# Patient Record
Sex: Female | Born: 1940
Health system: Southern US, Community
[De-identification: ages and names within clinical notes are randomized; demographics above are authoritative.]

## PROBLEM LIST (undated history)

## (undated) DIAGNOSIS — K219 Gastro-esophageal reflux disease without esophagitis: Secondary | ICD-10-CM

## (undated) DIAGNOSIS — I499 Cardiac arrhythmia, unspecified: Secondary | ICD-10-CM

## (undated) DIAGNOSIS — I350 Nonrheumatic aortic (valve) stenosis: Secondary | ICD-10-CM

## (undated) DIAGNOSIS — R7303 Prediabetes: Secondary | ICD-10-CM

## (undated) DIAGNOSIS — R112 Nausea with vomiting, unspecified: Secondary | ICD-10-CM

## (undated) DIAGNOSIS — I4819 Other persistent atrial fibrillation: Secondary | ICD-10-CM

## (undated) DIAGNOSIS — R011 Cardiac murmur, unspecified: Secondary | ICD-10-CM

## (undated) DIAGNOSIS — E039 Hypothyroidism, unspecified: Secondary | ICD-10-CM

## (undated) DIAGNOSIS — I10 Essential (primary) hypertension: Secondary | ICD-10-CM

## (undated) DIAGNOSIS — Z9889 Other specified postprocedural states: Secondary | ICD-10-CM

## (undated) DIAGNOSIS — I4891 Unspecified atrial fibrillation: Secondary | ICD-10-CM

## (undated) DIAGNOSIS — E079 Disorder of thyroid, unspecified: Secondary | ICD-10-CM

## (undated) DIAGNOSIS — T8859XA Other complications of anesthesia, initial encounter: Secondary | ICD-10-CM

## (undated) DIAGNOSIS — K449 Diaphragmatic hernia without obstruction or gangrene: Secondary | ICD-10-CM

## (undated) DIAGNOSIS — R519 Headache, unspecified: Secondary | ICD-10-CM

## (undated) DIAGNOSIS — M1990 Unspecified osteoarthritis, unspecified site: Secondary | ICD-10-CM

## (undated) DIAGNOSIS — R0600 Dyspnea, unspecified: Secondary | ICD-10-CM

## (undated) HISTORY — DX: Essential (primary) hypertension: I10

## (undated) HISTORY — PX: CARDIAC CATHETERIZATION: SHX172

## (undated) HISTORY — DX: Disorder of thyroid, unspecified: E07.9

## (undated) HISTORY — PX: TOE SURGERY: SHX1073

## (undated) HISTORY — DX: Unspecified atrial fibrillation: I48.91

## (undated) HISTORY — PX: TUBAL LIGATION: SHX77

## (undated) HISTORY — PX: EYE SURGERY: SHX253

---

## 1984-03-21 HISTORY — PX: TUBAL LIGATION: SHX77

## 1998-07-14 ENCOUNTER — Other Ambulatory Visit: Admission: RE | Admit: 1998-07-14 | Discharge: 1998-07-14 | Payer: Self-pay | Admitting: Family Medicine

## 1999-01-14 ENCOUNTER — Encounter: Admission: RE | Admit: 1999-01-14 | Discharge: 1999-01-14 | Payer: Self-pay | Admitting: Family Medicine

## 1999-01-14 ENCOUNTER — Encounter: Payer: Self-pay | Admitting: Obstetrics and Gynecology

## 1999-07-21 ENCOUNTER — Other Ambulatory Visit: Admission: RE | Admit: 1999-07-21 | Discharge: 1999-07-21 | Payer: Self-pay | Admitting: Family Medicine

## 2000-01-18 ENCOUNTER — Encounter: Admission: RE | Admit: 2000-01-18 | Discharge: 2000-01-18 | Payer: Self-pay | Admitting: Family Medicine

## 2000-01-18 ENCOUNTER — Encounter: Payer: Self-pay | Admitting: Family Medicine

## 2001-01-18 ENCOUNTER — Encounter: Payer: Self-pay | Admitting: Family Medicine

## 2001-01-18 ENCOUNTER — Encounter: Admission: RE | Admit: 2001-01-18 | Discharge: 2001-01-18 | Payer: Self-pay | Admitting: Family Medicine

## 2001-11-28 ENCOUNTER — Other Ambulatory Visit: Admission: RE | Admit: 2001-11-28 | Discharge: 2001-11-28 | Payer: Self-pay | Admitting: Family Medicine

## 2002-05-20 ENCOUNTER — Encounter: Payer: Self-pay | Admitting: *Deleted

## 2002-05-20 ENCOUNTER — Encounter: Admission: RE | Admit: 2002-05-20 | Discharge: 2002-05-20 | Payer: Self-pay | Admitting: *Deleted

## 2003-01-09 ENCOUNTER — Other Ambulatory Visit: Admission: RE | Admit: 2003-01-09 | Discharge: 2003-01-09 | Payer: Self-pay | Admitting: Family Medicine

## 2003-05-28 ENCOUNTER — Encounter: Admission: RE | Admit: 2003-05-28 | Discharge: 2003-05-28 | Payer: Self-pay | Admitting: Family Medicine

## 2004-01-22 ENCOUNTER — Other Ambulatory Visit: Admission: RE | Admit: 2004-01-22 | Discharge: 2004-01-22 | Payer: Self-pay | Admitting: Family Medicine

## 2004-06-21 ENCOUNTER — Encounter: Admission: RE | Admit: 2004-06-21 | Discharge: 2004-06-21 | Payer: Self-pay | Admitting: Family Medicine

## 2005-06-23 ENCOUNTER — Encounter: Admission: RE | Admit: 2005-06-23 | Discharge: 2005-06-23 | Payer: Self-pay | Admitting: Family Medicine

## 2006-09-21 ENCOUNTER — Encounter: Admission: RE | Admit: 2006-09-21 | Discharge: 2006-09-21 | Payer: Self-pay | Admitting: Family Medicine

## 2006-09-29 ENCOUNTER — Encounter: Admission: RE | Admit: 2006-09-29 | Discharge: 2006-09-29 | Payer: Self-pay | Admitting: Family Medicine

## 2007-10-01 ENCOUNTER — Encounter: Admission: RE | Admit: 2007-10-01 | Discharge: 2007-10-01 | Payer: Self-pay

## 2008-06-05 LAB — HM COLONOSCOPY: HM Colonoscopy: NORMAL

## 2008-09-16 ENCOUNTER — Encounter: Admission: RE | Admit: 2008-09-16 | Discharge: 2008-09-16 | Payer: Self-pay | Admitting: Orthopedic Surgery

## 2008-09-29 ENCOUNTER — Encounter: Admission: RE | Admit: 2008-09-29 | Discharge: 2008-12-18 | Payer: Self-pay | Admitting: Orthopedic Surgery

## 2008-11-20 ENCOUNTER — Encounter: Admission: RE | Admit: 2008-11-20 | Discharge: 2008-11-20 | Payer: Self-pay | Admitting: Family Medicine

## 2009-12-28 ENCOUNTER — Encounter: Admission: RE | Admit: 2009-12-28 | Discharge: 2009-12-28 | Payer: Self-pay | Admitting: Family Medicine

## 2010-04-11 ENCOUNTER — Encounter: Payer: Self-pay | Admitting: Family Medicine

## 2010-06-11 DIAGNOSIS — E039 Hypothyroidism, unspecified: Secondary | ICD-10-CM | POA: Insufficient documentation

## 2010-06-11 DIAGNOSIS — Z780 Asymptomatic menopausal state: Secondary | ICD-10-CM | POA: Insufficient documentation

## 2010-06-11 DIAGNOSIS — K5790 Diverticulosis of intestine, part unspecified, without perforation or abscess without bleeding: Secondary | ICD-10-CM | POA: Insufficient documentation

## 2011-01-28 ENCOUNTER — Other Ambulatory Visit: Payer: Self-pay | Admitting: Family Medicine

## 2011-01-28 ENCOUNTER — Other Ambulatory Visit: Payer: Self-pay | Admitting: Nurse Practitioner

## 2011-01-28 DIAGNOSIS — Z1231 Encounter for screening mammogram for malignant neoplasm of breast: Secondary | ICD-10-CM

## 2011-02-11 ENCOUNTER — Ambulatory Visit
Admission: RE | Admit: 2011-02-11 | Discharge: 2011-02-11 | Disposition: A | Discharge status: Home / Self Care | Payer: Medicare Other | Source: Ambulatory Visit | Attending: Family Medicine | Admitting: Family Medicine

## 2011-02-11 DIAGNOSIS — Z1231 Encounter for screening mammogram for malignant neoplasm of breast: Secondary | ICD-10-CM

## 2011-04-07 DIAGNOSIS — L259 Unspecified contact dermatitis, unspecified cause: Secondary | ICD-10-CM | POA: Diagnosis not present

## 2011-11-17 DIAGNOSIS — Z961 Presence of intraocular lens: Secondary | ICD-10-CM | POA: Diagnosis not present

## 2011-11-17 DIAGNOSIS — H43819 Vitreous degeneration, unspecified eye: Secondary | ICD-10-CM | POA: Diagnosis not present

## 2011-11-17 DIAGNOSIS — D3130 Benign neoplasm of unspecified choroid: Secondary | ICD-10-CM | POA: Diagnosis not present

## 2011-11-17 DIAGNOSIS — H3530 Unspecified macular degeneration: Secondary | ICD-10-CM | POA: Diagnosis not present

## 2012-02-29 DIAGNOSIS — E039 Hypothyroidism, unspecified: Secondary | ICD-10-CM | POA: Diagnosis not present

## 2012-02-29 DIAGNOSIS — K219 Gastro-esophageal reflux disease without esophagitis: Secondary | ICD-10-CM | POA: Diagnosis not present

## 2012-02-29 DIAGNOSIS — E559 Vitamin D deficiency, unspecified: Secondary | ICD-10-CM | POA: Diagnosis not present

## 2012-02-29 DIAGNOSIS — I10 Essential (primary) hypertension: Secondary | ICD-10-CM | POA: Diagnosis not present

## 2012-05-07 DIAGNOSIS — M25569 Pain in unspecified knee: Secondary | ICD-10-CM | POA: Diagnosis not present

## 2012-05-07 DIAGNOSIS — IMO0002 Reserved for concepts with insufficient information to code with codable children: Secondary | ICD-10-CM | POA: Diagnosis not present

## 2012-06-13 ENCOUNTER — Other Ambulatory Visit: Payer: Self-pay

## 2012-06-13 DIAGNOSIS — Z1231 Encounter for screening mammogram for malignant neoplasm of breast: Secondary | ICD-10-CM

## 2012-07-06 ENCOUNTER — Ambulatory Visit
Admission: RE | Admit: 2012-07-06 | Discharge: 2012-07-06 | Disposition: A | Discharge status: Home / Self Care | Payer: Medicare Other | Source: Ambulatory Visit

## 2012-07-06 DIAGNOSIS — Z1231 Encounter for screening mammogram for malignant neoplasm of breast: Secondary | ICD-10-CM | POA: Diagnosis not present

## 2012-11-29 DIAGNOSIS — D3130 Benign neoplasm of unspecified choroid: Secondary | ICD-10-CM | POA: Diagnosis not present

## 2012-11-29 DIAGNOSIS — H02839 Dermatochalasis of unspecified eye, unspecified eyelid: Secondary | ICD-10-CM | POA: Diagnosis not present

## 2012-11-29 DIAGNOSIS — H3530 Unspecified macular degeneration: Secondary | ICD-10-CM | POA: Diagnosis not present

## 2012-11-29 DIAGNOSIS — Z961 Presence of intraocular lens: Secondary | ICD-10-CM | POA: Diagnosis not present

## 2013-02-24 ENCOUNTER — Other Ambulatory Visit: Payer: Self-pay | Admitting: Family Medicine

## 2013-02-26 ENCOUNTER — Telehealth: Payer: Self-pay | Admitting: General Practice

## 2013-03-05 ENCOUNTER — Telehealth: Payer: Self-pay | Admitting: General Practice

## 2013-03-05 ENCOUNTER — Encounter (INDEPENDENT_AMBULATORY_CARE_PROVIDER_SITE_OTHER): Payer: Self-pay

## 2013-03-05 ENCOUNTER — Encounter: Payer: Self-pay | Admitting: General Practice

## 2013-03-05 ENCOUNTER — Ambulatory Visit (INDEPENDENT_AMBULATORY_CARE_PROVIDER_SITE_OTHER): Payer: Medicare Other | Admitting: General Practice

## 2013-03-05 VITALS — BP 145/77 | HR 89 | Temp 97.2°F | Ht 64.0 in | Wt 172.0 lb

## 2013-03-05 DIAGNOSIS — E039 Hypothyroidism, unspecified: Secondary | ICD-10-CM | POA: Diagnosis not present

## 2013-03-05 DIAGNOSIS — I10 Essential (primary) hypertension: Secondary | ICD-10-CM

## 2013-03-05 DIAGNOSIS — H60543 Acute eczematoid otitis externa, bilateral: Secondary | ICD-10-CM

## 2013-03-05 DIAGNOSIS — H60509 Unspecified acute noninfective otitis externa, unspecified ear: Secondary | ICD-10-CM

## 2013-03-05 DIAGNOSIS — Z09 Encounter for follow-up examination after completed treatment for conditions other than malignant neoplasm: Secondary | ICD-10-CM

## 2013-03-05 DIAGNOSIS — K219 Gastro-esophageal reflux disease without esophagitis: Secondary | ICD-10-CM

## 2013-03-05 LAB — POCT CBC
HCT, POC: 43.7 % (ref 37.7–47.9)
Hemoglobin: 14.7 g/dL (ref 12.2–16.2)
Lymph, poc: 2.1 (ref 0.6–3.4)
MCV: 88.1 fL (ref 80–97)
POC Granulocyte: 5.7 (ref 2–6.9)
Platelet Count, POC: 350 10*3/uL (ref 142–424)
RDW, POC: 12.9 %

## 2013-03-05 MED ORDER — BENAZEPRIL-HYDROCHLOROTHIAZIDE 20-12.5 MG PO TABS: 1.0000 | ORAL_TABLET | Freq: Every day | ORAL | Status: DC

## 2013-03-05 MED ORDER — MOMETASONE FUROATE 0.1 % EX CREA: 1.0000 "application " | TOPICAL_CREAM | Freq: Every day | CUTANEOUS | Status: DC

## 2013-03-05 MED ORDER — RANITIDINE HCL 150 MG PO TABS: 150.0000 mg | ORAL_TABLET | Freq: Two times a day (BID) | ORAL | Status: DC

## 2013-03-05 NOTE — Telephone Encounter (Signed)
Please advise 

## 2013-03-05 NOTE — Patient Instructions (Signed)

## 2013-03-05 NOTE — Progress Notes (Signed)
   Subjective:    Patient ID: Autumn Moyer, female    DOB: 22-Jan-1941, 72 y.o.   MRN: [id number]  HPI Patient presents today for chronic health follow up. She has a history of hypothyroidism, hypertension, gerd, and eczema. She reports taking medications as prescribed. Reports eating a regular diet and denies exercise. Reports having a history of hyperlipidemia, but refuses to take any lipid lowering medications (due to side effects). Refuses to have lipid panel.     Review of Systems  Constitutional: Negative for fever and chills.  Respiratory: Negative for chest tightness and shortness of breath.   Cardiovascular: Negative for chest pain and palpitations.  Gastrointestinal: Negative for nausea, vomiting, abdominal pain, diarrhea, constipation and blood in stool.  Genitourinary: Negative for dysuria and difficulty urinating.  Musculoskeletal:       Arthritic pain  Neurological: Negative for dizziness, weakness and headaches.       Objective:   Physical Exam  Constitutional: She is oriented to person, place, and time. She appears well-developed and well-nourished.  HENT:  Head: Normocephalic and atraumatic.  Right Ear: External ear normal.  Left Ear: External ear normal.  Nose: Nose normal.  Mouth/Throat: Oropharynx is clear and moist.  Eyes: EOM are normal. Pupils are equal, round, and reactive to light.  Neck: Normal range of motion. Neck supple. No thyromegaly present.  Cardiovascular: Normal rate, regular rhythm and normal heart sounds.   Pulmonary/Chest: Effort normal and breath sounds normal. No respiratory distress. She exhibits no tenderness.  Abdominal: Soft. Bowel sounds are normal. She exhibits no distension. There is no tenderness.  Lymphadenopathy:    She has no cervical adenopathy.  Neurological: She is alert and oriented to person, place, and time.  Skin: Skin is warm and dry.  Psychiatric: She has a normal mood and affect.          Assessment & Plan:  1.  Eczema of external ear, bilateral  - mometasone (ELOCON) 0.1 % cream; Apply 1 application topically daily. Apply to affected area  Dispense: 45 g; Refill: 2  2. GERD (gastroesophageal reflux disease)  - ranitidine (ZANTAC) 150 MG tablet; Take 1 tablet (150 mg total) by mouth 2 (two) times daily.  Dispense: 60 tablet; Refill: 6  3. Hypertension  - benazepril-hydrochlorthiazide (LOTENSIN HCT) 20-12.5 MG per tablet; Take 1 tablet by mouth daily.  Dispense: 30 tablet; Refill: 3 - CMP14+EGFR  4. Hypothyroidism  - Thyroid Panel With TSH  5. Follow-up exam, 3-6 months since previous exam  - POCT CBC -Continue all current medications Labs pending F/u in 3 months Discussed benefits of regular exercise and healthy eating Patient verbalized understanding Coralie Keens, FNP-C

## 2013-03-06 LAB — CMP14+EGFR
Albumin/Globulin Ratio: 1.7 (ref 1.1–2.5)
Albumin: 4.7 g/dL (ref 3.5–4.8)
BUN/Creatinine Ratio: 19 (ref 11–26)
CO2: 24 mmol/L (ref 18–29)
Calcium: 9.9 mg/dL (ref 8.6–10.2)
Chloride: 99 mmol/L (ref 97–108)
Creatinine, Ser: 0.85 mg/dL (ref 0.57–1.00)
GFR calc Af Amer: 80 mL/min/{1.73_m2} (ref >59)
Potassium: 4.7 mmol/L (ref 3.5–5.2)
Total Bilirubin: 0.8 mg/dL (ref 0.0–1.2)

## 2013-03-06 LAB — THYROID PANEL WITH TSH: T4, Total: 7.3 ug/dL (ref 4.5–12.0)

## 2013-03-08 ENCOUNTER — Other Ambulatory Visit: Payer: Self-pay | Admitting: General Practice

## 2013-03-08 DIAGNOSIS — H60543 Acute eczematoid otitis externa, bilateral: Secondary | ICD-10-CM

## 2013-03-08 MED ORDER — MOMETASONE FUROATE 0.1 % EX CREA: 1.0000 "application " | TOPICAL_CREAM | Freq: Every day | CUTANEOUS | Status: DC

## 2013-03-08 NOTE — Telephone Encounter (Signed)
Script sent to pharmacy.

## 2013-03-11 ENCOUNTER — Telehealth: Payer: Self-pay | Admitting: General Practice

## 2013-03-12 NOTE — Telephone Encounter (Signed)
Labs resulted.

## 2013-03-13 NOTE — Telephone Encounter (Signed)
Called in thyroid meds to [facility],  Six month supply.  Pt wants elocon, non generic, but insurance will not pay.   Cost would be $112 for brand name. Patient aware.

## 2013-03-13 NOTE — Telephone Encounter (Signed)
Message copied by Almeta Monas on Wed Mar 13, 2013  9:10 AM ------      Message from: Coralie Keens      Created: Tue Mar 12, 2013  3:52 PM       Please inform all labs within normal limits. ------

## 2013-05-07 DIAGNOSIS — R209 Unspecified disturbances of skin sensation: Secondary | ICD-10-CM | POA: Diagnosis not present

## 2013-05-07 DIAGNOSIS — L570 Actinic keratosis: Secondary | ICD-10-CM | POA: Diagnosis not present

## 2013-05-07 DIAGNOSIS — D485 Neoplasm of uncertain behavior of skin: Secondary | ICD-10-CM | POA: Diagnosis not present

## 2013-05-07 DIAGNOSIS — D239 Other benign neoplasm of skin, unspecified: Secondary | ICD-10-CM | POA: Diagnosis not present

## 2013-06-16 DIAGNOSIS — H579 Unspecified disorder of eye and adnexa: Secondary | ICD-10-CM | POA: Diagnosis not present

## 2013-06-26 ENCOUNTER — Other Ambulatory Visit: Payer: Self-pay | Admitting: *Deleted

## 2013-06-26 DIAGNOSIS — I10 Essential (primary) hypertension: Secondary | ICD-10-CM

## 2013-06-26 MED ORDER — BENAZEPRIL-HYDROCHLOROTHIAZIDE 20-12.5 MG PO TABS: 1.0000 | ORAL_TABLET | Freq: Every day | ORAL | Status: DC

## 2013-06-26 NOTE — Telephone Encounter (Signed)
Last ov 12/14. 

## 2013-08-05 ENCOUNTER — Other Ambulatory Visit: Payer: Self-pay

## 2013-08-05 DIAGNOSIS — Z1231 Encounter for screening mammogram for malignant neoplasm of breast: Secondary | ICD-10-CM

## 2013-08-06 ENCOUNTER — Ambulatory Visit
Admission: RE | Admit: 2013-08-06 | Discharge: 2013-08-06 | Disposition: A | Discharge status: Home / Self Care | Payer: Medicare Other | Source: Ambulatory Visit

## 2013-08-06 DIAGNOSIS — Z1231 Encounter for screening mammogram for malignant neoplasm of breast: Secondary | ICD-10-CM

## 2013-09-02 ENCOUNTER — Other Ambulatory Visit: Payer: Self-pay | Admitting: General Practice

## 2013-10-21 ENCOUNTER — Other Ambulatory Visit: Payer: Self-pay | Admitting: General Practice

## 2013-10-23 DIAGNOSIS — C4492 Squamous cell carcinoma of skin, unspecified: Secondary | ICD-10-CM

## 2013-10-23 DIAGNOSIS — L821 Other seborrheic keratosis: Secondary | ICD-10-CM | POA: Diagnosis not present

## 2013-10-23 DIAGNOSIS — D485 Neoplasm of uncertain behavior of skin: Secondary | ICD-10-CM | POA: Diagnosis not present

## 2013-10-23 DIAGNOSIS — K140 Glossitis: Secondary | ICD-10-CM | POA: Diagnosis not present

## 2013-10-23 DIAGNOSIS — D0460 Carcinoma in situ of skin of unspecified upper limb, including shoulder: Secondary | ICD-10-CM | POA: Diagnosis not present

## 2013-10-23 HISTORY — DX: Squamous cell carcinoma of skin, unspecified: C44.92

## 2013-10-24 DIAGNOSIS — H1045 Other chronic allergic conjunctivitis: Secondary | ICD-10-CM | POA: Diagnosis not present

## 2013-10-28 DIAGNOSIS — J029 Acute pharyngitis, unspecified: Secondary | ICD-10-CM | POA: Diagnosis not present

## 2013-11-07 DIAGNOSIS — D0460 Carcinoma in situ of skin of unspecified upper limb, including shoulder: Secondary | ICD-10-CM | POA: Diagnosis not present

## 2013-11-13 DIAGNOSIS — T148XXA Other injury of unspecified body region, initial encounter: Secondary | ICD-10-CM | POA: Diagnosis not present

## 2013-11-13 DIAGNOSIS — L089 Local infection of the skin and subcutaneous tissue, unspecified: Secondary | ICD-10-CM | POA: Diagnosis not present

## 2013-11-15 ENCOUNTER — Ambulatory Visit (INDEPENDENT_AMBULATORY_CARE_PROVIDER_SITE_OTHER): Payer: Medicare Other | Admitting: Family Medicine

## 2013-11-15 VITALS — BP 128/74 | HR 86 | Temp 98.1°F | Ht 64.0 in | Wt 178.4 lb

## 2013-11-15 DIAGNOSIS — K2100 Gastro-esophageal reflux disease with esophagitis, without bleeding: Secondary | ICD-10-CM | POA: Diagnosis not present

## 2013-11-15 DIAGNOSIS — E039 Hypothyroidism, unspecified: Secondary | ICD-10-CM | POA: Diagnosis not present

## 2013-11-15 DIAGNOSIS — I10 Essential (primary) hypertension: Secondary | ICD-10-CM

## 2013-11-15 DIAGNOSIS — K219 Gastro-esophageal reflux disease without esophagitis: Secondary | ICD-10-CM | POA: Insufficient documentation

## 2013-11-15 MED ORDER — BENAZEPRIL-HYDROCHLOROTHIAZIDE 20-12.5 MG PO TABS: ORAL_TABLET | ORAL | Status: DC

## 2013-11-15 MED ORDER — RANITIDINE HCL 150 MG PO TABS: ORAL_TABLET | ORAL | Status: DC

## 2013-11-15 MED ORDER — LEVOTHYROXINE SODIUM 100 MCG PO TABS: ORAL_TABLET | ORAL | Status: DC

## 2013-11-15 NOTE — Patient Instructions (Signed)
You are doing great overall.  Please continue taking your medications as prescribed and try to find your trigger foods for your reflux. Have a blessed weekend

## 2013-11-15 NOTE — Assessment & Plan Note (Signed)
Minimal esophagitis and no signs of significant esophageal disease.  No need for scope Continue H2 blocker prn, pt to try to better identify triggers F/u prn

## 2013-11-15 NOTE — Assessment & Plan Note (Signed)
Well controlled.  No sign of hypotension BMET today Refill Lisinopril and HCTZ Consider decreasing in future if remains at current level - per PCP

## 2013-11-15 NOTE — Assessment & Plan Note (Signed)
TSH today Will refill thyroxine base on results FUll thyroid panel in 02/2013 nml

## 2013-11-15 NOTE — Progress Notes (Signed)
Autumn Moyer is a 73 y.o. female who presents to [facility] today for medication refills  Thyroid: Denies CP, palpitations, dry skin, cold or heat intolerance, neck swelling. Taking levothyroxine  Ranitidine: takes nightly at supper. Occasionally takes BID as prescribed. occasinoally will try not taking it at all. Symptoms come and go. occasinally at night when lying down and after some meals. Unsure of dietary triggers. Denies dysphagia, unintentional wt loss, fevers, hematemesis or hematochezia/melana  HTN: takes lotensinHCT daily. Denies CP, palpitations, SOB, syncope, lightheaded.    The following portions of the patient's history were reviewed and updated as appropriate: allergies, current medications, past medical history, family and social history, and problem list.  Patient is a nonsmoker.   Past Medical History  Diagnosis Date  . Hypertension   . Thyroid disease     ROS as above otherwise neg.    Medications reviewed. Current Outpatient Prescriptions  Medication Sig Dispense Refill  . benazepril-hydrochlorthiazide (LOTENSIN HCT) 20-12.5 MG per tablet TAKE ONE TABLET BY MOUTH ONCE DAILY  90 tablet  3  . Iron Combinations (IRON COMPLEX PO) Take by mouth.      . levothyroxine (SYNTHROID, LEVOTHROID) 100 MCG tablet TAKE ONE TABLET BY MOUTH ONCE DAILY IN THE MORNING ON  AN  EMPTY  STOMACH  90 tablet  1  . mometasone (ELOCON) 0.1 % cream Apply 1 application topically daily.  45 g  0  . Multiple Vitamins-Minerals (ICAPS MV PO) Take by mouth.      . pseudoephedrine (DECONGESTANT 12HOUR MAX ST) 120 MG 12 hr tablet Take 120 mg by mouth 2 (two) times daily.      . ranitidine (ZANTAC) 150 MG tablet TAKE ONE TABLET BY MOUTH TWICE DAILY AS NEEDED  60 tablet  6  . Triprolidine-Pseudoephedrine (ANTIHISTAMINE PO) Take 10 mg by mouth daily.      . mupirocin ointment (BACTROBAN) 2 %        No current facility-administered medications for this visit.    Exam: BP 128/74  Pulse 86  Temp(Src)  98.1 F (36.7 C) (Oral)  Ht 5\' 4"  (1.626 m)  Wt 178 lb 6.4 oz (80.922 kg)  BMI 30.61 kg/m2 Gen: Well NAD HEENT: EOMI,  MMM Lungs: CTABL Nl WOB Heart: RRR no MRG Abd: NABS, NT, ND Exts: Non edematous BL  LE, warm and well perfused.   No results found for this or any previous visit (from the past 72 hour(s)).  A/P (as seen in Problem list)  Essential hypertension, benign Well controlled.  No sign of hypotension BMET today Refill Lisinopril and HCTZ Consider decreasing in future if remains at current level - per PCP  Hypothyroid TSH today Will refill thyroxine base on results FUll thyroid panel in 02/2013 nml  Esophageal reflux Minimal esophagitis and no signs of significant esophageal disease.  No need for scope Continue H2 blocker prn, pt to try to better identify triggers F/u prn

## 2013-11-16 LAB — BASIC METABOLIC PANEL
BUN/Creatinine Ratio: 18 (ref 11–26)
BUN: 17 mg/dL (ref 8–27)
CO2: 24 mmol/L (ref 18–29)
Calcium: 9.8 mg/dL (ref 8.7–10.3)
Chloride: 101 mmol/L (ref 97–108)
Creatinine, Ser: 0.93 mg/dL (ref 0.57–1.00)
GFR calc Af Amer: 72 mL/min/{1.73_m2} (ref >59)
GFR, EST NON AFRICAN AMERICAN: 62 mL/min/{1.73_m2} (ref >59)
Glucose: 103 mg/dL — ABNORMAL HIGH (ref 65–99)
POTASSIUM: 4.6 mmol/L (ref 3.5–5.2)
SODIUM: 142 mmol/L (ref 134–144)

## 2013-11-16 LAB — TSH: TSH: 0.497 u[IU]/mL (ref 0.450–4.500)

## 2013-12-11 DIAGNOSIS — L570 Actinic keratosis: Secondary | ICD-10-CM | POA: Diagnosis not present

## 2013-12-11 DIAGNOSIS — L819 Disorder of pigmentation, unspecified: Secondary | ICD-10-CM | POA: Diagnosis not present

## 2013-12-11 DIAGNOSIS — D485 Neoplasm of uncertain behavior of skin: Secondary | ICD-10-CM | POA: Diagnosis not present

## 2014-03-26 DIAGNOSIS — L570 Actinic keratosis: Secondary | ICD-10-CM | POA: Diagnosis not present

## 2014-03-26 DIAGNOSIS — L910 Hypertrophic scar: Secondary | ICD-10-CM | POA: Diagnosis not present

## 2014-06-11 DIAGNOSIS — H3531 Nonexudative age-related macular degeneration: Secondary | ICD-10-CM | POA: Diagnosis not present

## 2014-06-11 DIAGNOSIS — D3131 Benign neoplasm of right choroid: Secondary | ICD-10-CM | POA: Diagnosis not present

## 2014-06-11 DIAGNOSIS — H52203 Unspecified astigmatism, bilateral: Secondary | ICD-10-CM | POA: Diagnosis not present

## 2014-06-11 DIAGNOSIS — H10413 Chronic giant papillary conjunctivitis, bilateral: Secondary | ICD-10-CM | POA: Diagnosis not present

## 2014-08-05 ENCOUNTER — Other Ambulatory Visit: Payer: Self-pay

## 2014-08-05 DIAGNOSIS — Z1231 Encounter for screening mammogram for malignant neoplasm of breast: Secondary | ICD-10-CM

## 2014-08-15 ENCOUNTER — Ambulatory Visit (INDEPENDENT_AMBULATORY_CARE_PROVIDER_SITE_OTHER): Payer: Medicare Other | Admitting: Physician Assistant

## 2014-08-15 ENCOUNTER — Encounter: Payer: Self-pay | Admitting: Physician Assistant

## 2014-08-15 VITALS — BP 156/81 | HR 92 | Temp 97.4°F | Ht 64.0 in | Wt 179.0 lb

## 2014-08-15 DIAGNOSIS — E039 Hypothyroidism, unspecified: Secondary | ICD-10-CM | POA: Diagnosis not present

## 2014-08-15 DIAGNOSIS — K2100 Gastro-esophageal reflux disease with esophagitis, without bleeding: Secondary | ICD-10-CM

## 2014-08-15 DIAGNOSIS — Z0000 Encounter for general adult medical examination without abnormal findings: Secondary | ICD-10-CM

## 2014-08-15 DIAGNOSIS — R010 Benign and innocent cardiac murmurs: Secondary | ICD-10-CM

## 2014-08-15 DIAGNOSIS — Z13220 Encounter for screening for lipoid disorders: Secondary | ICD-10-CM

## 2014-08-15 DIAGNOSIS — R011 Cardiac murmur, unspecified: Secondary | ICD-10-CM

## 2014-08-15 DIAGNOSIS — I10 Essential (primary) hypertension: Secondary | ICD-10-CM

## 2014-08-15 LAB — POCT CBC
Granulocyte percent: 57.8 %G (ref 37–80)
HCT, POC: 42.1 % (ref 37.7–47.9)
Hemoglobin: 13.9 g/dL (ref 12.2–16.2)
LYMPH, POC: 2.4 (ref 0.6–3.4)
MCH: 28.7 pg (ref 27–31.2)
MCHC: 33 g/dL (ref 31.8–35.4)
MCV: 87 fL (ref 80–97)
MPV: 7.4 fL (ref 0–99.8)
POC Granulocyte: 4.3 (ref 2–6.9)
POC LYMPH PERCENT: 32.2 %L (ref 10–50)
Platelet Count, POC: 337 10*3/uL (ref 142–424)
RBC: 4.84 M/uL (ref 4.04–5.48)
RDW, POC: 12.6 %
WBC: 7.4 10*3/uL (ref 4.6–10.2)

## 2014-08-15 MED ORDER — RANITIDINE HCL 150 MG PO TABS: ORAL_TABLET | ORAL | Status: DC

## 2014-08-15 MED ORDER — LEVOTHYROXINE SODIUM 100 MCG PO TABS: ORAL_TABLET | ORAL | Status: DC

## 2014-08-15 MED ORDER — BENAZEPRIL-HYDROCHLOROTHIAZIDE 20-12.5 MG PO TABS: ORAL_TABLET | ORAL | Status: DC

## 2014-08-15 NOTE — Progress Notes (Signed)
   Subjective:    Patient ID: Autumn Moyer, female    DOB: 1940-09-10, 74 y.o.   MRN: [id number]  HPI73 y/o female needs refills for medications.     Review of Systems  Constitutional: Negative.   Respiratory: Positive for shortness of breath (with activity ).   Cardiovascular: Negative.   Endocrine: Positive for heat intolerance. Negative for polydipsia, polyphagia and polyuria.  Genitourinary: Negative.   Musculoskeletal: Negative.   Neurological: Negative.   Hematological: Negative.   Psychiatric/Behavioral: Negative.        Objective:   Physical Exam  Constitutional: She appears well-developed and well-nourished.  HENT:  Head: Normocephalic.  Neck: No thyromegaly present.  Cardiovascular: Normal rate, regular rhythm and intact distal pulses.  Exam reveals no gallop and no friction rub.   Murmur (consistent , systolic murmur noted on exam. Patient has no history of this ) heard. Pulmonary/Chest: Effort normal and breath sounds normal. No respiratory distress. She has no wheezes.  Musculoskeletal: Normal range of motion. She exhibits no edema or tenderness.  Psychiatric: She has a normal mood and affect. Her behavior is normal. Judgment and thought content normal.  Nursing note and vitals reviewed.         Assessment & Plan:  1. Essential hypertension, benign  - POCT CBC - CMP14+EGFR - benazepril-hydrochlorthiazide (LOTENSIN HCT) 20-12.5 MG per tablet; TAKE ONE TABLET BY MOUTH ONCE DAILY  Dispense: 90 tablet; Refill: 5  2. Hypothyroidism, unspecified hypothyroidism type  - Thyroid Panel With TSH - levothyroxine (SYNTHROID, LEVOTHROID) 100 MCG tablet; TAKE ONE TABLET BY MOUTH ONCE DAILY IN THE MORNING ON  AN  EMPTY  STOMACH  Dispense: 90 tablet; Refill: 1  3. Gastroesophageal reflux disease with esophagitis  - ranitidine (ZANTAC) 150 MG tablet; TAKE ONE TABLET BY MOUTH TWICE DAILY AS NEEDED  Dispense: 60 tablet; Refill: 5  Patient does not want lipid screening  because she is not going to take medication regardless    6. Undiagnosed cardiac murmurs - Patient has no history of this dx. Very audible in all areas of auscultation on anterior chest and more prominent with leaning forward. Needs further eval  - Ambulatory referral to Cardiology   Continue all meds Labs pending Health Maintenance reviewed Diet and exercise encouraged RTO 6 months   Tammala Weider A. Benjamin Stain PA-C

## 2014-08-16 LAB — CMP14+EGFR
A/G RATIO: 1.6 (ref 1.1–2.5)
ALK PHOS: 61 IU/L (ref 39–117)
ALT: 15 IU/L (ref 0–32)
AST: 20 IU/L (ref 0–40)
Albumin: 4.4 g/dL (ref 3.5–4.8)
BILIRUBIN TOTAL: 0.7 mg/dL (ref 0.0–1.2)
BUN / CREAT RATIO: 19 (ref 11–26)
BUN: 17 mg/dL (ref 8–27)
CHLORIDE: 99 mmol/L (ref 97–108)
CO2: 24 mmol/L (ref 18–29)
CREATININE: 0.91 mg/dL (ref 0.57–1.00)
Calcium: 9.6 mg/dL (ref 8.7–10.3)
GFR calc Af Amer: 73 mL/min/{1.73_m2} (ref >59)
GFR calc non Af Amer: 64 mL/min/{1.73_m2} (ref >59)
GLUCOSE: 105 mg/dL — AB (ref 65–99)
Globulin, Total: 2.7 g/dL (ref 1.5–4.5)
Potassium: 4.5 mmol/L (ref 3.5–5.2)
SODIUM: 139 mmol/L (ref 134–144)
TOTAL PROTEIN: 7.1 g/dL (ref 6.0–8.5)

## 2014-08-16 LAB — THYROID PANEL WITH TSH
FREE THYROXINE INDEX: 3.1 (ref 1.2–4.9)
T3 UPTAKE RATIO: 31 % (ref 24–39)
T4, Total: 9.9 ug/dL (ref 4.5–12.0)
TSH: 0.544 u[IU]/mL (ref 0.450–4.500)

## 2014-08-26 ENCOUNTER — Telehealth: Payer: Self-pay | Admitting: Physician Assistant

## 2014-08-28 NOTE — Telephone Encounter (Signed)
Talked to patient. This was not meant for me. The confusion was about  Her appointment times at [facility], which has been resolved. Greyson Peavy A. Benjamin Stain PA-C

## 2014-09-05 ENCOUNTER — Ambulatory Visit (INDEPENDENT_AMBULATORY_CARE_PROVIDER_SITE_OTHER): Payer: Medicare Other | Admitting: Cardiology

## 2014-09-05 ENCOUNTER — Encounter: Payer: Self-pay | Admitting: Cardiology

## 2014-09-05 VITALS — BP 158/92 | HR 94 | Ht 64.0 in | Wt 180.0 lb

## 2014-09-05 DIAGNOSIS — R011 Cardiac murmur, unspecified: Secondary | ICD-10-CM | POA: Insufficient documentation

## 2014-09-05 DIAGNOSIS — I10 Essential (primary) hypertension: Secondary | ICD-10-CM | POA: Diagnosis not present

## 2014-09-05 NOTE — Progress Notes (Signed)
Cardiology Office Note   Date:  09/05/2014   ID:  VALETA PAZ, DOB 1940/09/02, MRN [id number]  PCP:  Redge Gainer, MD  Cardiologist:   Minus Breeding, MD   Chief Complaint  Patient presents with  . Heart Murmur      History of Present Illness: Autumn Moyer is a 74 y.o. female who presents for evaluation of a heart murmur. She has no prior cardiac history. She does report a stress test some years ago. She somewhat active climbing stairs frequently to her basement laundry. She might get a little winded with this but this is mild. She can recover quickly. She does not have any resting shortness of breath. She doesn't have any PND or orthopnea. She denies any palpitations, presyncope or syncope. She has no chest pressure, neck or arm discomfort. She was recently noted on routine exam to have a heart murmur.   Past Medical History  Diagnosis Date  . Hypertension   . Thyroid disease     Past Surgical History  Procedure Laterality Date  . Eye surgery    . Tubal ligation    . Toe surgery       Current Outpatient Prescriptions  Medication Sig Dispense Refill  . benazepril-hydrochlorthiazide (LOTENSIN HCT) 20-12.5 MG per tablet TAKE ONE TABLET BY MOUTH ONCE DAILY 90 tablet 5  . Iron Combinations (IRON COMPLEX PO) Take by mouth.    . levothyroxine (SYNTHROID, LEVOTHROID) 100 MCG tablet TAKE ONE TABLET BY MOUTH ONCE DAILY IN THE MORNING ON  AN  EMPTY  STOMACH 90 tablet 1  . mometasone (ELOCON) 0.1 % cream Apply 1 application topically daily. 45 g 0  . Multiple Vitamins-Minerals (ICAPS MV PO) Take by mouth.    . mupirocin ointment (BACTROBAN) 2 %     . pseudoephedrine (DECONGESTANT 12HOUR MAX ST) 120 MG 12 hr tablet Take 120 mg by mouth 2 (two) times daily.    . ranitidine (ZANTAC) 150 MG tablet TAKE ONE TABLET BY MOUTH TWICE DAILY AS NEEDED 60 tablet 5  . Triprolidine-Pseudoephedrine (ANTIHISTAMINE PO) Take 10 mg by mouth daily.     No current facility-administered  medications for this visit.    Allergies:   Noroxin    Social History:  The patient  reports that she has never smoked. She has never used smokeless tobacco. She reports that she does not drink alcohol or use illicit drugs.   Family History:  The patient's family history includes Heart disease in her father; Heart failure in her mother; Hyperlipidemia in her father and mother; Hypertension in her father, mother, and sister; Lung cancer in her father.    ROS:  Please see the history of present illness.   Otherwise, review of systems are positive for hot natured.   All other systems are reviewed and negative.    PHYSICAL EXAM: VS:  BP 158/92 mmHg  Pulse 94  Ht 5\' 4"  (1.626 m)  Wt 180 lb (81.647 kg)  BMI 30.88 kg/m2  SpO2 93% , BMI Body mass index is 30.88 kg/(m^2). GENERAL:  Well appearing HEENT:  Pupils equal round and reactive, fundi not visualized, oral mucosa unremarkable NECK:  No jugular venous distention, waveform within normal limits, carotid upstroke brisk and symmetric, no bruits, no thyromegaly LYMPHATICS:  No cervical, inguinal adenopathy LUNGS:  Clear to auscultation bilaterally BACK:  No CVA tenderness CHEST:  Unremarkable HEART:  PMI not displaced or sustained,S1 and S2 within normal limits, no S3, no S4, no clicks, no rubs,  2 out of 6 apical systolic murmur radiating out the aortic outflow tract and early to mid peaking, no diastolic murmurs ABD:  Flat, positive bowel sounds normal in frequency in pitch, no bruits, no rebound, no guarding, no midline pulsatile mass, no hepatomegaly, no splenomegaly EXT:  2 plus pulses throughout, no edema, no cyanosis no clubbing SKIN:  No rashes no nodules NEURO:  Cranial nerves II through XII grossly intact, motor grossly intact throughout PSYCH:  Cognitively intact, oriented to person place and time    EKG:  EKG is ordered today. The ekg ordered today demonstrates sinus rhythm, rate 89, axis within normal limits, intervals  within normal limits, nonspecific T-wave flattening.   Recent Labs: 08/15/2014: ALT 15; BUN 17; Creatinine, Ser 0.91; Hemoglobin 13.9; Potassium 4.5; Sodium 139; TSH 0.544    Lipid Panel No results found for: CHOL, TRIG, HDL, CHOLHDL, VLDL, LDLCALC, LDLDIRECT    Wt Readings from Last 3 Encounters:  09/05/14 180 lb (81.647 kg)  08/15/14 179 lb (81.194 kg)  11/15/13 178 lb 6.4 oz (80.922 kg)      Other studies Reviewed: Additional studies/ records that were reviewed today include: Labs. Review of the above records demonstrates:  Please see elsewhere in the note.     ASSESSMENT AND PLAN:  MURMUR:   I suspect some mild aortic stenosis. There is no dynamic change to this murmur so I don't think his hypertrophic cardiomyopathy. I will evaluate with an echocardiogram. I suspect this will be followed clinically.    HTN:  The blood pressure is at target. No change in medications is indicated. We will continue with therapeutic lifestyle changes (TLC).   Current medicines are reviewed at length with the patient today.  The patient does not have concerns regarding medicines.  The following changes have been made:  no change  Labs/ tests ordered today include:  Orders Placed This Encounter  Procedures  . EKG 12-Lead echo     Disposition:   FU with me in two years.     Signed, Minus Breeding, MD  09/05/2014 12:00 PM    [place]

## 2014-09-05 NOTE — Patient Instructions (Signed)
Your physician has requested that you have an echocardiogram. Echocardiography is a painless test that uses sound waves to create images of your heart. It provides your doctor with information about the size and shape of your heart and how well your heart's chambers and valves are working. This procedure takes approximately one hour. There are no restrictions for this procedure. Office will contact with results via phone or letter.   Continue all current medications. Your physician wants you to follow up in:  2 years.  You will receive a reminder letter in the mail one-two months in advance.  If you don't receive a letter, please call our office to schedule the follow up appointment

## 2014-09-16 ENCOUNTER — Ambulatory Visit
Admission: RE | Admit: 2014-09-16 | Discharge: 2014-09-16 | Disposition: A | Discharge status: Home / Self Care | Payer: Medicare Other | Source: Ambulatory Visit

## 2014-09-16 DIAGNOSIS — Z1231 Encounter for screening mammogram for malignant neoplasm of breast: Secondary | ICD-10-CM | POA: Diagnosis not present

## 2014-09-16 DIAGNOSIS — D239 Other benign neoplasm of skin, unspecified: Secondary | ICD-10-CM | POA: Diagnosis not present

## 2014-09-16 DIAGNOSIS — L570 Actinic keratosis: Secondary | ICD-10-CM | POA: Diagnosis not present

## 2014-09-16 DIAGNOSIS — S60450A Superficial foreign body of right index finger, initial encounter: Secondary | ICD-10-CM | POA: Diagnosis not present

## 2014-09-18 ENCOUNTER — Ambulatory Visit (INDEPENDENT_AMBULATORY_CARE_PROVIDER_SITE_OTHER): Payer: Medicare Other

## 2014-09-18 ENCOUNTER — Other Ambulatory Visit: Payer: Self-pay

## 2014-09-18 DIAGNOSIS — R011 Cardiac murmur, unspecified: Secondary | ICD-10-CM

## 2014-11-06 ENCOUNTER — Ambulatory Visit (INDEPENDENT_AMBULATORY_CARE_PROVIDER_SITE_OTHER): Payer: Medicare Other | Admitting: Family

## 2014-11-06 ENCOUNTER — Encounter: Payer: Self-pay | Admitting: Family

## 2014-11-06 VITALS — BP 142/73 | HR 85 | Temp 97.8°F | Ht 64.0 in | Wt 180.0 lb

## 2014-11-06 DIAGNOSIS — Z1839 Other retained organic fragments: Principal | ICD-10-CM

## 2014-11-06 DIAGNOSIS — S40852A Superficial foreign body of left upper arm, initial encounter: Secondary | ICD-10-CM | POA: Diagnosis not present

## 2014-11-06 DIAGNOSIS — S50852A Superficial foreign body of left forearm, initial encounter: Secondary | ICD-10-CM | POA: Diagnosis not present

## 2014-11-06 DIAGNOSIS — I10 Essential (primary) hypertension: Secondary | ICD-10-CM

## 2014-11-06 MED ORDER — BENAZEPRIL-HYDROCHLOROTHIAZIDE 20-25 MG PO TABS: 1.0000 | ORAL_TABLET | Freq: Every day | ORAL | Status: DC

## 2014-11-06 NOTE — Progress Notes (Addendum)
   Subjective:    Patient ID: Autumn Moyer, female    DOB: 04-22-1940, 74 y.o.   MRN: [id number]  Pt presents to the office today for "sore" on her left wrist. Pt states she noticed it yesterday. Pt states she saw it and noticed the black center. Pt denies any pain or drainage from area.  Pt's BP is also elevated today.  Hypertension This is a chronic problem. The current episode started more than 1 year ago. The problem is unchanged. The problem is uncontrolled. Associated symptoms include peripheral edema ("my feet at the end of the day"). Pertinent negatives include no anxiety, headaches, palpitations or shortness of breath. Risk factors for coronary artery disease include post-menopausal state, sedentary lifestyle, obesity and family history. Past treatments include ACE inhibitors and diuretics. There is no history of kidney disease, CAD/MI, CVA, heart failure or a thyroid problem.      Review of Systems  Constitutional: Negative.   HENT: Negative.   Eyes: Negative.   Respiratory: Negative.  Negative for shortness of breath.   Cardiovascular: Negative.  Negative for palpitations.  Gastrointestinal: Negative.   Endocrine: Negative.   Genitourinary: Negative.   Musculoskeletal: Negative.   Neurological: Negative.  Negative for headaches.  Hematological: Negative.   Psychiatric/Behavioral: Negative.   All other systems reviewed and are negative.      Objective:   Physical Exam  Constitutional: She is oriented to person, place, and time. She appears well-developed and well-nourished. No distress.  HENT:  Head: Normocephalic and atraumatic.  Right Ear: External ear normal.  Left Ear: External ear normal.  Nose: Nose normal.  Mouth/Throat: Oropharynx is clear and moist.  Eyes: Pupils are equal, round, and reactive to light.  Neck: Normal range of motion. Neck supple. No thyromegaly present.  Cardiovascular: Normal rate, regular rhythm, normal heart sounds and intact distal  pulses.   No murmur heard. Pulmonary/Chest: Effort normal and breath sounds normal. No respiratory distress. She has no wheezes.  Abdominal: Soft. Bowel sounds are normal. She exhibits no distension. There is no tenderness.  Musculoskeletal: Normal range of motion. She exhibits no edema or tenderness.  Neurological: She is alert and oriented to person, place, and time. She has normal reflexes. No cranial nerve deficit.  Skin: Skin is warm and dry.  Small head of tick  embedded in left forearm   Psychiatric: She has a normal mood and affect. Her behavior is normal. Judgment and thought content normal.  Vitals reviewed.  Embedded tick removed from left forearm    BP 142/73 mmHg  Pulse 85  Temp(Src) 97.8 F (36.6 C) (Oral)  Ht _0  (1.626 m)  Wt 180 lb (81.647 kg)  BMI 30.88 kg/m2     Assessment & Plan:  1. Embedded tick of left upper arm, initial encounter --Pt to report any new fever, joint pain, or rash -Apply antibiotic ointment to area and keep clean and dry -Wear protective clothing while outside- Long sleeves and long pants -Put insect repellent on all exposed skin and along clothing -Take a shower as soon as possible after being outside - BMP8+EGFR  2. Essential hypertension Pt's HCTZ increased to 25 mg from 12.5 mg -Dash diet information given -Exercise encouraged - Stress Management  -Continue current meds -RTO in 2 weeks  - benazepril-hydrochlorthiazide (LOTENSIN HCT) 20-25 MG per tablet; Take 1 tablet by mouth daily.  Dispense: 90 tablet; Refill: 3 - BMP8+EGFR  Evelina Dun, FNP

## 2014-11-06 NOTE — Patient Instructions (Signed)
DASH Eating Plan DASH stands for "Dietary Approaches to Stop Hypertension." The DASH eating plan is a healthy eating plan that has been shown to reduce high blood pressure (hypertension). Additional health benefits may include reducing the risk of type 2 diabetes mellitus, heart disease, and stroke. The DASH eating plan may also help with weight loss. WHAT DO I NEED TO KNOW ABOUT THE DASH EATING PLAN? For the DASH eating plan, you will follow these general guidelines:  Choose foods with a percent daily value for sodium of less than 5% (as listed on the food label).  Use salt-free seasonings or herbs instead of table salt or sea salt.  Check with your health care provider or pharmacist before using salt substitutes.  Eat lower-sodium products, often labeled as "lower sodium" or "no salt added."  Eat fresh foods.  Eat more vegetables, fruits, and low-fat dairy products.  Choose whole grains. Look for the word "whole" as the first word in the ingredient list.  Choose fish and skinless chicken or turkey more often than red meat. Limit fish, poultry, and meat to 6 oz (170 g) each day.  Limit sweets, desserts, sugars, and sugary drinks.  Choose heart-healthy fats.  Limit cheese to 1 oz (28 g) per day.  Eat more home-cooked food and less restaurant, buffet, and fast food.  Limit fried foods.  Cook foods using methods other than frying.  Limit canned vegetables. If you do use them, rinse them well to decrease the sodium.  When eating at a restaurant, ask that your food be prepared with less salt, or no salt if possible. WHAT FOODS CAN I EAT? Seek help from a dietitian for individual calorie needs. Grains Whole grain or whole wheat bread. Brown rice. Whole grain or whole wheat pasta. Quinoa, bulgur, and whole grain cereals. Low-sodium cereals. Corn or whole wheat flour tortillas. Whole grain cornbread. Whole grain crackers. Low-sodium crackers. Vegetables Fresh or frozen vegetables  (raw, steamed, roasted, or grilled). Low-sodium or reduced-sodium tomato and vegetable juices. Low-sodium or reduced-sodium tomato sauce and paste. Low-sodium or reduced-sodium canned vegetables.  Fruits All fresh, canned (in natural juice), or frozen fruits. Meat and Other Protein Products Ground beef (85% or leaner), grass-fed beef, or beef trimmed of fat. Skinless chicken or turkey. Ground chicken or turkey. Pork trimmed of fat. All fish and seafood. Eggs. Dried beans, peas, or lentils. Unsalted nuts and seeds. Unsalted canned beans. Dairy Low-fat dairy products, such as skim or 1% milk, 2% or reduced-fat cheeses, low-fat ricotta or cottage cheese, or plain low-fat yogurt. Low-sodium or reduced-sodium cheeses. Fats and Oils Tub margarines without trans fats. Light or reduced-fat mayonnaise and salad dressings (reduced sodium). Avocado. Safflower, olive, or canola oils. Natural peanut or almond butter. Other Unsalted popcorn and pretzels. The items listed above may not be a complete list of recommended foods or beverages. Contact your dietitian for more options. WHAT FOODS ARE NOT RECOMMENDED? Grains White bread. White pasta. White rice. Refined cornbread. Bagels and croissants. Crackers that contain trans fat. Vegetables Creamed or fried vegetables. Vegetables in a cheese sauce. Regular canned vegetables. Regular canned tomato sauce and paste. Regular tomato and vegetable juices. Fruits Dried fruits. Canned fruit in light or heavy syrup. Fruit juice. Meat and Other Protein Products Fatty cuts of meat. Ribs, chicken wings, bacon, sausage, bologna, salami, chitterlings, fatback, hot dogs, bratwurst, and packaged luncheon meats. Salted nuts and seeds. Canned beans with salt. Dairy Whole or 2% milk, cream, half-and-half, and cream cheese. Whole-fat or sweetened yogurt. Full-fat   cheeses or blue cheese. Nondairy creamers and whipped toppings. Processed cheese, cheese spreads, or cheese  curds. Condiments Onion and garlic salt, seasoned salt, table salt, and sea salt. Canned and packaged gravies. Worcestershire sauce. Tartar sauce. Barbecue sauce. Teriyaki sauce. Soy sauce, including reduced sodium. Steak sauce. Fish sauce. Oyster sauce. Cocktail sauce. Horseradish. Ketchup and mustard. Meat flavorings and tenderizers. Bouillon cubes. Hot sauce. Tabasco sauce. Marinades. Taco seasonings. Relishes. Fats and Oils Butter, stick margarine, lard, shortening, ghee, and bacon fat. Coconut, palm kernel, or palm oils. Regular salad dressings. Other Pickles and olives. Salted popcorn and pretzels. The items listed above may not be a complete list of foods and beverages to avoid. Contact your dietitian for more information. WHERE CAN I FIND MORE INFORMATION? National Heart, Lung, and [employer] Institute: www.nhlbi.nih.gov/health/health-topics/topics/dash/ Document Released: 02/24/2011 Document Revised: 07/22/2013 Document Reviewed: 01/09/2013 ExitCare Patient Information 2015 [employer]. This information is not intended to replace advice given to you by your health care provider. Make sure you discuss any questions you have with your health care provider. Hypertension Hypertension, commonly called high blood pressure, is when the force of blood pumping through your arteries is too strong. Your arteries are the blood vessels that carry blood from your heart throughout your body. A blood pressure reading consists of a higher number over a lower number, such as 110/72. The higher number (systolic) is the pressure inside your arteries when your heart pumps. The lower number (diastolic) is the pressure inside your arteries when your heart relaxes. Ideally you want your blood pressure below 120/80. Hypertension forces your heart to work harder to pump blood. Your arteries may become narrow or stiff. Having hypertension puts you at risk for heart disease, stroke, and other problems.  RISK  FACTORS Some risk factors for high blood pressure are controllable. Others are not.  Risk factors you cannot control include:   Race. You may be at higher risk if you are African American.  Age. Risk increases with age.  Gender. Men are at higher risk than women before age 45 years. After age 65, women are at higher risk than men. Risk factors you can control include:  Not getting enough exercise or physical activity.  Being overweight.  Getting too much fat, sugar, calories, or salt in your diet.  Drinking too much alcohol. SIGNS AND SYMPTOMS Hypertension does not usually cause signs or symptoms. Extremely high blood pressure (hypertensive crisis) may cause headache, anxiety, shortness of breath, and nosebleed. DIAGNOSIS  To check if you have hypertension, your health care provider will measure your blood pressure while you are seated, with your arm held at the level of your heart. It should be measured at least twice using the same arm. Certain conditions can cause a difference in blood pressure between your right and left arms. A blood pressure reading that is higher than normal on one occasion does not mean that you need treatment. If one blood pressure reading is high, ask your health care provider about having it checked again. TREATMENT  Treating high blood pressure includes making lifestyle changes and possibly taking medicine. Living a healthy lifestyle can help lower high blood pressure. You may need to change some of your habits. Lifestyle changes may include:  Following the DASH diet. This diet is high in fruits, vegetables, and whole grains. It is low in salt, red meat, and added sugars.  Getting at least 2 hours of brisk physical activity every week.  Losing weight if necessary.  Not smoking.  Limiting   alcoholic beverages.  Learning ways to reduce stress. If lifestyle changes are not enough to get your blood pressure under control, your health care provider may  prescribe medicine. You may need to take more than one. Work closely with your health care provider to understand the risks and benefits. HOME CARE INSTRUCTIONS  Have your blood pressure rechecked as directed by your health care provider.   Take medicines only as directed by your health care provider. Follow the directions carefully. Blood pressure medicines must be taken as prescribed. The medicine does not work as well when you skip doses. Skipping doses also puts you at risk for problems.   Do not smoke.   Monitor your blood pressure at home as directed by your health care provider. SEEK MEDICAL CARE IF:   You think you are having a reaction to medicines taken.  You have recurrent headaches or feel dizzy.  You have swelling in your ankles.  You have trouble with your vision. SEEK IMMEDIATE MEDICAL CARE IF:  You develop a severe headache or confusion.  You have unusual weakness, numbness, or feel faint.  You have severe chest or abdominal pain.  You vomit repeatedly.  You have trouble breathing. MAKE SURE YOU:   Understand these instructions.  Will watch your condition.  Will get help right away if you are not doing well or get worse. Document Released: 03/07/2005 Document Revised: 07/22/2013 Document Reviewed: 12/28/2012 ExitCare Patient Information 2015 [employer]. This information is not intended to replace advice given to you by your health care provider. Make sure you discuss any questions you have with your health care provider.  

## 2014-11-07 LAB — BMP8+EGFR
BUN/Creatinine Ratio: 16 (ref 11–26)
BUN: 13 mg/dL (ref 8–27)
CALCIUM: 9.4 mg/dL (ref 8.7–10.3)
CHLORIDE: 100 mmol/L (ref 97–108)
CO2: 21 mmol/L (ref 18–29)
Creatinine, Ser: 0.83 mg/dL (ref 0.57–1.00)
GFR calc Af Amer: 82 mL/min/{1.73_m2} (ref >59)
GFR calc non Af Amer: 71 mL/min/{1.73_m2} (ref >59)
GLUCOSE: 90 mg/dL (ref 65–99)
POTASSIUM: 4 mmol/L (ref 3.5–5.2)
Sodium: 141 mmol/L (ref 134–144)

## 2014-11-21 ENCOUNTER — Ambulatory Visit (INDEPENDENT_AMBULATORY_CARE_PROVIDER_SITE_OTHER): Payer: Medicare Other | Admitting: Family

## 2014-11-21 ENCOUNTER — Encounter: Payer: Self-pay | Admitting: Family

## 2014-11-21 ENCOUNTER — Ambulatory Visit: Payer: Medicare Other | Admitting: Physician Assistant

## 2014-11-21 VITALS — BP 120/69 | HR 99 | Temp 97.6°F | Ht 64.0 in | Wt 178.2 lb

## 2014-11-21 DIAGNOSIS — I10 Essential (primary) hypertension: Secondary | ICD-10-CM

## 2014-11-21 NOTE — Progress Notes (Signed)
   Subjective:    Patient ID: Autumn Moyer, female    DOB: February 05, 1941, 74 y.o.   MRN: [id number]  Pt presents to the office today to recheck HTN. Pt's BP is at goal today. PT reports feeling "more tired" since increasing the HCTZ. Pt told to take BP med at night to see if she can tell a difference in her fatigue.  Hypertension This is a chronic problem. The current episode started more than 1 year ago. The problem is unchanged. The problem is uncontrolled. Associated symptoms include peripheral edema ("my feet at the end of the day"). Pertinent negatives include no anxiety, headaches, palpitations or shortness of breath. Risk factors for coronary artery disease include post-menopausal state, sedentary lifestyle, obesity and family history. Past treatments include ACE inhibitors and diuretics. There is no history of kidney disease, CAD/MI, CVA, heart failure or a thyroid problem.      Review of Systems  Constitutional: Negative.   HENT: Negative.   Eyes: Negative.   Respiratory: Negative.  Negative for shortness of breath.   Cardiovascular: Negative.  Negative for palpitations.  Gastrointestinal: Negative.   Endocrine: Negative.   Genitourinary: Negative.   Musculoskeletal: Negative.   Neurological: Negative.  Negative for headaches.  Hematological: Negative.   Psychiatric/Behavioral: Negative.   All other systems reviewed and are negative.      Objective:   Physical Exam  Constitutional: She is oriented to person, place, and time. She appears well-developed and well-nourished. No distress.  HENT:  Head: Normocephalic and atraumatic.  Right Ear: External ear normal.  Left Ear: External ear normal.  Nose: Nose normal.  Mouth/Throat: Oropharynx is clear and moist.  Eyes: Pupils are equal, round, and reactive to light.  Neck: Normal range of motion. Neck supple. No thyromegaly present.  Cardiovascular: Normal rate, regular rhythm, normal heart sounds and intact distal pulses.   No  murmur heard. Pulmonary/Chest: Effort normal and breath sounds normal. No respiratory distress. She has no wheezes.  Abdominal: Soft. Bowel sounds are normal. She exhibits no distension. There is no tenderness.  Musculoskeletal: Normal range of motion. She exhibits no edema or tenderness.  Neurological: She is alert and oriented to person, place, and time. She has normal reflexes. No cranial nerve deficit.  Skin: Skin is warm and dry.  Psychiatric: She has a normal mood and affect. Her behavior is normal. Judgment and thought content normal.  Vitals reviewed.   BP 120/69 mmHg  Pulse 99  Temp(Src) 97.6 F (36.4 C) (Oral)  Ht _0  (1.626 m)  Wt 178 lb 3.2 oz (80.831 kg)  BMI 30.57 kg/m2       Assessment & Plan:  1. Essential hypertension, benign -Dash diet information given -Exercise encouraged - Stress Management  -Continue current meds -RTO in 6 months - BMP8+EGFR   Continue all meds Labs pending Health Maintenance reviewed Diet and exercise encouraged RTO 6 months  Evelina Dun, FNP

## 2014-11-21 NOTE — Patient Instructions (Signed)
DASH Eating Plan DASH stands for "Dietary Approaches to Stop Hypertension." The DASH eating plan is a healthy eating plan that has been shown to reduce high blood pressure (hypertension). Additional health benefits may include reducing the risk of type 2 diabetes mellitus, heart disease, and stroke. The DASH eating plan may also help with weight loss. WHAT DO I NEED TO KNOW ABOUT THE DASH EATING PLAN? For the DASH eating plan, you will follow these general guidelines:  Choose foods with a percent daily value for sodium of less than 5% (as listed on the food label).  Use salt-free seasonings or herbs instead of table salt or sea salt.  Check with your health care provider or pharmacist before using salt substitutes.  Eat lower-sodium products, often labeled as "lower sodium" or "no salt added."  Eat fresh foods.  Eat more vegetables, fruits, and low-fat dairy products.  Choose whole grains. Look for the word "whole" as the first word in the ingredient list.  Choose fish and skinless chicken or turkey more often than red meat. Limit fish, poultry, and meat to 6 oz (170 g) each day.  Limit sweets, desserts, sugars, and sugary drinks.  Choose heart-healthy fats.  Limit cheese to 1 oz (28 g) per day.  Eat more home-cooked food and less restaurant, buffet, and fast food.  Limit fried foods.  Cook foods using methods other than frying.  Limit canned vegetables. If you do use them, rinse them well to decrease the sodium.  When eating at a restaurant, ask that your food be prepared with less salt, or no salt if possible. WHAT FOODS CAN I EAT? Seek help from a dietitian for individual calorie needs. Grains Whole grain or whole wheat bread. Brown rice. Whole grain or whole wheat pasta. Quinoa, bulgur, and whole grain cereals. Low-sodium cereals. Corn or whole wheat flour tortillas. Whole grain cornbread. Whole grain crackers. Low-sodium crackers. Vegetables Fresh or frozen vegetables  (raw, steamed, roasted, or grilled). Low-sodium or reduced-sodium tomato and vegetable juices. Low-sodium or reduced-sodium tomato sauce and paste. Low-sodium or reduced-sodium canned vegetables.  Fruits All fresh, canned (in natural juice), or frozen fruits. Meat and Other Protein Products Ground beef (85% or leaner), grass-fed beef, or beef trimmed of fat. Skinless chicken or turkey. Ground chicken or turkey. Pork trimmed of fat. All fish and seafood. Eggs. Dried beans, peas, or lentils. Unsalted nuts and seeds. Unsalted canned beans. Dairy Low-fat dairy products, such as skim or 1% milk, 2% or reduced-fat cheeses, low-fat ricotta or cottage cheese, or plain low-fat yogurt. Low-sodium or reduced-sodium cheeses. Fats and Oils Tub margarines without trans fats. Light or reduced-fat mayonnaise and salad dressings (reduced sodium). Avocado. Safflower, olive, or canola oils. Natural peanut or almond butter. Other Unsalted popcorn and pretzels. The items listed above may not be a complete list of recommended foods or beverages. Contact your dietitian for more options. WHAT FOODS ARE NOT RECOMMENDED? Grains White bread. White pasta. White rice. Refined cornbread. Bagels and croissants. Crackers that contain trans fat. Vegetables Creamed or fried vegetables. Vegetables in a cheese sauce. Regular canned vegetables. Regular canned tomato sauce and paste. Regular tomato and vegetable juices. Fruits Dried fruits. Canned fruit in light or heavy syrup. Fruit juice. Meat and Other Protein Products Fatty cuts of meat. Ribs, chicken wings, bacon, sausage, bologna, salami, chitterlings, fatback, hot dogs, bratwurst, and packaged luncheon meats. Salted nuts and seeds. Canned beans with salt. Dairy Whole or 2% milk, cream, half-and-half, and cream cheese. Whole-fat or sweetened yogurt. Full-fat   cheeses or blue cheese. Nondairy creamers and whipped toppings. Processed cheese, cheese spreads, or cheese  curds. Condiments Onion and garlic salt, seasoned salt, table salt, and sea salt. Canned and packaged gravies. Worcestershire sauce. Tartar sauce. Barbecue sauce. Teriyaki sauce. Soy sauce, including reduced sodium. Steak sauce. Fish sauce. Oyster sauce. Cocktail sauce. Horseradish. Ketchup and mustard. Meat flavorings and tenderizers. Bouillon cubes. Hot sauce. Tabasco sauce. Marinades. Taco seasonings. Relishes. Fats and Oils Butter, stick margarine, lard, shortening, ghee, and bacon fat. Coconut, palm kernel, or palm oils. Regular salad dressings. Other Pickles and olives. Salted popcorn and pretzels. The items listed above may not be a complete list of foods and beverages to avoid. Contact your dietitian for more information. WHERE CAN I FIND MORE INFORMATION? National Heart, Lung, and [employer] Institute: www.nhlbi.nih.gov/health/health-topics/topics/dash/ Document Released: 02/24/2011 Document Revised: 07/22/2013 Document Reviewed: 01/09/2013 ExitCare Patient Information 2015 [employer]. This information is not intended to replace advice given to you by your health care provider. Make sure you discuss any questions you have with your health care provider. Hypertension Hypertension, commonly called high blood pressure, is when the force of blood pumping through your arteries is too strong. Your arteries are the blood vessels that carry blood from your heart throughout your body. A blood pressure reading consists of a higher number over a lower number, such as 110/72. The higher number (systolic) is the pressure inside your arteries when your heart pumps. The lower number (diastolic) is the pressure inside your arteries when your heart relaxes. Ideally you want your blood pressure below 120/80. Hypertension forces your heart to work harder to pump blood. Your arteries may become narrow or stiff. Having hypertension puts you at risk for heart disease, stroke, and other problems.  RISK  FACTORS Some risk factors for high blood pressure are controllable. Others are not.  Risk factors you cannot control include:   Race. You may be at higher risk if you are African American.  Age. Risk increases with age.  Gender. Men are at higher risk than women before age 45 years. After age 65, women are at higher risk than men. Risk factors you can control include:  Not getting enough exercise or physical activity.  Being overweight.  Getting too much fat, sugar, calories, or salt in your diet.  Drinking too much alcohol. SIGNS AND SYMPTOMS Hypertension does not usually cause signs or symptoms. Extremely high blood pressure (hypertensive crisis) may cause headache, anxiety, shortness of breath, and nosebleed. DIAGNOSIS  To check if you have hypertension, your health care provider will measure your blood pressure while you are seated, with your arm held at the level of your heart. It should be measured at least twice using the same arm. Certain conditions can cause a difference in blood pressure between your right and left arms. A blood pressure reading that is higher than normal on one occasion does not mean that you need treatment. If one blood pressure reading is high, ask your health care provider about having it checked again. TREATMENT  Treating high blood pressure includes making lifestyle changes and possibly taking medicine. Living a healthy lifestyle can help lower high blood pressure. You may need to change some of your habits. Lifestyle changes may include:  Following the DASH diet. This diet is high in fruits, vegetables, and whole grains. It is low in salt, red meat, and added sugars.  Getting at least 2 hours of brisk physical activity every week.  Losing weight if necessary.  Not smoking.  Limiting   alcoholic beverages.  Learning ways to reduce stress. If lifestyle changes are not enough to get your blood pressure under control, your health care provider may  prescribe medicine. You may need to take more than one. Work closely with your health care provider to understand the risks and benefits. HOME CARE INSTRUCTIONS  Have your blood pressure rechecked as directed by your health care provider.   Take medicines only as directed by your health care provider. Follow the directions carefully. Blood pressure medicines must be taken as prescribed. The medicine does not work as well when you skip doses. Skipping doses also puts you at risk for problems.   Do not smoke.   Monitor your blood pressure at home as directed by your health care provider. SEEK MEDICAL CARE IF:   You think you are having a reaction to medicines taken.  You have recurrent headaches or feel dizzy.  You have swelling in your ankles.  You have trouble with your vision. SEEK IMMEDIATE MEDICAL CARE IF:  You develop a severe headache or confusion.  You have unusual weakness, numbness, or feel faint.  You have severe chest or abdominal pain.  You vomit repeatedly.  You have trouble breathing. MAKE SURE YOU:   Understand these instructions.  Will watch your condition.  Will get help right away if you are not doing well or get worse. Document Released: 03/07/2005 Document Revised: 07/22/2013 Document Reviewed: 12/28/2012 ExitCare Patient Information 2015 [employer]. This information is not intended to replace advice given to you by your health care provider. Make sure you discuss any questions you have with your health care provider.  

## 2014-11-21 NOTE — Addendum Note (Signed)
Addended by: Earlene Plater on: 11/21/2014 11:19 AM   Modules accepted: Miquel Dunn

## 2014-11-22 LAB — BMP8+EGFR
BUN/Creatinine Ratio: 19 (ref 11–26)
BUN: 16 mg/dL (ref 8–27)
CALCIUM: 9.5 mg/dL (ref 8.7–10.3)
CHLORIDE: 99 mmol/L (ref 97–108)
CO2: 23 mmol/L (ref 18–29)
Creatinine, Ser: 0.84 mg/dL (ref 0.57–1.00)
GFR calc Af Amer: 81 mL/min/{1.73_m2} (ref >59)
GFR calc non Af Amer: 70 mL/min/{1.73_m2} (ref >59)
Glucose: 106 mg/dL — ABNORMAL HIGH (ref 65–99)
Potassium: 3.8 mmol/L (ref 3.5–5.2)
SODIUM: 140 mmol/L (ref 134–144)

## 2014-12-13 ENCOUNTER — Ambulatory Visit (INDEPENDENT_AMBULATORY_CARE_PROVIDER_SITE_OTHER): Payer: Medicare Other | Admitting: Family Medicine

## 2014-12-13 VITALS — BP 122/80 | HR 91 | Temp 97.1°F | Ht 64.0 in | Wt 176.0 lb

## 2014-12-13 DIAGNOSIS — H6122 Impacted cerumen, left ear: Secondary | ICD-10-CM

## 2014-12-13 MED ORDER — DOCUSATE SODIUM 50 MG/5ML PO LIQD: ORAL | Status: DC

## 2014-12-13 NOTE — Patient Instructions (Signed)
Cerumen Impaction °A cerumen impaction is when the wax in your ear forms a plug. This plug usually causes reduced hearing. Sometimes it also causes an earache or dizziness. Removing a cerumen impaction can be difficult and painful. The wax sticks to the ear canal. The canal is sensitive and bleeds easily. If you try to remove a heavy wax buildup with a cotton tipped swab, you may push it in further. °Irrigation with water, suction, and small ear curettes may be used to clear out the wax. If the impaction is fixed to the skin in the ear canal, ear drops may be needed for a few days to loosen the wax. People who build up a lot of wax frequently can use ear wax removal products available in your local drugstore. °SEEK MEDICAL CARE IF:  °You develop an earache, increased hearing loss, or marked dizziness. °Document Released: 04/14/2004 Document Revised: 05/30/2011 Document Reviewed: 06/04/2009 °ExitCare® Patient Information ©2015 [employer], [employer]. This information is not intended to replace advice given to you by your health care provider. Make sure you discuss any questions you have with your health care provider. ° °

## 2014-12-13 NOTE — Progress Notes (Signed)
BP 122/80 mmHg  Pulse 91  Temp(Src) 97.1 F (36.2 C) (Oral)  Ht 5' 4"  (1.626 m)  Wt 176 lb (79.833 kg)  BMI 30.20 kg/m2   Subjective:    Patient ID: Autumn Moyer, female    DOB: 1940-04-06, 74 y.o.   MRN: [id number]  HPI: Autumn Moyer is a 74 y.o. female presenting on 12/13/2014 for Hearing Loss   HPI Trouble hearing Patient is feeling of muffled left ear in trouble hearing out of that ear. She denies pain or fevers or discharge besides the wax. She denies any sinus, nasal congestion or drainage. Besides her ear that is really nothing else going on. This is been going on for 2-3 days. She has been using Q-tips to try and cleared out but that is not helping.  Relevant past medical, surgical, family and social history reviewed and updated as indicated. Interim medical history since our last visit reviewed. Allergies and medications reviewed and updated.  Review of Systems  Constitutional: Negative for fever and chills.  HENT: Positive for ear discharge (excess wax and muffled hearing). Negative for congestion, ear pain, postnasal drip, rhinorrhea, sinus pressure, sneezing and sore throat.   Eyes: Negative for pain, redness and visual disturbance.  Respiratory: Negative for chest tightness and shortness of breath.   Cardiovascular: Negative for chest pain and leg swelling.  Genitourinary: Negative for dysuria and difficulty urinating.  Musculoskeletal: Negative for back pain and gait problem.  Skin: Negative for rash.  Neurological: Negative for dizziness, light-headedness and headaches.  Psychiatric/Behavioral: Negative for behavioral problems and agitation.  All other systems reviewed and are negative.   Per HPI unless specifically indicated above     Medication List       This list is accurate as of: 12/13/14  9:07 AM.  Always use your most recent med list.               ANTIHISTAMINE PO  Take 10 mg by mouth daily.     benazepril-hydrochlorthiazide 20-25 MG per  tablet  Commonly known as:  LOTENSIN HCT  Take 1 tablet by mouth daily.     DECONGESTANT 12HOUR MAX ST 120 MG 12 hr tablet  Generic drug:  pseudoephedrine  Take 120 mg by mouth 2 (two) times daily.     docusate 50 MG/5ML liquid  Commonly known as:  COLACE  1 drop in each ear two times daily     GOODY HEADACHE PO  Take by mouth.     ICAPS MV PO  Take by mouth.     IRON COMPLEX PO  Take by mouth.     levothyroxine 100 MCG tablet  Commonly known as:  SYNTHROID, LEVOTHROID  TAKE ONE TABLET BY MOUTH ONCE DAILY IN THE MORNING ON  AN  EMPTY  STOMACH     mometasone 0.1 % cream  Commonly known as:  ELOCON  Apply 1 application topically daily.     mupirocin ointment 2 %  Commonly known as:  BACTROBAN     ranitidine 150 MG tablet  Commonly known as:  ZANTAC  TAKE ONE TABLET BY MOUTH TWICE DAILY AS NEEDED           Objective:    BP 122/80 mmHg  Pulse 91  Temp(Src) 97.1 F (36.2 C) (Oral)  Ht 5' 4"  (1.626 m)  Wt 176 lb (79.833 kg)  BMI 30.20 kg/m2  Wt Readings from Last 3 Encounters:  12/13/14 176 lb (79.833 kg)  11/21/14 178 lb 3.2  oz (80.831 kg)  11/06/14 180 lb (81.647 kg)    Physical Exam  Constitutional: She is oriented to person, place, and time. She appears well-developed and well-nourished. No distress.  Eyes: Conjunctivae and EOM are normal.  Cardiovascular: Normal rate and regular rhythm.   No murmur heard. Pulmonary/Chest: Effort normal and breath sounds normal. No respiratory distress. She has no wheezes.  Musculoskeletal: Normal range of motion. She exhibits no edema or tenderness.  Neurological: She is alert and oriented to person, place, and time. Coordination normal.  Skin: Skin is warm and dry. No rash noted. She is not diaphoretic.  Psychiatric: She has a normal mood and affect. Her behavior is normal.  Vitals reviewed.   Results for orders placed or performed in visit on 11/21/14  [facility]  Result Value Ref Range   Glucose 106 (H) 65 - 99  mg/dL   BUN 16 8 - 27 mg/dL   Creatinine, Ser 0.84 0.57 - 1.00 mg/dL   GFR calc non Af Amer 70 >59 mL/min/1.73   GFR calc Af Amer 81 >59 mL/min/1.73   BUN/Creatinine Ratio 19 11 - 26   Sodium 140 134 - 144 mmol/L   Potassium 3.8 3.5 - 5.2 mmol/L   Chloride 99 97 - 108 mmol/L   CO2 23 18 - 29 mmol/L   Calcium 9.5 8.7 - 10.3 mg/dL   Ear lavage: Wash of wax performed by nurse. Able to remove cerumen and have clear view of tympanic membranes afterwards. Patient felt like she could hear a lot better. No side effects noted.    Assessment & Plan:   Problem List Items Addressed This Visit    None    Visit Diagnoses    Cerumen impaction, left    -  Primary    Disimpacted cerumen using lavage.    Relevant Medications    docusate (COLACE) 50 MG/5ML liquid        Follow up plan: Return if symptoms worsen or fail to improve.  Caryl Pina, MD [facility] 12/13/2014, 9:07 AM

## 2014-12-20 ENCOUNTER — Ambulatory Visit (INDEPENDENT_AMBULATORY_CARE_PROVIDER_SITE_OTHER): Payer: Medicare Other | Admitting: Physician Assistant

## 2014-12-20 ENCOUNTER — Encounter: Payer: Self-pay | Admitting: Physician Assistant

## 2014-12-20 VITALS — BP 112/60 | HR 81 | Temp 99.5°F | Ht 64.0 in | Wt 175.4 lb

## 2014-12-20 DIAGNOSIS — H60392 Other infective otitis externa, left ear: Secondary | ICD-10-CM | POA: Diagnosis not present

## 2014-12-20 DIAGNOSIS — H6121 Impacted cerumen, right ear: Secondary | ICD-10-CM | POA: Diagnosis not present

## 2014-12-20 MED ORDER — CIPROFLOXACIN-DEXAMETHASONE 0.3-0.1 % OT SUSP: 4.0000 [drp] | Freq: Two times a day (BID) | OTIC | Status: DC

## 2014-12-20 NOTE — Patient Instructions (Signed)
Over the counter Debrox as directed

## 2014-12-20 NOTE — Progress Notes (Signed)
Patient ID: Autumn Moyer, female   DOB: 06-08-1940, 74 y.o.   MRN: [id number]  74 y/o female presents with c/o left ear pain and fullness. She was seen last week with cerumen impaction. Ear lavage was performed, which was successful, However, patient has began to feel ear pain and fullness again.   PE reveals erythematous and edematous ear canal. Small amount of cerumen visualized in bilateral ears. Attempts were made to remove cerumen out of right ear with currette which was mildly successful. I have advised patient to use otc Debrox as directed for resolution. I will treat otitis externa of left ear and advised patient to use debrox when pain resolves.   1. Otitis, externa, infective, left  - ciprofloxacin-dexamethasone (CIPRODEX) otic suspension; Place 4 drops into the left ear 2 (two) times daily.  Dispense: 7.5 mL; Refill: 0  2. Cerumen impaction, right  RTO prn   Raiden Haydu A. Benjamin Stain PA-C

## 2015-01-09 ENCOUNTER — Ambulatory Visit (INDEPENDENT_AMBULATORY_CARE_PROVIDER_SITE_OTHER): Payer: Medicare Other

## 2015-01-09 DIAGNOSIS — Z23 Encounter for immunization: Secondary | ICD-10-CM

## 2015-02-20 ENCOUNTER — Other Ambulatory Visit: Payer: Self-pay | Admitting: Physician Assistant

## 2015-03-18 DIAGNOSIS — L570 Actinic keratosis: Secondary | ICD-10-CM | POA: Diagnosis not present

## 2015-03-18 DIAGNOSIS — D485 Neoplasm of uncertain behavior of skin: Secondary | ICD-10-CM | POA: Diagnosis not present

## 2015-03-18 DIAGNOSIS — L814 Other melanin hyperpigmentation: Secondary | ICD-10-CM | POA: Diagnosis not present

## 2015-04-20 ENCOUNTER — Encounter: Payer: Self-pay | Admitting: Pediatrics

## 2015-04-20 ENCOUNTER — Ambulatory Visit (INDEPENDENT_AMBULATORY_CARE_PROVIDER_SITE_OTHER): Payer: PPO | Admitting: Pediatrics

## 2015-04-20 VITALS — BP 137/74 | HR 88 | Temp 97.1°F | Ht 64.0 in | Wt 178.8 lb

## 2015-04-20 DIAGNOSIS — H109 Unspecified conjunctivitis: Secondary | ICD-10-CM | POA: Diagnosis not present

## 2015-04-20 DIAGNOSIS — J309 Allergic rhinitis, unspecified: Secondary | ICD-10-CM

## 2015-04-20 MED ORDER — POLYMYXIN B-TRIMETHOPRIM 10000-0.1 UNIT/ML-% OP SOLN: 1.0000 [drp] | OPHTHALMIC | Status: DC

## 2015-04-20 MED ORDER — LEVOCETIRIZINE DIHYDROCHLORIDE 5 MG PO TABS: 5.0000 mg | ORAL_TABLET | Freq: Every evening | ORAL | Status: DC

## 2015-04-20 NOTE — Progress Notes (Signed)
Subjective:    Patient ID: Autumn Moyer, female    DOB: [birth year], 75 y.o.   MRN: [id number]  CC: Eye Drainage; right eye red; and teeth started hurting Friday   HPI: Autumn Moyer is a 75 y.o. female presenting for Eye Drainage; right eye red; and teeth started hurting Friday  Usually has to clear crusty matter out of eyes throughout the day Last three days even more green goop on R eye, continuing then started on L eye Burns when uses dry eye drops Recently seen by dentist   Depression screen [facility] 2/9 11/21/2014 11/06/2014 08/15/2014  Decreased Interest 0 0 0  Down, Depressed, Hopeless 0 0 0  PHQ - 2 Score 0 0 0     Relevant past medical, surgical, family and social history reviewed and updated as indicated. Interim medical history since our last visit reviewed. Allergies and medications reviewed and updated.    ROS: Per HPI unless specifically indicated above  History  Smoking status  . Never Smoker   Smokeless tobacco  . Never Used    Past Medical History Patient Active Problem List   Diagnosis Date Noted  . Murmur 09/05/2014  . Essential hypertension, benign 11/15/2013  . Esophageal reflux 11/15/2013  . Hypothyroid 06/11/2010  . Diverticular disease 06/11/2010  . Post-menopausal 06/11/2010    Current Outpatient Prescriptions  Medication Sig Dispense Refill  . Aspirin-Acetaminophen-Caffeine (GOODY HEADACHE PO) Take by mouth.    . benazepril-hydrochlorthiazide (LOTENSIN HCT) 20-25 MG per tablet Take 1 tablet by mouth daily. 90 tablet 3  . Iron Combinations (IRON COMPLEX PO) Take by mouth.    . levothyroxine (SYNTHROID, LEVOTHROID) 100 MCG tablet TAKE ONE TABLET BY MOUTH ONCE DAILY IN THE MORNING ON AN EMPTY SYOMACH 90 tablet 1  . mometasone (ELOCON) 0.1 % cream Apply 1 application topically daily. 45 g 0  . Multiple Vitamins-Minerals (ICAPS MV PO) Take by mouth.    . mupirocin ointment (BACTROBAN) 2 %     . pseudoephedrine (DECONGESTANT 12HOUR MAX ST) 120  MG 12 hr tablet Take 120 mg by mouth 2 (two) times daily.    . ranitidine (ZANTAC) 150 MG tablet TAKE ONE TABLET BY MOUTH TWICE DAILY AS NEEDED 60 tablet 5  . Triprolidine-Pseudoephedrine (ANTIHISTAMINE PO) Take 10 mg by mouth daily.    Marland Kitchen levocetirizine (XYZAL) 5 MG tablet Take 1 tablet (5 mg total) by mouth every evening. 30 tablet 2  . trimethoprim-polymyxin b (POLYTRIM) ophthalmic solution Place 1 drop into both eyes every 4 (four) hours. 10 mL 0   No current facility-administered medications for this visit.       Objective:    BP 137/74 mmHg  Pulse 88  Temp(Src) 97.1 F (36.2 C) (Oral)  Ht 5\' 4"  (1.626 m)  Wt 178 lb 12.8 oz (81.103 kg)  BMI 30.68 kg/m2  Wt Readings from Last 3 Encounters:  04/20/15 178 lb 12.8 oz (81.103 kg)  12/20/14 175 lb 6.4 oz (79.561 kg)  12/13/14 176 lb (79.833 kg)     Gen: NAD, alert, cooperative with exam, NCAT EYES: EOMI, R eye with injected conjunctivities, L eye slightly injected ENT:  R TM occluded by cerumen, L TM pearly gray portions able to see, OP without erythema LYMPH: no cervical LAD CV: NRRR, normal S1/S2, no murmur, distal pulses 2+ b/l Resp: CTABL, no wheezes, normal WOB Ext: No edema, warm Neuro: Alert and oriented, strength equal b/l UE and LE, coordination grossly normal MSK: normal muscle bulk  Assessment & Plan:    Eymi was seen today for eye drainage, right eye red and teeth hurt Friday, better today.  Diagnoses and all orders for this visit:  Allergic rhinitis, unspecified allergic rhinitis type -     levocetirizine (XYZAL) 5 MG tablet; Take 1 tablet (5 mg total) by mouth every evening.  Conjunctivitis of right eye -     trimethoprim-polymyxin b (POLYTRIM) ophthalmic solution; Place 1 drop into both eyes every 4 (four) hours.    Follow up plan: Return if symptoms worsen or fail to improve.  Assunta Found, MD [facility] 04/20/2015, 6:12 PM

## 2015-04-20 NOTE — Patient Instructions (Signed)
Take allergy medicine xyzal 5mg  daily  Use eye drops 3-4 times a day until symptoms improve

## 2015-05-22 ENCOUNTER — Encounter: Payer: Self-pay | Admitting: Family

## 2015-05-22 ENCOUNTER — Ambulatory Visit (INDEPENDENT_AMBULATORY_CARE_PROVIDER_SITE_OTHER): Payer: PPO | Admitting: Family

## 2015-05-22 VITALS — BP 135/81 | HR 98 | Temp 97.9°F | Ht 64.0 in | Wt 177.0 lb

## 2015-05-22 DIAGNOSIS — H1013 Acute atopic conjunctivitis, bilateral: Secondary | ICD-10-CM | POA: Diagnosis not present

## 2015-05-22 DIAGNOSIS — J309 Allergic rhinitis, unspecified: Secondary | ICD-10-CM | POA: Diagnosis not present

## 2015-05-22 DIAGNOSIS — E039 Hypothyroidism, unspecified: Secondary | ICD-10-CM

## 2015-05-22 DIAGNOSIS — K2100 Gastro-esophageal reflux disease with esophagitis, without bleeding: Secondary | ICD-10-CM

## 2015-05-22 DIAGNOSIS — I10 Essential (primary) hypertension: Secondary | ICD-10-CM

## 2015-05-22 DIAGNOSIS — D509 Iron deficiency anemia, unspecified: Secondary | ICD-10-CM

## 2015-05-22 MED ORDER — LEVOCETIRIZINE DIHYDROCHLORIDE 5 MG PO TABS: 5.0000 mg | ORAL_TABLET | Freq: Every evening | ORAL | Status: DC

## 2015-05-22 MED ORDER — OLOPATADINE HCL 0.2 % OP SOLN: 1.0000 [drp] | Freq: Every day | OPHTHALMIC | Status: DC

## 2015-05-22 MED ORDER — RANITIDINE HCL 150 MG PO TABS: ORAL_TABLET | ORAL | Status: DC

## 2015-05-22 NOTE — Patient Instructions (Signed)

## 2015-05-22 NOTE — Progress Notes (Signed)
Subjective:    Patient ID: Autumn Moyer, female    DOB: May 30, 1940, 75 y.o.   MRN: [id number]  Pt presents to the office today to recheck HTN. Pt's BP is at goal today. PT reports feeling "more tired" since increasing the HCTZ. Pt told to take BP med at night to see if she can tell a difference in her fatigue.  Hypertension This is a chronic problem. The current episode started more than 1 year ago. The problem has been resolved since onset. The problem is controlled. Associated symptoms include peripheral edema ("my feet at the end of the day"). Pertinent negatives include no anxiety, headaches, palpitations or shortness of breath. Risk factors for coronary artery disease include post-menopausal state, sedentary lifestyle, obesity and family history. Past treatments include ACE inhibitors and diuretics. There is no history of kidney disease, CAD/MI, CVA, heart failure or a thyroid problem.  Thyroid Problem Presents for follow-up visit. Symptoms include constipation and hoarse voice. Patient reports no depressed mood, dry skin, palpitations or visual change. The symptoms have been stable. Past treatments include levothyroxine. The treatment provided moderate relief. There is no history of heart failure.      Review of Systems  Constitutional: Negative.   HENT: Positive for hoarse voice.   Eyes: Negative.   Respiratory: Negative.  Negative for shortness of breath.   Cardiovascular: Negative.  Negative for palpitations.  Gastrointestinal: Positive for constipation.  Endocrine: Negative.   Genitourinary: Negative.   Musculoskeletal: Negative.   Neurological: Negative.  Negative for headaches.  Hematological: Negative.   Psychiatric/Behavioral: Negative.   All other systems reviewed and are negative.      Objective:   Physical Exam  Constitutional: She is oriented to person, place, and time. She appears well-developed and well-nourished. No distress.  HENT:  Head: Normocephalic and  atraumatic.  Right Ear: External ear normal.  Left Ear: External ear normal.  Nose: Nose normal.  Mouth/Throat: Oropharynx is clear and moist.  Eyes: Pupils are equal, round, and reactive to light.  Neck: Normal range of motion. Neck supple. No thyromegaly present.  Cardiovascular: Normal rate, regular rhythm, normal heart sounds and intact distal pulses.   No murmur heard. Pulmonary/Chest: Effort normal and breath sounds normal. No respiratory distress. She has no wheezes.  Abdominal: Soft. Bowel sounds are normal. She exhibits no distension. There is no tenderness.  Musculoskeletal: Normal range of motion. She exhibits no edema or tenderness.  Neurological: She is alert and oriented to person, place, and time. She has normal reflexes. No cranial nerve deficit.  Skin: Skin is warm and dry.  Psychiatric: She has a normal mood and affect. Her behavior is normal. Judgment and thought content normal.  Vitals reviewed.   BP 135/81 mmHg  Pulse 98  Temp(Src) 97.9 F (36.6 C) (Oral)  Ht 5' 4"  (1.626 m)  Wt 177 lb (80.287 kg)  BMI 30.37 kg/m2       Assessment & Plan:  1. Hypothyroidism, unspecified hypothyroidism type - CMP14+EGFR - Thyroid Panel With TSH  2. Essential hypertension, benign - CMP14+EGFR  3. Gastroesophageal reflux disease with esophagitis - CMP14+EGFR  4. Allergic rhinitis, unspecified allergic rhinitis type - CMP14+EGFR  5. Iron deficiency anemia - CMP14+EGFR - Anemia Profile B  6. Conjunctivitis, allergic, bilateral - Olopatadine HCl 0.2 % SOLN; Apply 1 drop to eye daily.  Dispense: 2.5 mL; Refill: 3   Continue all meds Labs pending Health Maintenance reviewed Diet and exercise encouraged RTO 6 months   Evelina Dun, FNP

## 2015-05-22 NOTE — Addendum Note (Signed)
Addended by: Evelina Dun A on: 05/22/2015 09:26 AM   Modules accepted: Orders, SmartSet

## 2015-05-23 LAB — CMP14+EGFR
A/G RATIO: 1.7 (ref 1.1–2.5)
ALT: 19 IU/L (ref 0–32)
AST: 16 IU/L (ref 0–40)
Albumin: 4.5 g/dL (ref 3.5–4.8)
Alkaline Phosphatase: 65 IU/L (ref 39–117)
BILIRUBIN TOTAL: 0.8 mg/dL (ref 0.0–1.2)
BUN/Creatinine Ratio: 15 (ref 11–26)
BUN: 13 mg/dL (ref 8–27)
CALCIUM: 9.6 mg/dL (ref 8.7–10.3)
CHLORIDE: 100 mmol/L (ref 96–106)
CO2: 22 mmol/L (ref 18–29)
Creatinine, Ser: 0.85 mg/dL (ref 0.57–1.00)
GFR calc Af Amer: 79 mL/min/{1.73_m2} (ref >59)
GFR, EST NON AFRICAN AMERICAN: 69 mL/min/{1.73_m2} (ref >59)
GLOBULIN, TOTAL: 2.7 g/dL (ref 1.5–4.5)
Glucose: 101 mg/dL — ABNORMAL HIGH (ref 65–99)
POTASSIUM: 4.2 mmol/L (ref 3.5–5.2)
SODIUM: 139 mmol/L (ref 134–144)
Total Protein: 7.2 g/dL (ref 6.0–8.5)

## 2015-05-23 LAB — ANEMIA PROFILE B
BASOS ABS: 0.1 10*3/uL (ref 0.0–0.2)
BASOS: 2 %
EOS (ABSOLUTE): 0.4 10*3/uL (ref 0.0–0.4)
Eos: 6 %
FERRITIN: 99 ng/mL (ref 15–150)
Folate: 15.9 ng/mL (ref >3.0)
HEMATOCRIT: 41.3 % (ref 34.0–46.6)
Hemoglobin: 14.1 g/dL (ref 11.1–15.9)
IMMATURE GRANS (ABS): 0 10*3/uL (ref 0.0–0.1)
IRON: 102 ug/dL (ref 27–139)
Immature Granulocytes: 1 %
Iron Saturation: 34 % (ref 15–55)
LYMPHS: 32 %
Lymphocytes Absolute: 2 10*3/uL (ref 0.7–3.1)
MCH: 29.9 pg (ref 26.6–33.0)
MCHC: 34.1 g/dL (ref 31.5–35.7)
MCV: 88 fL (ref 79–97)
MONOCYTES: 10 %
Monocytes Absolute: 0.6 10*3/uL (ref 0.1–0.9)
NEUTROS ABS: 3.2 10*3/uL (ref 1.4–7.0)
Neutrophils: 49 %
Platelets: 343 10*3/uL (ref 150–379)
RBC: 4.71 x10E6/uL (ref 3.77–5.28)
RDW: 13.6 % (ref 12.3–15.4)
RETIC CT PCT: 1.3 % (ref 0.6–2.6)
TIBC: 296 ug/dL (ref 250–450)
UIBC: 194 ug/dL (ref 118–369)
VITAMIN B 12: 339 pg/mL (ref 211–946)
WBC: 6.3 10*3/uL (ref 3.4–10.8)

## 2015-05-23 LAB — THYROID PANEL WITH TSH
Free Thyroxine Index: 2.2 (ref 1.2–4.9)
T3 Uptake Ratio: 26 % (ref 24–39)
T4 TOTAL: 8.3 ug/dL (ref 4.5–12.0)
TSH: 1.38 u[IU]/mL (ref 0.450–4.500)

## 2015-06-15 DIAGNOSIS — Z961 Presence of intraocular lens: Secondary | ICD-10-CM | POA: Diagnosis not present

## 2015-06-15 DIAGNOSIS — D3131 Benign neoplasm of right choroid: Secondary | ICD-10-CM | POA: Diagnosis not present

## 2015-08-25 ENCOUNTER — Other Ambulatory Visit: Payer: Self-pay | Admitting: Family

## 2015-09-29 ENCOUNTER — Other Ambulatory Visit: Payer: Self-pay | Admitting: Family Medicine

## 2015-09-29 DIAGNOSIS — Z1231 Encounter for screening mammogram for malignant neoplasm of breast: Secondary | ICD-10-CM

## 2015-10-01 ENCOUNTER — Telehealth: Payer: Self-pay | Admitting: Cardiology

## 2015-10-01 DIAGNOSIS — I350 Nonrheumatic aortic (valve) stenosis: Secondary | ICD-10-CM

## 2015-10-01 NOTE — Telephone Encounter (Signed)
Autumn Moyer is calling because she is supposed to have a echo repeated in one year , There are no orders for that can you please an order for the echo . Thanks

## 2015-10-01 NOTE — Telephone Encounter (Signed)
Advised patient if she did not hear from office to call back

## 2015-10-01 NOTE — Telephone Encounter (Signed)
Order placed and will send message to scheduling to get echo appointment scheduled  Patient aware

## 2015-10-05 ENCOUNTER — Other Ambulatory Visit (HOSPITAL_COMMUNITY): Payer: Medicare Other

## 2015-10-09 ENCOUNTER — Ambulatory Visit
Admission: RE | Admit: 2015-10-09 | Discharge: 2015-10-09 | Disposition: A | Discharge status: Home / Self Care | Payer: PPO | Source: Ambulatory Visit | Attending: Family Medicine | Admitting: Family Medicine

## 2015-10-09 DIAGNOSIS — Z1231 Encounter for screening mammogram for malignant neoplasm of breast: Secondary | ICD-10-CM

## 2015-10-14 ENCOUNTER — Ambulatory Visit (HOSPITAL_COMMUNITY)
Admission: RE | Admit: 2015-10-14 | Discharge: 2015-10-14 | Disposition: A | Discharge status: Home / Self Care | Payer: PPO | Source: Ambulatory Visit | Attending: Cardiology | Admitting: Cardiology

## 2015-10-14 DIAGNOSIS — I119 Hypertensive heart disease without heart failure: Secondary | ICD-10-CM | POA: Insufficient documentation

## 2015-10-14 DIAGNOSIS — I352 Nonrheumatic aortic (valve) stenosis with insufficiency: Secondary | ICD-10-CM | POA: Insufficient documentation

## 2015-10-14 DIAGNOSIS — I350 Nonrheumatic aortic (valve) stenosis: Secondary | ICD-10-CM

## 2015-10-14 LAB — ECHOCARDIOGRAM COMPLETE
AO mean calculated velocity dopler: 200 cm/s
AOPV: 0.48 m/s
AOVTI: 64.4 cm
AV Mean grad: 19 mmHg
AV Peak grad: 36 mmHg
AV VEL mean LVOT/AV: 0.43
AV peak Index: 0.78
AV vel: 1.52
AVA: 1.52 cm2
AVAREAMEANV: 1.34 cm2
AVAREAMEANVIN: 0.69 cm2/m2
AVAREAVTI: 1.51 cm2
AVAREAVTIIND: 0.79 cm2/m2
AVLVOTPG: 8 mmHg
AVPHT: 559 ms
AVPKVEL: 300 cm/s
CHL CUP MV DEC (S): 222
CHL CUP TV REG PEAK VELOCITY: 243 cm/s
E/e' ratio: 11.35
EWDT: 222 ms
FS: 33 % (ref 28–44)
IVS/LV PW RATIO, ED: 1.11
LA diam index: 1.19 cm/m2
LA vol A4C: 45.2 ml
LA vol index: 19 mL/m2
LA vol: 36.7 mL
LASIZE: 23 mm
LDCA: 3.14 cm2
LEFT ATRIUM END SYS DIAM: 23 mm
LV E/e' medial: 11.35
LV E/e'average: 11.35
LV PW d: 11.1 mm — AB (ref 0.6–1.1)
LV SIMPSON'S DISK: 56
LV TDI E'MEDIAL: 5.66
LV dias vol index: 25 mL/m2
LV dias vol: 48 mL (ref 46–106)
LV e' LATERAL: 6.96 cm/s
LVOT SV: 98 mL
LVOT VTI: 31.2 cm
LVOT diameter: 20 mm
LVOT peak VTI: 0.48 cm
LVOTPV: 144 cm/s
LVSYSVOL: 21 mL (ref 14–42)
LVSYSVOLIN: 11 mL/m2
Lateral S' vel: 12.6 cm/s
MV Peak grad: 2 mmHg
MVPKAVEL: 94.4 m/s
MVPKEVEL: 79 m/s
RV sys press: 27 mmHg
Stroke v: 27 ml
TAPSE: 24.8 mm
TDI e' lateral: 6.96
TRMAXVEL: 243 cm/s
Valve area index: 0.79

## 2015-10-14 NOTE — Progress Notes (Signed)
*  PRELIMINARY RESULTS* Echocardiogram 2D Echocardiogram has been performed.  Autumn Moyer 10/14/2015, 10:11 AM

## 2015-10-22 ENCOUNTER — Telehealth: Payer: Self-pay | Admitting: Cardiology

## 2015-10-22 NOTE — Telephone Encounter (Signed)
Returned call to patient, gave her results and change in follow up with Dr Percival Spanish. New recall entered into system. Echo forwarded to [facility] via EPIC.   Notes Recorded by Minus Breeding, MD on 10/18/2015 at 1:00 PM EDT She had moderate AS. I will follow this clinically and with follow up echo. Change follow up to six months. Call Autumn Moyer with the results and send results to Evelina Dun, FNP

## 2015-10-22 NOTE — Telephone Encounter (Signed)
Autumn Moyer is calling to get her Results from ultrasound .Marland Kitchen Please call   Thanks

## 2015-10-28 ENCOUNTER — Other Ambulatory Visit: Payer: Self-pay | Admitting: Family

## 2015-10-28 DIAGNOSIS — J309 Allergic rhinitis, unspecified: Secondary | ICD-10-CM

## 2015-10-28 DIAGNOSIS — I10 Essential (primary) hypertension: Secondary | ICD-10-CM

## 2015-11-27 ENCOUNTER — Ambulatory Visit (INDEPENDENT_AMBULATORY_CARE_PROVIDER_SITE_OTHER): Payer: PPO | Admitting: Family

## 2015-11-27 ENCOUNTER — Encounter: Payer: Self-pay | Admitting: Family

## 2015-11-27 VITALS — BP 135/77 | HR 82 | Temp 97.2°F | Ht 64.0 in | Wt 181.6 lb

## 2015-11-27 DIAGNOSIS — E039 Hypothyroidism, unspecified: Secondary | ICD-10-CM

## 2015-11-27 DIAGNOSIS — I10 Essential (primary) hypertension: Secondary | ICD-10-CM

## 2015-11-27 DIAGNOSIS — E669 Obesity, unspecified: Secondary | ICD-10-CM | POA: Insufficient documentation

## 2015-11-27 DIAGNOSIS — J309 Allergic rhinitis, unspecified: Secondary | ICD-10-CM | POA: Diagnosis not present

## 2015-11-27 DIAGNOSIS — E663 Overweight: Secondary | ICD-10-CM | POA: Insufficient documentation

## 2015-11-27 DIAGNOSIS — D509 Iron deficiency anemia, unspecified: Secondary | ICD-10-CM

## 2015-11-27 DIAGNOSIS — K2100 Gastro-esophageal reflux disease with esophagitis, without bleeding: Secondary | ICD-10-CM

## 2015-11-27 NOTE — Progress Notes (Signed)
Subjective:    Patient ID: Autumn Moyer, female    DOB: 06-26-1940, 75 y.o.   MRN: [id number]  Pt presents to the office today to recheck HTN. Pt's BP is at goal today. PT reports feeling "more tired" since increasing the HCTZ. Pt told to take BP med at night to see if she can tell a difference in her fatigue.  Hypertension  This is a chronic problem. The current episode started more than 1 year ago. The problem has been resolved since onset. The problem is controlled. Associated symptoms include malaise/fatigue and peripheral edema ("my feet at the end of the day"). Pertinent negatives include no anxiety, headaches, palpitations or shortness of breath. Risk factors for coronary artery disease include post-menopausal state, sedentary lifestyle, obesity and family history. Past treatments include ACE inhibitors and diuretics. There is no history of kidney disease, CAD/MI, CVA, heart failure or a thyroid problem.  Thyroid Problem  Presents for follow-up visit. Symptoms include constipation, heat intolerance and hoarse voice. Patient reports no depressed mood, dry skin, palpitations or visual change. The symptoms have been stable. Past treatments include levothyroxine. The treatment provided moderate relief. There is no history of heart failure.  Anemia  Presents for follow-up visit. Symptoms include malaise/fatigue. There has been no bruising/bleeding easily, confusion or palpitations. There is no history of heart failure.  Gastroesophageal Reflux  She complains of a hoarse voice. She reports no choking or no heartburn. This is a chronic problem. The current episode started more than 1 year ago. The problem occurs rarely. The problem has been waxing and waning. The symptoms are aggravated by lying down and certain foods. Risk factors include obesity. She has tried a PPI for the symptoms. The treatment provided significant relief.      Review of Systems  Constitutional: Positive for  malaise/fatigue.  HENT: Positive for hoarse voice.   Eyes: Negative.   Respiratory: Negative.  Negative for choking and shortness of breath.   Cardiovascular: Negative.  Negative for palpitations.  Gastrointestinal: Positive for constipation. Negative for heartburn.  Endocrine: Positive for heat intolerance.  Genitourinary: Negative.   Musculoskeletal: Negative.   Neurological: Negative.  Negative for headaches.  Hematological: Negative.  Does not bruise/bleed easily.  Psychiatric/Behavioral: Negative.  Negative for confusion.  All other systems reviewed and are negative.      Objective:   Physical Exam  Constitutional: She is oriented to person, place, and time. She appears well-developed and well-nourished. No distress.  HENT:  Head: Normocephalic and atraumatic.  Right Ear: External ear normal.  Left Ear: External ear normal.  Nose: Nose normal.  Mouth/Throat: Oropharynx is clear and moist.  Eyes: Pupils are equal, round, and reactive to light.  Neck: Normal range of motion. Neck supple. No thyromegaly present.  Cardiovascular: Normal rate, regular rhythm and intact distal pulses.   Murmur heard. Pulmonary/Chest: Effort normal and breath sounds normal. No respiratory distress. She has no wheezes.  Abdominal: Soft. Bowel sounds are normal. She exhibits no distension. There is no tenderness.  Musculoskeletal: Normal range of motion. She exhibits no edema or tenderness.  Neurological: She is alert and oriented to person, place, and time. She has normal reflexes. No cranial nerve deficit.  Skin: Skin is warm and dry.  Psychiatric: She has a normal mood and affect. Her behavior is normal. Judgment and thought content normal.  Vitals reviewed.   BP 135/77   Pulse 82   Temp 97.2 F (36.2 C) (Oral)   Ht _0  (1.626 m)  Wt 181 lb 9.6 oz (82.4 kg)   BMI 31.17 kg/m        Assessment & Plan:  1. Essential hypertension, benign - CMP14+EGFR  2. Allergic rhinitis,  unspecified allergic rhinitis type - CMP14+EGFR  3. Gastroesophageal reflux disease with esophagitis - CMP14+EGFR  4. Hypothyroidism, unspecified hypothyroidism type - CMP14+EGFR - Thyroid Panel With TSH  5. Iron deficiency anemia - CMP14+EGFR  6. Obesity (BMI 30.0-34.9) - CMP14+EGFR   Continue all meds Labs pending Health Maintenance reviewed Diet and exercise encouraged RTO 6 months  Evelina Dun, FNP

## 2015-11-27 NOTE — Patient Instructions (Signed)

## 2015-11-28 LAB — CMP14+EGFR
ALT: 16 IU/L (ref 0–32)
AST: 20 IU/L (ref 0–40)
Albumin/Globulin Ratio: 1.4 (ref 1.2–2.2)
Albumin: 4.2 g/dL (ref 3.5–4.8)
Alkaline Phosphatase: 63 IU/L (ref 39–117)
BUN/Creatinine Ratio: 15 (ref 12–28)
BUN: 13 mg/dL (ref 8–27)
Bilirubin Total: 0.7 mg/dL (ref 0.0–1.2)
CALCIUM: 9.5 mg/dL (ref 8.7–10.3)
CHLORIDE: 102 mmol/L (ref 96–106)
CO2: 25 mmol/L (ref 18–29)
CREATININE: 0.86 mg/dL (ref 0.57–1.00)
GFR, EST AFRICAN AMERICAN: 78 mL/min/{1.73_m2} (ref >59)
GFR, EST NON AFRICAN AMERICAN: 67 mL/min/{1.73_m2} (ref >59)
GLUCOSE: 102 mg/dL — AB (ref 65–99)
Globulin, Total: 2.9 g/dL (ref 1.5–4.5)
Potassium: 4.3 mmol/L (ref 3.5–5.2)
Sodium: 142 mmol/L (ref 134–144)
TOTAL PROTEIN: 7.1 g/dL (ref 6.0–8.5)

## 2015-11-28 LAB — THYROID PANEL WITH TSH
FREE THYROXINE INDEX: 2.1 (ref 1.2–4.9)
T3 UPTAKE RATIO: 26 % (ref 24–39)
T4, Total: 7.9 ug/dL (ref 4.5–12.0)
TSH: 1.4 u[IU]/mL (ref 0.450–4.500)

## 2016-01-05 ENCOUNTER — Ambulatory Visit (INDEPENDENT_AMBULATORY_CARE_PROVIDER_SITE_OTHER): Payer: PPO

## 2016-01-05 DIAGNOSIS — Z23 Encounter for immunization: Secondary | ICD-10-CM

## 2016-01-25 ENCOUNTER — Other Ambulatory Visit: Payer: Self-pay | Admitting: Family

## 2016-01-25 DIAGNOSIS — J309 Allergic rhinitis, unspecified: Secondary | ICD-10-CM

## 2016-01-26 ENCOUNTER — Other Ambulatory Visit: Payer: Self-pay | Admitting: Family

## 2016-01-26 DIAGNOSIS — I10 Essential (primary) hypertension: Secondary | ICD-10-CM

## 2016-04-18 ENCOUNTER — Encounter: Payer: Self-pay | Admitting: Family Medicine

## 2016-04-18 ENCOUNTER — Telehealth: Payer: Self-pay | Admitting: Family

## 2016-04-18 ENCOUNTER — Ambulatory Visit (INDEPENDENT_AMBULATORY_CARE_PROVIDER_SITE_OTHER): Payer: PPO | Admitting: Family Medicine

## 2016-04-18 ENCOUNTER — Ambulatory Visit (INDEPENDENT_AMBULATORY_CARE_PROVIDER_SITE_OTHER): Payer: PPO

## 2016-04-18 VITALS — BP 170/92 | HR 85 | Temp 98.8°F | Ht 64.0 in | Wt 183.0 lb

## 2016-04-18 DIAGNOSIS — M25561 Pain in right knee: Secondary | ICD-10-CM

## 2016-04-18 MED ORDER — METHYLPREDNISOLONE ACETATE 80 MG/ML IJ SUSP
80.0000 mg | Freq: Once | INTRAMUSCULAR | Status: AC
  Administered 2016-04-18: 80 mg via INTRAMUSCULAR

## 2016-04-18 NOTE — Progress Notes (Signed)
BP (!) 170/92   Pulse 85   Temp 98.8 F (37.1 C) (Oral)   Ht 5\' 4"  (1.626 m)   Wt 183 lb (83 kg)   BMI 31.41 kg/m    Subjective:    Patient ID: Autumn Moyer, female    DOB: 1940-12-01, 76 y.o.   MRN: [id number]  HPI: Autumn Moyer is a 76 y.o. female presenting on 04/18/2016 for Knee Pain (right. Patient states two weeks ago she heard it pop.)   HPI Right knee pain Patient has been having right knee pain for the past 2 weeks. She says she was in her house cleaning the curtains and felt a pop in her knee. She cannot recall any specific twisting movement or fall or any movement that was abnormal but specifically brought it on but she felt that pop in her right knee and ever since then she's been having the knee pain. She rates the knee pain as moderate and on the anterior medial side of her knee. She denies any fevers or chills. She denies any pain radiating anywhere else. She denies any numbness or weakness in that leg.  Relevant past medical, surgical, family and social history reviewed and updated as indicated. Interim medical history since our last visit reviewed. Allergies and medications reviewed and updated.  Review of Systems  Constitutional: Negative for chills and fever.  Respiratory: Negative for chest tightness and shortness of breath.   Cardiovascular: Negative for chest pain and leg swelling.  Musculoskeletal: Positive for arthralgias. Negative for back pain, gait problem and joint swelling.  Skin: Negative for color change and rash.  Neurological: Negative for light-headedness and headaches.  Psychiatric/Behavioral: Negative for agitation and behavioral problems.  All other systems reviewed and are negative.   Per HPI unless specifically indicated above  Knee injection: Risk factors of bleeding and infection discussed with patient and patient is agreeable towards injection. Patient prepped with Betadine. Lateral approach towards injection used. Injected 40 mg of  Depo-Medrol and 1 mL of 2% lidocaine. Patient tolerated procedure well and no side effects from noted. Minimal to no bleeding. Simple bandage applied after.     Objective:    BP (!) 170/92   Pulse 85   Temp 98.8 F (37.1 C) (Oral)   Ht 5\' 4"  (1.626 m)   Wt 183 lb (83 kg)   BMI 31.41 kg/m   Wt Readings from Last 3 Encounters:  04/18/16 183 lb (83 kg)  11/27/15 181 lb 9.6 oz (82.4 kg)  05/22/15 177 lb (80.3 kg)    Physical Exam  Constitutional: She is oriented to person, place, and time. She appears well-developed and well-nourished. No distress.  Eyes: Conjunctivae are normal.  Musculoskeletal: Normal range of motion. She exhibits no edema.       Right knee: She exhibits normal range of motion, no swelling, normal alignment, no LCL laxity, normal patellar mobility, normal meniscus and no MCL laxity. Tenderness found. Medial joint line tenderness noted.       Legs: Neurological: She is alert and oriented to person, place, and time. Coordination normal.  Skin: Skin is warm and dry. No rash noted. She is not diaphoretic.  Psychiatric: She has a normal mood and affect. Her behavior is normal.  Nursing note and vitals reviewed.  Knee injection: Risk factors of bleeding and infection discussed with patient and patient is agreeable towards injection. Patient prepped with Betadine. Lateral approach towards injection used. Injected 80 mg of Depo-Medrol and 1 mL of 2%  lidocaine. Patient tolerated procedure well and no side effects from noted. Minimal to no bleeding. Simple bandage applied after.     Assessment & Plan:   Problem List Items Addressed This Visit    None    Visit Diagnoses    Right knee pain, unspecified chronicity    -  Primary   Relevant Medications   methylPREDNISolone acetate (DEPO-MEDROL) injection 80 mg (Completed)   Other Relevant Orders   DG Knee 1-2 Views Right (Completed)       Follow up plan: Return if symptoms worsen or fail to improve.  Counseling  provided for all of the vaccine components Orders Placed This Encounter  Procedures  . DG Knee 1-2 Views Right    Caryl Pina, MD [facility] 04/18/2016, 6:41 PM

## 2016-04-18 NOTE — Telephone Encounter (Signed)
Appt made for this evening

## 2016-04-26 NOTE — Progress Notes (Signed)
Cardiology Office Note   Date:  04/28/2016   ID:  Gracin, Abramyan Sep 05, 1940, MRN [id number]  PCP:  Evelina Dun, FNP  Cardiologist:   Minus Breeding, MD   Chief Complaint  Patient presents with  . Aortic Stenosis      History of Present Illness: Autumn Moyer is a 76 y.o. female who presents for evaluation of aortic stenosis.  I saw her for the first time last year and she had an echo which demonstrated moderate AS.  She presents now with shortness of breath. She says this is happening when she climbs stairs or makes the bed. She'll get winded and will be able to keep going but it takes her several minutes to recover. She's not describing resting shortness of breath. She's not having any PND or orthopnea. She's not having any palpitations, presyncope or syncope.  Past Medical History:  Diagnosis Date  . Hypertension   . Thyroid disease     Past Surgical History:  Procedure Laterality Date  . EYE SURGERY    . TOE SURGERY    . TUBAL LIGATION       Current Outpatient Prescriptions  Medication Sig Dispense Refill  . Aspirin-Acetaminophen-Caffeine (GOODY HEADACHE PO) Take by mouth.    . benazepril-hydrochlorthiazide (LOTENSIN HCT) 20-25 MG tablet TAKE ONE TABLET BY MOUTH ONCE DAILY 90 tablet 1  . levocetirizine (XYZAL) 5 MG tablet TAKE ONE TABLET BY MOUTH ONCE DAILY IN THE EVENING 30 tablet 6  . levothyroxine (SYNTHROID, LEVOTHROID) 100 MCG tablet TAKE ONE TABLET BY MOUTH IN THE MORNING ON EMPTY STOMACH 90 tablet 2  . Lysine 500 MG TABS Take by mouth.    . mometasone (ELOCON) 0.1 % cream Apply 1 application topically daily. 45 g 0  . pseudoephedrine (DECONGESTANT 12HOUR MAX ST) 120 MG 12 hr tablet Take 120 mg by mouth 2 (two) times daily.    . ranitidine (ZANTAC) 150 MG tablet TAKE ONE TABLET BY MOUTH TWICE DAILY AS NEEDED 60 tablet 5   No current facility-administered medications for this visit.     Allergies:   Noroxin [norfloxacin]    ROS:  Please see the  history of present illness.   Otherwise, review of systems are positive for knee pain on the right. .   All other systems are reviewed and negative.    PHYSICAL EXAM: VS:  BP (!) 144/78   Pulse 96   Ht 5\' 4"  (1.626 m)   Wt 182 lb (82.6 kg)   BMI 31.24 kg/m  , BMI Body mass index is 31.24 kg/m. GENERAL:  Well appearing HEENT:  Pupils equal round and reactive, fundi not visualized, oral mucosa unremarkable LUNGS:  Clear to auscultation bilaterally BACK:  No CVA tenderness CHEST:  Unremarkable HEART:  PMI not displaced or sustained,S1 and S2 within normal limits, no S3, no S4, no clicks, no rubs, 2 out of 6 apical systolic murmur radiating out the aortic outflow tract and early to mid peaking, no diastolic murmurs ABD:  Flat, positive bowel sounds normal in frequency in pitch, no bruits, no rebound, no guarding, no midline pulsatile mass, no hepatomegaly, no splenomegaly EXT:  2 plus pulses throughout, no edema, no cyanosis no clubbing SKIN:  No rashes no nodules   EKG:  EKG is  ordered today. The ekg ordered today demonstrates sinus rhythm, rate 100, axis within normal limits, intervals within normal limits, nonspecific T-wave inversion in the inferior leads. This is slightly more pronounced than previous.   Recent  Labs: 05/22/2015: Platelets 343 11/27/2015: ALT 16; BUN 13; Creatinine, Ser 0.86; Potassium 4.3; Sodium 142; TSH 1.400    Lipid Panel No results found for: CHOL, TRIG, HDL, CHOLHDL, VLDL, LDLCALC, LDLDIRECT    Wt Readings from Last 3 Encounters:  04/27/16 182 lb (82.6 kg)  04/18/16 183 lb (83 kg)  11/27/15 181 lb 9.6 oz (82.4 kg)      Other studies Reviewed: Additional studies/ records that were reviewed today include: None Review of the above records demonstrates:      ASSESSMENT AND PLAN:  AS:   This was moderate on echo in July of last year.  I don't suspect this is causing her shortness of breath. Can check a BNP level.  She does have some mild EKG changes.  I'm going to do a perfusion study as she would be able to walk on a treadmill. She's been having some knee pain area  HTN:  The blood pressure is at target. No change in medications is indicated. We will continue with therapeutic lifestyle changes (TLC).  QT:   This is mildly prolonged but it hasn't been before.  Current medicines are reviewed at length with the patient today.  The patient does not have concerns regarding medicines.  The following changes have been made:  no change  Labs/ tests ordered today include:   I'll keep an eye on this. She's not having any symptoms. Orders Placed This Encounter  Procedures  . BNP Lexiscan Myoview.     Disposition:   FU with me in after the POET (Plain [employer] Exercise Treadmill)  Signed, Minus Breeding, MD  04/28/2016 8:12 AM    [place]

## 2016-04-27 ENCOUNTER — Other Ambulatory Visit: Payer: Self-pay | Admitting: Family

## 2016-04-27 ENCOUNTER — Other Ambulatory Visit: Payer: PPO

## 2016-04-27 ENCOUNTER — Encounter: Payer: Self-pay | Admitting: Cardiology

## 2016-04-27 ENCOUNTER — Ambulatory Visit (INDEPENDENT_AMBULATORY_CARE_PROVIDER_SITE_OTHER): Payer: PPO | Admitting: Cardiology

## 2016-04-27 VITALS — BP 144/78 | HR 96 | Ht 64.0 in | Wt 182.0 lb

## 2016-04-27 DIAGNOSIS — I10 Essential (primary) hypertension: Secondary | ICD-10-CM

## 2016-04-27 DIAGNOSIS — R0602 Shortness of breath: Secondary | ICD-10-CM | POA: Diagnosis not present

## 2016-04-27 DIAGNOSIS — J309 Allergic rhinitis, unspecified: Secondary | ICD-10-CM

## 2016-04-27 NOTE — Patient Instructions (Signed)
Medication Instructions:  The current medical regimen is effective;  continue present plan and medications.  Labwork: Please have blood work today at [facility]  (BNP)  Testing/Procedures: Your physician has requested that you have a lexiscan myoview. For further information please visit [email or web address]. Please follow instruction sheet, as given.  Follow-Up: Follow up after after your testing.  If you need a refill on your cardiac medications before your next appointment, please call your pharmacy.  Thank you for choosing [facility]!!

## 2016-04-28 LAB — BRAIN NATRIURETIC PEPTIDE: BNP: 18.5 pg/mL (ref 0.0–100.0)

## 2016-05-03 ENCOUNTER — Telehealth (HOSPITAL_COMMUNITY): Payer: Self-pay | Admitting: *Deleted

## 2016-05-03 NOTE — Telephone Encounter (Signed)
Patient given detailed instructions per Myocardial Perfusion Study Information Sheet for the test on 05/05/16 at 1000. Patient notified to arrive 15 minutes early and that it is imperative to arrive on time for appointment to keep from having the test rescheduled.  If you need to cancel or reschedule your appointment, please call the office within 24 hours of your appointment. Failure to do so may result in a cancellation of your appointment, and a $50 no show fee. Patient verbalized understanding.Benelli Winther, [place]anae [place]

## 2016-05-05 ENCOUNTER — Ambulatory Visit (HOSPITAL_COMMUNITY): Payer: PPO | Attending: Cardiovascular Disease

## 2016-05-05 DIAGNOSIS — I350 Nonrheumatic aortic (valve) stenosis: Secondary | ICD-10-CM | POA: Diagnosis not present

## 2016-05-05 DIAGNOSIS — R9431 Abnormal electrocardiogram [ECG] [EKG]: Secondary | ICD-10-CM | POA: Diagnosis not present

## 2016-05-05 DIAGNOSIS — R0609 Other forms of dyspnea: Secondary | ICD-10-CM | POA: Diagnosis not present

## 2016-05-05 DIAGNOSIS — I10 Essential (primary) hypertension: Secondary | ICD-10-CM

## 2016-05-05 DIAGNOSIS — R0602 Shortness of breath: Secondary | ICD-10-CM | POA: Diagnosis not present

## 2016-05-05 LAB — MYOCARDIAL PERFUSION IMAGING
CHL CUP NUCLEAR SDS: 1
CHL CUP RESTING HR STRESS: 88 {beats}/min
LHR: 0.3
LV dias vol: 72 mL (ref 46–106)
LV sys vol: 20 mL
Peak HR: 108 {beats}/min
SRS: 4
SSS: 5
TID: 0.91

## 2016-05-05 MED ORDER — REGADENOSON 0.4 MG/5ML IV SOLN
0.4000 mg | Freq: Once | INTRAVENOUS | Status: AC
  Administered 2016-05-05: 0.4 mg via INTRAVENOUS

## 2016-05-05 MED ORDER — TECHNETIUM TC 99M TETROFOSMIN IV KIT
10.2000 | PACK | Freq: Once | INTRAVENOUS | Status: AC | PRN
  Administered 2016-05-05: 10.2 via INTRAVENOUS
  Filled 2016-05-05: qty 11

## 2016-05-05 MED ORDER — TECHNETIUM TC 99M TETROFOSMIN IV KIT
32.1000 | PACK | Freq: Once | INTRAVENOUS | Status: AC | PRN
  Administered 2016-05-05: 32.1 via INTRAVENOUS
  Filled 2016-05-05: qty 33

## 2016-05-07 ENCOUNTER — Other Ambulatory Visit: Payer: Self-pay | Admitting: Family

## 2016-05-07 DIAGNOSIS — K2100 Gastro-esophageal reflux disease with esophagitis, without bleeding: Secondary | ICD-10-CM

## 2016-05-12 ENCOUNTER — Telehealth: Payer: Self-pay | Admitting: Cardiology

## 2016-05-12 NOTE — Telephone Encounter (Signed)
Returned call to patient + provided stress test results  Patient states she is upset that she has had to contact our office to inquire about her test results, recalls for testing & appointments. She states she was told she would have her stress test results in no more than 1 week. Resulted on 05/05/16 in [place], ordered when patient was seen in [place]. Per AVS, patient was to follow up with MD - scheduled to see MD first available 3/12 in [place] w/patient.  Apologized that patient has had to initiate outreach to our practice for these matters.   Routed to MD as Juluis Rainier

## 2016-05-12 NOTE — Telephone Encounter (Signed)
Patient is calling in regards to echo results. Please call to discuss, thanks.

## 2016-05-14 NOTE — Telephone Encounter (Signed)
Not sure why it wasn't resulted I reviewed it on the 16 th.

## 2016-05-20 ENCOUNTER — Other Ambulatory Visit: Payer: Self-pay | Admitting: Family

## 2016-05-26 ENCOUNTER — Ambulatory Visit (INDEPENDENT_AMBULATORY_CARE_PROVIDER_SITE_OTHER): Payer: PPO | Admitting: *Deleted

## 2016-05-26 DIAGNOSIS — Z23 Encounter for immunization: Secondary | ICD-10-CM

## 2016-05-26 NOTE — Progress Notes (Signed)
Pt has finger lac Needs Tdap Immunization given Pt tolerated well

## 2016-05-30 ENCOUNTER — Ambulatory Visit: Payer: PPO | Admitting: Cardiology

## 2016-06-13 ENCOUNTER — Ambulatory Visit (INDEPENDENT_AMBULATORY_CARE_PROVIDER_SITE_OTHER): Payer: PPO

## 2016-06-13 ENCOUNTER — Ambulatory Visit (INDEPENDENT_AMBULATORY_CARE_PROVIDER_SITE_OTHER): Payer: PPO | Admitting: Pediatrics

## 2016-06-13 ENCOUNTER — Encounter: Payer: Self-pay | Admitting: Pediatrics

## 2016-06-13 VITALS — BP 155/88 | HR 77 | Temp 96.9°F | Resp 22 | Ht 64.0 in | Wt 187.2 lb

## 2016-06-13 DIAGNOSIS — R739 Hyperglycemia, unspecified: Secondary | ICD-10-CM | POA: Diagnosis not present

## 2016-06-13 DIAGNOSIS — J181 Lobar pneumonia, unspecified organism: Secondary | ICD-10-CM | POA: Diagnosis not present

## 2016-06-13 DIAGNOSIS — I350 Nonrheumatic aortic (valve) stenosis: Secondary | ICD-10-CM | POA: Diagnosis not present

## 2016-06-13 DIAGNOSIS — I10 Essential (primary) hypertension: Secondary | ICD-10-CM

## 2016-06-13 DIAGNOSIS — J189 Pneumonia, unspecified organism: Secondary | ICD-10-CM

## 2016-06-13 DIAGNOSIS — R0600 Dyspnea, unspecified: Secondary | ICD-10-CM

## 2016-06-13 LAB — CBC WITH DIFFERENTIAL/PLATELET
BASOS ABS: 0.1 10*3/uL (ref 0.0–0.2)
Basos: 1 %
EOS (ABSOLUTE): 0.2 10*3/uL (ref 0.0–0.4)
EOS: 2 %
HEMATOCRIT: 41.7 % (ref 34.0–46.6)
HEMOGLOBIN: 13.6 g/dL (ref 11.1–15.9)
IMMATURE GRANS (ABS): 0.1 10*3/uL (ref 0.0–0.1)
Immature Granulocytes: 1 %
LYMPHS ABS: 2 10*3/uL (ref 0.7–3.1)
LYMPHS: 20 %
MCH: 29 pg (ref 26.6–33.0)
MCHC: 32.6 g/dL (ref 31.5–35.7)
MCV: 89 fL (ref 79–97)
MONOCYTES: 8 %
Monocytes Absolute: 0.8 10*3/uL (ref 0.1–0.9)
NEUTROS ABS: 6.8 10*3/uL (ref 1.4–7.0)
Neutrophils: 68 %
PLATELETS: 341 10*3/uL (ref 150–379)
RBC: 4.69 x10E6/uL (ref 3.77–5.28)
RDW: 14.5 % (ref 12.3–15.4)
WBC: 10 10*3/uL (ref 3.4–10.8)

## 2016-06-13 LAB — BMP8+EGFR
BUN / CREAT RATIO: 17 (ref 12–28)
BUN: 16 mg/dL (ref 8–27)
CALCIUM: 9.7 mg/dL (ref 8.7–10.3)
CHLORIDE: 100 mmol/L (ref 96–106)
CO2: 24 mmol/L (ref 18–29)
CREATININE: 0.94 mg/dL (ref 0.57–1.00)
GFR calc non Af Amer: 60 mL/min/{1.73_m2} (ref >59)
GFR, EST AFRICAN AMERICAN: 70 mL/min/{1.73_m2} (ref >59)
Glucose: 115 mg/dL — ABNORMAL HIGH (ref 65–99)
Potassium: 3.9 mmol/L (ref 3.5–5.2)
Sodium: 141 mmol/L (ref 134–144)

## 2016-06-13 LAB — BAYER DCA HB A1C WAIVED: HB A1C (BAYER DCA - WAIVED): 6 % (ref <7.0)

## 2016-06-13 MED ORDER — LEVOFLOXACIN 500 MG PO TABS: 500.0000 mg | ORAL_TABLET | Freq: Every day | ORAL | 0 refills | Status: DC

## 2016-06-13 NOTE — Addendum Note (Signed)
Addended by: Eustaquio Maize on: 06/13/2016 12:14 PM   Modules accepted: Orders

## 2016-06-13 NOTE — Progress Notes (Addendum)
Subjective:   Patient ID: Autumn Moyer, female    DOB: 09-05-1940, 76 y.o.   MRN: [id number] CC: Follow-up (6 month) and Shortness of Breath (On exertion)  HPI: Autumn Moyer is a 76 y.o. female presenting for Follow-up (6 month) and Shortness of Breath (On exertion)  She has had more SOB with exertion since last URI 2 months ago Had a stress test for these symptoms 1 mo ago, was negative Has intermittent aches and pains, b/l shoulder pain, pain under L breast only when she leans over, cramps in her legs that come and go Often will feel lightheaded when she stands up too soon She has symptoms going on since Thanksgiving, had episode where she had to sit on the bathroom floor due to dizziness she thought because she stood up too fast. Was back to her normal self after a few minutes Bending repetitively such as picking up sticks in front yard also causes some SOB  Normal stooling, no blood or dark colored stools Still taking iron at times  Relevant past medical, surgical, family and social history reviewed. Allergies and medications reviewed and updated. History  Smoking Status  . Never Smoker  Smokeless Tobacco  . Never Used   ROS: Per HPI   Objective:    BP (!) 148/81   Pulse 80   Temp (!) 96.9 F (36.1 C) (Oral)   Resp (!) 22   Ht 5' 4"  (1.626 m)   Wt 187 lb 3.2 oz (84.9 kg)   SpO2 98%   BMI 32.13 kg/m   Wt Readings from Last 3 Encounters:  06/13/16 187 lb 3.2 oz (84.9 kg)  05/05/16 182 lb (82.6 kg)  04/27/16 182 lb (82.6 kg)    O2 sats dropped to 91% after 1.5 min of walking, stayed there until she sat back down and rested  Gen: NAD, alert, cooperative with exam, NCAT EYES: EOMI, no conjunctival injection, or no icterus ENT:  TMs obscured with cerumen b/l, OP without erythema LYMPH: no cervical LAD CV: NRRR, normal S1/S2, no murmur, distal pulses 2+ b/l Resp: CTABL, no wheezes, normal WOB, moving air well Abd: +BS, soft, NTND. no guarding or organomegaly Ext:  No edema, warm Neuro: Alert and oriented MSK: mild anterior chest wall tenderness with palpation  Assessment & Plan:  Autumn Moyer was seen today for follow-up and shortness of breath.  Diagnoses and all orders for this visit:  Dyspnea, unspecified type Recent negative stress test in 04/2016 Normal lung exam today Did have O2 drop to low 90s with exertion Long history of passive smoke exposure Symptoms bothering her most days, minimally active Will get CXR, labs today. Refer to pulm if symptoms cont. -     DG Chest 2 View; Future -     BMP8+EGFR -     CBC with Differential/Platelet  Hyperglycemia Persistently slightly elevated glucose -     Bayer DCA Hb A1c Waived  Essential hypertension, benign BPs drop from 155/88 lying to 135/74 with standing Asymptomatic today Pt to continue medications, get cuff to check every morning and before dinner or when she feels lightheaded  Aortic valve stenosis, etiology of cardiac valve disease unspecified Moderate Following with cardiology  Follow up plan: Return in about 4 weeks (around 07/11/2016). Assunta Found, MD Tristan Schroeder [facility]  ADDENDUM: CXR with large hiatal hernia and L lower lobe opacity. Pain under L breast as above, will treat for CAP with levofloxacin. RTC 3-4 weeks for repeat CXR, sooner if symptoms worsen.

## 2016-06-13 NOTE — Patient Instructions (Signed)
Check blood pressures morning and night

## 2016-06-29 ENCOUNTER — Ambulatory Visit (INDEPENDENT_AMBULATORY_CARE_PROVIDER_SITE_OTHER): Payer: PPO | Admitting: Pediatrics

## 2016-06-29 ENCOUNTER — Encounter: Payer: Self-pay | Admitting: Pediatrics

## 2016-06-29 VITALS — BP 143/84 | HR 80 | Temp 97.7°F | Resp 16 | Ht 64.0 in | Wt 188.6 lb

## 2016-06-29 DIAGNOSIS — R0600 Dyspnea, unspecified: Secondary | ICD-10-CM

## 2016-06-29 DIAGNOSIS — J189 Pneumonia, unspecified organism: Secondary | ICD-10-CM

## 2016-06-29 DIAGNOSIS — K219 Gastro-esophageal reflux disease without esophagitis: Secondary | ICD-10-CM

## 2016-06-29 DIAGNOSIS — I10 Essential (primary) hypertension: Secondary | ICD-10-CM

## 2016-06-29 DIAGNOSIS — R7303 Prediabetes: Secondary | ICD-10-CM | POA: Insufficient documentation

## 2016-06-29 MED ORDER — PANTOPRAZOLE SODIUM 40 MG PO TBEC: 40.0000 mg | DELAYED_RELEASE_TABLET | Freq: Every day | ORAL | 3 refills | Status: DC

## 2016-06-29 NOTE — Progress Notes (Signed)
  Subjective:   Patient ID: [name], female    DOB: August 21, 1940, 76 y.o.   MRN: [id number] CC: Follow-up (2 week, pneumonia)  HPI: EULOGIA DISMORE is a 76 y.o. female presenting for Follow-up (2 week, pneumonia)  Seen two weeks ago for SOB, fatigue Found to have infiltrate on xray, treated with levofloxacin for pneumonia Felt bad for the next few days but has been feeling better since then Has been walking some, thinks she is feeling better Yesterday and today were fairly good days No coughing, no fevers No chest pain  Does have some slight swelling in b/l ankles that is new  GERD: still with symptoms on ranitidine several days Tried PPI at home, helped significantly with her symptoms  Stress test normal 04/2016  Relevant past medical, surgical, family and social history reviewed. Allergies and medications reviewed and updated. History  Smoking Status  . Never Smoker  Smokeless Tobacco  . Never Used   ROS: Per HPI   Objective:    BP (!) 143/84   Pulse 80   Temp 97.7 F (36.5 C) (Oral)   Resp 16   Ht 5\' 4"  (1.626 m)   Wt 188 lb 9.6 oz (85.5 kg)   SpO2 92%   BMI 32.37 kg/m   Wt Readings from Last 3 Encounters:  06/29/16 188 lb 9.6 oz (85.5 kg)  06/13/16 187 lb 3.2 oz (84.9 kg)  05/05/16 182 lb (82.6 kg)    Gen: NAD, alert, cooperative with exam, NCAT EYES: EOMI, no conjunctival injection, or no icterus ENT:  OP without erythema LYMPH: no cervical LAD CV: NRRR, normal S1/S2, USB II/VI systolic ejection murmur, distal pulses 2+ b/l Resp: CTABL, no wheezes, normal WOB, no crackles Abd: +BS, soft, NTND. no guarding or organomegaly Ext: trace pitting edema b/l ankles, warm Neuro: Alert and oriented, strength equal b/l UE and LE, coordination grossly normal MSK: normal muscle bulk  Assessment & Plan:  Mayla was seen today for follow-up CAP, breathing.  Diagnoses and all orders for this visit:  Gastroesophageal reflux disease, esophagitis presence not  specified Trial of below for 2 mo, then will try to go back to H2-blocker -     pantoprazole (PROTONIX) 40 MG tablet; Take 1 tablet (40 mg total) by mouth daily.  Dyspnea, unspecified type Symptoms improved with treatment of CAP O2 level low 90s Normal stress test 2 months ago Reassess symptoms next visit  Community acquired pneumonia of left lung, unspecified part of lung Improved  Repeat CXR in 3 weeks  Essential hypertension, benign Bps range from 104-160s at home, DBPs 60s-80s at home Pt's personal machine 165/92 to our machine 145/87 this morning Cont current medications Labs from last visit normal  Prediabetes A1c 6.0 last visit Decrease sugar intake, goal 5 lbs weight loss over next few months  Follow up plan: As scheduled 3 weeks Assunta Found, MD [place]

## 2016-07-18 ENCOUNTER — Ambulatory Visit (INDEPENDENT_AMBULATORY_CARE_PROVIDER_SITE_OTHER): Payer: PPO | Admitting: Pediatrics

## 2016-07-18 ENCOUNTER — Encounter: Payer: Self-pay | Admitting: Pediatrics

## 2016-07-18 ENCOUNTER — Ambulatory Visit (INDEPENDENT_AMBULATORY_CARE_PROVIDER_SITE_OTHER): Payer: PPO

## 2016-07-18 VITALS — BP 127/67 | HR 84 | Temp 97.0°F | Resp 18 | Ht 64.0 in | Wt 184.4 lb

## 2016-07-18 DIAGNOSIS — J189 Pneumonia, unspecified organism: Secondary | ICD-10-CM

## 2016-07-18 DIAGNOSIS — Z23 Encounter for immunization: Secondary | ICD-10-CM

## 2016-07-18 DIAGNOSIS — L309 Dermatitis, unspecified: Secondary | ICD-10-CM | POA: Diagnosis not present

## 2016-07-18 DIAGNOSIS — H60543 Acute eczematoid otitis externa, bilateral: Secondary | ICD-10-CM

## 2016-07-18 DIAGNOSIS — I10 Essential (primary) hypertension: Secondary | ICD-10-CM | POA: Diagnosis not present

## 2016-07-18 MED ORDER — TRIAMCINOLONE ACETONIDE 0.1 % EX OINT: 1.0000 "application " | TOPICAL_OINTMENT | Freq: Two times a day (BID) | CUTANEOUS | 0 refills | Status: DC

## 2016-07-18 NOTE — Progress Notes (Signed)
  Subjective:   Patient ID: Autumn Moyer, female    DOB: [birth year], 76 y.o.   MRN: [id number] CC: Follow-up (4 week, pneumonia)  HPI: Autumn Moyer is a 76 y.o. female presenting for Follow-up (4 week, pneumonia)  Feeling better over all Appetite has been ok Walking regularly In the morning goes to [employer] for 20 min In the afternoon walking around the yard outside  No SOB, no chest pressure or pain Normal stress test 04/2016  No coughing, no fevers Has noticed more red "eczema" spots on her lower legs Has had eczema in ear canals that come and go, none there now elocon takes care of it in ear canals within a few days Using lotion alone on lower legs now Elocon from 2014 didn't help when she tried it once weeks ago Gets similar spots this time of year in the past Some itching  Relevant past medical, surgical, family and social history reviewed. Allergies and medications reviewed and updated. History  Smoking Status  . Never Smoker  Smokeless Tobacco  . Never Used   ROS: Per HPI   Objective:    BP 127/67   Pulse 84   Temp 97 F (36.1 C) (Oral)   Resp 18   Ht 5\' 4"  (1.626 m)   Wt 184 lb 6.4 oz (83.6 kg)   SpO2 94%   BMI 31.65 kg/m   Wt Readings from Last 3 Encounters:  07/18/16 184 lb 6.4 oz (83.6 kg)  06/29/16 188 lb 9.6 oz (85.5 kg)  06/13/16 187 lb 3.2 oz (84.9 kg)    Gen: NAD, alert, cooperative with exam, NCAT EYES: EOMI, no conjunctival injection, or no icterus ENT:  TMs obscured by cerumen b/l, OP without erythema, normal ext ears and ear canals LYMPH: no cervical LAD CV: NRRR, normal S1/S2, III/VI RUSB systolic ejection murmur, distal pulses 2+ b/l Resp: CTABL, no wheezes, normal WOB Abd: +BS, soft, NTND. no guarding or organomegaly Ext: No edema, warm Neuro: Alert and oriented Skin: lower legs with several scattered slightly erythematous patches apprx 1 cm, some excoriations present, minimal lichenification, no flaking/scale  Assessment & Plan:    Autumn Moyer was seen today for follow-up multiple med problems  Diagnoses and all orders for this visit:  Pneumonia due to infectious organism, unspecified laterality, unspecified part of lung Symptoms resolved CXR with no infiltrate, continues to have hiatal hernia Feeling back to normal self I personally have reviewed the images. -     DG Chest 2 View; Future  Eczema of external ear, bilateral Eczema, unspecified type Trial of below on legs b/l, cont thick moisturizing If not improving let me know -     triamcinolone ointment (KENALOG) 0.1 %; Apply 1 application topically 2 (two) times daily.  Essential hypertension, benign Well controlled, cont current meds  Need for vaccination against Streptococcus pneumoniae using pneumococcal conjugate vaccine 13 -     Pneumococcal conjugate vaccine 13-valent   Follow up plan: Return in about 5 months (around 12/18/2016). Assunta Found, MD [place]

## 2016-07-26 ENCOUNTER — Other Ambulatory Visit: Payer: Self-pay | Admitting: Family

## 2016-07-26 DIAGNOSIS — I10 Essential (primary) hypertension: Secondary | ICD-10-CM

## 2016-08-12 DIAGNOSIS — L821 Other seborrheic keratosis: Secondary | ICD-10-CM | POA: Diagnosis not present

## 2016-08-12 DIAGNOSIS — L570 Actinic keratosis: Secondary | ICD-10-CM | POA: Diagnosis not present

## 2016-08-12 DIAGNOSIS — D229 Melanocytic nevi, unspecified: Secondary | ICD-10-CM | POA: Diagnosis not present

## 2016-08-29 DIAGNOSIS — Z961 Presence of intraocular lens: Secondary | ICD-10-CM | POA: Diagnosis not present

## 2016-08-29 DIAGNOSIS — D3131 Benign neoplasm of right choroid: Secondary | ICD-10-CM | POA: Diagnosis not present

## 2016-09-05 ENCOUNTER — Other Ambulatory Visit: Payer: Self-pay | Admitting: Family Medicine

## 2016-09-05 ENCOUNTER — Other Ambulatory Visit: Payer: Self-pay | Admitting: Pediatrics

## 2016-09-05 DIAGNOSIS — Z1231 Encounter for screening mammogram for malignant neoplasm of breast: Secondary | ICD-10-CM

## 2016-10-04 ENCOUNTER — Telehealth: Payer: Self-pay | Admitting: Cardiology

## 2016-10-04 DIAGNOSIS — I350 Nonrheumatic aortic (valve) stenosis: Secondary | ICD-10-CM

## 2016-10-04 NOTE — Telephone Encounter (Signed)
New message    Pt is calling about scheduling an [place] of her aorta. She said she should have one yearly, there is no order.

## 2016-10-04 NOTE — Telephone Encounter (Signed)
Echo ordered and send to scheduler to be schedule 

## 2016-10-10 ENCOUNTER — Ambulatory Visit
Admission: RE | Admit: 2016-10-10 | Discharge: 2016-10-10 | Disposition: A | Discharge status: Home / Self Care | Payer: PPO | Source: Ambulatory Visit | Attending: Pediatrics | Admitting: Pediatrics

## 2016-10-10 DIAGNOSIS — Z1231 Encounter for screening mammogram for malignant neoplasm of breast: Secondary | ICD-10-CM

## 2016-10-19 ENCOUNTER — Other Ambulatory Visit: Payer: Self-pay

## 2016-10-19 ENCOUNTER — Ambulatory Visit (HOSPITAL_COMMUNITY): Payer: PPO | Attending: Cardiology

## 2016-10-19 DIAGNOSIS — I352 Nonrheumatic aortic (valve) stenosis with insufficiency: Secondary | ICD-10-CM | POA: Insufficient documentation

## 2016-10-19 DIAGNOSIS — I361 Nonrheumatic tricuspid (valve) insufficiency: Secondary | ICD-10-CM | POA: Insufficient documentation

## 2016-10-19 DIAGNOSIS — I119 Hypertensive heart disease without heart failure: Secondary | ICD-10-CM | POA: Diagnosis not present

## 2016-10-19 DIAGNOSIS — I350 Nonrheumatic aortic (valve) stenosis: Secondary | ICD-10-CM

## 2016-10-24 ENCOUNTER — Other Ambulatory Visit: Payer: Self-pay | Admitting: Pediatrics

## 2016-10-24 DIAGNOSIS — K219 Gastro-esophageal reflux disease without esophagitis: Secondary | ICD-10-CM

## 2016-11-15 ENCOUNTER — Other Ambulatory Visit: Payer: Self-pay | Admitting: Family

## 2016-11-23 ENCOUNTER — Other Ambulatory Visit: Payer: Self-pay | Admitting: Pediatrics

## 2016-11-23 DIAGNOSIS — K219 Gastro-esophageal reflux disease without esophagitis: Secondary | ICD-10-CM

## 2016-12-07 ENCOUNTER — Other Ambulatory Visit: Payer: Self-pay | Admitting: Family

## 2016-12-07 DIAGNOSIS — J309 Allergic rhinitis, unspecified: Secondary | ICD-10-CM

## 2016-12-26 ENCOUNTER — Encounter: Payer: Self-pay | Admitting: Pediatrics

## 2016-12-26 ENCOUNTER — Telehealth: Payer: Self-pay | Admitting: Pediatrics

## 2016-12-26 ENCOUNTER — Ambulatory Visit (INDEPENDENT_AMBULATORY_CARE_PROVIDER_SITE_OTHER): Payer: PPO | Admitting: Pediatrics

## 2016-12-26 VITALS — BP 129/71 | HR 77 | Temp 97.5°F | Ht 64.0 in | Wt 177.8 lb

## 2016-12-26 DIAGNOSIS — J309 Allergic rhinitis, unspecified: Secondary | ICD-10-CM | POA: Diagnosis not present

## 2016-12-26 DIAGNOSIS — N3000 Acute cystitis without hematuria: Secondary | ICD-10-CM

## 2016-12-26 DIAGNOSIS — E039 Hypothyroidism, unspecified: Secondary | ICD-10-CM

## 2016-12-26 DIAGNOSIS — Z23 Encounter for immunization: Secondary | ICD-10-CM

## 2016-12-26 DIAGNOSIS — I10 Essential (primary) hypertension: Secondary | ICD-10-CM | POA: Diagnosis not present

## 2016-12-26 DIAGNOSIS — K219 Gastro-esophageal reflux disease without esophagitis: Secondary | ICD-10-CM

## 2016-12-26 DIAGNOSIS — R350 Frequency of micturition: Secondary | ICD-10-CM | POA: Diagnosis not present

## 2016-12-26 LAB — URINALYSIS, COMPLETE
BILIRUBIN UA: NEGATIVE
GLUCOSE, UA: NEGATIVE
Ketones, UA: NEGATIVE
LEUKOCYTES UA: NEGATIVE
Nitrite, UA: NEGATIVE
PROTEIN UA: NEGATIVE
SPEC GRAV UA: 1.02 (ref 1.005–1.030)
Urobilinogen, Ur: 0.2 mg/dL (ref 0.2–1.0)
pH, UA: 6 (ref 5.0–7.5)

## 2016-12-26 LAB — MICROSCOPIC EXAMINATION: RENAL EPITHEL UA: NONE SEEN /HPF

## 2016-12-26 MED ORDER — PANTOPRAZOLE SODIUM 40 MG PO TBEC: 40.0000 mg | DELAYED_RELEASE_TABLET | Freq: Every day | ORAL | 1 refills | Status: DC

## 2016-12-26 MED ORDER — LEVOCETIRIZINE DIHYDROCHLORIDE 5 MG PO TABS: ORAL_TABLET | ORAL | 1 refills | Status: DC

## 2016-12-26 MED ORDER — LEVOTHYROXINE SODIUM 100 MCG PO TABS: ORAL_TABLET | ORAL | 1 refills | Status: DC

## 2016-12-26 MED ORDER — BENAZEPRIL-HYDROCHLOROTHIAZIDE 20-25 MG PO TABS: 1.0000 | ORAL_TABLET | Freq: Every day | ORAL | 1 refills | Status: DC

## 2016-12-26 NOTE — Progress Notes (Signed)
  Subjective:   Patient ID: Autumn Moyer, female    DOB: Jun 02, 1940, 76 y.o.   MRN: [id number] CC: Follow-up (5 month) multiple med problems HPI: Autumn Moyer is a 76 y.o. female presenting for Follow-up (5 month)  Walks 30 min on level ground about 5 days a week No SOB, doing well  Sometimes when bent over such as tying her shoes or mopping she will feel slightly SOB, improves once she is upright again  No HA, no CP Recent ECHO with mod AS, following with cardiology Taking meds regularly  Allergy symptoms controlled with xyzal, taking regularly  Hypothyroidism: taking med regularly  GERD: takes PPI daily, hasnt tried decreasing/weaning recently  Having more frequent urination, no fever,s normal appetite, feeling well otherwise Ongoing for a couple of weeks No dysuria  Relevant past medical, surgical, family and social history reviewed. Allergies and medications reviewed and updated. History  Smoking Status  . Never Smoker  Smokeless Tobacco  . Never Used   ROS: Per HPI   Objective:    BP 129/71   Pulse 77   Temp (!) 97.5 F (36.4 C) (Oral)   Ht 5\' 4"  (1.626 m)   Wt 177 lb 12.8 oz (80.6 kg)   BMI 30.52 kg/m   Wt Readings from Last 3 Encounters:  12/26/16 177 lb 12.8 oz (80.6 kg)  07/18/16 184 lb 6.4 oz (83.6 kg)  06/29/16 188 lb 9.6 oz (85.5 kg)    Gen: NAD, alert, cooperative with exam, NCAT EYES: EOMI, no conjunctival injection, or no icterus ENT:  OP without erythema LYMPH: no cervical LAD CV: NRRR, III/VI systolic ejection murmur at upper sternal borders, normal S1/S2, distal pulses 2+ b/l Resp: CTABL, no wheezes, normal WOB Abd: +BS, soft, NTND. no guarding or organomegaly Ext: No edema, warm Neuro: Alert and oriented, strength equal b/l UE and LE, coordination grossly normal  Assessment & Plan:  Autumn Moyer was seen today for follow-up med problems.  Diagnoses and all orders for this visit:  Hypothyroidism, unspecified type Stable, cont below -      levothyroxine (SYNTHROID, LEVOTHROID) 100 MCG tablet; TAKE 1 TABLET BY MOUTH ONCE DAILY IN THE MORNING ON AN EMPTY STOMACH -     TSH  Essential hypertension Adequate control, cont below -     benazepril-hydrochlorthiazide (LOTENSIN HCT) 20-25 MG tablet; Take 1 tablet by mouth daily. -     Basic Metabolic Panel  Allergic rhinitis, unspecified seasonality, unspecified trigger Stable, takes below regularly -     levocetirizine (XYZAL) 5 MG tablet; TAKE 1 TABELT IN THE EVENING  Gastroesophageal reflux disease, esophagitis presence not specified Discussed weaning, symptom control -     pantoprazole (PROTONIX) 40 MG tablet; Take 1 tablet (40 mg total) by mouth daily.  Urinary frequency -     Urinalysis, Complete -     Urine Culture  Need for immunization against influenza -     Flu Vaccine QUAD 36+ mos IM  Other orders -     Cancel: Lysine 500 MG TABS; Take by mouth. -     Microscopic Examination   Follow up plan: No Follow-up on file. Assunta Found, MD [place]

## 2016-12-27 LAB — BASIC METABOLIC PANEL
BUN / CREAT RATIO: 15 (ref 12–28)
BUN: 15 mg/dL (ref 8–27)
CO2: 23 mmol/L (ref 20–29)
Calcium: 9.8 mg/dL (ref 8.7–10.3)
Chloride: 103 mmol/L (ref 96–106)
Creatinine, Ser: 0.99 mg/dL (ref 0.57–1.00)
GFR calc non Af Amer: 56 mL/min/{1.73_m2} — ABNORMAL LOW (ref >59)
GFR, EST AFRICAN AMERICAN: 65 mL/min/{1.73_m2} (ref >59)
GLUCOSE: 105 mg/dL — AB (ref 65–99)
Potassium: 4 mmol/L (ref 3.5–5.2)
SODIUM: 143 mmol/L (ref 134–144)

## 2016-12-27 LAB — TSH: TSH: 0.22 u[IU]/mL — AB (ref 0.450–4.500)

## 2016-12-29 LAB — URINE CULTURE

## 2016-12-29 MED ORDER — NITROFURANTOIN MONOHYD MACRO 100 MG PO CAPS: 100.0000 mg | ORAL_CAPSULE | Freq: Two times a day (BID) | ORAL | 0 refills | Status: AC

## 2016-12-29 NOTE — Addendum Note (Signed)
Addended by: Eustaquio Maize on: 12/29/2016 04:06 PM   Modules accepted: Orders

## 2017-06-23 ENCOUNTER — Other Ambulatory Visit: Payer: Self-pay | Admitting: Pediatrics

## 2017-06-23 DIAGNOSIS — K219 Gastro-esophageal reflux disease without esophagitis: Secondary | ICD-10-CM

## 2017-07-04 ENCOUNTER — Other Ambulatory Visit: Payer: Self-pay | Admitting: Pediatrics

## 2017-07-04 DIAGNOSIS — J309 Allergic rhinitis, unspecified: Secondary | ICD-10-CM

## 2017-07-14 ENCOUNTER — Telehealth: Payer: Self-pay | Admitting: Pediatrics

## 2017-07-18 ENCOUNTER — Other Ambulatory Visit: Payer: Self-pay | Admitting: Pediatrics

## 2017-07-18 DIAGNOSIS — I10 Essential (primary) hypertension: Secondary | ICD-10-CM

## 2017-07-20 ENCOUNTER — Other Ambulatory Visit: Payer: Self-pay | Admitting: Pediatrics

## 2017-07-20 DIAGNOSIS — I10 Essential (primary) hypertension: Secondary | ICD-10-CM

## 2017-08-10 ENCOUNTER — Ambulatory Visit (INDEPENDENT_AMBULATORY_CARE_PROVIDER_SITE_OTHER): Payer: PPO | Admitting: Pediatrics

## 2017-08-10 ENCOUNTER — Encounter: Payer: Self-pay | Admitting: Pediatrics

## 2017-08-10 DIAGNOSIS — E039 Hypothyroidism, unspecified: Secondary | ICD-10-CM | POA: Diagnosis not present

## 2017-08-10 DIAGNOSIS — I10 Essential (primary) hypertension: Secondary | ICD-10-CM

## 2017-08-10 DIAGNOSIS — H60543 Acute eczematoid otitis externa, bilateral: Secondary | ICD-10-CM | POA: Diagnosis not present

## 2017-08-10 DIAGNOSIS — J309 Allergic rhinitis, unspecified: Secondary | ICD-10-CM

## 2017-08-10 MED ORDER — LEVOTHYROXINE SODIUM 100 MCG PO TABS: ORAL_TABLET | ORAL | 1 refills | Status: DC

## 2017-08-10 MED ORDER — TRIAMCINOLONE ACETONIDE 0.1 % EX OINT: 1.0000 "application " | TOPICAL_OINTMENT | Freq: Two times a day (BID) | CUTANEOUS | 0 refills | Status: DC

## 2017-08-10 MED ORDER — LEVOCETIRIZINE DIHYDROCHLORIDE 5 MG PO TABS: ORAL_TABLET | ORAL | 1 refills | Status: DC

## 2017-08-10 MED ORDER — BENAZEPRIL-HYDROCHLOROTHIAZIDE 20-25 MG PO TABS: 1.0000 | ORAL_TABLET | Freq: Every day | ORAL | 1 refills | Status: DC

## 2017-08-10 NOTE — Progress Notes (Signed)
  Subjective:   Patient ID: [name], female    DOB: 10/15/1940, 77 y.o.   MRN: [id number] CC: Medical Management of Chronic Issues (6 month)  HPI: KELSE PLOCH is a 77 y.o. female   Hypertension: Taking meds regularly.  No chest pain, headaches, shortness of breath.  Has been checking at home with systolics in 174B.  Hypothyroidism: Taking medicine regularly.  Allergies: Tend to get worse this time a year, has some symptoms are year-round.  Taking allergy medicine regularly.  Eczema: Has new slightly thickened place on the bottom of her right foot.  Sometimes itches, sometimes does not notice it.  Looks like what other eczema places have in the past.  Relevant past medical, surgical, family and social history reviewed. Allergies and medications reviewed and updated. Social History   Tobacco Use  Smoking Status Never Smoker  Smokeless Tobacco Never Used   ROS: Per HPI   Objective:    BP (!) 150/85   Pulse 71   Temp (!) 96.6 F (35.9 C) (Oral)   Ht 5\' 4"  (1.626 m)   Wt 177 lb 12.8 oz (80.6 kg)   BMI 30.52 kg/m   Wt Readings from Last 3 Encounters:  08/10/17 177 lb 12.8 oz (80.6 kg)  12/26/16 177 lb 12.8 oz (80.6 kg)  07/18/16 184 lb 6.4 oz (83.6 kg)    Gen: NAD, alert, cooperative with exam, NCAT EYES: EOMI, no conjunctival injection, or no icterus ENT:  TMs pearly gray b/l, OP without erythema LYMPH: no cervical LAD CV: NRRR, normal S1/S2, no murmur, distal pulses 2+ b/l Resp: CTABL, no wheezes, normal WOB Abd: +BS, soft, NTND. no guarding or organomegaly Ext: No edema, warm Neuro: Alert and oriented, strength equal b/l UE and LE, coordination grossly normal MSK: normal muscle bulk Skin: Approximately 1 cm patch slightly thickened skin just proximal to the ball of her right foot on the sole.  Assessment & Plan:  Honestee was seen today for medical management of chronic issues.  Diagnoses and all orders for this visit:  Essential hypertension Continue  current medicines.  Check blood pressures at home.  Follow-up in 3 weeks for nurse visit, bring blood pressure sheet. -     benazepril-hydrochlorthiazide (LOTENSIN HCT) 20-25 MG tablet; Take 1 tablet by mouth daily. -     Basic Metabolic Panel  Allergic rhinitis, unspecified seasonality, unspecified trigger Continue below, stable -     levocetirizine (XYZAL) 5 MG tablet; TAKE 1 TABLET BY MOUTH IN THE EVENING  Hypothyroidism, unspecified type Due for repeat TSH, continue levothyroxine -     levothyroxine (SYNTHROID, LEVOTHROID) 100 MCG tablet; TAKE 1 TABLET BY MOUTH ONCE DAILY IN THE MORNING ON AN EMPTY STOMACH -     TSH  Eczema of external ear, bilateral Continue below as needed on here.  Okay to use on place on foot.  If not improving over the next couple weeks, for any worsening return to clinic versus see dermatology. -     triamcinolone ointment (KENALOG) 0.1 %; Apply 1 application topically 2 (two) times daily.   Follow up plan: 3 week nurse visit for blood pressure follow-up.  6 months to see me. Assunta Found, MD [place]

## 2017-08-11 LAB — BASIC METABOLIC PANEL
BUN / CREAT RATIO: 16 (ref 12–28)
BUN: 14 mg/dL (ref 8–27)
CO2: 24 mmol/L (ref 20–29)
CREATININE: 0.85 mg/dL (ref 0.57–1.00)
Calcium: 10.1 mg/dL (ref 8.7–10.3)
Chloride: 103 mmol/L (ref 96–106)
GFR, EST AFRICAN AMERICAN: 78 mL/min/{1.73_m2} (ref >59)
GFR, EST NON AFRICAN AMERICAN: 68 mL/min/{1.73_m2} (ref >59)
Glucose: 105 mg/dL — ABNORMAL HIGH (ref 65–99)
POTASSIUM: 4.1 mmol/L (ref 3.5–5.2)
SODIUM: 140 mmol/L (ref 134–144)

## 2017-08-11 LAB — TSH: TSH: 0.819 u[IU]/mL (ref 0.450–4.500)

## 2017-09-01 ENCOUNTER — Ambulatory Visit: Payer: PPO | Admitting: *Deleted

## 2017-09-01 ENCOUNTER — Ambulatory Visit: Payer: PPO

## 2017-09-01 VITALS — BP 124/70 | HR 75

## 2017-09-01 DIAGNOSIS — Z0130 Encounter for examination of blood pressure without abnormal findings: Secondary | ICD-10-CM

## 2017-09-01 NOTE — Progress Notes (Signed)
Pt here for BP ck Pt's machine BP  158 85 P 78   Our machine BP  142 75 P 78    Rck  Pt's machine BP  129 81 P 73  Our machine BP 124 70 P 75

## 2017-10-05 ENCOUNTER — Other Ambulatory Visit: Payer: Self-pay | Admitting: Pediatrics

## 2017-10-05 DIAGNOSIS — Z1231 Encounter for screening mammogram for malignant neoplasm of breast: Secondary | ICD-10-CM

## 2017-10-23 ENCOUNTER — Other Ambulatory Visit: Payer: Self-pay | Admitting: Cardiology

## 2017-10-23 ENCOUNTER — Ambulatory Visit (HOSPITAL_COMMUNITY): Payer: PPO | Attending: Cardiology

## 2017-10-23 ENCOUNTER — Other Ambulatory Visit: Payer: Self-pay

## 2017-10-23 DIAGNOSIS — I10 Essential (primary) hypertension: Secondary | ICD-10-CM | POA: Insufficient documentation

## 2017-10-23 DIAGNOSIS — I350 Nonrheumatic aortic (valve) stenosis: Secondary | ICD-10-CM | POA: Diagnosis not present

## 2017-10-23 DIAGNOSIS — I352 Nonrheumatic aortic (valve) stenosis with insufficiency: Secondary | ICD-10-CM | POA: Diagnosis not present

## 2017-10-26 ENCOUNTER — Ambulatory Visit
Admission: RE | Admit: 2017-10-26 | Discharge: 2017-10-26 | Disposition: A | Discharge status: Home / Self Care | Payer: PPO | Source: Ambulatory Visit | Attending: Pediatrics | Admitting: Pediatrics

## 2017-10-26 DIAGNOSIS — Z1231 Encounter for screening mammogram for malignant neoplasm of breast: Secondary | ICD-10-CM

## 2017-11-21 DIAGNOSIS — J0100 Acute maxillary sinusitis, unspecified: Secondary | ICD-10-CM | POA: Diagnosis not present

## 2017-12-28 ENCOUNTER — Ambulatory Visit (INDEPENDENT_AMBULATORY_CARE_PROVIDER_SITE_OTHER): Payer: PPO | Admitting: Family

## 2017-12-28 ENCOUNTER — Ambulatory Visit: Payer: PPO

## 2017-12-28 ENCOUNTER — Encounter: Payer: Self-pay | Admitting: Family

## 2017-12-28 VITALS — BP 132/98 | HR 89 | Temp 101.1°F | Ht 64.0 in | Wt 179.0 lb

## 2017-12-28 DIAGNOSIS — R350 Frequency of micturition: Secondary | ICD-10-CM | POA: Diagnosis not present

## 2017-12-28 DIAGNOSIS — R509 Fever, unspecified: Secondary | ICD-10-CM | POA: Diagnosis not present

## 2017-12-28 DIAGNOSIS — N3001 Acute cystitis with hematuria: Secondary | ICD-10-CM | POA: Diagnosis not present

## 2017-12-28 DIAGNOSIS — R6889 Other general symptoms and signs: Secondary | ICD-10-CM

## 2017-12-28 LAB — URINALYSIS
BILIRUBIN UA: NEGATIVE
GLUCOSE, UA: NEGATIVE
Nitrite, UA: NEGATIVE
PH UA: 5.5 (ref 5.0–7.5)
SPEC GRAV UA: 1.02 (ref 1.005–1.030)
Urobilinogen, Ur: 0.2 mg/dL (ref 0.2–1.0)

## 2017-12-28 LAB — VERITOR FLU A/B WAIVED
INFLUENZA A: NEGATIVE
INFLUENZA B: NEGATIVE

## 2017-12-28 MED ORDER — CEFTRIAXONE SODIUM 1 G IJ SOLR
1.0000 g | Freq: Once | INTRAMUSCULAR | Status: AC
  Administered 2017-12-28: 1 g via INTRAMUSCULAR

## 2017-12-28 MED ORDER — CEPHALEXIN 500 MG PO CAPS: 500.0000 mg | ORAL_CAPSULE | Freq: Two times a day (BID) | ORAL | 0 refills | Status: DC

## 2017-12-28 NOTE — Patient Instructions (Signed)

## 2017-12-28 NOTE — Progress Notes (Signed)
   Subjective:    Patient ID: Autumn Moyer, female    DOB: 1940/08/24, 77 y.o.   MRN: [id number]  Chief Complaint  Patient presents with  . Generalized Body Aches  . Chills  . small amount of sore throat  . stomach pain with gas    Urinary Frequency   This is a new problem. The current episode started yesterday. The problem occurs every urination. The problem has been rapidly worsening. The quality of the pain is described as burning. The pain is at a severity of 2/10. The pain is mild. The maximum temperature recorded prior to her arrival was 101 - 101.9 F. Associated symptoms include frequency, hesitancy and urgency. Pertinent negatives include no flank pain, hematuria, nausea or vomiting. She has tried increased fluids for the symptoms. The treatment provided no relief.      Review of Systems  Gastrointestinal: Negative for nausea and vomiting.  Genitourinary: Positive for frequency, hesitancy and urgency. Negative for flank pain and hematuria.  All other systems reviewed and are negative.      Objective:   Physical Exam  Constitutional: She is oriented to person, place, and time. She appears well-developed and well-nourished. She has a sickly appearance. No distress.  HENT:  Head: Normocephalic and atraumatic.  Right Ear: External ear normal.  Left Ear: External ear normal.  Nose: Mucosal edema and rhinorrhea present.  Mouth/Throat: Oropharynx is clear and moist.  Eyes: Pupils are equal, round, and reactive to light.  Neck: Normal range of motion. Neck supple. No thyromegaly present.  Cardiovascular: Normal rate, regular rhythm, normal heart sounds and intact distal pulses.  No murmur heard. Pulmonary/Chest: Effort normal and breath sounds normal. No respiratory distress. She has no wheezes.  Abdominal: Soft. Bowel sounds are normal. She exhibits no distension. There is tenderness (mild lower).  Musculoskeletal: Normal range of motion. She exhibits no edema or tenderness.    Neurological: She is alert and oriented to person, place, and time. She has normal reflexes. No cranial nerve deficit.  Skin: Skin is warm and dry.  Psychiatric: She has a normal mood and affect. Her behavior is normal. Judgment and thought content normal.  Vitals reviewed.     BP (!) 132/98   Pulse 89   Temp (!) 101.1 F (38.4 C) (Oral)   Ht 5\' 4"  (1.626 m)   Wt 179 lb (81.2 kg)   BMI 30.73 kg/m      Assessment & Plan:  Autumn Moyer comes in today with chief complaint of Generalized Body Aches; Chills; small amount of sore throat; and stomach pain with gas   Diagnosis and orders addressed:  1. Flu-like symptoms - Veritor Flu A/B Waived - Urinalysis  2. Urinary frequency - Urine Culture - Urinalysis  3. Fever, unspecified fever cause - Urine Culture  4. Acute cystitis with hematuria Force fluids AZO over the counter X2 days RTO 1 week Culture pending - cephALEXin (KEFLEX) 500 MG capsule; Take 1 capsule (500 mg total) by mouth 2 (two) times daily.  Dispense: 14 capsule; Refill: 0 - cefTRIAXone (ROCEPHIN) injection 1 g   Autumn Dun, FNP

## 2017-12-29 ENCOUNTER — Other Ambulatory Visit: Payer: Self-pay | Admitting: Pediatrics

## 2017-12-29 DIAGNOSIS — K219 Gastro-esophageal reflux disease without esophagitis: Secondary | ICD-10-CM

## 2017-12-29 NOTE — Telephone Encounter (Signed)
OV 01/08/18 

## 2017-12-31 LAB — URINE CULTURE

## 2018-01-08 ENCOUNTER — Ambulatory Visit: Payer: PPO

## 2018-01-08 ENCOUNTER — Ambulatory Visit: Payer: PPO | Admitting: Family

## 2018-01-08 NOTE — Progress Notes (Signed)
Erroneous encounter Pt on antibiotics Will return for flu vaccine

## 2018-01-10 DIAGNOSIS — Z79890 Hormone replacement therapy (postmenopausal): Secondary | ICD-10-CM | POA: Diagnosis not present

## 2018-01-10 DIAGNOSIS — X501XXA Overexertion from prolonged static or awkward postures, initial encounter: Secondary | ICD-10-CM | POA: Diagnosis not present

## 2018-01-10 DIAGNOSIS — K219 Gastro-esophageal reflux disease without esophagitis: Secondary | ICD-10-CM | POA: Diagnosis not present

## 2018-01-10 DIAGNOSIS — I10 Essential (primary) hypertension: Secondary | ICD-10-CM | POA: Diagnosis not present

## 2018-01-10 DIAGNOSIS — E079 Disorder of thyroid, unspecified: Secondary | ICD-10-CM | POA: Diagnosis not present

## 2018-01-10 DIAGNOSIS — S8392XA Sprain of unspecified site of left knee, initial encounter: Secondary | ICD-10-CM | POA: Diagnosis not present

## 2018-01-10 DIAGNOSIS — M25562 Pain in left knee: Secondary | ICD-10-CM | POA: Diagnosis not present

## 2018-01-15 ENCOUNTER — Ambulatory Visit (INDEPENDENT_AMBULATORY_CARE_PROVIDER_SITE_OTHER): Payer: PPO

## 2018-01-15 DIAGNOSIS — Z23 Encounter for immunization: Secondary | ICD-10-CM

## 2018-01-16 DIAGNOSIS — L821 Other seborrheic keratosis: Secondary | ICD-10-CM | POA: Diagnosis not present

## 2018-01-16 DIAGNOSIS — D485 Neoplasm of uncertain behavior of skin: Secondary | ICD-10-CM | POA: Diagnosis not present

## 2018-01-16 DIAGNOSIS — D0471 Carcinoma in situ of skin of right lower limb, including hip: Secondary | ICD-10-CM | POA: Diagnosis not present

## 2018-01-16 DIAGNOSIS — L570 Actinic keratosis: Secondary | ICD-10-CM | POA: Diagnosis not present

## 2018-02-07 ENCOUNTER — Encounter: Payer: Self-pay | Admitting: Pediatrics

## 2018-02-07 ENCOUNTER — Ambulatory Visit (INDEPENDENT_AMBULATORY_CARE_PROVIDER_SITE_OTHER): Payer: PPO | Admitting: Pediatrics

## 2018-02-07 ENCOUNTER — Ambulatory Visit (INDEPENDENT_AMBULATORY_CARE_PROVIDER_SITE_OTHER): Payer: PPO

## 2018-02-07 VITALS — BP 131/83 | HR 78 | Temp 97.3°F | Resp 20 | Ht 64.0 in | Wt 179.2 lb

## 2018-02-07 DIAGNOSIS — M79605 Pain in left leg: Secondary | ICD-10-CM

## 2018-02-07 DIAGNOSIS — K219 Gastro-esophageal reflux disease without esophagitis: Secondary | ICD-10-CM | POA: Diagnosis not present

## 2018-02-07 DIAGNOSIS — R7303 Prediabetes: Secondary | ICD-10-CM

## 2018-02-07 DIAGNOSIS — M1712 Unilateral primary osteoarthritis, left knee: Secondary | ICD-10-CM | POA: Diagnosis not present

## 2018-02-07 DIAGNOSIS — E039 Hypothyroidism, unspecified: Secondary | ICD-10-CM | POA: Diagnosis not present

## 2018-02-07 DIAGNOSIS — I10 Essential (primary) hypertension: Secondary | ICD-10-CM

## 2018-02-07 DIAGNOSIS — J309 Allergic rhinitis, unspecified: Secondary | ICD-10-CM | POA: Diagnosis not present

## 2018-02-07 LAB — BAYER DCA HB A1C WAIVED: HB A1C: 6 % (ref <7.0)

## 2018-02-07 MED ORDER — BENAZEPRIL-HYDROCHLOROTHIAZIDE 20-25 MG PO TABS: 1.0000 | ORAL_TABLET | Freq: Every day | ORAL | 1 refills | Status: DC

## 2018-02-07 MED ORDER — PANTOPRAZOLE SODIUM 40 MG PO TBEC: 40.0000 mg | DELAYED_RELEASE_TABLET | Freq: Every day | ORAL | 1 refills | Status: DC

## 2018-02-07 MED ORDER — LEVOCETIRIZINE DIHYDROCHLORIDE 5 MG PO TABS: ORAL_TABLET | ORAL | 1 refills | Status: DC

## 2018-02-07 MED ORDER — LEVOTHYROXINE SODIUM 100 MCG PO TABS: ORAL_TABLET | ORAL | 1 refills | Status: DC

## 2018-02-07 NOTE — Progress Notes (Signed)
  Subjective:   Patient ID: Autumn Moyer, female    DOB: November 19, 1940, 77 y.o.   MRN: [id number] CC: Medical Management of Chronic Issues  HPI: Autumn Moyer is a 77 y.o. female   Left lower leg pain: Feels like an aching throbbing.  Bothers her at night.  She has arthritis in her left knee.  This pain seems to be lower than that.  No injury that she knows of.  Hypothyroidism: Taking medicine regularly  GERD: Taking PPI  Hypertension: Taking medicine daily.  No lightheadedness, chest pain with exertion.  Sometimes feels slightly short of breath.  Relevant past medical, surgical, family and social history reviewed. Allergies and medications reviewed and updated. Social History   Tobacco Use  Smoking Status Never Smoker  Smokeless Tobacco Never Used   ROS: Per HPI   Objective:    BP 131/83   Pulse 78   Temp (!) 97.3 F (36.3 C) (Oral)   Resp 20   Ht '5\' 4"'$  (1.626 m)   Wt 179 lb 3.2 oz (81.3 kg)   SpO2 94%   BMI 30.76 kg/m   Wt Readings from Last 3 Encounters:  02/07/18 179 lb 3.2 oz (81.3 kg)  12/28/17 179 lb (81.2 kg)  08/10/17 177 lb 12.8 oz (80.6 kg)   Oxygen level stayed 94 to 95% after 1+ minute of brisk walking  Gen: NAD, alert, cooperative with exam, NCAT EYES: EOMI, no conjunctival injection, or no icterus ENT:  TMs pearly gray b/l, OP without erythema LYMPH: no cervical LAD CV: NRRR, normal S1/S2, no murmur, distal pulses 2+ b/l Resp: CTABL, no wheezes, normal WOB Abd: +BS, soft, NTND.  Ext: No edema, warm Neuro: Alert and oriented MSK: No point tenderness over left lower leg. + Crepitus present left knee Skin: No skin changes  Assessment & Plan:  Autumn Moyer was seen today for medical management of chronic issues.  Diagnoses and all orders for this visit:  Essential hypertension Adequate control, continue below -     benazepril-hydrochlorthiazide (LOTENSIN HCT) 20-25 MG tablet; Take 1 tablet by mouth daily. -     BMP8+EGFR  Gastroesophageal reflux  disease, esophagitis presence not specified Stable, continue below -     pantoprazole (PROTONIX) 40 MG tablet; Take 1 tablet (40 mg total) by mouth daily.  Hypothyroidism, unspecified type Stable, continue below -     levothyroxine (SYNTHROID, LEVOTHROID) 100 MCG tablet; TAKE 1 TABLET BY MOUTH ONCE DAILY IN THE MORNING ON AN EMPTY STOMACH -     TSH  Allergic rhinitis, unspecified seasonality, unspecified trigger Stable, continue below -     levocetirizine (XYZAL) 5 MG tablet; TAKE 1 TABLET BY MOUTH IN THE EVENING  Pain of left lower extremity Will get x-ray to confirm no bony changes.  If worsening, not improving, let me know. -     DG Tibia/Fibula Left; Future  Pre-diabetes -     Bayer DCA Hb A1c Waived   Follow up plan: Return in about 6 months (around 08/08/2018). Assunta Found, MD [place]

## 2018-02-08 LAB — BMP8+EGFR
BUN/Creatinine Ratio: 15 (ref 12–28)
BUN: 14 mg/dL (ref 8–27)
CALCIUM: 10.1 mg/dL (ref 8.7–10.3)
CHLORIDE: 100 mmol/L (ref 96–106)
CO2: 26 mmol/L (ref 20–29)
Creatinine, Ser: 0.94 mg/dL (ref 0.57–1.00)
GFR calc Af Amer: 69 mL/min/{1.73_m2} (ref >59)
GFR calc non Af Amer: 60 mL/min/{1.73_m2} (ref >59)
GLUCOSE: 95 mg/dL (ref 65–99)
Potassium: 4 mmol/L (ref 3.5–5.2)
Sodium: 141 mmol/L (ref 134–144)

## 2018-02-08 LAB — TSH: TSH: 1.05 u[IU]/mL (ref 0.450–4.500)

## 2018-03-06 NOTE — Progress Notes (Signed)
Cardiology Office Note   Date:  03/07/2018   ID:  Autumn Moyer, DOB 04/26/1940, MRN [id number]  PCP:  Eustaquio Maize, MD  Cardiologist:   Minus Breeding, MD   No chief complaint on file.     History of Present Illness: Autumn Moyer is a 77 y.o. female who presents for evaluation of aortic stenosis. Her AS was moderate in August.  No change in therapy.  Last year she had SOB but there was no clear etiology.  She had a negative perfusion study.    Since I last saw her she continues to have some dyspnea with exertion but this is unchanged.  It is intermittent.  She might be able to climb stairs without being short of breath for conversely might get dyspneic.  Is not reproducible.  She is not having any resting shortness of breath, PND or orthopnea.  Is not having any chest pressure, neck or arm discomfort.  She said no palpitations, presyncope or syncope.  Past Medical History:  Diagnosis Date  . Hypertension   . Thyroid disease     Past Surgical History:  Procedure Laterality Date  . EYE SURGERY    . TOE SURGERY    . TUBAL LIGATION       Current Outpatient Medications  Medication Sig Dispense Refill  . benazepril-hydrochlorthiazide (LOTENSIN HCT) 20-25 MG tablet Take 1 tablet by mouth daily. 90 tablet 1  . levocetirizine (XYZAL) 5 MG tablet TAKE 1 TABLET BY MOUTH IN THE EVENING 90 tablet 1  . levothyroxine (SYNTHROID, LEVOTHROID) 100 MCG tablet TAKE 1 TABLET BY MOUTH ONCE DAILY IN THE MORNING ON AN EMPTY STOMACH 90 tablet 1  . Lysine 500 MG TABS Take by mouth.    . mometasone (ELOCON) 0.1 % cream Apply 1.02 application topically daily as needed.    . Multiple Vitamin (MULTIVITAMIN) capsule Take 1 capsule by mouth daily.    . pantoprazole (PROTONIX) 40 MG tablet Take 1 tablet (40 mg total) by mouth daily. 90 tablet 1  . triamcinolone ointment (KENALOG) 0.1 % Apply 1 application topically 2 (two) times daily. 80 g 0   No current facility-administered medications for  this visit.     Allergies:   Noroxin [norfloxacin]    ROS:  Please see the history of present illness.   Otherwise, review of systems are positive for none .   All other systems are reviewed and negative.    PHYSICAL EXAM: VS:  BP (!) 178/76   Pulse 80   Ht 5\' 4"  (1.626 m)   Wt 179 lb (81.2 kg)   BMI 30.73 kg/m  , BMI Body mass index is 30.73 kg/m. GENERAL:  Well appearing NECK:  No jugular venous distention, waveform within normal limits, carotid upstroke brisk and symmetric, no bruits, no thyromegaly LUNGS:  Clear to auscultation bilaterally CHEST:  Unremarkable HEART:  PMI not displaced or sustained,S1 and S2 within normal limits, no S3, no S4, no clicks, no rubs, 3 of 6 apical systolic murmur radiating slightly at the aortic outflow tract and mid peaking, no diastolic murmurs ABD:  Flat, positive bowel sounds normal in frequency in pitch, no bruits, no rebound, no guarding, no midline pulsatile mass, no hepatomegaly, no splenomegaly EXT:  2 plus pulses throughout, no edema, no cyanosis no clubbing   EKG:  EKG is not ordered today.   Recent Labs: 02/07/2018: BUN 14; Creatinine, Ser 0.94; Potassium 4.0; Sodium 141; TSH 1.050    Lipid Panel No results  found for: CHOL, TRIG, HDL, CHOLHDL, VLDL, LDLCALC, LDLDIRECT    Wt Readings from Last 3 Encounters:  03/07/18 179 lb (81.2 kg)  02/07/18 179 lb 3.2 oz (81.3 kg)  12/28/17 179 lb (81.2 kg)      Other studies Reviewed: Additional studies/ records that were reviewed today include: Echo Review of the above records demonstrates:   See elsewhere   ASSESSMENT AND PLAN:  AS:    This is moderate.  I do not think any symptoms are related to this.  BNP was normal.  She had a negative ischemia work-up.  At this point I do not see a cardiac etiology for shortness of breath I think we can follow the left ear clinically and she and I had this discussion.  I will repeat an echo next year.  HTN:   The blood pressure is at target.  No change in medications is indicated. We will continue with therapeutic lifestyle changes (TLC).  SOB: As above.  I do not think there is a cardiac etiology to this.  I did suggest follow-up with her primary provider but she will let me know if this worsens between now and her next appointment.   Current medicines are reviewed at length with the patient today.  The patient does not have concerns regarding medicines.  The following changes have been made:    Orders Placed This Encounter  Procedures  . BNP Lexiscan Myoview.     Disposition:   See me in one year.   Signed, Minus Breeding, MD  03/07/2018 1:29 PM    [facility]

## 2018-03-07 ENCOUNTER — Ambulatory Visit (INDEPENDENT_AMBULATORY_CARE_PROVIDER_SITE_OTHER): Payer: PPO | Admitting: Cardiology

## 2018-03-07 ENCOUNTER — Encounter: Payer: Self-pay | Admitting: Cardiology

## 2018-03-07 VITALS — BP 178/76 | HR 80 | Ht 64.0 in | Wt 179.0 lb

## 2018-03-07 DIAGNOSIS — I10 Essential (primary) hypertension: Secondary | ICD-10-CM | POA: Diagnosis not present

## 2018-03-07 DIAGNOSIS — I350 Nonrheumatic aortic (valve) stenosis: Secondary | ICD-10-CM | POA: Diagnosis not present

## 2018-03-07 NOTE — Patient Instructions (Signed)
Medication Instructions:  The current medical regimen is effective;  continue present plan and medications.  If you need a refill on your cardiac medications before your next appointment, please call your pharmacy.   Testing/Procedures: Your physician has requested that you have an echocardiogram in August of 2020. Echocardiography is a painless test that uses sound waves to create images of your heart. It provides your doctor with information about the size and shape of your heart and how well your heart's chambers and valves are working. This procedure takes approximately one hour. There are no restrictions for this procedure.  Follow-Up: Follow up in 1 year with Dr. Percival Spanish in [place].  You will receive a letter in the mail 2 months before you are due.  Please call us when you receive this letter to schedule your follow up appointment.  Thank you for choosing [facility]!!

## 2018-07-25 ENCOUNTER — Encounter: Payer: Self-pay | Admitting: Family

## 2018-07-25 ENCOUNTER — Ambulatory Visit (INDEPENDENT_AMBULATORY_CARE_PROVIDER_SITE_OTHER): Payer: PPO | Admitting: Family

## 2018-07-25 ENCOUNTER — Other Ambulatory Visit: Payer: Self-pay

## 2018-07-25 VITALS — BP 152/87 | HR 92 | Temp 97.0°F | Ht 64.0 in | Wt 179.0 lb

## 2018-07-25 DIAGNOSIS — K2100 Gastro-esophageal reflux disease with esophagitis, without bleeding: Secondary | ICD-10-CM

## 2018-07-25 DIAGNOSIS — D509 Iron deficiency anemia, unspecified: Secondary | ICD-10-CM

## 2018-07-25 DIAGNOSIS — I10 Essential (primary) hypertension: Secondary | ICD-10-CM | POA: Diagnosis not present

## 2018-07-25 DIAGNOSIS — M5442 Lumbago with sciatica, left side: Secondary | ICD-10-CM

## 2018-07-25 DIAGNOSIS — E669 Obesity, unspecified: Secondary | ICD-10-CM

## 2018-07-25 DIAGNOSIS — E039 Hypothyroidism, unspecified: Secondary | ICD-10-CM

## 2018-07-25 MED ORDER — KETOROLAC TROMETHAMINE 60 MG/2ML IM SOLN
60.0000 mg | Freq: Once | INTRAMUSCULAR | Status: AC
  Administered 2018-07-25: 15:00:00 60 mg via INTRAMUSCULAR

## 2018-07-25 MED ORDER — DICLOFENAC SODIUM 75 MG PO TBEC: 75.0000 mg | DELAYED_RELEASE_TABLET | Freq: Two times a day (BID) | ORAL | 0 refills | Status: DC

## 2018-07-25 MED ORDER — METHYLPREDNISOLONE ACETATE 80 MG/ML IJ SUSP
80.0000 mg | Freq: Once | INTRAMUSCULAR | Status: AC
  Administered 2018-07-25: 80 mg via INTRAMUSCULAR

## 2018-07-25 NOTE — Progress Notes (Signed)
Subjective:    Patient ID: Autumn Moyer, female    DOB: Mar 12, 1941, 78 y.o.   MRN: [id number]  Chief Complaint  Patient presents with  . left hip and leg pain    had for five weeks    Back Pain  This is a recurrent problem. The current episode started more than 1 month ago. The problem occurs intermittently. The pain is present in the gluteal. The quality of the pain is described as aching. The pain radiates to the left thigh and left knee. The pain is at a severity of 6/10. The pain is moderate. The symptoms are aggravated by bending, standing and twisting. Associated symptoms include leg pain, numbness and tingling. Pertinent negatives include no bladder incontinence or bowel incontinence. Risk factors include obesity and menopause. She has tried walking, NSAIDs and bed rest for the symptoms. The treatment provided mild relief.  Gastroesophageal Reflux  She complains of belching and heartburn. She reports no coughing. This is a chronic problem. The current episode started more than 1 year ago. The problem occurs occasionally. Associated symptoms include fatigue. Risk factors include obesity. She has tried a PPI for the symptoms. The treatment provided moderate relief.  Thyroid Problem  Presents for follow-up visit. Symptoms include fatigue. Patient reports no constipation or diarrhea. The symptoms have been stable.  Anemia  Presents for follow-up visit. Symptoms include malaise/fatigue. There has been no bruising/bleeding easily or light-headedness.  Hypertension  This is a chronic problem. The current episode started more than 1 year ago. The problem has been waxing and waning since onset. The problem is uncontrolled. Associated symptoms include malaise/fatigue. Pertinent negatives include no peripheral edema or shortness of breath. Risk factors for coronary artery disease include dyslipidemia, obesity and sedentary lifestyle. Identifiable causes of hypertension include a thyroid problem.      Review of Systems  Constitutional: Positive for fatigue and malaise/fatigue.  Respiratory: Negative for cough and shortness of breath.   Gastrointestinal: Positive for heartburn. Negative for bowel incontinence, constipation and diarrhea.  Genitourinary: Negative for bladder incontinence.  Musculoskeletal: Positive for back pain.  Neurological: Positive for tingling and numbness. Negative for light-headedness.  Hematological: Does not bruise/bleed easily.  All other systems reviewed and are negative.      Objective:   Physical Exam Vitals signs reviewed.  Constitutional:      General: She is not in acute distress.    Appearance: She is well-developed.  Eyes:     Pupils: Pupils are equal, round, and reactive to light.  Neck:     Musculoskeletal: Normal range of motion and neck supple.     Thyroid: No thyromegaly.  Cardiovascular:     Rate and Rhythm: Normal rate and regular rhythm.     Heart sounds: Murmur present.  Pulmonary:     Effort: Pulmonary effort is normal. No respiratory distress.     Breath sounds: Normal breath sounds. No wheezing.  Abdominal:     General: Bowel sounds are normal. There is no distension.     Palpations: Abdomen is soft.     Tenderness: There is no abdominal tenderness.  Musculoskeletal:        General: No tenderness.     Comments: Lower back pain with flexion or extension  Skin:    General: Skin is warm and dry.  Neurological:     Mental Status: She is alert and oriented to person, place, and time.     Cranial Nerves: No cranial nerve deficit.  Deep Tendon Reflexes: Reflexes are normal and symmetric.  Psychiatric:        Behavior: Behavior normal.        Thought Content: Thought content normal.        Judgment: Judgment normal.      BP (!) 152/87   Pulse 92   Temp (!) 97 F (36.1 C) (Oral)   Ht _0  (1.626 m)   Wt 179 lb (81.2 kg)   BMI 30.73 kg/m       Assessment & Plan:  TERRILEE DUDZIK comes in today with chief  complaint of left hip and leg pain (had for five weeks)   Diagnosis and orders addressed:  1. Essential hypertension, benign - CMP14+EGFR - CBC with Differential/Platelet  2. Obesity (BMI 30.0-34.9) - CMP14+EGFR - CBC with Differential/Platelet  3. Hypothyroidism, unspecified type - CMP14+EGFR - CBC with Differential/Platelet - TSH  4. Iron deficiency anemia, unspecified iron deficiency anemia type - CMP14+EGFR - CBC with Differential/Platelet  5. Acute bilateral low back pain with left-sided sciatica Rest Ice ROM exercises encouarged - CMP14+EGFR - CBC with Differential/Platelet - methylPREDNISolone acetate (DEPO-MEDROL) injection 80 mg - ketorolac (TORADOL) injection 60 mg - diclofenac (VOLTAREN) 75 MG EC tablet; Take 1 tablet (75 mg total) by mouth 2 (two) times daily.  Dispense: 30 tablet; Refill: 0  6. Gastroesophageal reflux disease with esophagitis - CMP14+EGFR - CBC with Differential/Platelet   Labs pending Health Maintenance reviewed Diet and exercise encouraged  Follow up plan: 6 months    Evelina Dun, FNP

## 2018-07-25 NOTE — Patient Instructions (Signed)
Sciatica Rehab  Ask your health care provider which exercises are safe for you. Do exercises exactly as told by your health care provider and adjust them as directed. It is normal to feel mild stretching, pulling, tightness, or discomfort as you do these exercises, but you should stop right away if you feel sudden pain or your pain gets worse.Do not begin these exercises until told by your health care provider.  Stretching and range of motion exercises  These exercises warm up your muscles and joints and improve the movement and flexibility of your hips and your back. These exercises also help to relieve pain, numbness, and tingling.  Exercise A: Sciatic nerve glide  1. Sit in a chair with your head facing down toward your chest. Place your hands behind your back. Let your shoulders slump forward.  2. Slowly straighten one of your knees while you tilt your head back as if you are looking toward the ceiling. Only straighten your leg as far as you can without making your symptoms worse.  3. Hold for __________ seconds.  4. Slowly return to the starting position.  5. Repeat with your other leg.  Repeat __________ times. Complete this exercise __________ times a day.  Exercise B: Knee to chest with hip adduction and internal rotation    1. Lie on your back on a firm surface with both legs straight.  2. Bend one of your knees and move it up toward your chest until you feel a gentle stretch in your lower back and buttock. Then, move your knee toward the shoulder that is on the opposite side from your leg.  ? Hold your leg in this position by holding onto the front of your knee.  3. Hold for __________ seconds.  4. Slowly return to the starting position.  5. Repeat with your other leg.  Repeat __________ times. Complete this exercise __________ times a day.  Exercise C: Prone extension on elbows    1. Lie on your abdomen on a firm surface. A bed may be too soft for this exercise.  2. Prop yourself up on your  elbows.  3. Use your arms to help lift your chest up until you feel a gentle stretch in your abdomen and your lower back.  ? This will place some of your body weight on your elbows. If this is uncomfortable, try stacking pillows under your chest.  ? Your hips should stay down, against the surface that you are lying on. Keep your hip and back muscles relaxed.  4. Hold for __________ seconds.  5. Slowly relax your upper body and return to the starting position.  Repeat __________ times. Complete this exercise __________ times a day.  Strengthening exercises  These exercises build strength and endurance in your back. Endurance is the ability to use your muscles for a long time, even after they get tired.  Exercise D: Pelvic tilt  1. Lie on your back on a firm surface. Bend your knees and keep your feet flat.  2. Tense your abdominal muscles. Tip your pelvis up toward the ceiling and flatten your lower back into the floor.  ? To help with this exercise, you may place a small towel under your lower back and try to push your back into the towel.  3. Hold for __________ seconds.  4. Let your muscles relax completely before you repeat this exercise.  Repeat __________ times. Complete this exercise __________ times a day.  Exercise E: Alternating arm and leg raises      1. Get on your hands and knees on a firm surface. If you are on a hard floor, you may want to use padding to cushion your knees, such as an exercise mat.  2. Line up your arms and legs. Your hands should be below your shoulders, and your knees should be below your hips.  3. Lift your left leg behind you. At the same time, raise your right arm and straighten it in front of you.  ? Do not lift your leg higher than your hip.  ? Do not lift your arm higher than your shoulder.  ? Keep your abdominal and back muscles tight.  ? Keep your hips facing the ground.  ? Do not arch your back.  ? Keep your balance carefully, and do not hold your breath.  4. Hold for  __________ seconds.  5. Slowly return to the starting position and repeat with your right leg and your left arm.  Repeat __________ times. Complete this exercise __________ times a day.  Posture and body mechanics    Body mechanics refers to the movements and positions of your body while you do your daily activities. Posture is part of body mechanics. Good posture and healthy body mechanics can help to relieve stress in your body's tissues and joints. Good posture means that your spine is in its natural S-curve position (your spine is neutral), your shoulders are pulled back slightly, and your head is not tipped forward. The following are general guidelines for applying improved posture and body mechanics to your everyday activities.  Standing     When standing, keep your spine neutral and your feet about hip-width apart. Keep a slight bend in your knees. Your ears, shoulders, and hips should line up.   When you do a task in which you stand in one place for a long time, place one foot up on a stable object that is 2-4 inches (5-10 cm) high, such as a footstool. This helps keep your spine neutral.  Sitting     When sitting, keep your spine neutral and keep your feet flat on the floor. Use a footrest, if necessary, and keep your thighs parallel to the floor. Avoid rounding your shoulders, and avoid tilting your head forward.   When working at a desk or a computer, keep your desk at a height where your hands are slightly lower than your elbows. Slide your chair under your desk so you are close enough to maintain good posture.   When working at a computer, place your monitor at a height where you are looking straight ahead and you do not have to tilt your head forward or downward to look at the screen.  Resting     When lying down and resting, avoid positions that are most painful for you.   If you have pain with activities such as sitting, bending, stooping, or squatting (flexion-based activities), lie in a  position in which your body does not bend very much. For example, avoid curling up on your side with your arms and knees near your chest (fetal position).   If you have pain with activities such as standing for a long time or reaching with your arms (extension-based activities), lie with your spine in a neutral position and bend your knees slightly. Try the following positions:  ? Lying on your side with a pillow between your knees.  ? Lying on your back with a pillow under your knees.  Lifting     When lifting   objects, keep your feet at least shoulder-width apart and tighten your abdominal muscles.   Bend your knees and hips and keep your spine neutral. It is important to lift using the strength of your legs, not your back. Do not lock your knees straight out.   Always ask for help to lift heavy or awkward objects.  This information is not intended to replace advice given to you by your health care provider. Make sure you discuss any questions you have with your health care provider.  Document Released: 03/07/2005 Document Revised: 11/12/2015 Document Reviewed: 11/21/2014  Elsevier Interactive Patient Education  2019 [employer]

## 2018-07-26 ENCOUNTER — Other Ambulatory Visit: Payer: Self-pay | Admitting: Family

## 2018-07-26 LAB — CBC WITH DIFFERENTIAL/PLATELET
Basophils Absolute: 0.1 10*3/uL (ref 0.0–0.2)
Basos: 1 %
EOS (ABSOLUTE): 0.3 10*3/uL (ref 0.0–0.4)
Eos: 4 %
Hematocrit: 40.6 % (ref 34.0–46.6)
Hemoglobin: 14 g/dL (ref 11.1–15.9)
Immature Grans (Abs): 0.1 10*3/uL (ref 0.0–0.1)
Immature Granulocytes: 1 %
Lymphocytes Absolute: 3.2 10*3/uL — ABNORMAL HIGH (ref 0.7–3.1)
Lymphs: 37 %
MCH: 30.8 pg (ref 26.6–33.0)
MCHC: 34.5 g/dL (ref 31.5–35.7)
MCV: 89 fL (ref 79–97)
Monocytes Absolute: 0.6 10*3/uL (ref 0.1–0.9)
Monocytes: 7 %
Neutrophils Absolute: 4.3 10*3/uL (ref 1.4–7.0)
Neutrophils: 50 %
Platelets: 343 10*3/uL (ref 150–450)
RBC: 4.55 x10E6/uL (ref 3.77–5.28)
RDW: 12.9 % (ref 11.7–15.4)
WBC: 8.5 10*3/uL (ref 3.4–10.8)

## 2018-07-26 LAB — CMP14+EGFR
ALT: 21 IU/L (ref 0–32)
AST: 20 IU/L (ref 0–40)
Albumin/Globulin Ratio: 2 (ref 1.2–2.2)
Albumin: 4.8 g/dL — ABNORMAL HIGH (ref 3.7–4.7)
Alkaline Phosphatase: 53 IU/L (ref 39–117)
BUN/Creatinine Ratio: 23 (ref 12–28)
BUN: 22 mg/dL (ref 8–27)
Bilirubin Total: 0.7 mg/dL (ref 0.0–1.2)
CO2: 23 mmol/L (ref 20–29)
Calcium: 10 mg/dL (ref 8.7–10.3)
Chloride: 102 mmol/L (ref 96–106)
Creatinine, Ser: 0.95 mg/dL (ref 0.57–1.00)
GFR calc Af Amer: 68 mL/min/{1.73_m2} (ref >59)
GFR calc non Af Amer: 59 mL/min/{1.73_m2} — ABNORMAL LOW (ref >59)
Globulin, Total: 2.4 g/dL (ref 1.5–4.5)
Glucose: 105 mg/dL — ABNORMAL HIGH (ref 65–99)
Potassium: 3.8 mmol/L (ref 3.5–5.2)
Sodium: 142 mmol/L (ref 134–144)
Total Protein: 7.2 g/dL (ref 6.0–8.5)

## 2018-07-26 LAB — TSH: TSH: 0.447 u[IU]/mL — ABNORMAL LOW (ref 0.450–4.500)

## 2018-07-26 MED ORDER — LEVOTHYROXINE SODIUM 88 MCG PO TABS: 88.0000 ug | ORAL_TABLET | Freq: Every day | ORAL | 1 refills | Status: DC

## 2018-09-20 ENCOUNTER — Other Ambulatory Visit: Payer: Self-pay | Admitting: *Deleted

## 2018-09-20 DIAGNOSIS — K219 Gastro-esophageal reflux disease without esophagitis: Secondary | ICD-10-CM

## 2018-09-20 MED ORDER — PANTOPRAZOLE SODIUM 40 MG PO TBEC: 40.0000 mg | DELAYED_RELEASE_TABLET | Freq: Every day | ORAL | 1 refills | Status: DC

## 2018-10-08 ENCOUNTER — Other Ambulatory Visit: Payer: Self-pay

## 2018-10-09 ENCOUNTER — Encounter: Payer: Self-pay | Admitting: Family

## 2018-10-09 ENCOUNTER — Other Ambulatory Visit: Payer: Self-pay | Admitting: Family

## 2018-10-09 ENCOUNTER — Ambulatory Visit (INDEPENDENT_AMBULATORY_CARE_PROVIDER_SITE_OTHER): Payer: PPO | Admitting: Family

## 2018-10-09 VITALS — BP 149/91 | HR 83 | Ht 64.0 in | Wt 175.6 lb

## 2018-10-09 DIAGNOSIS — E039 Hypothyroidism, unspecified: Secondary | ICD-10-CM

## 2018-10-09 DIAGNOSIS — D509 Iron deficiency anemia, unspecified: Secondary | ICD-10-CM | POA: Diagnosis not present

## 2018-10-09 DIAGNOSIS — J309 Allergic rhinitis, unspecified: Secondary | ICD-10-CM | POA: Diagnosis not present

## 2018-10-09 DIAGNOSIS — I350 Nonrheumatic aortic (valve) stenosis: Secondary | ICD-10-CM | POA: Insufficient documentation

## 2018-10-09 DIAGNOSIS — E669 Obesity, unspecified: Secondary | ICD-10-CM

## 2018-10-09 DIAGNOSIS — R011 Cardiac murmur, unspecified: Secondary | ICD-10-CM | POA: Diagnosis not present

## 2018-10-09 DIAGNOSIS — K219 Gastro-esophageal reflux disease without esophagitis: Secondary | ICD-10-CM | POA: Diagnosis not present

## 2018-10-09 DIAGNOSIS — I499 Cardiac arrhythmia, unspecified: Secondary | ICD-10-CM

## 2018-10-09 DIAGNOSIS — Z1231 Encounter for screening mammogram for malignant neoplasm of breast: Secondary | ICD-10-CM

## 2018-10-09 DIAGNOSIS — I10 Essential (primary) hypertension: Secondary | ICD-10-CM | POA: Diagnosis not present

## 2018-10-09 MED ORDER — BENAZEPRIL-HYDROCHLOROTHIAZIDE 20-25 MG PO TABS: 1.0000 | ORAL_TABLET | Freq: Every day | ORAL | 1 refills | Status: DC

## 2018-10-09 MED ORDER — PANTOPRAZOLE SODIUM 40 MG PO TBEC: 40.0000 mg | DELAYED_RELEASE_TABLET | Freq: Every day | ORAL | 1 refills | Status: DC

## 2018-10-09 MED ORDER — LEVOCETIRIZINE DIHYDROCHLORIDE 5 MG PO TABS: ORAL_TABLET | ORAL | 1 refills | Status: DC

## 2018-10-09 NOTE — Progress Notes (Signed)
Subjective:    Patient ID: Autumn Moyer, female    DOB: 12/22/1940, 78 y.o.   MRN: [id number]  Chief Complaint  Patient presents with  . Hypertension  . Hypothyroidism   Pt presents to the office today for chronic follow up. She is followed by Cardiologists annually for Aortic stenosis and gets an Echo each year.   Hypertension This is a chronic problem. The current episode started more than 1 year ago. The problem has been waxing and waning since onset. The problem is controlled. Associated symptoms include malaise/fatigue. Pertinent negatives include no headaches, peripheral edema or shortness of breath. Risk factors for coronary artery disease include dyslipidemia, obesity and sedentary lifestyle. The current treatment provides moderate improvement. There is no history of kidney disease, CAD/MI or heart failure. Identifiable causes of hypertension include a thyroid problem.  Thyroid Problem Presents for follow-up visit. Symptoms include heat intolerance. Patient reports no constipation, depressed mood, fatigue, visual change or weight gain. The symptoms have been stable. There is no history of heart failure.  Anemia Presents for follow-up visit. Symptoms include malaise/fatigue. There has been no bruising/bleeding easily or leg swelling. There is no history of heart failure.  Gastroesophageal Reflux She complains of belching and heartburn. She reports no coughing. This is a chronic problem. The current episode started more than 1 year ago. The problem occurs occasionally. The problem has been waxing and waning. The symptoms are aggravated by certain foods. Pertinent negatives include no fatigue. She has tried a PPI for the symptoms. The treatment provided moderate relief.      Review of Systems  Constitutional: Positive for malaise/fatigue. Negative for fatigue and weight gain.  Respiratory: Negative for cough and shortness of breath.   Gastrointestinal: Positive for heartburn.  Negative for constipation.  Endocrine: Positive for heat intolerance.  Neurological: Negative for headaches.  Hematological: Does not bruise/bleed easily.  All other systems reviewed and are negative.      Objective:   Physical Exam Vitals signs reviewed.  Constitutional:      General: She is not in acute distress.    Appearance: She is well-developed.  HENT:     Head: Normocephalic and atraumatic.  Eyes:     Pupils: Pupils are equal, round, and reactive to light.  Neck:     Musculoskeletal: Normal range of motion and neck supple.     Thyroid: No thyromegaly.  Cardiovascular:     Rate and Rhythm: Normal rate. Rhythm irregular.     Heart sounds: Murmur present.  Pulmonary:     Effort: Pulmonary effort is normal. No respiratory distress.     Breath sounds: Normal breath sounds. No wheezing.  Abdominal:     General: Bowel sounds are normal. There is no distension.     Palpations: Abdomen is soft.     Tenderness: There is no abdominal tenderness.  Musculoskeletal: Normal range of motion.        General: No tenderness.  Skin:    General: Skin is warm and dry.  Neurological:     Mental Status: She is alert and oriented to person, place, and time.     Cranial Nerves: No cranial nerve deficit.     Deep Tendon Reflexes: Reflexes are normal and symmetric.  Psychiatric:        Behavior: Behavior normal.        Thought Content: Thought content normal.        Judgment: Judgment normal.       BP (!) 149/91  Pulse 83   Ht _0  (1.626 m)   Wt 175 lb 9.6 oz (79.7 kg)   BMI 30.14 kg/m      Assessment & Plan:  Autumn Moyer comes in today with chief complaint of Hypertension and Hypothyroidism   Diagnosis and orders addressed:  1. Essential hypertension - benazepril-hydrochlorthiazide (LOTENSIN HCT) 20-25 MG tablet; Take 1 tablet by mouth daily.  Dispense: 90 tablet; Refill: 1 - CMP14+EGFR - CBC with Differential/Platelet  2. Allergic rhinitis, unspecified  seasonality, unspecified trigger - levocetirizine (XYZAL) 5 MG tablet; TAKE 1 TABLET BY MOUTH IN THE EVENING  Dispense: 90 tablet; Refill: 1 - CMP14+EGFR - CBC with Differential/Platelet  3. Gastroesophageal reflux disease, esophagitis presence not specified - pantoprazole (PROTONIX) 40 MG tablet; Take 1 tablet (40 mg total) by mouth daily.  Dispense: 90 tablet; Refill: 1 - CMP14+EGFR - CBC with Differential/Platelet  4. Iron deficiency anemia, unspecified iron deficiency anemia type - CMP14+EGFR - CBC with Differential/Platelet  5. Murmur - CMP14+EGFR - CBC with Differential/Platelet  6. Obesity (BMI 30.0-34.9) - CMP14+EGFR - CBC with Differential/Platelet  7. Hypothyroidism, unspecified type - CMP14+EGFR - CBC with Differential/Platelet - Thyroid Panel With TSH  8. Essential hypertension, benign - CMP14+EGFR - CBC with Differential/Platelet  9. Aortic valve stenosis, etiology of cardiac valve disease unspecified - CMP14+EGFR - CBC with Differential/Platelet - EKG 12-Lead  10. Irregular heart rate Pt will contact her Cardiologists and get a Holter monitor placed, as I believe she is having episodes  of A Fib - CMP14+EGFR - CBC with Differential/Platelet - EKG 12-Lead   Labs pending Health Maintenance reviewed Diet and exercise encouraged  Follow up plan: 6 months    Evelina Dun, FNP

## 2018-10-09 NOTE — Patient Instructions (Signed)
Hypothyroidism  Hypothyroidism is when the thyroid gland does not make enough of certain hormones (it is underactive). The thyroid gland is a small gland located in the lower front part of the neck, just in front of the windpipe (trachea). This gland makes hormones that help control how the body uses food for energy (metabolism) as well as how the heart and brain function. These hormones also play a role in keeping your bones strong. When the thyroid is underactive, it produces too little of the hormones thyroxine (T4) and triiodothyronine (T3). What are the causes? This condition may be caused by:  Hashimoto's disease. This is a disease in which the body's disease-fighting system (immune system) attacks the thyroid gland. This is the most common cause.  Viral infections.  Pregnancy.  Certain medicines.  Birth defects.  Past radiation treatments to the head or neck for cancer.  Past treatment with radioactive iodine.  Past exposure to radiation in the environment.  Past surgical removal of part or all of the thyroid.  Problems with a gland in the center of the brain (pituitary gland).  Lack of enough iodine in the diet. What increases the risk? You are more likely to develop this condition if:  You are female.  You have a family history of thyroid conditions.  You use a medicine called lithium.  You take medicines that affect the immune system (immunosuppressants). What are the signs or symptoms? Symptoms of this condition include:  Feeling as though you have no energy (lethargy).  Not being able to tolerate cold.  Weight gain that is not explained by a change in diet or exercise habits.  Lack of appetite.  Dry skin.  Coarse hair.  Menstrual irregularity.  Slowing of thought processes.  Constipation.  Sadness or depression. How is this diagnosed? This condition may be diagnosed based on:  Your symptoms, your medical history, and a physical exam.  Blood  tests. You may also have imaging tests, such as an ultrasound or MRI. How is this treated? This condition is treated with medicine that replaces the thyroid hormones that your body does not make. After you begin treatment, it may take several weeks for symptoms to go away. Follow these instructions at home:  Take over-the-counter and prescription medicines only as told by your health care provider.  If you start taking any new medicines, tell your health care provider.  Keep all follow-up visits as told by your health care provider. This is important. ? As your condition improves, your dosage of thyroid hormone medicine may change. ? You will need to have blood tests regularly so that your health care provider can monitor your condition. Contact a health care provider if:  Your symptoms do not get better with treatment.  You are taking thyroid replacement medicine and you: ? Sweat a lot. ? Have tremors. ? Feel anxious. ? Lose weight rapidly. ? Cannot tolerate heat. ? Have emotional swings. ? Have diarrhea. ? Feel weak. Get help right away if you have:  Chest pain.  An irregular heartbeat.  A rapid heartbeat.  Difficulty breathing. Summary  Hypothyroidism is when the thyroid gland does not make enough of certain hormones (it is underactive).  When the thyroid is underactive, it produces too little of the hormones thyroxine (T4) and triiodothyronine (T3).  The most common cause is Hashimoto's disease, a disease in which the body's disease-fighting system (immune system) attacks the thyroid gland. The condition can also be caused by viral infections, medicine, pregnancy, or past   radiation treatment to the head or neck.  Symptoms may include weight gain, dry skin, constipation, feeling as though you do not have energy, and not being able to tolerate cold.  This condition is treated with medicine to replace the thyroid hormones that your body does not make. This information  is not intended to replace advice given to you by your health care provider. Make sure you discuss any questions you have with your health care provider. Document Released: 03/07/2005 Document Revised: 02/17/2017 Document Reviewed: 02/15/2017 Elsevier Patient Education  2020 [employer]  

## 2018-10-10 LAB — CBC WITH DIFFERENTIAL/PLATELET
Basophils Absolute: 0.1 10*3/uL (ref 0.0–0.2)
Basos: 1 %
EOS (ABSOLUTE): 0.2 10*3/uL (ref 0.0–0.4)
Eos: 3 %
Hematocrit: 41 % (ref 34.0–46.6)
Hemoglobin: 13.7 g/dL (ref 11.1–15.9)
Immature Grans (Abs): 0.1 10*3/uL (ref 0.0–0.1)
Immature Granulocytes: 1 %
Lymphocytes Absolute: 2.3 10*3/uL (ref 0.7–3.1)
Lymphs: 31 %
MCH: 31.5 pg (ref 26.6–33.0)
MCHC: 33.4 g/dL (ref 31.5–35.7)
MCV: 94 fL (ref 79–97)
Monocytes Absolute: 0.7 10*3/uL (ref 0.1–0.9)
Monocytes: 9 %
Neutrophils Absolute: 4 10*3/uL (ref 1.4–7.0)
Neutrophils: 55 %
Platelets: 317 10*3/uL (ref 150–450)
RBC: 4.35 x10E6/uL (ref 3.77–5.28)
RDW: 13.2 % (ref 11.7–15.4)
WBC: 7.3 10*3/uL (ref 3.4–10.8)

## 2018-10-10 LAB — CMP14+EGFR
ALT: 18 IU/L (ref 0–32)
AST: 19 IU/L (ref 0–40)
Albumin/Globulin Ratio: 1.7 (ref 1.2–2.2)
Albumin: 4.5 g/dL (ref 3.7–4.7)
Alkaline Phosphatase: 50 IU/L (ref 39–117)
BUN/Creatinine Ratio: 18 (ref 12–28)
BUN: 20 mg/dL (ref 8–27)
Bilirubin Total: 1 mg/dL (ref 0.0–1.2)
CO2: 22 mmol/L (ref 20–29)
Calcium: 9.9 mg/dL (ref 8.7–10.3)
Chloride: 105 mmol/L (ref 96–106)
Creatinine, Ser: 1.11 mg/dL — ABNORMAL HIGH (ref 0.57–1.00)
GFR calc Af Amer: 56 mL/min/{1.73_m2} — ABNORMAL LOW (ref >59)
GFR calc non Af Amer: 49 mL/min/{1.73_m2} — ABNORMAL LOW (ref >59)
Globulin, Total: 2.7 g/dL (ref 1.5–4.5)
Glucose: 106 mg/dL — ABNORMAL HIGH (ref 65–99)
Potassium: 4.3 mmol/L (ref 3.5–5.2)
Sodium: 144 mmol/L (ref 134–144)
Total Protein: 7.2 g/dL (ref 6.0–8.5)

## 2018-10-10 LAB — THYROID PANEL WITH TSH
Free Thyroxine Index: 2.7 (ref 1.2–4.9)
T3 Uptake Ratio: 31 % (ref 24–39)
T4, Total: 8.7 ug/dL (ref 4.5–12.0)
TSH: 0.97 u[IU]/mL (ref 0.450–4.500)

## 2018-10-25 ENCOUNTER — Other Ambulatory Visit: Payer: Self-pay

## 2018-10-25 ENCOUNTER — Ambulatory Visit (HOSPITAL_COMMUNITY): Payer: PPO | Attending: Cardiology

## 2018-10-25 DIAGNOSIS — I10 Essential (primary) hypertension: Secondary | ICD-10-CM | POA: Diagnosis not present

## 2018-10-25 DIAGNOSIS — I350 Nonrheumatic aortic (valve) stenosis: Secondary | ICD-10-CM

## 2018-11-22 ENCOUNTER — Other Ambulatory Visit: Payer: Self-pay

## 2018-11-22 ENCOUNTER — Ambulatory Visit
Admission: RE | Admit: 2018-11-22 | Discharge: 2018-11-22 | Disposition: A | Discharge status: Home / Self Care | Payer: PPO | Source: Ambulatory Visit | Attending: Family | Admitting: Family

## 2018-11-22 DIAGNOSIS — Z1231 Encounter for screening mammogram for malignant neoplasm of breast: Secondary | ICD-10-CM | POA: Diagnosis not present

## 2018-12-14 ENCOUNTER — Other Ambulatory Visit: Payer: Self-pay

## 2018-12-17 ENCOUNTER — Other Ambulatory Visit: Payer: Self-pay

## 2018-12-17 ENCOUNTER — Ambulatory Visit (INDEPENDENT_AMBULATORY_CARE_PROVIDER_SITE_OTHER): Payer: PPO

## 2018-12-17 DIAGNOSIS — Z23 Encounter for immunization: Secondary | ICD-10-CM | POA: Diagnosis not present

## 2019-01-15 ENCOUNTER — Other Ambulatory Visit: Payer: Self-pay | Admitting: Family

## 2019-02-26 DIAGNOSIS — Z7189 Other specified counseling: Secondary | ICD-10-CM | POA: Insufficient documentation

## 2019-02-26 NOTE — Progress Notes (Signed)
Cardiology Office Note   Date:  02/27/2019   ID:  Penina, Soder 1941-03-03, MRN [id number]  PCP:  Sharion Balloon, FNP  Cardiologist:   Minus Breeding, MD   Chief Complaint  Patient presents with  . Aortic Stenosis      History of Present Illness: Autumn Moyer is a 78 y.o. female who presents for evaluation of aortic stenosis. Her AS was moderate in August in 2020.  Since I last saw her she has done well.  She get a little dyspneic walking up the stairs with the laundry but she otherwise feels well.  The patient denies any new symptoms such as chest discomfort, neck or arm discomfort. There has been no new shortness of breath, PND or orthopnea. There have been no reported palpitations, presyncope or syncope.    Past Medical History:  Diagnosis Date  . Hypertension   . Thyroid disease     Past Surgical History:  Procedure Laterality Date  . EYE SURGERY    . TOE SURGERY    . TUBAL LIGATION       Current Outpatient Medications  Medication Sig Dispense Refill  . benazepril-hydrochlorthiazide (LOTENSIN HCT) 20-25 MG tablet Take 1 tablet by mouth daily. 90 tablet 1  . EUTHYROX 88 MCG tablet Take 1 tablet by mouth once daily 90 tablet 2  . levocetirizine (XYZAL) 5 MG tablet TAKE 1 TABLET BY MOUTH IN THE EVENING 90 tablet 1  . Lysine 500 MG TABS Take by mouth 2 (two) times daily.     . Omega-3 Fatty Acids (FISH OIL) 500 MG CAPS Take by mouth daily.    . pantoprazole (PROTONIX) 40 MG tablet Take 1 tablet (40 mg total) by mouth daily. 90 tablet 1  . triamcinolone ointment (KENALOG) 0.1 % Apply 1 application topically 2 (two) times daily. 80 g 0  . Multiple Vitamin (MULTIVITAMIN) capsule Take 1 capsule by mouth daily.     No current facility-administered medications for this visit.     Allergies:   Noroxin [norfloxacin]    ROS:  Please see the history of present illness.   Otherwise, review of systems are positive for none.   All other systems are reviewed and  negative.    PHYSICAL EXAM: VS:  BP (!) 150/84   Pulse 72   Ht 5\' 4"  (1.626 m)   Wt 172 lb (78 kg)   BMI 29.52 kg/m  , BMI Body mass index is 29.52 kg/m. GENERAL:  Well appearing NECK:  No jugular venous distention, waveform within normal limits, carotid upstroke brisk and symmetric, no bruits, no thyromegaly LUNGS:  Clear to auscultation bilaterally CHEST:  Unremarkable HEART:  PMI not displaced or sustained,S1 and S2 within normal limits, no S3, no S4, no clicks, no rubs, no murmurs ABD:  Flat, positive bowel sounds normal in frequency in pitch, no bruits, no rebound, no guarding, no midline pulsatile mass, no hepatomegaly, no splenomegaly EXT:  2 plus pulses throughout, no edema, no cyanosis no clubbing   EKG:  EKG is notordered today.   Recent Labs: 10/09/2018: ALT 18; BUN 20; Creatinine, Ser 1.11; Hemoglobin 13.7; Platelets 317; Potassium 4.3; Sodium 144; TSH 0.970    Lipid Panel No results found for: CHOL, TRIG, HDL, CHOLHDL, VLDL, LDLCALC, LDLDIRECT    Wt Readings from Last 3 Encounters:  02/27/19 172 lb (78 kg)  10/09/18 175 lb 9.6 oz (79.7 kg)  07/25/18 179 lb (81.2 kg)      Other studies Reviewed:  Additional studies/ records that were reviewed today include: Echo Review of the above records demonstrates:       ASSESSMENT AND PLAN:  AS:    This is moderate no change in therapy is indicated.  I will look at this again in a couple of years.  HTN:   The blood pressure is elevated but she says it is not at home.  She is not checking it frequently and has a wrist cuff.  She is getting get an upper arm cuff and keep a blood pressure diary and will let me know and I will modify her medicines as needed.   COVID EDUCATION:   She would want to get the vaccine when available.   Current medicines are reviewed at length with the patient today.  The patient does not have concerns regarding medicines.  The following changes have been made:   None  Disposition:    Follow up with me in two years.   Signed, Minus Breeding, MD  02/27/2019 12:51 PM    [facility]

## 2019-02-27 ENCOUNTER — Other Ambulatory Visit: Payer: Self-pay

## 2019-02-27 ENCOUNTER — Ambulatory Visit: Payer: PPO | Admitting: Cardiology

## 2019-02-27 ENCOUNTER — Encounter: Payer: Self-pay | Admitting: Cardiology

## 2019-02-27 VITALS — BP 150/84 | HR 72 | Ht 64.0 in | Wt 172.0 lb

## 2019-02-27 DIAGNOSIS — I10 Essential (primary) hypertension: Secondary | ICD-10-CM | POA: Diagnosis not present

## 2019-02-27 DIAGNOSIS — Z7189 Other specified counseling: Secondary | ICD-10-CM | POA: Diagnosis not present

## 2019-02-27 DIAGNOSIS — I350 Nonrheumatic aortic (valve) stenosis: Secondary | ICD-10-CM

## 2019-02-27 NOTE — Patient Instructions (Addendum)
Medication Instructions:  The current medical regimen is effective;  continue present plan and medications.  *If you need a refill on your cardiac medications before your next appointment, please call your pharmacy*  Follow-Up: At [facility], you and your health needs are our priority.  As part of our continuing mission to provide you with exceptional heart care, we have created designated Provider Care Teams.  These Care Teams include your primary Cardiologist (physician) and Advanced Practice Providers (APPs -  Physician Assistants and Nurse Practitioners) who all work together to provide you with the care you need, when you need it.  Your next appointment:   2 year(s)  The format for your next appointment:   In Person  Provider:   Minus Breeding, MD  Thank you for choosing [facility]!!    [facility]

## 2019-03-08 ENCOUNTER — Other Ambulatory Visit: Payer: PPO

## 2019-04-11 ENCOUNTER — Other Ambulatory Visit: Payer: Self-pay | Admitting: Family

## 2019-04-11 DIAGNOSIS — I10 Essential (primary) hypertension: Secondary | ICD-10-CM

## 2019-04-11 DIAGNOSIS — J309 Allergic rhinitis, unspecified: Secondary | ICD-10-CM

## 2019-04-11 NOTE — Telephone Encounter (Signed)
Needs appt for further refills 30 day supply sent Last OV 10/09/18

## 2019-05-13 ENCOUNTER — Other Ambulatory Visit: Payer: Self-pay

## 2019-05-14 ENCOUNTER — Encounter: Payer: Self-pay | Admitting: Family

## 2019-05-14 ENCOUNTER — Ambulatory Visit (INDEPENDENT_AMBULATORY_CARE_PROVIDER_SITE_OTHER): Payer: PPO

## 2019-05-14 ENCOUNTER — Ambulatory Visit (INDEPENDENT_AMBULATORY_CARE_PROVIDER_SITE_OTHER): Payer: PPO | Admitting: Family

## 2019-05-14 VITALS — BP 136/80 | HR 91 | Temp 98.7°F | Ht 64.0 in | Wt 173.0 lb

## 2019-05-14 DIAGNOSIS — I350 Nonrheumatic aortic (valve) stenosis: Secondary | ICD-10-CM

## 2019-05-14 DIAGNOSIS — E039 Hypothyroidism, unspecified: Secondary | ICD-10-CM | POA: Diagnosis not present

## 2019-05-14 DIAGNOSIS — Z780 Asymptomatic menopausal state: Secondary | ICD-10-CM | POA: Diagnosis not present

## 2019-05-14 DIAGNOSIS — D509 Iron deficiency anemia, unspecified: Secondary | ICD-10-CM

## 2019-05-14 DIAGNOSIS — E669 Obesity, unspecified: Secondary | ICD-10-CM

## 2019-05-14 DIAGNOSIS — R0602 Shortness of breath: Secondary | ICD-10-CM

## 2019-05-14 DIAGNOSIS — J309 Allergic rhinitis, unspecified: Secondary | ICD-10-CM

## 2019-05-14 DIAGNOSIS — I10 Essential (primary) hypertension: Secondary | ICD-10-CM

## 2019-05-14 MED ORDER — LEVOCETIRIZINE DIHYDROCHLORIDE 5 MG PO TABS: 5.0000 mg | ORAL_TABLET | Freq: Every evening | ORAL | 0 refills | Status: DC

## 2019-05-14 MED ORDER — BENAZEPRIL-HYDROCHLOROTHIAZIDE 20-25 MG PO TABS: 1.0000 | ORAL_TABLET | Freq: Every day | ORAL | 0 refills | Status: DC

## 2019-05-14 NOTE — Patient Instructions (Signed)
Shortness of Breath, Adult Shortness of breath is when a person has trouble breathing enough air or when a person feels like she or he is having trouble breathing in enough air. Shortness of breath could be a sign of a medical problem. Follow these instructions at home:   Pay attention to any changes in your symptoms.  Do not use any products that contain nicotine or tobacco, such as cigarettes, e-cigarettes, and chewing tobacco.  Do not smoke. Smoking is a common cause of shortness of breath. If you need help quitting, ask your health care provider.  Avoid things that can irritate your airways, such as: ? Mold. ? Dust. ? Air pollution. ? Chemical fumes. ? Things that can cause allergy symptoms (allergens), if you have allergies.  Keep your living space clean and free of mold and dust.  Rest as needed. Slowly return to your usual activities.  Take over-the-counter and prescription medicines only as told by your health care provider. This includes oxygen therapy and inhaled medicines.  Keep all follow-up visits as told by your health care provider. This is important. Contact a health care provider if:  Your condition does not improve as soon as expected.  You have a hard time doing your normal activities, even after you rest.  You have new symptoms. Get help right away if:  Your shortness of breath gets worse.  You have shortness of breath when you are resting.  You feel light-headed or you faint.  You have a cough that is not controlled with medicines.  You cough up blood.  You have pain with breathing.  You have pain in your chest, arms, shoulders, or abdomen.  You have a fever.  You cannot walk up stairs or exercise the way that you normally do. These symptoms may represent a serious problem that is an emergency. Do not wait to see if the symptoms will go away. Get medical help right away. Call your local emergency services (911 in the [place] Do not drive yourself  to the hospital. Summary  Shortness of breath is when a person has trouble breathing enough air. It can be a sign of a medical problem.  Avoid things that irritate your lungs, such as smoking, pollution, mold, and dust.  Pay attention to changes in your symptoms and contact your health care provider if you have a hard time completing daily activities because of shortness of breath. This information is not intended to replace advice given to you by your health care provider. Make sure you discuss any questions you have with your health care provider. Document Revised: 08/07/2017 Document Reviewed: 08/07/2017 Elsevier Patient Education  [place]. Hypothyroidism  Hypothyroidism is when the thyroid gland does not make enough of certain hormones (it is underactive). The thyroid gland is a small gland located in the lower front part of the neck, just in front of the windpipe (trachea). This gland makes hormones that help control how the body uses food for energy (metabolism) as well as how the heart and brain function. These hormones also play a role in keeping your bones strong. When the thyroid is underactive, it produces too little of the hormones thyroxine (T4) and triiodothyronine (T3). What are the causes? This condition may be caused by:  Hashimoto's disease. This is a disease in which the body's disease-fighting system (immune system) attacks the thyroid gland. This is the most common cause.  Viral infections.  Pregnancy.  Certain medicines.  Birth defects.  Past radiation treatments to  the head or neck for cancer.  Past treatment with radioactive iodine.  Past exposure to radiation in the environment.  Past surgical removal of part or all of the thyroid.  Problems with a gland in the center of the brain (pituitary gland).  Lack of enough iodine in the diet. What increases the risk? You are more likely to develop this condition if:  You are female.  You have a  family history of thyroid conditions.  You use a medicine called lithium.  You take medicines that affect the immune system (immunosuppressants). What are the signs or symptoms? Symptoms of this condition include:  Feeling as though you have no energy (lethargy).  Not being able to tolerate cold.  Weight gain that is not explained by a change in diet or exercise habits.  Lack of appetite.  Dry skin.  Coarse hair.  Menstrual irregularity.  Slowing of thought processes.  Constipation.  Sadness or depression. How is this diagnosed? This condition may be diagnosed based on:  Your symptoms, your medical history, and a physical exam.  Blood tests. You may also have imaging tests, such as an ultrasound or MRI. How is this treated? This condition is treated with medicine that replaces the thyroid hormones that your body does not make. After you begin treatment, it may take several weeks for symptoms to go away. Follow these instructions at home:  Take over-the-counter and prescription medicines only as told by your health care provider.  If you start taking any new medicines, tell your health care provider.  Keep all follow-up visits as told by your health care provider. This is important. ? As your condition improves, your dosage of thyroid hormone medicine may change. ? You will need to have blood tests regularly so that your health care provider can monitor your condition. Contact a health care provider if:  Your symptoms do not get better with treatment.  You are taking thyroid replacement medicine and you: ? Sweat a lot. ? Have tremors. ? Feel anxious. ? Lose weight rapidly. ? Cannot tolerate heat. ? Have emotional swings. ? Have diarrhea. ? Feel weak. Get help right away if you have:  Chest pain.  An irregular heartbeat.  A rapid heartbeat.  Difficulty breathing. Summary  Hypothyroidism is when the thyroid gland does not make enough of certain  hormones (it is underactive).  When the thyroid is underactive, it produces too little of the hormones thyroxine (T4) and triiodothyronine (T3).  The most common cause is Hashimoto's disease, a disease in which the body's disease-fighting system (immune system) attacks the thyroid gland. The condition can also be caused by viral infections, medicine, pregnancy, or past radiation treatment to the head or neck.  Symptoms may include weight gain, dry skin, constipation, feeling as though you do not have energy, and not being able to tolerate cold.  This condition is treated with medicine to replace the thyroid hormones that your body does not make. This information is not intended to replace advice given to you by your health care provider. Make sure you discuss any questions you have with your health care provider. Document Revised: 02/17/2017 Document Reviewed: 02/15/2017 Elsevier Patient Education  2020 [employer].

## 2019-05-14 NOTE — Progress Notes (Signed)
Subjective:    Patient ID: Autumn Moyer, female    DOB: 1940-11-10, 79 y.o.   MRN: [id number]  Chief Complaint  Patient presents with  . Hypertension    3 MTH    Pt presents to the office today for chronic follow up. She is followed by Cardiologists annually for Aortic stenosis and gets an Echo each year.   Hypertension This is a chronic problem. The current episode started more than 1 year ago. The problem has been resolved since onset. The problem is controlled. Associated symptoms include shortness of breath ("some times, but not regularly" ). Pertinent negatives include no malaise/fatigue or peripheral edema. Risk factors for coronary artery disease include sedentary lifestyle. The current treatment provides moderate improvement. There is no history of kidney disease, CAD/MI, CVA or heart failure. Identifiable causes of hypertension include a thyroid problem.  Thyroid Problem Presents for follow-up visit. Symptoms include hair loss. Patient reports no cold intolerance, depressed mood, diarrhea, fatigue or leg swelling. The symptoms have been stable. There is no history of heart failure.  Anemia Presents for follow-up visit. There has been no bruising/bleeding easily or malaise/fatigue. There is no history of heart failure.      Review of Systems  Constitutional: Negative for fatigue and malaise/fatigue.  Respiratory: Positive for shortness of breath ("some times, but not regularly" ).   Gastrointestinal: Negative for diarrhea.  Endocrine: Negative for cold intolerance.  Hematological: Does not bruise/bleed easily.  All other systems reviewed and are negative.      Objective:   Physical Exam Vitals reviewed.  Constitutional:      General: She is not in acute distress.    Appearance: She is well-developed.  HENT:     Head: Normocephalic and atraumatic.     Right Ear: Tympanic membrane normal.     Left Ear: Tympanic membrane normal.  Eyes:     Pupils: Pupils are equal,  round, and reactive to light.  Neck:     Thyroid: No thyromegaly.  Cardiovascular:     Rate and Rhythm: Normal rate and regular rhythm.     Heart sounds: Murmur present.  Pulmonary:     Effort: Pulmonary effort is normal. No respiratory distress.     Breath sounds: Normal breath sounds. No wheezing.  Abdominal:     General: Bowel sounds are normal. There is no distension.     Palpations: Abdomen is soft.     Tenderness: There is no abdominal tenderness.  Musculoskeletal:        General: No tenderness. Normal range of motion.     Cervical back: Normal range of motion and neck supple.  Skin:    General: Skin is warm and dry.  Neurological:     Mental Status: She is alert and oriented to person, place, and time.     Cranial Nerves: No cranial nerve deficit.     Deep Tendon Reflexes: Reflexes are normal and symmetric.  Psychiatric:        Behavior: Behavior normal.        Thought Content: Thought content normal.        Judgment: Judgment normal.       BP 136/80   Pulse 91   Temp 98.7 F (37.1 C) (Oral)   Ht 5' 4"  (1.626 m)   Wt 173 lb (78.5 kg)   SpO2 96%   BMI 29.70 kg/m      Assessment & Plan:  Autumn Moyer comes in today with chief complaint of  Hypertension (3 MTH )   Diagnosis and orders addressed:  1. Allergic rhinitis, unspecified seasonality, unspecified trigger - levocetirizine (XYZAL) 5 MG tablet; Take 1 tablet (5 mg total) by mouth every evening.  Dispense: 90 tablet; Refill: 0 - CMP14+EGFR - CBC with Differential/Platelet  2. Essential hypertension - benazepril-hydrochlorthiazide (LOTENSIN HCT) 20-25 MG tablet; Take 1 tablet by mouth daily.  Dispense: 30 tablet; Refill: 0 - CMP14+EGFR - CBC with Differential/Platelet  3. Essential hypertension, benign - CMP14+EGFR - CBC with Differential/Platelet  4. Hypothyroidism, unspecified type - CMP14+EGFR - CBC with Differential/Platelet - TSH  5. Iron deficiency anemia, unspecified iron deficiency  anemia type - CMP14+EGFR - CBC with Differential/Platelet  6. Obesity (BMI 30.0-34.9) - CMP14+EGFR - CBC with Differential/Platelet  7. Post-menopausal - CMP14+EGFR - CBC with Differential/Platelet  8. Aortic valve stenosis, etiology of cardiac valve disease unspecified - CMP14+EGFR - CBC with Differential/Platelet  9. SOB (shortness of breath) on exertion More than likely related to deconditioning  - CMP14+EGFR - CBC with Differential/Platelet - DG Chest 2 View; Future   Labs pending Health Maintenance reviewed Diet and exercise encouraged  Follow up plan: 6 months    Evelina Dun, FNP

## 2019-05-15 ENCOUNTER — Telehealth: Payer: Self-pay | Admitting: Family

## 2019-05-15 DIAGNOSIS — I10 Essential (primary) hypertension: Secondary | ICD-10-CM

## 2019-05-15 LAB — CBC WITH DIFFERENTIAL/PLATELET
Basophils Absolute: 0.1 10*3/uL (ref 0.0–0.2)
Basos: 1 %
EOS (ABSOLUTE): 0.3 10*3/uL (ref 0.0–0.4)
Eos: 3 %
Hematocrit: 41.2 % (ref 34.0–46.6)
Hemoglobin: 14.7 g/dL (ref 11.1–15.9)
Immature Grans (Abs): 0.1 10*3/uL (ref 0.0–0.1)
Immature Granulocytes: 1 %
Lymphocytes Absolute: 3 10*3/uL (ref 0.7–3.1)
Lymphs: 33 %
MCH: 31.7 pg (ref 26.6–33.0)
MCHC: 35.7 g/dL (ref 31.5–35.7)
MCV: 89 fL (ref 79–97)
Monocytes Absolute: 0.7 10*3/uL (ref 0.1–0.9)
Monocytes: 8 %
Neutrophils Absolute: 4.9 10*3/uL (ref 1.4–7.0)
Neutrophils: 54 %
Platelets: 323 10*3/uL (ref 150–450)
RBC: 4.64 x10E6/uL (ref 3.77–5.28)
RDW: 12.3 % (ref 11.7–15.4)
WBC: 9 10*3/uL (ref 3.4–10.8)

## 2019-05-15 LAB — CMP14+EGFR
ALT: 20 IU/L (ref 0–32)
AST: 25 IU/L (ref 0–40)
Albumin/Globulin Ratio: 1.6 (ref 1.2–2.2)
Albumin: 4.5 g/dL (ref 3.7–4.7)
Alkaline Phosphatase: 62 IU/L (ref 39–117)
BUN/Creatinine Ratio: 22 (ref 12–28)
BUN: 20 mg/dL (ref 8–27)
Bilirubin Total: 0.9 mg/dL (ref 0.0–1.2)
CO2: 24 mmol/L (ref 20–29)
Calcium: 9.8 mg/dL (ref 8.7–10.3)
Chloride: 101 mmol/L (ref 96–106)
Creatinine, Ser: 0.9 mg/dL (ref 0.57–1.00)
GFR calc Af Amer: 72 mL/min/{1.73_m2} (ref >59)
GFR calc non Af Amer: 62 mL/min/{1.73_m2} (ref >59)
Globulin, Total: 2.8 g/dL (ref 1.5–4.5)
Glucose: 104 mg/dL — ABNORMAL HIGH (ref 65–99)
Potassium: 3.8 mmol/L (ref 3.5–5.2)
Sodium: 144 mmol/L (ref 134–144)
Total Protein: 7.3 g/dL (ref 6.0–8.5)

## 2019-05-15 LAB — TSH: TSH: 0.36 u[IU]/mL — ABNORMAL LOW (ref 0.450–4.500)

## 2019-05-15 MED ORDER — BENAZEPRIL-HYDROCHLOROTHIAZIDE 20-25 MG PO TABS: 1.0000 | ORAL_TABLET | Freq: Every day | ORAL | 1 refills | Status: DC

## 2019-05-15 NOTE — Telephone Encounter (Signed)
Pt aware refill sent to pharmacy 

## 2019-05-16 ENCOUNTER — Other Ambulatory Visit: Payer: Self-pay | Admitting: Family

## 2019-05-16 MED ORDER — LEVOTHYROXINE SODIUM 75 MCG PO TABS: 75.0000 ug | ORAL_TABLET | Freq: Every day | ORAL | 11 refills | Status: DC

## 2019-05-30 ENCOUNTER — Other Ambulatory Visit: Payer: Self-pay

## 2019-05-31 ENCOUNTER — Ambulatory Visit (INDEPENDENT_AMBULATORY_CARE_PROVIDER_SITE_OTHER): Payer: PPO

## 2019-05-31 ENCOUNTER — Other Ambulatory Visit: Payer: Self-pay | Admitting: Family

## 2019-05-31 DIAGNOSIS — R0602 Shortness of breath: Secondary | ICD-10-CM | POA: Diagnosis not present

## 2019-06-04 ENCOUNTER — Telehealth: Payer: Self-pay | Admitting: Family

## 2019-06-04 NOTE — Chronic Care Management (AMB) (Signed)
  Chronic Care Management   Outreach Note  06/04/2019 Name: Autumn Moyer MRN: [id number] DOB: [birth year]  Autumn Moyer is a 79 y.o. year old female who is a primary care patient of Sharion Balloon, FNP. I reached out to [name] by phone today in response to a referral sent by Ms. Christean Grief Venturino's health plan.     An unsuccessful telephone outreach was attempted today. The patient was referred to the case management team for assistance with care management and care coordination.   Follow Up Plan: A HIPPA compliant phone message was left for the patient providing contact information and requesting a return call. The care management team will reach out to the patient again over the next 7 days. If patient returns call to provider office, please advise to call [facility] at [phone].  [place], [place] [address] Direct Dial: [phone] [email or web address] Website: [email or web address]

## 2019-06-05 NOTE — Chronic Care Management (AMB) (Signed)
  Chronic Care Management   Note  06/05/2019 Name: Autumn Moyer MRN: [id number] DOB: 02-19-1941  Autumn Moyer is a 79 y.o. year old female who is a primary care patient of Sharion Balloon, FNP. I reached out to Autumn Moyer by phone today in response to a referral sent by Autumn Moyer's health plan.     Autumn Moyer was given information about Chronic Care Management services today including:  1. CCM service includes personalized support from designated clinical staff supervised by her physician, including individualized plan of care and coordination with other care providers 2. 24/7 contact phone numbers for assistance for urgent and routine care needs. 3. Service will only be billed when office clinical staff spend 20 minutes or more in a month to coordinate care. 4. Only one practitioner may furnish and bill the service in a calendar month. 5. The patient may stop CCM services at any time (effective at the end of the month) by phone call to the office staff. 6. The patient will be responsible for cost sharing (co-pay) of up to 20% of the service fee (after annual deductible is met).  Patient did not agree to enrollment in care management services and does not wish to consider at this time.  Follow up plan: The patient has been provided with contact information for the care management team and has been advised to call with any health related questions or concerns.   [place], [place] [address] Direct Dial: [phone] [email or web address]: [email or web address]

## 2019-08-14 ENCOUNTER — Other Ambulatory Visit: Payer: Self-pay | Admitting: Family

## 2019-08-14 DIAGNOSIS — Z1231 Encounter for screening mammogram for malignant neoplasm of breast: Secondary | ICD-10-CM

## 2019-09-18 ENCOUNTER — Other Ambulatory Visit: Payer: Self-pay

## 2019-09-18 ENCOUNTER — Ambulatory Visit (INDEPENDENT_AMBULATORY_CARE_PROVIDER_SITE_OTHER): Payer: PPO | Admitting: Family

## 2019-09-18 ENCOUNTER — Encounter: Payer: Self-pay | Admitting: Family

## 2019-09-18 VITALS — BP 111/61 | HR 84 | Temp 96.8°F | Ht 64.0 in | Wt 174.6 lb

## 2019-09-18 DIAGNOSIS — E669 Obesity, unspecified: Secondary | ICD-10-CM

## 2019-09-18 DIAGNOSIS — Z1159 Encounter for screening for other viral diseases: Secondary | ICD-10-CM | POA: Diagnosis not present

## 2019-09-18 DIAGNOSIS — D509 Iron deficiency anemia, unspecified: Secondary | ICD-10-CM | POA: Diagnosis not present

## 2019-09-18 DIAGNOSIS — E66811 Obesity, class 1: Secondary | ICD-10-CM

## 2019-09-18 DIAGNOSIS — E039 Hypothyroidism, unspecified: Secondary | ICD-10-CM | POA: Diagnosis not present

## 2019-09-18 DIAGNOSIS — K2100 Gastro-esophageal reflux disease with esophagitis, without bleeding: Secondary | ICD-10-CM

## 2019-09-18 DIAGNOSIS — I350 Nonrheumatic aortic (valve) stenosis: Secondary | ICD-10-CM

## 2019-09-18 DIAGNOSIS — K219 Gastro-esophageal reflux disease without esophagitis: Secondary | ICD-10-CM | POA: Diagnosis not present

## 2019-09-18 DIAGNOSIS — I10 Essential (primary) hypertension: Secondary | ICD-10-CM

## 2019-09-18 MED ORDER — BENAZEPRIL-HYDROCHLOROTHIAZIDE 20-25 MG PO TABS: 1.0000 | ORAL_TABLET | Freq: Every day | ORAL | 1 refills | Status: DC

## 2019-09-18 MED ORDER — PANTOPRAZOLE SODIUM 40 MG PO TBEC: 40.0000 mg | DELAYED_RELEASE_TABLET | Freq: Every day | ORAL | 1 refills | Status: DC

## 2019-09-18 MED ORDER — LEVOTHYROXINE SODIUM 75 MCG PO TABS: 75.0000 ug | ORAL_TABLET | Freq: Every day | ORAL | 11 refills | Status: DC

## 2019-09-18 NOTE — Patient Instructions (Signed)
Hypothyroidism  Hypothyroidism is when the thyroid gland does not make enough of certain hormones (it is underactive). The thyroid gland is a small gland located in the lower front part of the neck, just in front of the windpipe (trachea). This gland makes hormones that help control how the body uses food for energy (metabolism) as well as how the heart and brain function. These hormones also play a role in keeping your bones strong. When the thyroid is underactive, it produces too little of the hormones thyroxine (T4) and triiodothyronine (T3). What are the causes? This condition may be caused by:  Hashimoto's disease. This is a disease in which the body's disease-fighting system (immune system) attacks the thyroid gland. This is the most common cause.  Viral infections.  Pregnancy.  Certain medicines.  Birth defects.  Past radiation treatments to the head or neck for cancer.  Past treatment with radioactive iodine.  Past exposure to radiation in the environment.  Past surgical removal of part or all of the thyroid.  Problems with a gland in the center of the brain (pituitary gland).  Lack of enough iodine in the diet. What increases the risk? You are more likely to develop this condition if:  You are female.  You have a family history of thyroid conditions.  You use a medicine called lithium.  You take medicines that affect the immune system (immunosuppressants). What are the signs or symptoms? Symptoms of this condition include:  Feeling as though you have no energy (lethargy).  Not being able to tolerate cold.  Weight gain that is not explained by a change in diet or exercise habits.  Lack of appetite.  Dry skin.  Coarse hair.  Menstrual irregularity.  Slowing of thought processes.  Constipation.  Sadness or depression. How is this diagnosed? This condition may be diagnosed based on:  Your symptoms, your medical history, and a physical exam.  Blood  tests. You may also have imaging tests, such as an ultrasound or MRI. How is this treated? This condition is treated with medicine that replaces the thyroid hormones that your body does not make. After you begin treatment, it may take several weeks for symptoms to go away. Follow these instructions at home:  Take over-the-counter and prescription medicines only as told by your health care provider.  If you start taking any new medicines, tell your health care provider.  Keep all follow-up visits as told by your health care provider. This is important. ? As your condition improves, your dosage of thyroid hormone medicine may change. ? You will need to have blood tests regularly so that your health care provider can monitor your condition. Contact a health care provider if:  Your symptoms do not get better with treatment.  You are taking thyroid replacement medicine and you: ? Sweat a lot. ? Have tremors. ? Feel anxious. ? Lose weight rapidly. ? Cannot tolerate heat. ? Have emotional swings. ? Have diarrhea. ? Feel weak. Get help right away if you have:  Chest pain.  An irregular heartbeat.  A rapid heartbeat.  Difficulty breathing. Summary  Hypothyroidism is when the thyroid gland does not make enough of certain hormones (it is underactive).  When the thyroid is underactive, it produces too little of the hormones thyroxine (T4) and triiodothyronine (T3).  The most common cause is Hashimoto's disease, a disease in which the body's disease-fighting system (immune system) attacks the thyroid gland. The condition can also be caused by viral infections, medicine, pregnancy, or past   radiation treatment to the head or neck.  Symptoms may include weight gain, dry skin, constipation, feeling as though you do not have energy, and not being able to tolerate cold.  This condition is treated with medicine to replace the thyroid hormones that your body does not make. This information  is not intended to replace advice given to you by your health care provider. Make sure you discuss any questions you have with your health care provider. Document Revised: 02/17/2017 Document Reviewed: 02/15/2017 Elsevier Patient Education  2020 [employer]  

## 2019-09-18 NOTE — Progress Notes (Signed)
Subjective:    Patient ID: Autumn Moyer, female    DOB: Aug 05, 1940, 79 y.o.   MRN: [id number]   Chief Complaint  Patient presents with  . Medical Management of Chronic Issues    started new thyroid meds   . Elbow Pain    right   Pt presents to the office today for chronic follow up. She is followed by Cardiologists annually for Aortic stenosis and gets an Echo each year. Hypertension This is a chronic problem. The current episode started more than 1 year ago. The problem has been resolved since onset. The problem is controlled. Associated symptoms include malaise/fatigue and shortness of breath (with climbing steps). Pertinent negatives include no peripheral edema. Risk factors for coronary artery disease include obesity, sedentary lifestyle and dyslipidemia. The current treatment provides moderate improvement. Hypertensive end-organ damage includes CAD/MI. Identifiable causes of hypertension include a thyroid problem.  Gastroesophageal Reflux She complains of belching and heartburn. This is a chronic problem. The current episode started more than 1 year ago. The problem occurs occasionally. The problem has been waxing and waning. The heartburn is of moderate intensity. The symptoms are aggravated by certain foods. Associated symptoms include fatigue. Risk factors include obesity. The treatment provided moderate relief.  Thyroid Problem Presents for follow-up visit. Symptoms include anxiety, fatigue and hair loss. The symptoms have been stable.  Anemia Presents for follow-up visit. Symptoms include malaise/fatigue.      Review of Systems  Constitutional: Positive for fatigue and malaise/fatigue.  Respiratory: Positive for shortness of breath (with climbing steps).   Gastrointestinal: Positive for heartburn.  Psychiatric/Behavioral: The patient is nervous/anxious.   All other systems reviewed and are negative.      Objective:   Physical Exam Vitals reviewed.  Constitutional:       General: She is not in acute distress.    Appearance: She is well-developed.  HENT:     Head: Normocephalic and atraumatic.     Right Ear: Tympanic membrane normal.     Left Ear: Tympanic membrane normal.  Eyes:     Pupils: Pupils are equal, round, and reactive to light.  Neck:     Thyroid: No thyromegaly.  Cardiovascular:     Rate and Rhythm: Normal rate and regular rhythm.     Heart sounds: Normal heart sounds. No murmur heard.   Pulmonary:     Effort: Pulmonary effort is normal. No respiratory distress.     Breath sounds: Normal breath sounds. No wheezing.  Abdominal:     General: Bowel sounds are normal. There is no distension.     Palpations: Abdomen is soft.     Tenderness: There is no abdominal tenderness.  Musculoskeletal:        General: No tenderness. Normal range of motion.     Cervical back: Normal range of motion and neck supple.  Skin:    General: Skin is warm and dry.  Neurological:     Mental Status: She is alert and oriented to person, place, and time.     Cranial Nerves: No cranial nerve deficit.     Deep Tendon Reflexes: Reflexes are normal and symmetric.  Psychiatric:        Behavior: Behavior normal.        Thought Content: Thought content normal.        Judgment: Judgment normal.       BP 111/61   Pulse 84   Temp (!) 96.8 F (36 C) (Temporal)   Ht _0  (1.626  m)   Wt 174 lb 9.6 oz (79.2 kg)   SpO2 94%   BMI 29.97 kg/m      Assessment & Plan:  CHARLITA BRIAN comes in today with chief complaint of Medical Management of Chronic Issues (started new thyroid meds ) and Elbow Pain (right)   Diagnosis and orders addressed:  1. Gastroesophageal reflux disease - pantoprazole (PROTONIX) 40 MG tablet; Take 1 tablet (40 mg total) by mouth daily.  Dispense: 90 tablet; Refill: 1 - CMP14+EGFR - CBC with Differential/Platelet  2. Essential hypertension - benazepril-hydrochlorthiazide (LOTENSIN HCT) 20-25 MG tablet; Take 1 tablet by mouth daily.   Dispense: 90 tablet; Refill: 1 - CMP14+EGFR - CBC with Differential/Platelet  3. Aortic valve stenosis, etiology of cardiac valve disease unspecified - CMP14+EGFR - CBC with Differential/Platelet  4. Essential hypertension, benign - CMP14+EGFR - CBC with Differential/Platelet  5. Need for hepatitis C screening test - CMP14+EGFR - CBC with Differential/Platelet - Hepatitis C antibody  6. Gastroesophageal reflux disease with esophagitis, unspecified whether hemorrhage - CMP14+EGFR - CBC with Differential/Platelet  7. Hypothyroidism, unspecified type - CMP14+EGFR - CBC with Differential/Platelet - TSH  8. Iron deficiency anemia, unspecified iron deficiency anemia type - CMP14+EGFR - CBC with Differential/Platelet  9. Obesity (BMI 30.0-34.9) - CMP14+EGFR - CBC with Differential/Platelet   Labs pending Health Maintenance reviewed Diet and exercise encouraged  Follow up plan: 4 month    Evelina Dun, FNP

## 2019-09-19 LAB — CMP14+EGFR
ALT: 17 IU/L (ref 0–32)
AST: 20 IU/L (ref 0–40)
Albumin/Globulin Ratio: 1.7 (ref 1.2–2.2)
Albumin: 4.5 g/dL (ref 3.7–4.7)
Alkaline Phosphatase: 59 IU/L (ref 48–121)
BUN/Creatinine Ratio: 16 (ref 12–28)
BUN: 17 mg/dL (ref 8–27)
Bilirubin Total: 1.2 mg/dL (ref 0.0–1.2)
CO2: 23 mmol/L (ref 20–29)
Calcium: 9.9 mg/dL (ref 8.7–10.3)
Chloride: 103 mmol/L (ref 96–106)
Creatinine, Ser: 1.08 mg/dL — ABNORMAL HIGH (ref 0.57–1.00)
GFR calc Af Amer: 58 mL/min/{1.73_m2} — ABNORMAL LOW (ref >59)
GFR calc non Af Amer: 50 mL/min/{1.73_m2} — ABNORMAL LOW (ref >59)
Globulin, Total: 2.7 g/dL (ref 1.5–4.5)
Glucose: 118 mg/dL — ABNORMAL HIGH (ref 65–99)
Potassium: 4.2 mmol/L (ref 3.5–5.2)
Sodium: 141 mmol/L (ref 134–144)
Total Protein: 7.2 g/dL (ref 6.0–8.5)

## 2019-09-19 LAB — CBC WITH DIFFERENTIAL/PLATELET
Basophils Absolute: 0.1 10*3/uL (ref 0.0–0.2)
Basos: 1 %
EOS (ABSOLUTE): 0.2 10*3/uL (ref 0.0–0.4)
Eos: 2 %
Hematocrit: 45.5 % (ref 34.0–46.6)
Hemoglobin: 15.2 g/dL (ref 11.1–15.9)
Immature Grans (Abs): 0 10*3/uL (ref 0.0–0.1)
Immature Granulocytes: 1 %
Lymphocytes Absolute: 2.1 10*3/uL (ref 0.7–3.1)
Lymphs: 27 %
MCH: 29.7 pg (ref 26.6–33.0)
MCHC: 33.4 g/dL (ref 31.5–35.7)
MCV: 89 fL (ref 79–97)
Monocytes Absolute: 0.8 10*3/uL (ref 0.1–0.9)
Monocytes: 10 %
Neutrophils Absolute: 4.6 10*3/uL (ref 1.4–7.0)
Neutrophils: 59 %
Platelets: 314 10*3/uL (ref 150–450)
RBC: 5.11 x10E6/uL (ref 3.77–5.28)
RDW: 12.8 % (ref 11.7–15.4)
WBC: 7.8 10*3/uL (ref 3.4–10.8)

## 2019-09-19 LAB — TSH: TSH: 4.3 u[IU]/mL (ref 0.450–4.500)

## 2019-09-19 LAB — HEPATITIS C ANTIBODY: Hep C Virus Ab: <0.1 s/co ratio (ref 0.0–0.9)

## 2019-09-24 ENCOUNTER — Encounter: Payer: Self-pay | Admitting: *Deleted

## 2019-09-25 ENCOUNTER — Ambulatory Visit: Payer: PPO | Admitting: Physician Assistant

## 2019-09-25 ENCOUNTER — Other Ambulatory Visit: Payer: Self-pay

## 2019-09-25 ENCOUNTER — Encounter: Payer: Self-pay | Admitting: Physician Assistant

## 2019-09-25 DIAGNOSIS — L570 Actinic keratosis: Secondary | ICD-10-CM | POA: Diagnosis not present

## 2019-09-25 DIAGNOSIS — L820 Inflamed seborrheic keratosis: Secondary | ICD-10-CM | POA: Diagnosis not present

## 2019-09-25 DIAGNOSIS — Z1283 Encounter for screening for malignant neoplasm of skin: Secondary | ICD-10-CM

## 2019-09-25 DIAGNOSIS — L821 Other seborrheic keratosis: Secondary | ICD-10-CM | POA: Diagnosis not present

## 2019-09-25 DIAGNOSIS — Z86007 Personal history of in-situ neoplasm of skin: Secondary | ICD-10-CM

## 2019-09-25 DIAGNOSIS — D485 Neoplasm of uncertain behavior of skin: Secondary | ICD-10-CM | POA: Diagnosis not present

## 2019-09-25 NOTE — Patient Instructions (Signed)

## 2019-09-25 NOTE — Progress Notes (Addendum)
Follow up Visit  Subjective  Autumn Moyer is a 79 y.o. female who presents for the following: Annual Exam (Patient here today for skin check.  Per patient she has a few places she would like looked at. Place on left knee, right leg, head, back, behind left ear, and upper lip left corner.  Per patient none of the spots are bleeing and she's unsure of how long the spots have been there.). Spot on left knee she is unsure length of time. She feels like it has gotten thicker and darker. Right lower leg crusty lesion. Vida Roller has talked about PDT for face.   Objective  Well appearing patient in no apparent distress; mood and affect are within normal limits.  A full examination was performed including scalp, head, eyes, ears, nose, lips, neck, chest, axillae, abdomen, back, buttocks, bilateral upper extremities, bilateral lower extremities, hands, feet, fingers, toes, fingernails, and toenails. All findings within normal limits unless otherwise noted below. No suspicious moles noted on back.   Objective  Left Forearm - Posterior, Left Malar Cheek, Left Upper Arm - Posterior (2), Mid Parietal Scalp (2), Mid Supratip of Nose (3), Right Breast (4), Right Dorsum of Foot, Right Forearm - Posterior, Right Lower Leg - Anterior, Right Malar Cheek, Right Upper Arm - Posterior (2): Erythematous patches with gritty scale.  Objective  Left Lower Leg - Anterior: Crusty lesion with irregular color     Objective  Right Forearm - Posterior: Dark brown macule with irregular shape and color.      Objective  Left Forearm - Posterior, Right Popliteal Fossa: Left forearm 2015 R posterior knee 2019  Objective  Right Suprapubic Area: Total body skin exam  Assessment & Plan  AK (actinic keratosis) (19) Left Upper Arm - Posterior (2); Right Upper Arm - Posterior (2); Left Forearm - Posterior; Right Forearm - Posterior; Right Lower Leg - Anterior; Right Breast (4); Right Dorsum of Foot; Mid Parietal  Scalp (2); Mid Supratip of Nose (3); Left Malar Cheek; Right Malar Cheek  Destruction of lesion - Left Forearm - Posterior, Left Malar Cheek, Left Upper Arm - Posterior, Mid Parietal Scalp, Mid Supratip of Nose, Right Breast, Right Dorsum of Foot, Right Forearm - Posterior, Right Lower Leg - Anterior, Right Malar Cheek, Right Upper Arm - Posterior Complexity: simple   Destruction method: cryotherapy   Informed consent: discussed and consent obtained   Timeout:  patient name, date of birth, surgical site, and procedure verified Lesion destroyed using liquid nitrogen: Yes   Outcome: patient tolerated procedure well with no complications    Neoplasm of uncertain behavior of skin (2) Left Lower Leg - Anterior  Skin / nail biopsy Type of biopsy: tangential   Informed consent: discussed and consent obtained   Timeout: patient name, date of birth, surgical site, and procedure verified   Procedure prep:  Patient was prepped and draped in usual sterile fashion (Non sterile) Prep type:  Chlorhexidine Anesthesia: the lesion was anesthetized in a standard fashion   Anesthetic:  1% lidocaine w/ epinephrine 1-100,000 local infiltration Instrument used: flexible razor blade   Outcome: patient tolerated procedure well   Post-procedure details: wound care instructions given    Specimen 1 - Surgical pathology Differential Diagnosis: BCC SCC  Check Margins: No  Right Forearm - Posterior  Skin / nail biopsy Type of biopsy: tangential   Informed consent: discussed and consent obtained   Timeout: patient name, date of birth, surgical site, and procedure verified   Procedure prep:  Patient was prepped and draped in usual sterile fashion (Non sterile) Prep type:  Chlorhexidine Anesthesia: the lesion was anesthetized in a standard fashion   Anesthetic:  1% lidocaine w/ epinephrine 1-100,000 local infiltration Instrument used: flexible razor blade   Outcome: patient tolerated procedure well     Post-procedure details: wound care instructions given    Specimen 2 - Surgical pathology Differential Diagnosis: BCC SCC Check Margins: No  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

## 2019-10-03 ENCOUNTER — Telehealth: Payer: Self-pay | Admitting: Family

## 2019-10-03 NOTE — Telephone Encounter (Signed)
Pt aware of all results and voiced understanding.

## 2019-11-04 ENCOUNTER — Other Ambulatory Visit: Payer: Self-pay | Admitting: Family

## 2019-11-04 DIAGNOSIS — J309 Allergic rhinitis, unspecified: Secondary | ICD-10-CM

## 2019-11-13 ENCOUNTER — Ambulatory Visit: Payer: PPO | Admitting: Family

## 2019-11-26 ENCOUNTER — Other Ambulatory Visit: Payer: Self-pay

## 2019-11-26 ENCOUNTER — Ambulatory Visit
Admission: RE | Admit: 2019-11-26 | Discharge: 2019-11-26 | Disposition: A | Discharge status: Home / Self Care | Payer: PPO | Source: Ambulatory Visit | Attending: Family | Admitting: Family

## 2019-11-26 DIAGNOSIS — Z1231 Encounter for screening mammogram for malignant neoplasm of breast: Secondary | ICD-10-CM | POA: Diagnosis not present

## 2019-12-23 ENCOUNTER — Other Ambulatory Visit: Payer: Self-pay

## 2019-12-23 ENCOUNTER — Ambulatory Visit (INDEPENDENT_AMBULATORY_CARE_PROVIDER_SITE_OTHER): Payer: PPO

## 2019-12-23 DIAGNOSIS — D3131 Benign neoplasm of right choroid: Secondary | ICD-10-CM | POA: Diagnosis not present

## 2019-12-23 DIAGNOSIS — Z23 Encounter for immunization: Secondary | ICD-10-CM | POA: Diagnosis not present

## 2019-12-23 DIAGNOSIS — H43813 Vitreous degeneration, bilateral: Secondary | ICD-10-CM | POA: Diagnosis not present

## 2019-12-23 DIAGNOSIS — Z961 Presence of intraocular lens: Secondary | ICD-10-CM | POA: Diagnosis not present

## 2019-12-23 DIAGNOSIS — H52201 Unspecified astigmatism, right eye: Secondary | ICD-10-CM | POA: Diagnosis not present

## 2020-01-17 ENCOUNTER — Ambulatory Visit (INDEPENDENT_AMBULATORY_CARE_PROVIDER_SITE_OTHER): Payer: PPO | Admitting: Family

## 2020-01-17 ENCOUNTER — Encounter: Payer: Self-pay | Admitting: Family

## 2020-01-17 ENCOUNTER — Other Ambulatory Visit: Payer: Self-pay

## 2020-01-17 VITALS — BP 135/75 | HR 85 | Temp 96.0°F | Ht 64.0 in | Wt 175.6 lb

## 2020-01-17 DIAGNOSIS — I10 Essential (primary) hypertension: Secondary | ICD-10-CM | POA: Diagnosis not present

## 2020-01-17 DIAGNOSIS — E039 Hypothyroidism, unspecified: Secondary | ICD-10-CM | POA: Diagnosis not present

## 2020-01-17 DIAGNOSIS — I4891 Unspecified atrial fibrillation: Secondary | ICD-10-CM | POA: Diagnosis not present

## 2020-01-17 DIAGNOSIS — E669 Obesity, unspecified: Secondary | ICD-10-CM | POA: Diagnosis not present

## 2020-01-17 DIAGNOSIS — I350 Nonrheumatic aortic (valve) stenosis: Secondary | ICD-10-CM | POA: Diagnosis not present

## 2020-01-17 DIAGNOSIS — K219 Gastro-esophageal reflux disease without esophagitis: Secondary | ICD-10-CM | POA: Diagnosis not present

## 2020-01-17 DIAGNOSIS — K2100 Gastro-esophageal reflux disease with esophagitis, without bleeding: Secondary | ICD-10-CM

## 2020-01-17 DIAGNOSIS — I499 Cardiac arrhythmia, unspecified: Secondary | ICD-10-CM

## 2020-01-17 DIAGNOSIS — R0602 Shortness of breath: Secondary | ICD-10-CM | POA: Diagnosis not present

## 2020-01-17 DIAGNOSIS — D509 Iron deficiency anemia, unspecified: Secondary | ICD-10-CM

## 2020-01-17 DIAGNOSIS — E66811 Obesity, class 1: Secondary | ICD-10-CM

## 2020-01-17 MED ORDER — BENAZEPRIL-HYDROCHLOROTHIAZIDE 20-25 MG PO TABS: 1.0000 | ORAL_TABLET | Freq: Every day | ORAL | 1 refills | Status: DC

## 2020-01-17 MED ORDER — LEVOTHYROXINE SODIUM 75 MCG PO TABS: 75.0000 ug | ORAL_TABLET | Freq: Every day | ORAL | 11 refills | Status: DC

## 2020-01-17 MED ORDER — PANTOPRAZOLE SODIUM 40 MG PO TBEC: 40.0000 mg | DELAYED_RELEASE_TABLET | Freq: Every day | ORAL | 1 refills | Status: DC

## 2020-01-17 MED ORDER — METOPROLOL TARTRATE 25 MG PO TABS: 25.0000 mg | ORAL_TABLET | Freq: Two times a day (BID) | ORAL | 3 refills | Status: DC

## 2020-01-17 MED ORDER — APIXABAN 5 MG PO TABS: 5.0000 mg | ORAL_TABLET | Freq: Two times a day (BID) | ORAL | 3 refills | Status: DC

## 2020-01-17 NOTE — Progress Notes (Signed)
Subjective:    Patient ID: Autumn Moyer, female    DOB: [birth year], 79 y.o.   MRN: [id number]  Chief Complaint  Patient presents with  . Hypothyroidism    frozen spots in scalp    Pt presents to the office today for chronic follow up. She is followed by Cardiologists annually for Aortic stenosis and gets an Echo each year.  Pt states she continues to have SOB on exertion. She states this has been on going for years, but seems to be worse. She is requesting a referral to Pulmonologist.  Hypertension This is a chronic problem. The current episode started more than 1 year ago. The problem has been waxing and waning since onset. The problem is uncontrolled. Associated symptoms include shortness of breath. Pertinent negatives include no malaise/fatigue or peripheral edema. Risk factors for coronary artery disease include dyslipidemia, obesity and sedentary lifestyle. The current treatment provides moderate improvement. Identifiable causes of hypertension include a thyroid problem.  Thyroid Problem Presents for follow-up visit. Symptoms include heat intolerance. Patient reports no anxiety, cold intolerance, depressed mood or dry skin. The symptoms have been stable.  Gastroesophageal Reflux She complains of belching and heartburn. This is a chronic problem. The current episode started more than 1 year ago. The problem occurs occasionally. The problem has been waxing and waning. She has tried a PPI for the symptoms. The treatment provided moderate relief.      Review of Systems  Constitutional: Negative for malaise/fatigue.  Respiratory: Positive for shortness of breath.   Gastrointestinal: Positive for heartburn.  Endocrine: Positive for heat intolerance. Negative for cold intolerance.  Psychiatric/Behavioral: The patient is not nervous/anxious.   All other systems reviewed and are negative.  Family History  Problem Relation Age of Onset  . Hypertension Mother   . Hyperlipidemia Mother    . Heart failure Mother        Age 39  . Hyperlipidemia Father   . Hypertension Father   . Heart disease Father        died of MI after developoing cancer.  . Lung cancer Father   . Hypertension Sister   . Breast cancer Daughter   . Breast cancer Paternal Aunt    Social History   Socioeconomic History  . Marital status: Married    Spouse name: Not on file  . Number of children: 2  . Years of education: Not on file  . Highest education level: Not on file  Occupational History  . Not on file  Tobacco Use  . Smoking status: Never Smoker  . Smokeless tobacco: Never Used  Vaping Use  . Vaping Use: Never used  Substance and Sexual Activity  . Alcohol use: No    Alcohol/week: 0.0 standard drinks  . Drug use: No  . Sexual activity: Yes    Partners: Female  Other Topics Concern  . Not on file  Social History Narrative   Lives with husband.     Social Determinants of Health   Financial Resource Strain:   . Difficulty of Paying Living Expenses: Not on file  Food Insecurity:   . Worried About Charity fundraiser in the Last Year: Not on file  . Ran Out of Food in the Last Year: Not on file  Transportation Needs:   . Lack of Transportation (Medical): Not on file  . Lack of Transportation (Non-Medical): Not on file  Physical Activity:   . Days of Exercise per Week: Not on file  . Minutes of  Exercise per Session: Not on file  Stress:   . Feeling of Stress : Not on file  Social Connections:   . Frequency of Communication with Friends and Family: Not on file  . Frequency of Social Gatherings with Friends and Family: Not on file  . Attends Religious Services: Not on file  . Active Member of Clubs or Organizations: Not on file  . Attends Archivist Meetings: Not on file  . Marital Status: Not on file       Objective:   Physical Exam Vitals reviewed.  Constitutional:      General: She is not in acute distress.    Appearance: She is well-developed.  HENT:      Head: Normocephalic and atraumatic.     Right Ear: Tympanic membrane normal.     Left Ear: Tympanic membrane normal.  Eyes:     Pupils: Pupils are equal, round, and reactive to light.  Neck:     Thyroid: No thyromegaly.  Cardiovascular:     Rate and Rhythm: Normal rate. Rhythm irregular.     Heart sounds: Normal heart sounds. No murmur heard.   Pulmonary:     Effort: Pulmonary effort is normal. No respiratory distress.     Breath sounds: Normal breath sounds. No wheezing.  Abdominal:     General: Bowel sounds are normal. There is no distension.     Palpations: Abdomen is soft.     Tenderness: There is no abdominal tenderness.  Musculoskeletal:        General: No tenderness. Normal range of motion.     Cervical back: Normal range of motion and neck supple.  Skin:    General: Skin is warm and dry.  Neurological:     Mental Status: She is alert and oriented to person, place, and time.     Cranial Nerves: No cranial nerve deficit.     Deep Tendon Reflexes: Reflexes are normal and symmetric.  Psychiatric:        Behavior: Behavior normal.        Thought Content: Thought content normal.        Judgment: Judgment normal.    CHA2DS2-VASc Score = 6  The patient's score is based upon: CHF History: 0 HTN History: 1 Diabetes History: 1 Stroke History: 0 Vascular Disease History: 1 Age Score: 2 Gender Score: 1   This patients CHA2DS2-VASc Score and unadjusted Ischemic Stroke Rate (% per year) is equal to 9.7 % stroke rate/year from a score of 6  Above score calculated as 1 point each if present [CHF, HTN, DM, Vascular=MI/PAD/Aortic Plaque, Age if 65-74, or Female] Above score calculated as 2 points each if present [Age > 75, or Stroke/TIA/TE]     BP 135/75   Pulse 85   Temp (!) 96 F (35.6 C) (Temporal)   Ht 5' 4"  (1.626 m)   Wt 175 lb 9.6 oz (79.7 kg)   SpO2 96%   BMI 30.14 kg/m      Assessment & Plan:  Autumn Moyer comes in today with chief complaint of  Hypothyroidism (frozen spots in scalp )   Diagnosis and orders addressed:  1. Essential hypertension - benazepril-hydrochlorthiazide (LOTENSIN HCT) 20-25 MG tablet; Take 1 tablet by mouth daily.  Dispense: 90 tablet; Refill: 1 - CMP14+EGFR - CBC with Differential/Platelet  2. Gastroesophageal reflux disease - pantoprazole (PROTONIX) 40 MG tablet; Take 1 tablet (40 mg total) by mouth daily.  Dispense: 90 tablet; Refill: 1 - CMP14+EGFR - CBC with  Differential/Platelet  3. Essential hypertension, benign - CMP14+EGFR - CBC with Differential/Platelet  4. Aortic valve stenosis, etiology of cardiac valve disease unspecified - CMP14+EGFR - CBC with Differential/Platelet  5. Gastroesophageal reflux disease with esophagitis, unspecified whether hemorrhage - CMP14+EGFR - CBC with Differential/Platelet  6. Hypothyroidism, unspecified type - levothyroxine (SYNTHROID) 75 MCG tablet; Take 1 tablet (75 mcg total) by mouth daily.  Dispense: 30 tablet; Refill: 11 - CMP14+EGFR - CBC with Differential/Platelet - TSH  7. Iron deficiency anemia, unspecified iron deficiency anemia type - CMP14+EGFR - CBC with Differential/Platelet  8. Obesity (BMI 30.0-34.9) - CMP14+EGFR - CBC with Differential/Platelet  9. SOB (shortness of breath) on exertion - Ambulatory referral to Pulmonology - CMP14+EGFR - CBC with Differential/Platelet - EKG 12-Lead  10. Irregular heart rate - EKG 12-Lead  11. New onset atrial fibrillation (HCC) Stop aspirin and start Eliquis Start Metoprolol today Stat referral to see Cardiologists, pt is followed by Cardiologist s - apixaban (ELIQUIS) 5 MG TABS tablet; Take 1 tablet (5 mg total) by mouth 2 (two) times daily.  Dispense: 60 tablet; Refill: 3 - Ambulatory referral to Cardiology - metoprolol tartrate (LOPRESSOR) 25 MG tablet; Take 1 tablet (25 mg total) by mouth 2 (two) times daily.  Dispense: 180 tablet; Refill: 3   Labs pending Health Maintenance  reviewed Diet and exercise encouraged  Follow up plan: 2 weeks   40 mins spent with patient, approx 20 mins spent with patient counseling and diagnosing about new A fib.   Evelina Dun, FNP

## 2020-01-17 NOTE — Patient Instructions (Addendum)

## 2020-01-18 LAB — CBC WITH DIFFERENTIAL/PLATELET
Basophils Absolute: 0.1 10*3/uL (ref 0.0–0.2)
Basos: 2 %
EOS (ABSOLUTE): 0.3 10*3/uL (ref 0.0–0.4)
Eos: 4 %
Hematocrit: 45.3 % (ref 34.0–46.6)
Hemoglobin: 15.4 g/dL (ref 11.1–15.9)
Immature Grans (Abs): 0 10*3/uL (ref 0.0–0.1)
Immature Granulocytes: 0 %
Lymphocytes Absolute: 2.9 10*3/uL (ref 0.7–3.1)
Lymphs: 36 %
MCH: 30.5 pg (ref 26.6–33.0)
MCHC: 34 g/dL (ref 31.5–35.7)
MCV: 90 fL (ref 79–97)
Monocytes Absolute: 0.7 10*3/uL (ref 0.1–0.9)
Monocytes: 9 %
Neutrophils Absolute: 4 10*3/uL (ref 1.4–7.0)
Neutrophils: 49 %
Platelets: 358 10*3/uL (ref 150–450)
RBC: 5.05 x10E6/uL (ref 3.77–5.28)
RDW: 12.7 % (ref 11.7–15.4)
WBC: 8.1 10*3/uL (ref 3.4–10.8)

## 2020-01-18 LAB — CMP14+EGFR
ALT: 18 IU/L (ref 0–32)
AST: 21 IU/L (ref 0–40)
Albumin/Globulin Ratio: 1.7 (ref 1.2–2.2)
Albumin: 4.5 g/dL (ref 3.7–4.7)
Alkaline Phosphatase: 60 IU/L (ref 44–121)
BUN/Creatinine Ratio: 12 (ref 12–28)
BUN: 13 mg/dL (ref 8–27)
Bilirubin Total: 1.1 mg/dL (ref 0.0–1.2)
CO2: 23 mmol/L (ref 20–29)
Calcium: 10 mg/dL (ref 8.7–10.3)
Chloride: 102 mmol/L (ref 96–106)
Creatinine, Ser: 1.05 mg/dL — ABNORMAL HIGH (ref 0.57–1.00)
GFR calc Af Amer: 59 mL/min/{1.73_m2} — ABNORMAL LOW (ref >59)
GFR calc non Af Amer: 51 mL/min/{1.73_m2} — ABNORMAL LOW (ref >59)
Globulin, Total: 2.6 g/dL (ref 1.5–4.5)
Glucose: 116 mg/dL — ABNORMAL HIGH (ref 65–99)
Potassium: 4 mmol/L (ref 3.5–5.2)
Sodium: 141 mmol/L (ref 134–144)
Total Protein: 7.1 g/dL (ref 6.0–8.5)

## 2020-01-18 LAB — TSH: TSH: 10 u[IU]/mL — ABNORMAL HIGH (ref 0.450–4.500)

## 2020-01-20 ENCOUNTER — Other Ambulatory Visit: Payer: Self-pay | Admitting: Family

## 2020-01-20 MED ORDER — LEVOTHYROXINE SODIUM 88 MCG PO TABS: 88.0000 ug | ORAL_TABLET | Freq: Every day | ORAL | 11 refills | Status: DC

## 2020-02-10 ENCOUNTER — Ambulatory Visit (INDEPENDENT_AMBULATORY_CARE_PROVIDER_SITE_OTHER): Payer: PPO | Admitting: Family

## 2020-02-10 ENCOUNTER — Encounter: Payer: Self-pay | Admitting: Family

## 2020-02-10 ENCOUNTER — Other Ambulatory Visit: Payer: Self-pay

## 2020-02-10 VITALS — BP 117/71 | HR 98 | Temp 95.0°F | Ht 64.0 in | Wt 175.4 lb

## 2020-02-10 DIAGNOSIS — I4891 Unspecified atrial fibrillation: Secondary | ICD-10-CM

## 2020-02-10 DIAGNOSIS — I10 Essential (primary) hypertension: Secondary | ICD-10-CM

## 2020-02-10 DIAGNOSIS — R0602 Shortness of breath: Secondary | ICD-10-CM

## 2020-02-10 DIAGNOSIS — E039 Hypothyroidism, unspecified: Secondary | ICD-10-CM

## 2020-02-10 LAB — BMP8+EGFR
BUN/Creatinine Ratio: 15 (ref 12–28)
BUN: 16 mg/dL (ref 8–27)
CO2: 25 mmol/L (ref 20–29)
Calcium: 9.9 mg/dL (ref 8.7–10.3)
Chloride: 101 mmol/L (ref 96–106)
Creatinine, Ser: 1.1 mg/dL — ABNORMAL HIGH (ref 0.57–1.00)
GFR calc Af Amer: 56 mL/min/{1.73_m2} — ABNORMAL LOW (ref >59)
GFR calc non Af Amer: 49 mL/min/{1.73_m2} — ABNORMAL LOW (ref >59)
Glucose: 126 mg/dL — ABNORMAL HIGH (ref 65–99)
Potassium: 4.1 mmol/L (ref 3.5–5.2)
Sodium: 141 mmol/L (ref 134–144)

## 2020-02-10 MED ORDER — MOMETASONE FUROATE 0.1 % EX CREA: 1.0000 "application " | TOPICAL_CREAM | Freq: Every day | CUTANEOUS | 2 refills | Status: DC

## 2020-02-10 NOTE — Progress Notes (Signed)
   Subjective:    Patient ID: Autumn Moyer, female    DOB: May 31, 1940, 79 y.o.   MRN: [id number]  Chief Complaint  Patient presents with  . Atrial Moyer    HPI Pt presents to the office today to recheck A Fib. She was seen two weeks ago and diagnosed with A Fib. She was started on Eliqus 5 mg BID  metoprolol 25 mg BID. She reports she continues to feel SOB. She has her Cardiologists appt 03/02/20. Her thyroid was abnormal and we increased her levothyroxine to 88 mcg from 75 mcg.    Review of Systems  Respiratory: Positive for shortness of breath.   All other systems reviewed and are negative.      Objective:   Physical Exam Vitals reviewed.  Constitutional:      General: She is not in acute distress.    Appearance: She is well-developed.  HENT:     Head: Normocephalic and atraumatic.     Right Ear: Tympanic membrane normal.     Left Ear: Tympanic membrane normal.  Eyes:     Pupils: Pupils are equal, round, and reactive to light.  Neck:     Thyroid: No thyromegaly.  Cardiovascular:     Rate and Rhythm: Normal rate and regular rhythm.     Heart sounds: Normal heart sounds. No murmur heard.   Pulmonary:     Effort: Pulmonary effort is normal. No respiratory distress.     Breath sounds: Normal breath sounds. No wheezing.  Abdominal:     General: Bowel sounds are normal. There is no distension.     Palpations: Abdomen is soft.     Tenderness: There is no abdominal tenderness.  Musculoskeletal:        General: No tenderness. Normal range of motion.     Cervical back: Normal range of motion and neck supple.  Skin:    General: Skin is warm and dry.  Neurological:     Mental Status: She is alert and oriented to person, place, and time.     Cranial Nerves: No cranial nerve deficit.     Deep Tendon Reflexes: Reflexes are normal and symmetric.  Psychiatric:        Behavior: Behavior normal.        Thought Content: Thought content normal.        Judgment: Judgment  normal.       BP 117/71   Pulse 98   Temp (!) 95 F (35 C) (Temporal)   Ht _0  (1.626 m)   Wt 175 lb 6.4 oz (79.6 kg)   SpO2 97%   BMI 30.11 kg/m      Assessment & Plan:  Autumn Moyer   Diagnosis and orders addressed:  1. Essential hypertension, benign - BMP8+EGFR  2. Hypothyroidism, unspecified type - BMP8+EGFR  3. Atrial Moyer, unspecified type ([place]) Continue Eliquis and metoprolol Echo ordered to hopefully be completed before her follow up with Cardiologists  - BMP8+EGFR - ECHO COMPLETE (BACK OFFICE); Future  4. SOB (shortness of breath) on exertion - BMP8+EGFR - ECHO COMPLETE (BACK OFFICE); Future - Ambulatory referral to Pulmonology   Labs pending Health Maintenance reviewed Diet and exercise encouraged  Follow up plan: 2 months to recheck thyroid    Evelina Dun, FNP

## 2020-02-10 NOTE — Patient Instructions (Signed)

## 2020-02-17 ENCOUNTER — Telehealth: Payer: Self-pay | Admitting: Family

## 2020-02-29 NOTE — Progress Notes (Signed)
Cardiology Office Note   Date:  03/02/2020   ID:  Autumn Moyer, Autumn Moyer 16, 1942, MRN [id number]  PCP:  Sharion Balloon, FNP  Cardiologist:   Minus Breeding, MD   Chief Complaint  Patient presents with  . Atrial Fibrillation      History of Present Illness: Autumn Moyer is a 79 y.o. female who presents for evaluation of aortic stenosis and new onset atrial fibrillation.. Her AS was moderate in August in 2020.  Since I last saw her she presented to her primary provider in October 29.  She was not feeling particularly well but really could not quantify or qualify this.  She did not feel her heart skipping but she did feel somewhat clammy.  She was noted to be in atrial fibrillation.  There was somewhat rapid ventricular response.  She was started on Eliquis and taken off of aspirin.  She was started on a low-dose of beta-blocker.  Since that time she has had similar sensations but she would not know that she was in fibrillation.  She has not had any presyncope or syncope.  She does get chest heaviness when she is carrying something or doing any activity.  She might have some increased shortness of breath.  She denied having any resting shortness of breath, PND or orthopnea.  She is not having any palpitations, presyncope or syncope.  She had no weight gain or edema.   Past Medical History:  Diagnosis Date  . Atrial fibrillation ([place])   . Hypertension   . Squamous cell carcinoma of skin 10/23/2013   in situ- left forearm (CX35FU)  . Squamous cell carcinoma of skin 01/16/2018   in situ-right knee post (txpbx)  . Thyroid disease     Past Surgical History:  Procedure Laterality Date  . EYE SURGERY    . TOE SURGERY    . TUBAL LIGATION       Current Outpatient Medications  Medication Sig Dispense Refill  . acetaminophen (TYLENOL) 325 MG tablet Take 650 mg by mouth every 6 (six) hours as needed.    Marland Kitchen apixaban (ELIQUIS) 5 MG TABS tablet Take 1 tablet (5 mg total) by mouth 2  (two) times daily. 60 tablet 3  . benazepril-hydrochlorthiazide (LOTENSIN HCT) 20-25 MG tablet Take 1 tablet by mouth daily. 90 tablet 1  . levocetirizine (XYZAL) 5 MG tablet TAKE 1 TABLET BY MOUTH ONCE DAILY IN THE EVENING 90 tablet 1  . levothyroxine (SYNTHROID) 88 MCG tablet Take 1 tablet (88 mcg total) by mouth daily. 30 tablet 11  . Lysine 500 MG TABS Take by mouth 2 (two) times daily.     . metoprolol tartrate (LOPRESSOR) 25 MG tablet Take 1 tablet (25 mg total) by mouth 2 (two) times daily. 180 tablet 3  . mometasone (ELOCON) 0.1 % cream Apply 1 application topically daily. 45 g 2  . Omega-3 Fatty Acids (FISH OIL) 500 MG CAPS Take 1,000 mg by mouth daily.    . pantoprazole (PROTONIX) 40 MG tablet Take 1 tablet (40 mg total) by mouth daily. 90 tablet 1  . Multiple Vitamin (MULTIVITAMIN) capsule Take 1 capsule by mouth daily.     No current facility-administered medications for this visit.    Allergies:   Noroxin [norfloxacin]    ROS:  Please see the history of present illness.   Otherwise, review of systems are positive for none.   All other systems are reviewed and negative.    PHYSICAL EXAM: VS:  BP  128/80 (BP Location: Left Arm, Patient Position: Sitting)   Pulse (!) 58   Resp 20   Ht 5\' 4"  (1.626 m)   Wt 176 lb 9.6 oz (80.1 kg)   SpO2 96%   BMI 30.31 kg/m  , BMI Body mass index is 30.31 kg/m. GENERAL:  Well appearing NECK:  No jugular venous distention, waveform within normal limits, carotid upstroke brisk and symmetric, no bruits, no thyromegaly LUNGS:  Clear to auscultation bilaterally CHEST:  Unremarkable HEART:  PMI not displaced or sustained,S1 and S2 within normal limits, no S3, no S4, no clicks, no rubs, 3 out of 6 apical systolic murmur radiating out aortic outflow tract and mid peaking, no diastolic murmurs ABD:  Flat, positive bowel sounds normal in frequency in pitch, no bruits, no rebound, no guarding, no midline pulsatile mass, no hepatomegaly, no  splenomegaly EXT:  2 plus pulses throughout, no edema, no cyanosis no clubbing    EKG:  EKG is  ordered today. Normal sinus rhythm, rate 59, axis within normal limits, intervals within normal limits, no acute ST-T wave changes.  Recent Labs: 01/17/2020: ALT 18; Hemoglobin 15.4; Platelets 358; TSH 10.000 02/10/2020: BUN 16; Creatinine, Ser 1.10; Potassium 4.1; Sodium 141    Lipid Panel No results found for: CHOL, TRIG, HDL, CHOLHDL, VLDL, LDLCALC, LDLDIRECT    Wt Readings from Last 3 Encounters:  03/02/20 176 lb 9.6 oz (80.1 kg)  02/10/20 175 lb 6.4 oz (79.6 kg)  01/17/20 175 lb 9.6 oz (79.7 kg)      Other studies Reviewed: Additional studies/ records that were reviewed today include: Primary care office records and EKG Review of the above records demonstrates:    See elsewhere   ASSESSMENT AND PLAN:  AS: She is having some chest discomfort and shortness of breath.  I am going to check an echocardiogram to see if there is been progression.  If this looks to be progressed she will need a right and left heart cath.  Otherwise I will probably suggest noninvasive study to evaluate that shortness of breath and pain.   HTN: Her blood pressure is controlled.  She will continue the meds as listed.   CHEST PAIN: This will be evaluated as above  ATRIAL FIB: She has new onset paroxysmal atrial fibrillation.  I am going to apply a 2-week monitor to see if this is more frequent and we can tell.  If we can associated with symptoms she might need antiarrhythmic therapy.  She tolerates anticoagulation.  [name] has a CHA2DS2 - VASc score of 4.    COVID EDUCATION:   She would want to get the vaccine when available.   Current medicines are reviewed at length with the patient today.  The patient does not have concerns regarding medicines.  The following changes have been made:   None  Disposition:   Follow up with me in two years.   Signed, Minus Breeding, MD  03/02/2020  4:14 PM    [facility]

## 2020-03-02 ENCOUNTER — Encounter: Payer: Self-pay | Admitting: Cardiology

## 2020-03-02 ENCOUNTER — Ambulatory Visit (INDEPENDENT_AMBULATORY_CARE_PROVIDER_SITE_OTHER): Payer: PPO | Admitting: Cardiology

## 2020-03-02 ENCOUNTER — Encounter: Payer: Self-pay | Admitting: *Deleted

## 2020-03-02 ENCOUNTER — Other Ambulatory Visit: Payer: Self-pay

## 2020-03-02 VITALS — BP 128/80 | HR 58 | Resp 20 | Ht 64.0 in | Wt 176.6 lb

## 2020-03-02 DIAGNOSIS — I10 Essential (primary) hypertension: Secondary | ICD-10-CM | POA: Diagnosis not present

## 2020-03-02 DIAGNOSIS — I4891 Unspecified atrial fibrillation: Secondary | ICD-10-CM | POA: Diagnosis not present

## 2020-03-02 DIAGNOSIS — I350 Nonrheumatic aortic (valve) stenosis: Secondary | ICD-10-CM

## 2020-03-02 DIAGNOSIS — R002 Palpitations: Secondary | ICD-10-CM | POA: Diagnosis not present

## 2020-03-02 NOTE — Patient Instructions (Signed)
Medication Instructions:  No changes *If you need a refill on your cardiac medications before your next appointment, please call your pharmacy*  Lab Work: None ordered this visit  Testing/Procedures: 14 day heart monitor - call [phone] if needing help putting monitor on.  Your physician has requested that you have an echocardiogram. Echocardiography is a painless test that uses sound waves to create images of your heart. It provides your doctor with information about the size and shape of your heart and how well your heart's chambers and valves are working. This procedure takes approximately one hour. There are no restrictions for this procedure. [address]  Follow-Up: At [facility], you and your health needs are our priority.  As part of our continuing mission to provide you with exceptional heart care, we have created designated Provider Care Teams.  These Care Teams include your primary Cardiologist (physician) and Advanced Practice Providers (APPs -  Physician Assistants and Nurse Practitioners) who all work together to provide you with the care you need, when you need it.   Your next appointment:   In [place] after Echo and heart monitor  The format for your next appointment:   In Person  Provider:   Dr. Minus Breeding  Other Instructions ZIO XT- Long Term Monitor Instructions   Your physician has requested you wear your ZIO patch monitor_14_days.   This is a single patch monitor.  [employer] supplies one patch monitor per enrollment.  Additional stickers are not available.   Please do not apply patch if you will be having a Nuclear Stress Test, Echocardiogram, Cardiac CT, MRI, or Chest Xray during the time frame you would be wearing the monitor. The patch cannot be worn during these tests.  You cannot remove and re-apply the ZIO XT patch monitor.   Your ZIO patch monitor will be sent USPS Priority mail from [facility] directly to your  home address. The monitor may also be mailed to a PO BOX if home delivery is not available.   It may take 3-5 days to receive your monitor after you have been enrolled.   Once you have received you monitor, please review enclosed instructions.  Your monitor has already been registered assigning a specific monitor serial # to you.   Applying the monitor   Shave hair from upper left chest.   Hold abrader disc by orange tab.  Rub abrader in 40 strokes over left upper chest as indicated in your monitor instructions.   Clean area with 4 enclosed alcohol pads .  Use all pads to assure are is cleaned thoroughly.  Let dry.   Apply patch as indicated in monitor instructions.  Patch will be place under collarbone on left side of chest with arrow pointing upward.   Rub patch adhesive wings for 2 minutes.Remove white label marked "1".  Remove white label marked "2".  Rub patch adhesive wings for 2 additional minutes.   While looking in a mirror, press and release button in center of patch.  A small green light will flash 3-4 times .  This will be your only indicator the monitor has been turned on.     Do not shower for the first 24 hours.  You may shower after the first 24 hours.   Press button if you feel a symptom. You will hear a small click.  Record Date, Time and Symptom in the Patient Log Book.   When you are ready to remove patch, follow instructions on  last 2 pages of Patient Log Book.  Stick patch monitor onto last page of Patient Log Book.   Place Patient Log Book in [place] box.  Use locking tab on box and tape box closed securely.  The [employer] has [employer] on it.  Please place in mailbox as soon as possible.  Your physician should have your test results approximately 7 days after the monitor has been mailed back to [facility].   Call [facility] at [phone] if you have questions regarding your ZIO XT patch monitor.  Call them immediately if you see  an orange light blinking on your monitor.   If your monitor falls off in less than 4 days contact our Monitor department at [phone].  If your monitor becomes loose or falls off after 4 days call [employer] at [phone] for suggestions on securing your monitor.

## 2020-03-02 NOTE — Progress Notes (Signed)
Patient ID: Autumn Moyer, female   DOB: 1940-12-02, 79 y.o.   MRN: [id number] Patient enrolled for [employer] to ship a 14 day ZIO XT long term holter monitor to her home.

## 2020-03-04 ENCOUNTER — Ambulatory Visit (INDEPENDENT_AMBULATORY_CARE_PROVIDER_SITE_OTHER): Payer: PPO

## 2020-03-04 DIAGNOSIS — R002 Palpitations: Secondary | ICD-10-CM | POA: Diagnosis not present

## 2020-03-04 DIAGNOSIS — I4891 Unspecified atrial fibrillation: Secondary | ICD-10-CM | POA: Diagnosis not present

## 2020-03-04 NOTE — Addendum Note (Signed)
Addended by: Jacqulynn Cadet on: 03/04/2020 02:19 PM   Modules accepted: Orders

## 2020-03-09 ENCOUNTER — Encounter: Payer: Self-pay | Admitting: Dermatology

## 2020-03-09 ENCOUNTER — Ambulatory Visit: Payer: PPO | Admitting: Dermatology

## 2020-03-09 ENCOUNTER — Other Ambulatory Visit: Payer: Self-pay

## 2020-03-09 DIAGNOSIS — L309 Dermatitis, unspecified: Secondary | ICD-10-CM | POA: Diagnosis not present

## 2020-03-09 MED ORDER — TACROLIMUS 0.1 % EX OINT: TOPICAL_OINTMENT | Freq: Two times a day (BID) | CUTANEOUS | 4 refills | Status: DC

## 2020-03-11 ENCOUNTER — Encounter: Payer: Self-pay | Admitting: Dermatology

## 2020-03-11 ENCOUNTER — Telehealth: Payer: Self-pay | Admitting: Dermatology

## 2020-03-11 DIAGNOSIS — L309 Dermatitis, unspecified: Secondary | ICD-10-CM

## 2020-03-11 NOTE — Progress Notes (Signed)
   Follow-Up Visit   Subjective  Autumn Moyer is a 79 y.o. female who presents for the following: Skin Problem (Eczema-  red & irritated- itches & burns tx- visine & vasoline).  Rash Location: Eyelids Duration: Few months Quality:  Associated Signs/Symptoms: Itch sting Modifying Factors: Vaseline and Visine Severity:  Timing: Context:   Objective  Well appearing patient in no apparent distress; mood and affect are within normal limits. Objective  Left Upper Eyelid: Subtle erythema mild edema upper eyelids, left greater than right with similar changes distal ear canal right greater than left.  This is more likely a variant of seborrheic dermatitis contact allergy but will consider patch testing if unresponsive to topical therapy.  Review of systems stable.   A focused examination was performed including Head and neck.. Relevant physical exam findings are noted in the Assessment and Plan.   Assessment & Plan    Eczema, unspecified type Left Upper Eyelid  Use protopic 0.1% ointment qd for 3 weeks-follow-up by phone or MyChart at that time.  Ordered Medications: tacrolimus (PROTOPIC) 0.1 % ointment     I, Lavonna Monarch, MD, have reviewed all documentation for this visit.  The documentation on 03/11/20 for the exam, diagnosis, procedures, and orders are all accurate and complete.

## 2020-03-11 NOTE — Telephone Encounter (Signed)
Patient went to pick up Tacrolimus 0.1% and for 3 tubes it was $80.00.  Patient would like prescription changed to 1 tube (cost will be $40.00).  Patient says she can't afford the $80, but is okay with $40.

## 2020-03-12 MED ORDER — TACROLIMUS 0.1 % EX OINT: TOPICAL_OINTMENT | Freq: Two times a day (BID) | CUTANEOUS | 4 refills | Status: DC

## 2020-03-12 NOTE — Telephone Encounter (Signed)
Sent in just 1 tube of the tacrolimus ointment for patient. Only 40$.

## 2020-03-12 NOTE — Addendum Note (Signed)
Addended by: Ailene Rud on: 03/12/2020 09:40 AM   Modules accepted: Orders

## 2020-03-16 ENCOUNTER — Ambulatory Visit: Payer: PPO | Admitting: Family

## 2020-03-27 ENCOUNTER — Telehealth: Payer: Self-pay | Admitting: Cardiology

## 2020-03-27 DIAGNOSIS — R002 Palpitations: Secondary | ICD-10-CM | POA: Diagnosis not present

## 2020-03-27 DIAGNOSIS — I4891 Unspecified atrial fibrillation: Secondary | ICD-10-CM | POA: Diagnosis not present

## 2020-03-27 NOTE — Telephone Encounter (Signed)
Christina with [employer] is calling to report abnormal Zio monitor results.   Phone#: [phone]

## 2020-03-27 NOTE — Telephone Encounter (Signed)
Called to follow up with pt after receiving alert from [employer] regarding 60-second episode of afib with peak HR of 185 on 03/14/20. Pt states she has not been feeling great, but was otherwise unaware of the episode as it occurred. She states she is taking her eliquis and metoprolol as prescribed, and plans to be at her upcoming echo and follow up visit with Dr. Percival Spanish as scheduled. Strip handed off to Dr. Percival Spanish for review. No new orders obtained.

## 2020-04-02 ENCOUNTER — Ambulatory Visit (HOSPITAL_COMMUNITY): Payer: PPO | Attending: Cardiology

## 2020-04-02 ENCOUNTER — Other Ambulatory Visit: Payer: Self-pay

## 2020-04-02 DIAGNOSIS — I350 Nonrheumatic aortic (valve) stenosis: Secondary | ICD-10-CM | POA: Insufficient documentation

## 2020-04-02 LAB — ECHOCARDIOGRAM COMPLETE
AR max vel: 1.19 cm2
AV Area VTI: 1.24 cm2
AV Area mean vel: 1.2 cm2
AV Mean grad: 26.8 mmHg
AV Peak grad: 47.8 mmHg
Ao pk vel: 3.46 m/s
Area-P 1/2: 3.91 cm2
P 1/2 time: 290 msec

## 2020-04-13 ENCOUNTER — Other Ambulatory Visit: Payer: Self-pay

## 2020-04-13 ENCOUNTER — Ambulatory Visit (INDEPENDENT_AMBULATORY_CARE_PROVIDER_SITE_OTHER): Payer: PPO | Admitting: Family

## 2020-04-13 ENCOUNTER — Encounter: Payer: Self-pay | Admitting: Family

## 2020-04-13 VITALS — BP 121/61 | HR 71 | Temp 97.6°F | Ht 64.0 in | Wt 176.6 lb

## 2020-04-13 DIAGNOSIS — I350 Nonrheumatic aortic (valve) stenosis: Secondary | ICD-10-CM | POA: Diagnosis not present

## 2020-04-13 DIAGNOSIS — I10 Essential (primary) hypertension: Secondary | ICD-10-CM

## 2020-04-13 DIAGNOSIS — D509 Iron deficiency anemia, unspecified: Secondary | ICD-10-CM

## 2020-04-13 DIAGNOSIS — E669 Obesity, unspecified: Secondary | ICD-10-CM | POA: Diagnosis not present

## 2020-04-13 DIAGNOSIS — E039 Hypothyroidism, unspecified: Secondary | ICD-10-CM | POA: Diagnosis not present

## 2020-04-13 MED ORDER — BENAZEPRIL-HYDROCHLOROTHIAZIDE 20-25 MG PO TABS: 1.0000 | ORAL_TABLET | Freq: Every day | ORAL | 1 refills | Status: DC

## 2020-04-13 MED ORDER — BENAZEPRIL-HYDROCHLOROTHIAZIDE 20-25 MG PO TABS
1.0000 | ORAL_TABLET | Freq: Every day | ORAL | 1 refills | Status: DC
Start: 2020-04-13 — End: 2020-07-17

## 2020-04-13 NOTE — Progress Notes (Signed)
Refill failed. resent °

## 2020-04-13 NOTE — Addendum Note (Signed)
Addended by: Antonietta Barcelona D on: 04/13/2020 10:33 AM   Modules accepted: Orders

## 2020-04-13 NOTE — Progress Notes (Signed)
Subjective:    Patient ID: Autumn Moyer, female    DOB: 01-07-1941, 80 y.o.   MRN: [id number]  Chief Complaint  Patient presents with  . Hypertension    Pt presents to the offic today for chronic follow up. She is followed by Cardiologists  for A Fib, Aortic stenosis and gets an Echo each year.She is currently on Eliquis 5 mg BID.   Hypertension This is a chronic problem. The current episode started more than 1 year ago. The problem has been resolved since onset. The problem is controlled. Associated symptoms include malaise/fatigue, peripheral edema ("a little") and shortness of breath. Risk factors for coronary artery disease include dyslipidemia, obesity and sedentary lifestyle. The current treatment provides moderate improvement. Hypertensive end-organ damage includes CAD/MI. Identifiable causes of hypertension include a thyroid problem.  Gastroesophageal Reflux She complains of belching, heartburn and a hoarse voice. This is a chronic problem. The current episode started in the past 7 days. The problem has been waxing and waning. Associated symptoms include fatigue. She has tried a PPI for the symptoms. The treatment provided moderate relief.  Thyroid Problem Presents for follow-up visit. Symptoms include fatigue, hair loss and hoarse voice. Patient reports no constipation, depressed mood or dry skin. The symptoms have been stable.  Anemia Presents for follow-up visit. Symptoms include malaise/fatigue. There has been no bruising/bleeding easily or fever.      Review of Systems  Constitutional: Positive for fatigue and malaise/fatigue. Negative for fever.  HENT: Positive for hoarse voice.   Respiratory: Positive for shortness of breath.   Gastrointestinal: Positive for heartburn. Negative for constipation.  Hematological: Does not bruise/bleed easily.  All other systems reviewed and are negative.      Objective:   Physical Exam Vitals reviewed.  Constitutional:       General: She is not in acute distress.    Appearance: She is well-developed and well-nourished.  HENT:     Head: Normocephalic and atraumatic.     Right Ear: Tympanic membrane normal.     Left Ear: Tympanic membrane normal.     Mouth/Throat:     Mouth: Oropharynx is clear and moist.  Eyes:     Pupils: Pupils are equal, round, and reactive to light.  Neck:     Thyroid: No thyromegaly.  Cardiovascular:     Rate and Rhythm: Normal rate and regular rhythm.     Pulses: Intact distal pulses.     Heart sounds: Normal heart sounds. No murmur heard.   Pulmonary:     Effort: Pulmonary effort is normal. No respiratory distress.     Breath sounds: Normal breath sounds. No wheezing.  Abdominal:     General: Bowel sounds are normal. There is no distension.     Palpations: Abdomen is soft.     Tenderness: There is no abdominal tenderness.  Musculoskeletal:        General: No tenderness or edema. Normal range of motion.     Cervical back: Normal range of motion and neck supple.  Skin:    General: Skin is warm and dry.  Neurological:     Mental Status: She is alert and oriented to person, place, and time.     Cranial Nerves: No cranial nerve deficit.     Deep Tendon Reflexes: Reflexes are normal and symmetric.  Psychiatric:        Mood and Affect: Mood and affect normal.        Behavior: Behavior normal.  Thought Content: Thought content normal.        Judgment: Judgment normal.       BP 121/61   Pulse 71   Temp 97.6 F (36.4 C) (Temporal)   Ht 5' 4"  (1.626 m)   Wt 176 lb 9.6 oz (80.1 kg)   SpO2 93%   BMI 30.31 kg/m      Assessment & Plan:  KADYNCE BONDS comes in today with chief complaint of Hypertension   Diagnosis and orders addressed:  1. Essential hypertension - benazepril-hydrochlorthiazide (LOTENSIN HCT) 20-25 MG tablet; Take 1 tablet by mouth daily.  Dispense: 90 tablet; Refill: 1 - CMP14+EGFR - CBC with Differential/Platelet  2. Essential hypertension,  benign - CMP14+EGFR - CBC with Differential/Platelet  3. Aortic valve stenosis, etiology of cardiac valve disease unspecified - CMP14+EGFR - CBC with Differential/Platelet  4. Hypothyroidism, unspecified type - CMP14+EGFR - CBC with Differential/Platelet - TSH  5. Obesity (BMI 30.0-34.9) - CMP14+EGFR - CBC with Differential/Platelet  6. Iron deficiency anemia, unspecified iron deficiency anemia type - CMP14+EGFR - CBC with Differential/Platelet   Labs pending Health Maintenance reviewed Diet and exercise encouraged  Follow up plan: 3 months    Evelina Dun, FNP

## 2020-04-13 NOTE — Addendum Note (Signed)
Addended by: Antonietta Barcelona D on: 04/13/2020 09:44 AM   Modules accepted: Orders

## 2020-04-13 NOTE — Patient Instructions (Addendum)
Hypothyroidism  Hypothyroidism is when the thyroid gland does not make enough of certain hormones (it is underactive). The thyroid gland is a small gland located in the lower front part of the neck, just in front of the windpipe (trachea). This gland makes hormones that help control how the body uses food for energy (metabolism) as well as how the heart and brain function. These hormones also play a role in keeping your bones strong. When the thyroid is underactive, it produces too little of the hormones thyroxine (T4) and triiodothyronine (T3). What are the causes? This condition may be caused by:  Hashimoto's disease. This is a disease in which the body's disease-fighting system (immune system) attacks the thyroid gland. This is the most common cause.  Viral infections.  Pregnancy.  Certain medicines.  Birth defects.  Past radiation treatments to the head or neck for cancer.  Past treatment with radioactive iodine.  Past exposure to radiation in the environment.  Past surgical removal of part or all of the thyroid.  Problems with a gland in the center of the brain (pituitary gland).  Lack of enough iodine in the diet. What increases the risk? You are more likely to develop this condition if:  You are female.  You have a family history of thyroid conditions.  You use a medicine called lithium.  You take medicines that affect the immune system (immunosuppressants). What are the signs or symptoms? Symptoms of this condition include:  Feeling as though you have no energy (lethargy).  Not being able to tolerate cold.  Weight gain that is not explained by a change in diet or exercise habits.  Lack of appetite.  Dry skin.  Coarse hair.  Menstrual irregularity.  Slowing of thought processes.  Constipation.  Sadness or depression. How is this diagnosed? This condition may be diagnosed based on:  Your symptoms, your medical history, and a physical exam.  Blood  tests. You may also have imaging tests, such as an ultrasound or MRI. How is this treated? This condition is treated with medicine that replaces the thyroid hormones that your body does not make. After you begin treatment, it may take several weeks for symptoms to go away. Follow these instructions at home:  Take over-the-counter and prescription medicines only as told by your health care provider.  If you start taking any new medicines, tell your health care provider.  Keep all follow-up visits as told by your health care provider. This is important. ? As your condition improves, your dosage of thyroid hormone medicine may change. ? You will need to have blood tests regularly so that your health care provider can monitor your condition. Contact a health care provider if:  Your symptoms do not get better with treatment.  You are taking thyroid hormone replacement medicine and you: ? Sweat a lot. ? Have tremors. ? Feel anxious. ? Lose weight rapidly. ? Cannot tolerate heat. ? Have emotional swings. ? Have diarrhea. ? Feel weak. Get help right away if you have:  Chest pain.  An irregular heartbeat.  A rapid heartbeat.  Difficulty breathing. Summary  Hypothyroidism is when the thyroid gland does not make enough of certain hormones (it is underactive).  When the thyroid is underactive, it produces too little of the hormones thyroxine (T4) and triiodothyronine (T3).  The most common cause is Hashimoto's disease, a disease in which the body's disease-fighting system (immune system) attacks the thyroid gland. The condition can also be caused by viral infections, medicine, pregnancy, or   past radiation treatment to the head or neck.  Symptoms may include weight gain, dry skin, constipation, feeling as though you do not have energy, and not being able to tolerate cold.  This condition is treated with medicine to replace the thyroid hormones that your body does not make. This  information is not intended to replace advice given to you by your health care provider. Make sure you discuss any questions you have with your health care provider. Document Revised: 12/06/2019 Document Reviewed: 11/21/2019 Elsevier Patient Education  2021 [employer]  

## 2020-04-14 DIAGNOSIS — R072 Precordial pain: Secondary | ICD-10-CM | POA: Insufficient documentation

## 2020-04-14 LAB — CMP14+EGFR
ALT: 18 IU/L (ref 0–32)
AST: 19 IU/L (ref 0–40)
Albumin/Globulin Ratio: 1.9 (ref 1.2–2.2)
Albumin: 4.5 g/dL (ref 3.7–4.7)
Alkaline Phosphatase: 54 IU/L (ref 44–121)
BUN/Creatinine Ratio: 18 (ref 12–28)
BUN: 17 mg/dL (ref 8–27)
Bilirubin Total: 1.4 mg/dL — ABNORMAL HIGH (ref 0.0–1.2)
CO2: 23 mmol/L (ref 20–29)
Calcium: 9.5 mg/dL (ref 8.7–10.3)
Chloride: 104 mmol/L (ref 96–106)
Creatinine, Ser: 0.92 mg/dL (ref 0.57–1.00)
GFR calc Af Amer: 69 mL/min/{1.73_m2} (ref >59)
GFR calc non Af Amer: 60 mL/min/{1.73_m2} (ref >59)
Globulin, Total: 2.4 g/dL (ref 1.5–4.5)
Glucose: 106 mg/dL — ABNORMAL HIGH (ref 65–99)
Potassium: 4.3 mmol/L (ref 3.5–5.2)
Sodium: 141 mmol/L (ref 134–144)
Total Protein: 6.9 g/dL (ref 6.0–8.5)

## 2020-04-14 LAB — CBC WITH DIFFERENTIAL/PLATELET
Basophils Absolute: 0.1 10*3/uL (ref 0.0–0.2)
Basos: 2 %
EOS (ABSOLUTE): 0.3 10*3/uL (ref 0.0–0.4)
Eos: 4 %
Hematocrit: 39.7 % (ref 34.0–46.6)
Hemoglobin: 13.9 g/dL (ref 11.1–15.9)
Immature Grans (Abs): 0 10*3/uL (ref 0.0–0.1)
Immature Granulocytes: 1 %
Lymphocytes Absolute: 2.7 10*3/uL (ref 0.7–3.1)
Lymphs: 40 %
MCH: 30.3 pg (ref 26.6–33.0)
MCHC: 35 g/dL (ref 31.5–35.7)
MCV: 87 fL (ref 79–97)
Monocytes Absolute: 0.6 10*3/uL (ref 0.1–0.9)
Monocytes: 8 %
Neutrophils Absolute: 3.1 10*3/uL (ref 1.4–7.0)
Neutrophils: 45 %
Platelets: 279 10*3/uL (ref 150–450)
RBC: 4.58 x10E6/uL (ref 3.77–5.28)
RDW: 12.3 % (ref 11.7–15.4)
WBC: 6.8 10*3/uL (ref 3.4–10.8)

## 2020-04-14 LAB — TSH: TSH: 1.99 u[IU]/mL (ref 0.450–4.500)

## 2020-04-14 NOTE — Progress Notes (Unsigned)
Cardiology Office Note   Date:  04/15/2020   ID:  Autumn Moyer, Autumn Moyer 08/24/1940, MRN [id number]  PCP:  Sharion Balloon, FNP  Cardiologist:   Minus Breeding, MD   Chief Complaint  Patient presents with  . Shortness of Breath      History of Present Illness: Autumn Moyer is a 80 y.o. female who presents for evaluation of aortic stenosis and new onset atrial fibrillation. Her AS was moderate in August in 2020.  Atrial fib was new prior to the last visit and confirmed on a follow up monitor.  Her AS was found to be moderate on echo.  She presents for follow up.    I did review with her the monitor.  Says she spends about 40% of her time in fibrillation with the longest episode lasting about 19 hours.  However, the rate did not seem to be particularly high.  I cannot correlate her fibrillation with any symptoms.  She says when she checks her heart rate on her oximeter it is in the 60s or 70s.  She does not find it to be elevated.  She does get dyspneic walking a slight distance on level ground.  She is not describing resting shortness of breath, PND or orthopnea.  She is not having any presyncope or syncope.  She has never correlated her shortness of breath with her oxygen saturation for her heart rate.    Past Medical History:  Diagnosis Date  . Atrial fibrillation ([place])   . Hypertension   . Squamous cell carcinoma of skin 10/23/2013   in situ- left forearm (CX35FU)  . Squamous cell carcinoma of skin 01/16/2018   in situ-right knee post (txpbx)  . Thyroid disease     Past Surgical History:  Procedure Laterality Date  . EYE SURGERY    . TOE SURGERY    . TUBAL LIGATION       Current Outpatient Medications  Medication Sig Dispense Refill  . acetaminophen (TYLENOL) 325 MG tablet Take 650 mg by mouth every 6 (six) hours as needed.    Marland Kitchen apixaban (ELIQUIS) 5 MG TABS tablet Take 1 tablet (5 mg total) by mouth 2 (two) times daily. 60 tablet 3  . benazepril-hydrochlorthiazide  (LOTENSIN HCT) 20-25 MG tablet Take 1 tablet by mouth daily. 90 tablet 1  . levocetirizine (XYZAL) 5 MG tablet TAKE 1 TABLET BY MOUTH ONCE DAILY IN THE EVENING 90 tablet 1  . levothyroxine (SYNTHROID) 88 MCG tablet Take 1 tablet (88 mcg total) by mouth daily. 30 tablet 11  . Lysine 500 MG TABS Take by mouth 2 (two) times daily.     . metoprolol tartrate (LOPRESSOR) 25 MG tablet Take 1 tablet (25 mg total) by mouth 2 (two) times daily. 180 tablet 3  . mometasone (ELOCON) 0.1 % cream Apply 1 application topically daily. 45 g 2  . Multiple Vitamin (MULTIVITAMIN) capsule Take 1 capsule by mouth daily.    . Omega-3 Fatty Acids (FISH OIL) 500 MG CAPS Take 1,000 mg by mouth daily.    . pantoprazole (PROTONIX) 40 MG tablet Take 1 tablet (40 mg total) by mouth daily. 90 tablet 1  . tacrolimus (PROTOPIC) 0.1 % ointment Apply topically in the morning and at bedtime. 30 g 4   No current facility-administered medications for this visit.    Allergies:   Noroxin [norfloxacin]    ROS:  Please see the history of present illness.   Otherwise, review of systems are positive for  numbness on her fingertips.   All other systems are reviewed and negative.    PHYSICAL EXAM: VS:  BP 120/82   Pulse 72   Ht 5\' 4"  (1.626 m)   Wt 176 lb (79.8 kg)   BMI 30.21 kg/m  , BMI Body mass index is 30.21 kg/m. GENERAL:  Well appearing NECK:  No jugular venous distention, waveform within normal limits, carotid upstroke brisk and symmetric, no bruits, no thyromegaly LUNGS:  Clear to auscultation bilaterally CHEST:  Unremarkable HEART:  PMI not displaced or sustained,S1 and S2 within normal limits, no S3, no S4, no clicks, no rubs, slight apical systolic murmur radiating out the aortic outflow tract murmurs ABD:  Flat, positive bowel sounds normal in frequency in pitch, no bruits, no rebound, no guarding, no midline pulsatile mass, no hepatomegaly, no splenomegaly EXT:  2 plus pulses throughout, no edema, no cyanosis no  clubbing   EKG:  EKG is not ordered today.   Recent Labs: 04/13/2020: ALT 18; BUN 17; Creatinine, Ser 0.92; Hemoglobin 13.9; Platelets 279; Potassium 4.3; Sodium 141; TSH 1.990    Lipid Panel No results found for: CHOL, TRIG, HDL, CHOLHDL, VLDL, LDLCALC, LDLDIRECT    Wt Readings from Last 3 Encounters:  04/15/20 176 lb (79.8 kg)  04/13/20 176 lb 9.6 oz (80.1 kg)  03/02/20 176 lb 9.6 oz (80.1 kg)      Other studies Reviewed: Additional studies/ records that were reviewed today include: Event monitor Review of the above records demonstrates:    See elsewhere   ASSESSMENT AND PLAN:  AS:   This was moderate.   I do not think this is contributing to her symptoms I will follow this clinically.   HTN: Her blood pressure is at target.  No change in therapy.   SOB:     I do not see a clear cardiac etiology but I am going to exclude obstructive coronary disease with a Lexiscan Myoview and also check a BNP level.   ATRIAL FIB: She has new onset paroxysmal atrial fibrillation.   Autumn Moyer has a CHA2DS2 - VASc score of 4.  She tolerates anticoagulation and seems to have good rate control.  I am going to try to have her correlate any shortness of breath with her heart rate.  At present it does not seem that there is a correlation with the rhythm.    Current medicines are reviewed at length with the patient today.  The patient does not have concerns regarding medicines.  The following changes have been made:  None  Disposition:   Follow up with after the [place].  Signed, Minus Breeding, MD  04/15/2020 11:27 AM    [facility]

## 2020-04-15 ENCOUNTER — Encounter: Payer: Self-pay | Admitting: Cardiology

## 2020-04-15 ENCOUNTER — Ambulatory Visit: Payer: PPO | Admitting: Cardiology

## 2020-04-15 ENCOUNTER — Encounter: Payer: Self-pay | Admitting: *Deleted

## 2020-04-15 ENCOUNTER — Other Ambulatory Visit: Payer: Self-pay

## 2020-04-15 VITALS — BP 120/82 | HR 72 | Ht 64.0 in | Wt 176.0 lb

## 2020-04-15 DIAGNOSIS — I480 Paroxysmal atrial fibrillation: Secondary | ICD-10-CM | POA: Diagnosis not present

## 2020-04-15 DIAGNOSIS — I10 Essential (primary) hypertension: Secondary | ICD-10-CM | POA: Diagnosis not present

## 2020-04-15 DIAGNOSIS — I350 Nonrheumatic aortic (valve) stenosis: Secondary | ICD-10-CM

## 2020-04-15 DIAGNOSIS — R072 Precordial pain: Secondary | ICD-10-CM | POA: Diagnosis not present

## 2020-04-15 DIAGNOSIS — R0602 Shortness of breath: Secondary | ICD-10-CM | POA: Diagnosis not present

## 2020-04-15 NOTE — Patient Instructions (Signed)
Medication Instructions:  The current medical regimen is effective;  continue present plan and medications.  *If you need a refill on your cardiac medications before your next appointment, please call your pharmacy*  Lab Work: Please have blood work when you have your stress test. (pro-BNP) If you have labs (blood work) drawn today and your tests are completely normal, you will receive your results only by: Marland Kitchen MyChart Message (if you have MyChart) OR . A paper copy in the mail If you have any lab test that is abnormal or we need to change your treatment, we will call you to review the results.   Testing/Procedures: Your physician has requested that you have a lexiscan myoview. For further information please visit [email or web address]. Please follow instruction sheet, as given.  Follow-Up: At [facility], you and your health needs are our priority.  As part of our continuing mission to provide you with exceptional heart care, we have created designated Provider Care Teams.  These Care Teams include your primary Cardiologist (physician) and Advanced Practice Providers (APPs -  Physician Assistants and Nurse Practitioners) who all work together to provide you with the care you need, when you need it.  We recommend signing up for the patient portal called "MyChart".  Sign up information is provided on this After Visit Summary.  MyChart is used to connect with patients for Virtual Visits (Telemedicine).  Patients are able to view lab/test results, encounter notes, upcoming appointments, etc.  Non-urgent messages can be sent to your provider as well.   To learn more about what you can do with MyChart, go to [email or web address].    Your next appointment:   Follow up with Dr Percival Spanish based on the results of the above testing.   Thank you for choosing [facility]!!

## 2020-04-15 NOTE — Addendum Note (Signed)
Addended by: Shellia Cleverly on: 04/15/2020 03:55 PM   Modules accepted: Orders

## 2020-04-22 NOTE — Addendum Note (Signed)
Addended by: Minus Breeding on: 04/22/2020 11:34 AM   Modules accepted: Orders

## 2020-04-23 ENCOUNTER — Telehealth (HOSPITAL_COMMUNITY): Payer: Self-pay | Admitting: *Deleted

## 2020-04-23 NOTE — Telephone Encounter (Signed)
Close encounter 

## 2020-04-24 ENCOUNTER — Other Ambulatory Visit: Payer: Self-pay

## 2020-04-24 ENCOUNTER — Ambulatory Visit (HOSPITAL_COMMUNITY)
Admission: RE | Admit: 2020-04-24 | Discharge: 2020-04-24 | Disposition: A | Discharge status: Home / Self Care | Payer: PPO | Source: Ambulatory Visit | Attending: Internal Medicine | Admitting: Internal Medicine

## 2020-04-24 DIAGNOSIS — R072 Precordial pain: Secondary | ICD-10-CM | POA: Insufficient documentation

## 2020-04-24 DIAGNOSIS — I480 Paroxysmal atrial fibrillation: Secondary | ICD-10-CM | POA: Diagnosis not present

## 2020-04-24 DIAGNOSIS — R0602 Shortness of breath: Secondary | ICD-10-CM

## 2020-04-24 LAB — MYOCARDIAL PERFUSION IMAGING
LV dias vol: 86 mL (ref 46–106)
LV sys vol: 35 mL
Peak HR: 75 {beats}/min
Rest HR: 55 {beats}/min
SDS: 5
SRS: 8
SSS: 13
TID: 1.02

## 2020-04-24 MED ORDER — REGADENOSON 0.4 MG/5ML IV SOLN
0.4000 mg | Freq: Once | INTRAVENOUS | Status: AC
  Administered 2020-04-24: 0.4 mg via INTRAVENOUS

## 2020-04-24 MED ORDER — TECHNETIUM TC 99M TETROFOSMIN IV KIT
26.5000 | PACK | Freq: Once | INTRAVENOUS | Status: AC | PRN
  Administered 2020-04-24: 26.5 via INTRAVENOUS
  Filled 2020-04-24: qty 27

## 2020-04-24 MED ORDER — TECHNETIUM TC 99M TETROFOSMIN IV KIT
8.7000 | PACK | Freq: Once | INTRAVENOUS | Status: AC | PRN
  Administered 2020-04-24: 8.7 via INTRAVENOUS
  Filled 2020-04-24: qty 9

## 2020-04-25 LAB — PRO B NATRIURETIC PEPTIDE: NT-Pro BNP: 479 pg/mL (ref 0–738)

## 2020-04-28 ENCOUNTER — Other Ambulatory Visit: Payer: Self-pay

## 2020-04-28 MED ORDER — FUROSEMIDE 20 MG PO TABS: 20.0000 mg | ORAL_TABLET | Freq: Every day | ORAL | 3 refills | Status: DC

## 2020-04-28 NOTE — Progress Notes (Signed)
20mg  Lasix daily sent to pharmacy per result note.

## 2020-05-11 ENCOUNTER — Other Ambulatory Visit: Payer: Self-pay | Admitting: Family

## 2020-05-11 DIAGNOSIS — J309 Allergic rhinitis, unspecified: Secondary | ICD-10-CM

## 2020-05-15 ENCOUNTER — Other Ambulatory Visit: Payer: Self-pay | Admitting: Family

## 2020-05-15 DIAGNOSIS — I4891 Unspecified atrial fibrillation: Secondary | ICD-10-CM

## 2020-05-18 ENCOUNTER — Telehealth: Payer: Self-pay

## 2020-05-18 DIAGNOSIS — I4891 Unspecified atrial fibrillation: Secondary | ICD-10-CM

## 2020-05-18 NOTE — Telephone Encounter (Signed)
LMOVM refill was done via in basket this morning

## 2020-05-18 NOTE — Telephone Encounter (Signed)
  Prescription Request  05/18/2020  What is the name of the medication or equipment? Eloquise  Have you contacted your pharmacy to request a refill? (if applicable) yes  Which pharmacy would you like this sent to? [facility]    Patient notified that their request is being sent to the clinical staff for review and that they should receive a response within 2 business days.   Lenna Gilford' pt.  Please call pt.  She needs today. She sent it in last week.

## 2020-07-01 ENCOUNTER — Encounter: Payer: Self-pay | Admitting: Nurse Practitioner

## 2020-07-01 ENCOUNTER — Telehealth: Payer: Self-pay

## 2020-07-01 ENCOUNTER — Ambulatory Visit (INDEPENDENT_AMBULATORY_CARE_PROVIDER_SITE_OTHER): Payer: PPO | Admitting: Nurse Practitioner

## 2020-07-01 DIAGNOSIS — J0110 Acute frontal sinusitis, unspecified: Secondary | ICD-10-CM | POA: Diagnosis not present

## 2020-07-01 MED ORDER — AMOXICILLIN-POT CLAVULANATE 875-125 MG PO TABS: 1.0000 | ORAL_TABLET | Freq: Two times a day (BID) | ORAL | 0 refills | Status: DC

## 2020-07-01 MED ORDER — SALINE SPRAY 0.65 % NA SOLN: 1.0000 | NASAL | 0 refills | Status: DC | PRN

## 2020-07-01 MED ORDER — BENZONATATE 100 MG PO CAPS: 100.0000 mg | ORAL_CAPSULE | Freq: Three times a day (TID) | ORAL | 0 refills | Status: DC | PRN

## 2020-07-01 MED ORDER — PREDNISONE 10 MG (21) PO TBPK: ORAL_TABLET | ORAL | 0 refills | Status: DC

## 2020-07-01 NOTE — Assessment & Plan Note (Signed)
Symptoms not well controlled in the last 7 days.  Patient is reporting worsening cough with phlegm production, sinus pressure mild itchy sore throat and head pressure.  Patient is not reporting fever, chills nausea or vomiting.   benzonatate for cough, Augmentin 875-125 mg tablet twice daily for 7 days, prednisone pack, COVID-19 swab completed results pending.  Follow-up with worsening unresolved symptoms.

## 2020-07-01 NOTE — Progress Notes (Signed)
   Virtual Visit  Note Due to COVID-19 pandemic this visit was conducted virtually. This visit type was conducted due to national recommendations for restrictions regarding the COVID-19 Pandemic (e.g. social distancing, sheltering in place) in an effort to limit this patient's exposure and mitigate transmission in our community. All issues noted in this document were discussed and addressed.  A physical exam was not performed with this format.  I connected with Autumn Moyer on 07/01/20 at 1:30 pm  by telephone and verified that I am speaking with the correct person using two identifiers. Autumn Moyer is currently located at home during visit. The provider, Ivy Lynn, NP is located in their office at time of visit.  I discussed the limitations, risks, security and privacy concerns of performing an evaluation and management service by telephone and the availability of in person appointments. I also discussed with the patient that there may be a patient responsible charge related to this service. The patient expressed understanding and agreed to proceed.   History and Present Illness:  Sinusitis This is a recurrent problem. The current episode started in the past 7 days. The problem has been gradually worsening since onset. There has been no fever. The pain is moderate. Associated symptoms include congestion, coughing, sinus pressure and a sore throat. Pertinent negatives include no chills. Treatments tried: Antihistamine. The treatment provided mild relief.      Review of Systems  Constitutional: Negative for chills.  HENT: Positive for congestion, sinus pressure and sore throat.   Respiratory: Positive for cough.      Observations/Objective: Televisit-patient did not sound to be in distress.  Assessment and Plan: Subacute frontal sinusitis Symptoms not well controlled in the last 7 days.  Patient is reporting worsening cough with phlegm production, sinus pressure mild itchy sore  throat and head pressure.  Patient is not reporting fever, chills nausea or vomiting.   benzonatate for cough, Augmentin 875-125 mg tablet twice daily for 7 days, prednisone pack, COVID-19 swab completed results pending.  Follow-up with worsening unresolved symptoms.   Follow Up Instructions: Follow-up with worsening or unresolved symptoms.    I discussed the assessment and treatment plan with the patient. The patient was provided an opportunity to ask questions and all were answered. The patient agreed with the plan and demonstrated an understanding of the instructions.   The patient was advised to call back or seek an in-person evaluation if the symptoms worsen or if the condition fails to improve as anticipated.  The above assessment and management plan was discussed with the patient. The patient verbalized understanding of and has agreed to the management plan. Patient is aware to call the clinic if symptoms persist or worsen. Patient is aware when to return to the clinic for a follow-up visit. Patient educated on when it is appropriate to go to the emergency department.   Time call ended:  1:42 pm   I provided 12 minutes of  non face-to-face time during this encounter.    Ivy Lynn, NP

## 2020-07-01 NOTE — Patient Instructions (Signed)

## 2020-07-01 NOTE — Telephone Encounter (Signed)
Called [facility] _ they did get all 4 meds - did not fill the nasal spray b/c it was available OTC. They did not tell her that - I called and made pt aware.

## 2020-07-02 LAB — SARS-COV-2, NAA 2 DAY TAT

## 2020-07-02 LAB — NOVEL CORONAVIRUS, NAA: SARS-CoV-2, NAA: NOT DETECTED

## 2020-07-10 ENCOUNTER — Ambulatory Visit (INDEPENDENT_AMBULATORY_CARE_PROVIDER_SITE_OTHER): Payer: PPO | Admitting: Nurse Practitioner

## 2020-07-10 ENCOUNTER — Other Ambulatory Visit: Payer: Self-pay

## 2020-07-10 DIAGNOSIS — R300 Dysuria: Secondary | ICD-10-CM | POA: Diagnosis not present

## 2020-07-10 DIAGNOSIS — M545 Low back pain, unspecified: Secondary | ICD-10-CM | POA: Diagnosis not present

## 2020-07-10 LAB — MICROSCOPIC EXAMINATION
Epithelial Cells (non renal): NONE SEEN /hpf (ref 0–10)
WBC, UA: NONE SEEN /hpf (ref 0–5)

## 2020-07-10 LAB — URINALYSIS, COMPLETE
Bilirubin, UA: NEGATIVE
Glucose, UA: NEGATIVE
Ketones, UA: NEGATIVE
Leukocytes,UA: NEGATIVE
Nitrite, UA: NEGATIVE
Protein,UA: NEGATIVE
Specific Gravity, UA: 1.02 (ref 1.005–1.030)
Urobilinogen, Ur: 0.2 mg/dL (ref 0.2–1.0)
pH, UA: 5.5 (ref 5.0–7.5)

## 2020-07-10 NOTE — Progress Notes (Signed)
   Virtual Visit  Note Due to COVID-19 pandemic this visit was conducted virtually. This visit type was conducted due to national recommendations for restrictions regarding the COVID-19 Pandemic (e.g. social distancing, sheltering in place) in an effort to limit this patient's exposure and mitigate transmission in our community. All issues noted in this document were discussed and addressed.  A physical exam was not performed with this format.  I connected with Autumn Moyer on 07/10/20 at 12:42 by telephone and verified that I am speaking with the correct person using two identifiers. Autumn Moyer is currently located at home and no one is currently with her during visit. The provider, Mary-Margaret Hassell Done, FNP is located in their office at time of visit.  I discussed the limitations, risks, security and privacy concerns of performing an evaluation and management service by telephone and the availability of in person appointments. I also discussed with the patient that there may be a patient responsible charge related to this service. The patient expressed understanding and agreed to proceed.   History and Present Illness:   Chief Complaint: Urinary Tract Infection   HPI Patient calls in today c/o dysuria and pubic pain and back pain. Has some urinary frequency. Started last week.    Review of Systems  Constitutional: Negative.   Genitourinary: Positive for dysuria (intermittent) and frequency.  Musculoskeletal: Positive for back pain.  Neurological: Negative.   Psychiatric/Behavioral: Negative.   All other systems reviewed and are negative.    Observations/Objective: Alert and oriented- answers all questions appropriately No distress    Assessment and Plan: Alert and oriented- answers all questions appropriately No distress  * patient brought urine in which was clear today  Follow Up Instructions: Autumn Moyer in today with chief complaint of Urinary Tract  Infection   1. Dysuria Cranberry juice - Urinalysis, Complete  2. Acute midline low back pain, unspecified whether sciatica present Tylenol OTC Moist heat If  Not improving follow up with your PCP       I discussed the assessment and treatment plan with the patient. The patient was provided an opportunity to ask questions and all were answered. The patient agreed with the plan and demonstrated an understanding of the instructions.   The patient was advised to call back or seek an in-person evaluation if the symptoms worsen or if the condition fails to improve as anticipated.  The above assessment and management plan was discussed with the patient. The patient verbalized understanding of and has agreed to the management plan. Patient is aware to call the clinic if symptoms persist or worsen. Patient is aware when to return to the clinic for a follow-up visit. Patient educated on when it is appropriate to go to the emergency department.   Time call ended:  12:54  I provided 12 minutes of  non face-to-face time during this encounter.    Mary-Margaret Hassell Done, FNP

## 2020-07-17 ENCOUNTER — Other Ambulatory Visit: Payer: Self-pay

## 2020-07-17 ENCOUNTER — Encounter: Payer: Self-pay | Admitting: Family

## 2020-07-17 ENCOUNTER — Ambulatory Visit (INDEPENDENT_AMBULATORY_CARE_PROVIDER_SITE_OTHER): Payer: PPO | Admitting: Family

## 2020-07-17 VITALS — BP 139/82 | HR 94 | Temp 97.4°F | Ht 64.0 in | Wt 175.2 lb

## 2020-07-17 DIAGNOSIS — R7303 Prediabetes: Secondary | ICD-10-CM | POA: Diagnosis not present

## 2020-07-17 DIAGNOSIS — E039 Hypothyroidism, unspecified: Secondary | ICD-10-CM | POA: Diagnosis not present

## 2020-07-17 DIAGNOSIS — Z23 Encounter for immunization: Secondary | ICD-10-CM

## 2020-07-17 DIAGNOSIS — D509 Iron deficiency anemia, unspecified: Secondary | ICD-10-CM | POA: Diagnosis not present

## 2020-07-17 DIAGNOSIS — K219 Gastro-esophageal reflux disease without esophagitis: Secondary | ICD-10-CM

## 2020-07-17 DIAGNOSIS — I4891 Unspecified atrial fibrillation: Secondary | ICD-10-CM

## 2020-07-17 DIAGNOSIS — I480 Paroxysmal atrial fibrillation: Secondary | ICD-10-CM | POA: Diagnosis not present

## 2020-07-17 DIAGNOSIS — K2100 Gastro-esophageal reflux disease with esophagitis, without bleeding: Secondary | ICD-10-CM

## 2020-07-17 DIAGNOSIS — I10 Essential (primary) hypertension: Secondary | ICD-10-CM | POA: Diagnosis not present

## 2020-07-17 DIAGNOSIS — E66811 Obesity, class 1: Secondary | ICD-10-CM

## 2020-07-17 DIAGNOSIS — E669 Obesity, unspecified: Secondary | ICD-10-CM | POA: Diagnosis not present

## 2020-07-17 DIAGNOSIS — K449 Diaphragmatic hernia without obstruction or gangrene: Secondary | ICD-10-CM | POA: Diagnosis not present

## 2020-07-17 MED ORDER — BENAZEPRIL-HYDROCHLOROTHIAZIDE 20-25 MG PO TABS: 1.0000 | ORAL_TABLET | Freq: Every day | ORAL | 1 refills | Status: DC

## 2020-07-17 MED ORDER — PANTOPRAZOLE SODIUM 40 MG PO TBEC: 40.0000 mg | DELAYED_RELEASE_TABLET | Freq: Every day | ORAL | 1 refills | Status: DC

## 2020-07-17 MED ORDER — ELIQUIS 5 MG PO TABS: 1.0000 | ORAL_TABLET | Freq: Two times a day (BID) | ORAL | 1 refills | Status: DC

## 2020-07-17 NOTE — Progress Notes (Signed)
Subjective:    Patient ID: Autumn Moyer, female    DOB: [birth year], 80 y.o.   MRN: [id number]  Chief Complaint  Patient presents with  . Hypertension    Declines dexa today    Pt presents to the offic today for chronic follow up. She is followed by Cardiologists  for A Fib, Aortic stenosis and gets an Echo each year.She is currently on Eliquis 5 mg BID.  Hypertension This is a chronic problem. The current episode started more than 1 year ago. The problem has been resolved since onset. The problem is controlled. Pertinent negatives include no blurred vision, malaise/fatigue, peripheral edema or shortness of breath. Risk factors for coronary artery disease include dyslipidemia and sedentary lifestyle. The current treatment provides moderate improvement.  Gastroesophageal Reflux She complains of belching and heartburn. This is a chronic problem. The current episode started more than 1 year ago. The problem occurs occasionally. The symptoms are aggravated by certain foods. She has tried a PPI for the symptoms. The treatment provided moderate relief.  Anemia Presents for follow-up visit. There has been no confusion or malaise/fatigue.  Diabetes She presents for her follow-up diabetic visit. She has type 2 diabetes mellitus. Pertinent negatives for hypoglycemia include no confusion. Pertinent negatives for diabetes include no blurred vision and no foot paresthesias. Symptoms are stable. Diabetic complications include heart disease. Risk factors for coronary artery disease include dyslipidemia, diabetes mellitus and sedentary lifestyle. (Does not check BS at home)      Review of Systems  Constitutional: Negative for malaise/fatigue.  Eyes: Negative for blurred vision.  Respiratory: Negative for shortness of breath.   Gastrointestinal: Positive for heartburn.  Psychiatric/Behavioral: Negative for confusion.       Objective:   Physical Exam Vitals reviewed.  Constitutional:       General: She is not in acute distress.    Appearance: She is well-developed.  HENT:     Head: Normocephalic and atraumatic.     Right Ear: Tympanic membrane normal.     Left Ear: Tympanic membrane normal.  Eyes:     Pupils: Pupils are equal, round, and reactive to light.  Neck:     Thyroid: No thyromegaly.  Cardiovascular:     Rate and Rhythm: Normal rate and regular rhythm.     Heart sounds: Murmur heard.    Pulmonary:     Effort: Pulmonary effort is normal. No respiratory distress.     Breath sounds: Normal breath sounds. No wheezing.  Abdominal:     General: Bowel sounds are normal. There is no distension.     Palpations: Abdomen is soft.     Tenderness: There is no abdominal tenderness.  Musculoskeletal:        General: No tenderness. Normal range of motion.     Cervical back: Normal range of motion and neck supple.  Skin:    General: Skin is warm and dry.  Neurological:     Mental Status: She is alert and oriented to person, place, and time.     Cranial Nerves: No cranial nerve deficit.     Deep Tendon Reflexes: Reflexes are normal and symmetric.  Psychiatric:        Behavior: Behavior normal.        Thought Content: Thought content normal.        Judgment: Judgment normal.       BP 139/82   Pulse 94   Temp (!) 97.4 F (36.3 C) (Temporal)   Ht _0  (1.626  m)   Wt 175 lb 3.2 oz (79.5 kg)   SpO2 95%   BMI 30.07 kg/m      Assessment & Plan:  EARLIE ARCIGA comes in today with chief complaint of Hypertension (Declines dexa today )   Diagnosis and orders addressed:  1. New onset atrial fibrillation (HCC)  - apixaban (ELIQUIS) 5 MG TABS tablet; Take 1 tablet (5 mg total) by mouth 2 (two) times daily.  Dispense: 60 tablet; Refill: 1 - CMP14+EGFR - CBC with Differential/Platelet  2. Gastroesophageal reflux disease - pantoprazole (PROTONIX) 40 MG tablet; Take 1 tablet (40 mg total) by mouth daily.  Dispense: 90 tablet; Refill: 1 - CMP14+EGFR - CBC with  Differential/Platelet  3. Essential hypertension - benazepril-hydrochlorthiazide (LOTENSIN HCT) 20-25 MG tablet; Take 1 tablet by mouth daily.  Dispense: 90 tablet; Refill: 1 - CMP14+EGFR - CBC with Differential/Platelet  4. Large hiatal hernia - CMP14+EGFR - CBC with Differential/Platelet  5. Essential hypertension, benign - CMP14+EGFR - CBC with Differential/Platelet  6. Paroxysmal atrial fibrillation (HCC) - CMP14+EGFR - CBC with Differential/Platelet  7. Hypothyroidism, unspecified type  - CMP14+EGFR - CBC with Differential/Platelet - TSH  8. Gastroesophageal reflux disease with esophagitis, unspecified whether hemorrhage - CMP14+EGFR - CBC with Differential/Platelet  9. Iron deficiency anemia, unspecified iron deficiency anemia type  - CMP14+EGFR - CBC with Differential/Platelet  10. Obesity (BMI 30.0-34.9) - CMP14+EGFR - CBC with Differential/Platelet  11. Prediabetes - CMP14+EGFR - CBC with Differential/Platelet  12. Need for shingles vaccine - Varicella-zoster vaccine IM (Shingrix) - CMP14+EGFR - CBC with Differential/Platelet   Labs pending Health Maintenance reviewed Diet and exercise encouraged  Follow up plan: 4 months    Evelina Dun, FNP

## 2020-07-17 NOTE — Patient Instructions (Signed)
Diabetes Care, 44(Suppl 1), D40-C14. https://doi.org/https://doi.org/10.2337/dc21-S003">  Prediabetes Prediabetes is when your blood sugar (blood glucose) level is higher than normal but not high enough for you to be diagnosed with type 2 diabetes. Having prediabetes puts you at risk for developing type 2 diabetes (type 2 diabetes mellitus). With certain lifestyle changes, you may be able to prevent or delay the onset of type 2 diabetes. This is important because type 2 diabetes can lead to serious complications, such as:  Heart disease.  Stroke.  Blindness.  Kidney disease.  Depression.  Poor circulation in the feet and legs. In severe cases, this could lead to surgical removal of a leg (amputation). What are the causes? The exact cause of prediabetes is not known. It may result from insulin resistance. Insulin resistance develops when cells in the body do not respond properly to insulin that the body makes. This can cause excess glucose to build up in the blood. High blood glucose (hyperglycemia) can develop. What increases the risk? The following factors may make you more likely to develop this condition:  You have a family member with type 2 diabetes.  You are older than 45 years.  You had a temporary form of diabetes during a pregnancy (gestational diabetes).  You had polycystic ovary syndrome (PCOS).  You are overweight or obese.  You are inactive (sedentary).  You have a history of heart disease, including problems with cholesterol levels, high levels of blood fats, or high blood pressure. What are the signs or symptoms? You may have no symptoms. If you do have symptoms, they may include:  Increased hunger.  Increased thirst.  Increased urination.  Vision changes, such as blurry vision.  Tiredness (fatigue). How is this diagnosed? This condition can be diagnosed with blood tests. Your blood glucose may be checked with one or more of the following tests:  A  fasting blood glucose (FBG) test. You will not be allowed to eat (you will fast) for at least 8 hours before a blood sample is taken.  An A1C blood test (hemoglobin A1C). This test provides information about blood glucose levels over the previous 2?3 months.  An oral glucose tolerance test (OGTT). This test measures your blood glucose at two points in time: ? After fasting. This is your baseline level. ? Two hours after you drink a beverage that contains glucose. You may be diagnosed with prediabetes if:  Your FBG is 100?125 mg/dL (5.6-6.9 mmol/L).  Your A1C level is 5.7?6.4% (39-46 mmol/mol).  Your OGTT result is 140?199 mg/dL (7.8-11 mmol/L). These blood tests may be repeated to confirm your diagnosis.   How is this treated? Treatment may include dietary and lifestyle changes to help lower your blood glucose and prevent type 2 diabetes from developing. In some cases, medicine may be prescribed to help lower the risk of type 2 diabetes. Follow these instructions at home: Nutrition  Follow a healthy meal plan. This includes eating lean proteins, whole grains, legumes, fresh fruits and vegetables, low-fat dairy products, and healthy fats.  Follow instructions from your health care provider about eating or drinking restrictions.  Meet with a dietitian to create a healthy eating plan that is right for you.   Lifestyle  Do moderate-intensity exercise for at least 30 minutes a day on 5 or more days each week, or as told by your health care provider. A mix of activities may be best, such as: ? Brisk walking, swimming, biking, and weight lifting.  Lose weight as told by your health  care provider. Losing 5-7% of your body weight can reverse insulin resistance.  Do not drink alcohol if: ? Your health care provider tells you not to drink. ? You are pregnant, may be pregnant, or are planning to become pregnant.  If you drink alcohol: ? Limit how much you use to:  0-1 drink a day for  women.  0-2 drinks a day for men. ? Be aware of how much alcohol is in your drink. In the [place] one drink equals one 12 oz bottle of beer (355 mL), one 5 oz glass of wine (148 mL), or one 1 oz glass of hard liquor (44 mL). General instructions  Take over-the-counter and prescription medicines only as told by your health care provider. You may be prescribed medicines that help lower the risk of type 2 diabetes.  Do not use any products that contain nicotine or tobacco, such as cigarettes, e-cigarettes, and chewing tobacco. If you need help quitting, ask your health care provider.  Keep all follow-up visits. This is important. Where to find more information  American Diabetes Association: www.diabetes.org  Academy of Nutrition and Dietetics: www.eatright.org  American Heart Association: www.heart.org Contact a health care provider if:  You have any of these symptoms: ? Increased hunger. ? Increased urination. ? Increased thirst. ? Fatigue. ? Vision changes, such as blurry vision. Get help right away if you:  Have shortness of breath.  Feel confused.  Vomit or feel like you may vomit. Summary  Prediabetes is when your blood sugar (blood glucose)level is higher than normal but not high enough for you to be diagnosed with type 2 diabetes.  Having prediabetes puts you at risk for developing type 2 diabetes (type 2 diabetes mellitus).  Make lifestyle changes such as eating a healthy diet and exercising regularly to help prevent diabetes. Lose weight as told by your health care provider. This information is not intended to replace advice given to you by your health care provider. Make sure you discuss any questions you have with your health care provider. Document Revised: 06/06/2019 Document Reviewed: 06/06/2019 Elsevier Patient Education  2021 Elsevier Inc.  

## 2020-07-18 LAB — CMP14+EGFR
ALT: 21 IU/L (ref 0–32)
AST: 18 IU/L (ref 0–40)
Albumin/Globulin Ratio: 1.9 (ref 1.2–2.2)
Albumin: 4.6 g/dL (ref 3.7–4.7)
Alkaline Phosphatase: 60 IU/L (ref 44–121)
BUN/Creatinine Ratio: 11 — ABNORMAL LOW (ref 12–28)
BUN: 12 mg/dL (ref 8–27)
Bilirubin Total: 1.3 mg/dL — ABNORMAL HIGH (ref 0.0–1.2)
CO2: 22 mmol/L (ref 20–29)
Calcium: 10.2 mg/dL (ref 8.7–10.3)
Chloride: 99 mmol/L (ref 96–106)
Creatinine, Ser: 1.07 mg/dL — ABNORMAL HIGH (ref 0.57–1.00)
Globulin, Total: 2.4 g/dL (ref 1.5–4.5)
Glucose: 112 mg/dL — ABNORMAL HIGH (ref 65–99)
Potassium: 4.6 mmol/L (ref 3.5–5.2)
Sodium: 139 mmol/L (ref 134–144)
Total Protein: 7 g/dL (ref 6.0–8.5)
eGFR: 53 mL/min/{1.73_m2} — ABNORMAL LOW (ref >59)

## 2020-07-18 LAB — CBC WITH DIFFERENTIAL/PLATELET
Basophils Absolute: 0.1 10*3/uL (ref 0.0–0.2)
Basos: 1 %
EOS (ABSOLUTE): 0.3 10*3/uL (ref 0.0–0.4)
Eos: 3 %
Hematocrit: 42.6 % (ref 34.0–46.6)
Hemoglobin: 14.4 g/dL (ref 11.1–15.9)
Immature Grans (Abs): 0.1 10*3/uL (ref 0.0–0.1)
Immature Granulocytes: 1 %
Lymphocytes Absolute: 2.6 10*3/uL (ref 0.7–3.1)
Lymphs: 28 %
MCH: 30.4 pg (ref 26.6–33.0)
MCHC: 33.8 g/dL (ref 31.5–35.7)
MCV: 90 fL (ref 79–97)
Monocytes Absolute: 1 10*3/uL — ABNORMAL HIGH (ref 0.1–0.9)
Monocytes: 10 %
Neutrophils Absolute: 5.2 10*3/uL (ref 1.4–7.0)
Neutrophils: 57 %
Platelets: 333 10*3/uL (ref 150–450)
RBC: 4.73 x10E6/uL (ref 3.77–5.28)
RDW: 12.7 % (ref 11.7–15.4)
WBC: 9.2 10*3/uL (ref 3.4–10.8)

## 2020-07-18 LAB — TSH: TSH: 7.69 u[IU]/mL — ABNORMAL HIGH (ref 0.450–4.500)

## 2020-07-21 ENCOUNTER — Other Ambulatory Visit: Payer: Self-pay | Admitting: Family

## 2020-07-21 MED ORDER — LEVOTHYROXINE SODIUM 100 MCG PO TABS: 100.0000 ug | ORAL_TABLET | Freq: Every day | ORAL | 1 refills | Status: DC

## 2020-07-29 ENCOUNTER — Ambulatory Visit (INDEPENDENT_AMBULATORY_CARE_PROVIDER_SITE_OTHER): Payer: PPO | Admitting: Family Medicine

## 2020-07-29 ENCOUNTER — Encounter: Payer: Self-pay | Admitting: Family Medicine

## 2020-07-29 DIAGNOSIS — J069 Acute upper respiratory infection, unspecified: Secondary | ICD-10-CM

## 2020-07-29 DIAGNOSIS — R059 Cough, unspecified: Secondary | ICD-10-CM

## 2020-07-29 NOTE — Progress Notes (Signed)
Virtual Visit via Telephone Note  I connected with Autumn Moyer on 07/29/20 at 4:40 PM by telephone and verified that I am speaking with the correct person using two identifiers. Autumn Moyer is currently located at home and her husband is currently with her during this visit. The provider, Loman Brooklyn, FNP is located in their office at time of visit.  I discussed the limitations, risks, security and privacy concerns of performing an evaluation and management service by telephone and the availability of in person appointments. I also discussed with the patient that there may be a patient responsible charge related to this service. The patient expressed understanding and agreed to proceed.  Subjective: PCP: Sharion Balloon, FNP  Chief Complaint  Patient presents with  . URI   Patient complains of a dry cough, headache and sinus pressure. Onset of symptoms was last night. She is drinking plenty of fluids. Evaluation to date: none. Treatment to date: antihistamines. She has a history of allergies. She does not smoke.    ROS: Per HPI  Current Outpatient Medications:  .  acetaminophen (TYLENOL) 325 MG tablet, Take 650 mg by mouth every 6 (six) hours as needed., Disp: , Rfl:  .  apixaban (ELIQUIS) 5 MG TABS tablet, Take 1 tablet (5 mg total) by mouth 2 (two) times daily., Disp: 60 tablet, Rfl: 1 .  benazepril-hydrochlorthiazide (LOTENSIN HCT) 20-25 MG tablet, Take 1 tablet by mouth daily., Disp: 90 tablet, Rfl: 1 .  furosemide (LASIX) 20 MG tablet, Take 1 tablet (20 mg total) by mouth daily. (Patient not taking: Reported on 07/17/2020), Disp: 90 tablet, Rfl: 3 .  levocetirizine (XYZAL) 5 MG tablet, TAKE 1 TABLET BY MOUTH ONCE DAILY IN THE EVENING, Disp: 90 tablet, Rfl: 1 .  levothyroxine (SYNTHROID) 100 MCG tablet, Take 1 tablet (100 mcg total) by mouth daily., Disp: 90 tablet, Rfl: 1 .  Lysine 500 MG TABS, Take by mouth 2 (two) times daily. , Disp: , Rfl:  .  metoprolol tartrate  (LOPRESSOR) 25 MG tablet, Take 1 tablet (25 mg total) by mouth 2 (two) times daily., Disp: 180 tablet, Rfl: 3 .  mometasone (ELOCON) 0.1 % cream, Apply 1 application topically daily., Disp: 45 g, Rfl: 2 .  Multiple Vitamin (MULTIVITAMIN) capsule, Take 1 capsule by mouth daily., Disp: , Rfl:  .  Omega-3 Fatty Acids (FISH OIL) 500 MG CAPS, Take 1,000 mg by mouth daily., Disp: , Rfl:  .  pantoprazole (PROTONIX) 40 MG tablet, Take 1 tablet (40 mg total) by mouth daily., Disp: 90 tablet, Rfl: 1 .  sodium chloride (OCEAN) 0.65 % SOLN nasal spray, Place 1 spray into both nostrils as needed for congestion., Disp: 60 mL, Rfl: 0 .  tacrolimus (PROTOPIC) 0.1 % ointment, Apply topically in the morning and at bedtime., Disp: 30 g, Rfl: 4  Allergies  Allergen Reactions  . Noroxin [Norfloxacin] Nausea And Vomiting   Past Medical History:  Diagnosis Date  . Atrial fibrillation ([place])   . Hypertension   . Squamous cell carcinoma of skin 10/23/2013   in situ- left forearm (CX35FU)  . Squamous cell carcinoma of skin 01/16/2018   in situ-right knee post (txpbx)  . Thyroid disease     Observations/Objective: A&O  No respiratory distress or wheezing audible over the phone Mood, judgement, and thought processes all WNL  Assessment and Plan: 1. Viral URI Discussed symptom management. Encouraged use of Flonase with her Xyzal.   2. Cough - Novel Coronavirus, NAA ([facility]);  Future - Veritor Flu A/B Waived; Future   Follow Up Instructions:  I discussed the assessment and treatment plan with the patient. The patient was provided an opportunity to ask questions and all were answered. The patient agreed with the plan and demonstrated an understanding of the instructions.   The patient was advised to call back or seek an in-person evaluation if the symptoms worsen or if the condition fails to improve as anticipated.  The above assessment and management plan was discussed with the patient. The patient  verbalized understanding of and has agreed to the management plan. Patient is aware to call the clinic if symptoms persist or worsen. Patient is aware when to return to the clinic for a follow-up visit. Patient educated on when it is appropriate to go to the emergency department.   Time call ended: 4:51 PM  I provided 11 minutes of non-face-to-face time during this encounter.  Hendricks Limes, MSN, APRN, FNP-C [facility] 07/29/20

## 2020-07-30 DIAGNOSIS — R059 Cough, unspecified: Secondary | ICD-10-CM | POA: Diagnosis not present

## 2020-07-30 LAB — VERITOR FLU A/B WAIVED
Influenza A: NEGATIVE
Influenza B: NEGATIVE

## 2020-07-30 NOTE — Addendum Note (Signed)
Addended by: Liliane Bade on: 07/30/2020 09:12 AM   Modules accepted: Orders

## 2020-07-31 ENCOUNTER — Ambulatory Visit (INDEPENDENT_AMBULATORY_CARE_PROVIDER_SITE_OTHER): Payer: PPO | Admitting: Family Medicine

## 2020-07-31 DIAGNOSIS — U071 COVID-19: Secondary | ICD-10-CM

## 2020-07-31 LAB — SARS-COV-2, NAA 2 DAY TAT

## 2020-07-31 LAB — NOVEL CORONAVIRUS, NAA: SARS-CoV-2, NAA: DETECTED — AB

## 2020-07-31 MED ORDER — MOLNUPIRAVIR EUA 200MG CAPSULE: 4.0000 | ORAL_CAPSULE | Freq: Two times a day (BID) | ORAL | 0 refills | Status: DC

## 2020-07-31 MED ORDER — BENZONATATE 100 MG PO CAPS: 100.0000 mg | ORAL_CAPSULE | Freq: Three times a day (TID) | ORAL | 0 refills | Status: DC | PRN

## 2020-07-31 MED ORDER — MOLNUPIRAVIR EUA 200MG CAPSULE: 4.0000 | ORAL_CAPSULE | Freq: Two times a day (BID) | ORAL | 0 refills | Status: AC

## 2020-07-31 NOTE — Progress Notes (Signed)
Telephone visit  Subjective: CC: COVID infection PCP: Sharion Balloon, FNP ATF:TDDUK KINDELL STRADA is a 80 y.o. female calls for telephone consult today. Patient provides verbal consent for consult held via phone.  Due to COVID-19 pandemic this visit was conducted virtually. This visit type was conducted due to national recommendations for restrictions regarding the COVID-19 Pandemic (e.g. social distancing, sheltering in place) in an effort to limit this patient's exposure and mitigate transmission in our community. All issues noted in this document were discussed and addressed.  A physical exam was not performed with this format.   Location of patient: home Location of provider: [facility] Others present for call: none  1. COVID infection Patient reports nasal congestion, dry cough.  She is using Flonase, isolating.  No diarrhea, nausea or vomiting.  She is able to hydrate without difficulty.  Diagnosed with COVID-19 yesterday.  Onset of symptoms Tuesday.  Medical history is significant for atrial fibrillation, aortic stenosis, hypertension and hypothyroidism.   ROS: Per HPI  Allergies  Allergen Reactions  . Noroxin [Norfloxacin] Nausea And Vomiting   Past Medical History:  Diagnosis Date  . Atrial fibrillation ([place])   . Hypertension   . Squamous cell carcinoma of skin 10/23/2013   in situ- left forearm (CX35FU)  . Squamous cell carcinoma of skin 01/16/2018   in situ-right knee post (txpbx)  . Thyroid disease     Current Outpatient Medications:  .  acetaminophen (TYLENOL) 325 MG tablet, Take 650 mg by mouth every 6 (six) hours as needed., Disp: , Rfl:  .  apixaban (ELIQUIS) 5 MG TABS tablet, Take 1 tablet (5 mg total) by mouth 2 (two) times daily., Disp: 60 tablet, Rfl: 1 .  benazepril-hydrochlorthiazide (LOTENSIN HCT) 20-25 MG tablet, Take 1 tablet by mouth daily., Disp: 90 tablet, Rfl: 1 .  furosemide (LASIX) 20 MG tablet, Take 1 tablet (20 mg total) by mouth daily. (Patient not  taking: Reported on 07/17/2020), Disp: 90 tablet, Rfl: 3 .  levocetirizine (XYZAL) 5 MG tablet, TAKE 1 TABLET BY MOUTH ONCE DAILY IN THE EVENING, Disp: 90 tablet, Rfl: 1 .  levothyroxine (SYNTHROID) 100 MCG tablet, Take 1 tablet (100 mcg total) by mouth daily., Disp: 90 tablet, Rfl: 1 .  Lysine 500 MG TABS, Take by mouth 2 (two) times daily. , Disp: , Rfl:  .  metoprolol tartrate (LOPRESSOR) 25 MG tablet, Take 1 tablet (25 mg total) by mouth 2 (two) times daily., Disp: 180 tablet, Rfl: 3 .  mometasone (ELOCON) 0.1 % cream, Apply 1 application topically daily., Disp: 45 g, Rfl: 2 .  Multiple Vitamin (MULTIVITAMIN) capsule, Take 1 capsule by mouth daily., Disp: , Rfl:  .  Omega-3 Fatty Acids (FISH OIL) 500 MG CAPS, Take 1,000 mg by mouth daily., Disp: , Rfl:  .  pantoprazole (PROTONIX) 40 MG tablet, Take 1 tablet (40 mg total) by mouth daily., Disp: 90 tablet, Rfl: 1 .  sodium chloride (OCEAN) 0.65 % SOLN nasal spray, Place 1 spray into both nostrils as needed for congestion., Disp: 60 mL, Rfl: 0 .  tacrolimus (PROTOPIC) 0.1 % ointment, Apply topically in the morning and at bedtime., Disp: 30 g, Rfl: 4  Assessment/ Plan: 80 y.o. female   COVID-19 - Plan: benzonatate (TESSALON PERLES) 100 MG capsule, molnupiravir EUA 200 mg CAPS  Patient with positive COVID-19 infection detected yesterday.  Onset of symptoms were Tuesday.  She is well within the period of initiation of antiviral and meets high risk criteria.  I have ordered medications  and given her home care instructions and reasons for emergent evaluation.  She voiced good understanding will follow up as needed  Start time: 4:51pm End time: 4:57pm  Total time spent on patient care (including telephone call/ virtual visit): 6 minutes  [place], [place] [phone]

## 2020-08-01 ENCOUNTER — Telehealth: Payer: Self-pay | Admitting: Physician Assistant

## 2020-08-01 NOTE — Telephone Encounter (Signed)
Called to discuss with patient about Covid symptoms and the use of a monoclonal antibody infusion for those with mild to moderate Covid symptoms and at a high risk of hospitalization.   Patient reports she is already treated by local doctor and not interested in any treatment. Did not provider any history and hung up.    Autumn Moyer, [place]  08/01/2020 10:36 AM

## 2020-09-07 ENCOUNTER — Other Ambulatory Visit: Payer: Self-pay | Admitting: Family

## 2020-09-07 DIAGNOSIS — I4891 Unspecified atrial fibrillation: Secondary | ICD-10-CM

## 2020-09-22 ENCOUNTER — Encounter: Payer: Self-pay | Admitting: Family

## 2020-09-22 ENCOUNTER — Other Ambulatory Visit: Payer: Self-pay

## 2020-09-22 ENCOUNTER — Ambulatory Visit (INDEPENDENT_AMBULATORY_CARE_PROVIDER_SITE_OTHER): Payer: PPO | Admitting: Family

## 2020-09-22 VITALS — BP 105/60 | HR 77 | Temp 97.7°F | Ht 64.0 in | Wt 169.6 lb

## 2020-09-22 DIAGNOSIS — I4891 Unspecified atrial fibrillation: Secondary | ICD-10-CM | POA: Diagnosis not present

## 2020-09-22 DIAGNOSIS — J019 Acute sinusitis, unspecified: Secondary | ICD-10-CM | POA: Diagnosis not present

## 2020-09-22 DIAGNOSIS — E039 Hypothyroidism, unspecified: Secondary | ICD-10-CM | POA: Diagnosis not present

## 2020-09-22 DIAGNOSIS — E669 Obesity, unspecified: Secondary | ICD-10-CM | POA: Diagnosis not present

## 2020-09-22 DIAGNOSIS — Z23 Encounter for immunization: Secondary | ICD-10-CM

## 2020-09-22 DIAGNOSIS — J309 Allergic rhinitis, unspecified: Secondary | ICD-10-CM | POA: Diagnosis not present

## 2020-09-22 DIAGNOSIS — I480 Paroxysmal atrial fibrillation: Secondary | ICD-10-CM

## 2020-09-22 DIAGNOSIS — I10 Essential (primary) hypertension: Secondary | ICD-10-CM | POA: Diagnosis not present

## 2020-09-22 DIAGNOSIS — D509 Iron deficiency anemia, unspecified: Secondary | ICD-10-CM

## 2020-09-22 DIAGNOSIS — I350 Nonrheumatic aortic (valve) stenosis: Secondary | ICD-10-CM

## 2020-09-22 MED ORDER — AMOXICILLIN-POT CLAVULANATE 875-125 MG PO TABS: 1.0000 | ORAL_TABLET | Freq: Two times a day (BID) | ORAL | 0 refills | Status: DC

## 2020-09-22 MED ORDER — FLUTICASONE PROPIONATE 50 MCG/ACT NA SUSP: 2.0000 | Freq: Every day | NASAL | 6 refills | Status: DC

## 2020-09-22 MED ORDER — APIXABAN 5 MG PO TABS: 5.0000 mg | ORAL_TABLET | Freq: Two times a day (BID) | ORAL | 0 refills | Status: DC

## 2020-09-22 MED ORDER — LEVOCETIRIZINE DIHYDROCHLORIDE 5 MG PO TABS: 5.0000 mg | ORAL_TABLET | Freq: Every evening | ORAL | 1 refills | Status: DC

## 2020-09-22 NOTE — Progress Notes (Signed)
Subjective:    Patient ID: Autumn Moyer, female    DOB: Apr 25, 1940, 80 y.o.   MRN: [id number]  Chief Complaint  Patient presents with   Hypothyroidism    2 MTH    Sinus Problem    Since COVID    Pt presents to the offic today for chronic follow up. She is followed by Cardiologists  for A Fib, Aortic stenosis and gets an Echo each year. She is currently on Eliquis 5 mg BID.  Sinus Problem This is a new problem. The current episode started 1 to 4 weeks ago. The problem has been gradually worsening since onset. There has been no fever. Her pain is at a severity of 1/10. The pain is mild. Associated symptoms include congestion, a hoarse voice, sinus pressure, sneezing and a sore throat. Pertinent negatives include no chills, diaphoresis, headaches or shortness of breath. Past treatments include oral decongestants. The treatment provided mild relief.  Thyroid Problem Presents for follow-up visit. Symptoms include diarrhea, hair loss (improved) and hoarse voice. Patient reports no diaphoresis or fatigue. The symptoms have been stable.  Hypertension This is a chronic problem. The current episode started more than 1 year ago. The problem has been resolved since onset. The problem is controlled. Pertinent negatives include no headaches, malaise/fatigue, peripheral edema or shortness of breath. Risk factors for coronary artery disease include dyslipidemia, obesity and sedentary lifestyle. The current treatment provides moderate improvement. Identifiable causes of hypertension include a thyroid problem.     Review of Systems  Constitutional:  Negative for chills, diaphoresis, fatigue and malaise/fatigue.  HENT:  Positive for congestion, hoarse voice, sinus pressure, sneezing and sore throat.   Respiratory:  Negative for shortness of breath.   Gastrointestinal:  Positive for diarrhea.  Neurological:  Negative for headaches.  All other systems reviewed and are negative.     Objective:    Physical Exam Vitals reviewed.  Constitutional:      General: She is not in acute distress.    Appearance: She is well-developed.  HENT:     Head: Normocephalic and atraumatic.     Right Ear: Tympanic membrane normal.     Left Ear: Tympanic membrane normal.  Eyes:     Pupils: Pupils are equal, round, and reactive to light.  Neck:     Thyroid: No thyromegaly.  Cardiovascular:     Rate and Rhythm: Normal rate and regular rhythm.     Heart sounds: Murmur heard.  Pulmonary:     Effort: Pulmonary effort is normal. No respiratory distress.     Breath sounds: Normal breath sounds. No wheezing.  Abdominal:     General: Bowel sounds are normal. There is no distension.     Palpations: Abdomen is soft.     Tenderness: There is no abdominal tenderness.  Musculoskeletal:        General: No tenderness. Normal range of motion.     Cervical back: Normal range of motion and neck supple.  Skin:    General: Skin is warm and dry.  Neurological:     Mental Status: She is alert and oriented to person, place, and time.     Cranial Nerves: No cranial nerve deficit.     Deep Tendon Reflexes: Reflexes are normal and symmetric.  Psychiatric:        Behavior: Behavior normal.        Thought Content: Thought content normal.        Judgment: Judgment normal.     BP 105/60  Pulse 77   Temp 97.7 F (36.5 C) (Temporal)   Ht 5' 4"  (1.626 m)   Wt 169 lb 9.6 oz (76.9 kg)   BMI 29.11 kg/m       Assessment & Plan:  Autumn Moyer comes in today with chief complaint of Hypothyroidism (2 MTH ) and Sinus Problem (Since COVID/)   Diagnosis and orders addressed:  1. Allergic rhinitis, unspecified seasonality, unspecified trigger - levocetirizine (XYZAL) 5 MG tablet; Take 1 tablet (5 mg total) by mouth every evening.  Dispense: 90 tablet; Refill: 1 - CBC with Differential/Platelet - CMP14+EGFR  2. New onset atrial fibrillation (HCC) - apixaban (ELIQUIS) 5 MG TABS tablet; Take 1 tablet (5 mg total)  by mouth 2 (two) times daily.  Dispense: 60 tablet; Refill: 0 - CBC with Differential/Platelet - CMP14+EGFR  3. Aortic valve stenosis, etiology of cardiac valve disease unspecified - CBC with Differential/Platelet - CMP14+EGFR  4. Essential hypertension, benign - CBC with Differential/Platelet - CMP14+EGFR  5. Paroxysmal atrial fibrillation (HCC) - CBC with Differential/Platelet - CMP14+EGFR  6. Hypothyroidism, unspecified type - CBC with Differential/Platelet - CMP14+EGFR - TSH  7. Obesity (BMI 30.0-34.9) - CBC with Differential/Platelet - CMP14+EGFR  8. Iron deficiency anemia, unspecified iron deficiency anemia type - CBC with Differential/Platelet - CMP14+EGFR  9. Acute sinusitis, recurrence not specified, unspecified location - amoxicillin-clavulanate (AUGMENTIN) 875-125 MG tablet; Take 1 tablet by mouth 2 (two) times daily.  Dispense: 14 tablet; Refill: 0 - fluticasone (FLONASE) 50 MCG/ACT nasal spray; Place 2 sprays into both nostrils daily.  Dispense: 16 g; Refill: 6   Labs pending Health Maintenance reviewed Diet and exercise encouraged  Follow up plan: 6 months    Evelina Dun, FNP

## 2020-09-22 NOTE — Patient Instructions (Signed)

## 2020-09-23 LAB — CMP14+EGFR
ALT: 16 IU/L (ref 0–32)
AST: 16 IU/L (ref 0–40)
Albumin/Globulin Ratio: 1.9 (ref 1.2–2.2)
Albumin: 4.4 g/dL (ref 3.7–4.7)
Alkaline Phosphatase: 51 IU/L (ref 44–121)
BUN/Creatinine Ratio: 20 (ref 12–28)
BUN: 19 mg/dL (ref 8–27)
Bilirubin Total: 1.5 mg/dL — ABNORMAL HIGH (ref 0.0–1.2)
CO2: 24 mmol/L (ref 20–29)
Calcium: 9.6 mg/dL (ref 8.7–10.3)
Chloride: 104 mmol/L (ref 96–106)
Creatinine, Ser: 0.93 mg/dL (ref 0.57–1.00)
Globulin, Total: 2.3 g/dL (ref 1.5–4.5)
Glucose: 96 mg/dL (ref 65–99)
Potassium: 3.9 mmol/L (ref 3.5–5.2)
Sodium: 142 mmol/L (ref 134–144)
Total Protein: 6.7 g/dL (ref 6.0–8.5)
eGFR: 63 mL/min/{1.73_m2} (ref >59)

## 2020-09-23 LAB — CBC WITH DIFFERENTIAL/PLATELET
Basophils Absolute: 0.1 10*3/uL (ref 0.0–0.2)
Basos: 2 %
EOS (ABSOLUTE): 0.3 10*3/uL (ref 0.0–0.4)
Eos: 4 %
Hematocrit: 39.7 % (ref 34.0–46.6)
Hemoglobin: 13.4 g/dL (ref 11.1–15.9)
Immature Grans (Abs): 0 10*3/uL (ref 0.0–0.1)
Immature Granulocytes: 0 %
Lymphocytes Absolute: 2.4 10*3/uL (ref 0.7–3.1)
Lymphs: 41 %
MCH: 30.4 pg (ref 26.6–33.0)
MCHC: 33.8 g/dL (ref 31.5–35.7)
MCV: 90 fL (ref 79–97)
Monocytes Absolute: 0.6 10*3/uL (ref 0.1–0.9)
Monocytes: 10 %
Neutrophils Absolute: 2.6 10*3/uL (ref 1.4–7.0)
Neutrophils: 43 %
Platelets: 243 10*3/uL (ref 150–450)
RBC: 4.41 x10E6/uL (ref 3.77–5.28)
RDW: 12.9 % (ref 11.7–15.4)
WBC: 5.9 10*3/uL (ref 3.4–10.8)

## 2020-09-23 LAB — TSH: TSH: 0.301 u[IU]/mL — ABNORMAL LOW (ref 0.450–4.500)

## 2020-09-24 ENCOUNTER — Other Ambulatory Visit: Payer: Self-pay | Admitting: Family

## 2020-09-24 MED ORDER — LEVOTHYROXINE SODIUM 88 MCG PO TABS: 88.0000 ug | ORAL_TABLET | Freq: Every day | ORAL | 3 refills | Status: DC

## 2020-09-25 ENCOUNTER — Telehealth: Payer: Self-pay

## 2020-09-25 MED ORDER — DOXYCYCLINE HYCLATE 100 MG PO TABS: 100.0000 mg | ORAL_TABLET | Freq: Two times a day (BID) | ORAL | 0 refills | Status: DC

## 2020-09-25 NOTE — Telephone Encounter (Signed)
Patient was prescribed Augmentin on Tuesday, started medication Wednesday morning.  Wednesday evening she noticed some sensitivity in her gums, she took two on Thursday and noted swelling in her gums.  She has not taken any this morning but still has swelling in gums and her lips feel "different", patient describes as numb.  She thinks she may be allergic to the Augmentin and would like to know if you think she should take another antibiotic instead.  Patient's pharmacy is [place].

## 2020-09-25 NOTE — Telephone Encounter (Signed)
Patient aware.

## 2020-09-25 NOTE — Telephone Encounter (Signed)
Stop Augmentin. I have sent doxycyline to your pharmacy.

## 2020-10-05 ENCOUNTER — Other Ambulatory Visit: Payer: Self-pay | Admitting: Family

## 2020-10-05 DIAGNOSIS — Z1231 Encounter for screening mammogram for malignant neoplasm of breast: Secondary | ICD-10-CM

## 2020-10-06 ENCOUNTER — Ambulatory Visit: Payer: PPO | Admitting: Physician Assistant

## 2020-10-06 ENCOUNTER — Other Ambulatory Visit: Payer: Self-pay

## 2020-10-06 ENCOUNTER — Encounter: Payer: Self-pay | Admitting: Physician Assistant

## 2020-10-06 DIAGNOSIS — Z1283 Encounter for screening for malignant neoplasm of skin: Secondary | ICD-10-CM

## 2020-10-06 DIAGNOSIS — L821 Other seborrheic keratosis: Secondary | ICD-10-CM | POA: Diagnosis not present

## 2020-10-06 DIAGNOSIS — L570 Actinic keratosis: Secondary | ICD-10-CM | POA: Diagnosis not present

## 2020-10-06 DIAGNOSIS — Z85828 Personal history of other malignant neoplasm of skin: Secondary | ICD-10-CM | POA: Diagnosis not present

## 2020-10-06 NOTE — Progress Notes (Signed)
   Follow-Up Visit   Subjective  Autumn Moyer is a 80 y.o. female who presents for the following: Annual Exam (Patient here today for yearly skin check. Per patient she has a lesion on her right shoulder x years that's itching, no bleeding.  Check dry scaly area in front of her right ear x 4-5 months comes and goes. Personal history of non mole skin cancer. No family history of atypical moles, melanoma or non mole skin cancer. ).   The following portions of the chart were reviewed this encounter and updated as appropriate:  Tobacco  Allergies  Meds  Problems  Med Hx  Surg Hx  Fam Hx      Objective  Well appearing patient in no apparent distress; mood and affect are within normal limits.  A full examination was performed including scalp, head, eyes, ears, nose, lips, neck, chest, axillae, abdomen, back, buttocks, bilateral upper extremities, bilateral lower extremities, hands, feet, fingers, toes, fingernails, and toenails. All findings within normal limits unless otherwise noted below.  Left Forearm - Anterior, Right Breast, Right Forearm - Anterior, Right Preauricular Area, Right Tip of Nose Erythematous patches with gritty scale.   Assessment & Plan  AK (actinic keratosis) (5) Left Forearm - Anterior; Right Forearm - Anterior; Right Breast; Right Preauricular Area; Right Tip of Nose  Destruction of lesion - Left Forearm - Anterior, Right Breast, Right Forearm - Anterior, Right Preauricular Area, Right Tip of Nose Complexity: simple   Destruction method: cryotherapy   Informed consent: discussed and consent obtained   Timeout:  patient name, date of birth, surgical site, and procedure verified Lesion destroyed using liquid nitrogen: Yes   Cryotherapy cycles:  3 Outcome: patient tolerated procedure well with no complications    Seborrheic Keratoses - right shoulder, scattered over entire body - Stuck-on, waxy, tan-brown papules and plaques  - Discussed benign etiology and  prognosis. - Observe - Call for any changes       I, Imraan Wendell, PA-C, have reviewed all documentation's for this visit.  The documentation on 10/06/20 for the exam, diagnosis, procedures and orders are all accurate and complete.

## 2020-10-12 ENCOUNTER — Telehealth: Payer: Self-pay | Admitting: Family

## 2020-10-12 NOTE — Telephone Encounter (Signed)
Pt states, no signs or symptoms of Covid. Told pt it is not necessary to test unless she develops symptoms.

## 2020-10-16 ENCOUNTER — Ambulatory Visit: Payer: PPO

## 2020-11-16 ENCOUNTER — Ambulatory Visit: Payer: PPO | Admitting: Family

## 2020-11-19 DIAGNOSIS — Z23 Encounter for immunization: Secondary | ICD-10-CM | POA: Diagnosis not present

## 2020-11-26 ENCOUNTER — Ambulatory Visit
Admission: RE | Admit: 2020-11-26 | Discharge: 2020-11-26 | Disposition: A | Discharge status: Home / Self Care | Payer: PPO | Source: Ambulatory Visit | Attending: Family | Admitting: Family

## 2020-11-26 ENCOUNTER — Other Ambulatory Visit: Payer: Self-pay

## 2020-11-26 DIAGNOSIS — Z1231 Encounter for screening mammogram for malignant neoplasm of breast: Secondary | ICD-10-CM

## 2020-11-27 ENCOUNTER — Encounter: Payer: Self-pay | Admitting: Family

## 2020-11-27 ENCOUNTER — Ambulatory Visit (INDEPENDENT_AMBULATORY_CARE_PROVIDER_SITE_OTHER): Payer: PPO | Admitting: Family

## 2020-11-27 VITALS — BP 140/83 | HR 72 | Temp 98.2°F | Ht <= 58 in | Wt 168.6 lb

## 2020-11-27 DIAGNOSIS — E039 Hypothyroidism, unspecified: Secondary | ICD-10-CM | POA: Diagnosis not present

## 2020-11-27 DIAGNOSIS — K2100 Gastro-esophageal reflux disease with esophagitis, without bleeding: Secondary | ICD-10-CM

## 2020-11-27 DIAGNOSIS — D509 Iron deficiency anemia, unspecified: Secondary | ICD-10-CM

## 2020-11-27 DIAGNOSIS — I480 Paroxysmal atrial fibrillation: Secondary | ICD-10-CM | POA: Diagnosis not present

## 2020-11-27 DIAGNOSIS — Z780 Asymptomatic menopausal state: Secondary | ICD-10-CM

## 2020-11-27 DIAGNOSIS — I10 Essential (primary) hypertension: Secondary | ICD-10-CM

## 2020-11-27 DIAGNOSIS — E669 Obesity, unspecified: Secondary | ICD-10-CM | POA: Diagnosis not present

## 2020-11-27 DIAGNOSIS — I350 Nonrheumatic aortic (valve) stenosis: Secondary | ICD-10-CM | POA: Diagnosis not present

## 2020-11-27 DIAGNOSIS — E66811 Obesity, class 1: Secondary | ICD-10-CM

## 2020-11-27 NOTE — Progress Notes (Signed)
Subjective:    Patient ID: Autumn Moyer, female    DOB: [birth year], 80 y.o.   MRN: [id number]  Chief Complaint  Patient presents with   Hypertension   Leg Problem    Right leg gets a little tingle in same spot for the past 3 weeks    Pt presents to the offic today for chronic follow up. She is followed by Cardiologists  for A Fib, Aortic stenosis and gets an Echo each year. She is currently on Eliquis 5 mg BID.  Hypertension This is a chronic problem. The current episode started more than 1 year ago. The problem has been resolved since onset. The problem is controlled. Pertinent negatives include no malaise/fatigue, peripheral edema or shortness of breath. Risk factors for coronary artery disease include dyslipidemia, obesity and sedentary lifestyle. The current treatment provides moderate improvement. Identifiable causes of hypertension include a thyroid problem.  Anemia Presents for follow-up visit. There has been no leg swelling or malaise/fatigue.  Gastroesophageal Reflux She complains of belching and heartburn. She reports no hoarse voice. This is a chronic problem. The problem occurs occasionally. She has tried a PPI for the symptoms. The treatment provided moderate relief.  Thyroid Problem Presents for follow-up visit. Symptoms include heat intolerance. Patient reports no depressed mood, diaphoresis, dry skin or hoarse voice. The symptoms have been stable.     Review of Systems  Constitutional:  Negative for diaphoresis and malaise/fatigue.  HENT:  Negative for hoarse voice.   Respiratory:  Negative for shortness of breath.   Gastrointestinal:  Positive for heartburn.  Endocrine: Positive for heat intolerance.  All other systems reviewed and are negative.     Objective:   Physical Exam Vitals reviewed.  Constitutional:      General: She is not in acute distress.    Appearance: She is well-developed.  HENT:     Head: Normocephalic and atraumatic.     Right Ear:  Tympanic membrane normal.     Left Ear: Tympanic membrane normal.  Eyes:     Pupils: Pupils are equal, round, and reactive to light.  Neck:     Thyroid: No thyromegaly.  Cardiovascular:     Rate and Rhythm: Normal rate and regular rhythm.     Heart sounds: Normal heart sounds. No murmur heard. Pulmonary:     Effort: Pulmonary effort is normal. No respiratory distress.     Breath sounds: Normal breath sounds. No wheezing.  Abdominal:     General: Bowel sounds are normal. There is no distension.     Palpations: Abdomen is soft.     Tenderness: There is no abdominal tenderness.  Musculoskeletal:        General: No tenderness. Normal range of motion.     Cervical back: Normal range of motion and neck supple.  Skin:    General: Skin is warm and dry.  Neurological:     Mental Status: She is alert and oriented to person, place, and time.     Cranial Nerves: No cranial nerve deficit.     Deep Tendon Reflexes: Reflexes are normal and symmetric.  Psychiatric:        Behavior: Behavior normal.        Thought Content: Thought content normal.        Judgment: Judgment normal.      BP 140/83   Pulse 72   Temp 98.2 F (36.8 C) (Temporal)   Ht 2' (0.61 m)   Wt 168 lb 9.6 oz (76.5 kg)  HC 64" (162.6 cm)   BMI 205.80 kg/m      Assessment & Plan:  RASHA IBE comes in today with chief complaint of Hypertension and Leg Problem (Right leg gets a little tingle in same spot for the past 3 weeks )   Diagnosis and orders addressed:  1. Paroxysmal atrial fibrillation (HCC) - CMP14+EGFR  2. Essential hypertension, benign - CMP14+EGFR  3. Aortic valve stenosis, etiology of cardiac valve disease unspecified - CMP14+EGFR  4. Gastroesophageal reflux disease with esophagitis, unspecified whether hemorrhage - CMP14+EGFR  5. Hypothyroidism, unspecified type - TSH - CMP14+EGFR  6. Post-menopausal - CMP14+EGFR  7. Iron deficiency anemia, unspecified iron deficiency anemia type -  CMP14+EGFR  8. Obesity (BMI 30.0-34.9) - CMP14+EGFR   Labs pending Health Maintenance reviewed Diet and exercise encouraged  Follow up plan: 6 months    Evelina Dun, FNP

## 2020-11-27 NOTE — Patient Instructions (Signed)

## 2020-11-28 LAB — CMP14+EGFR
ALT: 15 IU/L (ref 0–32)
AST: 19 IU/L (ref 0–40)
Albumin/Globulin Ratio: 2.1 (ref 1.2–2.2)
Albumin: 4.5 g/dL (ref 3.7–4.7)
Alkaline Phosphatase: 48 IU/L (ref 44–121)
BUN/Creatinine Ratio: 18 (ref 12–28)
BUN: 17 mg/dL (ref 8–27)
Bilirubin Total: 1.2 mg/dL (ref 0.0–1.2)
CO2: 23 mmol/L (ref 20–29)
Calcium: 9.4 mg/dL (ref 8.7–10.3)
Chloride: 101 mmol/L (ref 96–106)
Creatinine, Ser: 0.95 mg/dL (ref 0.57–1.00)
Globulin, Total: 2.1 g/dL (ref 1.5–4.5)
Glucose: 100 mg/dL — ABNORMAL HIGH (ref 65–99)
Potassium: 4.1 mmol/L (ref 3.5–5.2)
Sodium: 139 mmol/L (ref 134–144)
Total Protein: 6.6 g/dL (ref 6.0–8.5)
eGFR: 61 mL/min/{1.73_m2} (ref >59)

## 2020-11-28 LAB — TSH: TSH: 0.837 u[IU]/mL (ref 0.450–4.500)

## 2020-12-23 DIAGNOSIS — H0100A Unspecified blepharitis right eye, upper and lower eyelids: Secondary | ICD-10-CM | POA: Diagnosis not present

## 2020-12-23 DIAGNOSIS — H524 Presbyopia: Secondary | ICD-10-CM | POA: Diagnosis not present

## 2020-12-23 DIAGNOSIS — H04123 Dry eye syndrome of bilateral lacrimal glands: Secondary | ICD-10-CM | POA: Diagnosis not present

## 2020-12-23 DIAGNOSIS — D3131 Benign neoplasm of right choroid: Secondary | ICD-10-CM | POA: Diagnosis not present

## 2021-01-11 ENCOUNTER — Other Ambulatory Visit: Payer: Self-pay | Admitting: Family

## 2021-01-11 DIAGNOSIS — I4891 Unspecified atrial fibrillation: Secondary | ICD-10-CM

## 2021-01-28 ENCOUNTER — Telehealth: Payer: Self-pay | Admitting: Family

## 2021-01-28 NOTE — Telephone Encounter (Signed)
Left message for patient to call back and schedule Medicare Annual Wellness Visit (AWV) either virtually or phone I left my number for patient to call [phone].  due 10/19/2009 awvi per [place]  please schedule at anytime with health coach  This should be a 45 minute visit.

## 2021-02-03 ENCOUNTER — Ambulatory Visit (INDEPENDENT_AMBULATORY_CARE_PROVIDER_SITE_OTHER): Payer: PPO

## 2021-02-03 VITALS — Ht 64.0 in | Wt 168.0 lb

## 2021-02-03 DIAGNOSIS — Z0000 Encounter for general adult medical examination without abnormal findings: Secondary | ICD-10-CM | POA: Diagnosis not present

## 2021-02-03 NOTE — Progress Notes (Signed)
Subjective:   Autumn Moyer is a 80 y.o. female who presents for an Initial Medicare Annual Wellness Visit.Virtual Visit via Telephone Note  I connected with  Autumn Moyer on 02/03/21 at  4:15 PM EST by telephone and verified that I am speaking with the correct person using two identifiers.  Location: Patient: Home Provider: [facility] Persons participating in the virtual visit: patient/Nurse Health Advisor   I discussed the limitations, risks, security and privacy concerns of performing an evaluation and management service by telephone and the availability of in person appointments. The patient expressed understanding and agreed to proceed.  Interactive audio and video telecommunications were attempted between this nurse and patient, however failed, due to patient having technical difficulties OR patient did not have access to video capability.  We continued and completed visit with audio only.  Some vital signs may be absent or patient reported.   Chriss Driver, LPN  Review of Systems     Cardiac Risk Factors include: advanced age (>42men, >65 women);hypertension;sedentary lifestyle     Objective:    Today's Vitals   02/03/21 1548  Weight: 168 lb (76.2 kg)  Height: 5\' 4"  (1.626 m)   Body mass index is 28.84 kg/m.  Advanced Directives 02/03/2021 05/22/2015  Does Patient Have a Medical Advance Directive? Yes No  Type of Paramedic of [place];Living will -  Copy of [place] in Chart? No - copy requested -  Would patient like information on creating a medical advance directive? No - Patient declined No - patient declined information    Current Medications (verified) Outpatient Encounter Medications as of 02/03/2021  Medication Sig   acetaminophen (TYLENOL) 325 MG tablet Take 650 mg by mouth every 6 (six) hours as needed.   apixaban (ELIQUIS) 5 MG TABS tablet Take 1 tablet (5 mg total) by mouth 2 (two) times daily.    benazepril-hydrochlorthiazide (LOTENSIN HCT) 20-25 MG tablet Take 1 tablet by mouth daily.   doxycycline (VIBRA-TABS) 100 MG tablet Take 1 tablet (100 mg total) by mouth 2 (two) times daily.   fluticasone (FLONASE) 50 MCG/ACT nasal spray Place 2 sprays into both nostrils daily.   FLUZONE HIGH-DOSE QUADRIVALENT 0.7 ML SUSY    levocetirizine (XYZAL) 5 MG tablet Take 1 tablet (5 mg total) by mouth every evening.   levothyroxine (SYNTHROID) 88 MCG tablet Take 1 tablet (88 mcg total) by mouth daily.   Lysine 500 MG TABS Take by mouth 2 (two) times daily.    metoprolol tartrate (LOPRESSOR) 25 MG tablet Take 1 tablet by mouth twice daily   mometasone (ELOCON) 0.1 % cream Apply 1 application topically daily.   Multiple Vitamin (MULTIVITAMIN) capsule Take 1 capsule by mouth daily.   Omega-3 Fatty Acids (FISH OIL) 500 MG CAPS Take 1,000 mg by mouth daily.   pantoprazole (PROTONIX) 40 MG tablet Take 1 tablet (40 mg total) by mouth daily.   SHINGRIX injection    sodium chloride (OCEAN) 0.65 % SOLN nasal spray Place 1 spray into both nostrils as needed for congestion.   tacrolimus (PROTOPIC) 0.1 % ointment Apply topically in the morning and at bedtime.   furosemide (LASIX) 20 MG tablet Take 1 tablet (20 mg total) by mouth daily. (Patient not taking: Reported on 07/17/2020)   No facility-administered encounter medications on file as of 02/03/2021.    Allergies (verified) Noroxin [norfloxacin] and Augmentin [amoxicillin-pot clavulanate]   History: Past Medical History:  Diagnosis Date   Atrial fibrillation ([place])  Hypertension    Squamous cell carcinoma of skin 10/23/2013   in situ- left forearm (CX35FU)   Squamous cell carcinoma of skin 01/16/2018   in situ-right knee post (txpbx)   Thyroid disease    Past Surgical History:  Procedure Laterality Date   EYE SURGERY     TOE SURGERY     TUBAL LIGATION     Family History  Problem Relation Age of Onset   Hypertension Mother    Hyperlipidemia  Mother    Heart failure Mother        Age 22   Hyperlipidemia Father    Hypertension Father    Heart disease Father        died of MI after developoing cancer.   Lung cancer Father    Hypertension Sister    Breast cancer Daughter    Breast cancer Paternal Aunt    Social History   Socioeconomic History   Marital status: Married    Spouse name: Danny   Number of children: 2   Years of education: Not on file   Highest education level: Not on file  Occupational History   Not on file  Tobacco Use   Smoking status: Never   Smokeless tobacco: Never  Vaping Use   Vaping Use: Never used  Substance and Sexual Activity   Alcohol use: No    Alcohol/week: 0.0 standard drinks   Drug use: No   Sexual activity: Yes    Partners: Female  Other Topics Concern   Not on file  Social History Narrative   Lives with husband.  Married x 56 years in 2022.   2 daughters   5 grand children   1 great grandchild.   Social Determinants of Health   Financial Resource Strain: Low Risk    Difficulty of Paying Living Expenses: Not hard at all  Food Insecurity: No Food Insecurity   Worried About Charity fundraiser in the Last Year: Never true   [place] in the Last Year: Never true  Transportation Needs: No Transportation Needs   Lack of Transportation (Medical): No   Lack of Transportation (Non-Medical): No  Physical Activity: Insufficiently Active   Days of Exercise per Week: 3 days   Minutes of Exercise per Session: 20 min  Stress: No Stress Concern Present   Feeling of Stress : Not at all  Social Connections: Socially Integrated   Frequency of Communication with Friends and Family: More than three times a week   Frequency of Social Gatherings with Friends and Family: More than three times a week   Attends Religious Services: More than 4 times per year   Active Member of [employer] or Organizations: Yes   Attends Music therapist: More than 4 times per year   Marital  Status: Married    Tobacco Counseling Counseling given: Not Answered   Clinical Intake:  Pre-visit preparation completed: Yes  Pain : No/denies pain     Nutritional Risks: None Diabetes: No  How often do you need to have someone help you when you read instructions, pamphlets, or other written materials from your doctor or pharmacy?: 1 - Never  Diabetic?No  Interpreter Needed?: No  Information entered by :: MJ Kei Langhorst, LPN   Activities of Daily Living In your present state of health, do you have any difficulty performing the following activities: 02/03/2021  Hearing? N  Vision? N  Difficulty concentrating or making decisions? N  Walking or climbing stairs? N  Dressing  or bathing? N  Doing errands, shopping? N  Preparing Food and eating ? N  Using the Toilet? N  In the past six months, have you accidently leaked urine? N  Do you have problems with loss of bowel control? N  Managing your Medications? N  Managing your Finances? N  Housekeeping or managing your Housekeeping? N  Some recent data might be hidden    Patient Care Team: Sharion Balloon, FNP as PCP - General (Family Medicine)  Indicate any recent Medical Services you may have received from other than [facility] providers in the past year (date may be approximate).     Assessment:   This is a routine wellness examination for [place].  Hearing/Vision screen Hearing Screening - Comments:: Some hearing issues.  Vision Screening - Comments:: Glasses/Readers. Dr. Satira Sark. 11/2020.  Dietary issues and exercise activities discussed: Current Exercise Habits: Home exercise routine, Type of exercise: walking, Time (Minutes): 20, Frequency (Times/Week): 3, Weekly Exercise (Minutes/Week): 60, Intensity: Mild, Exercise limited by: cardiac condition(s)   Goals Addressed             This Visit's Progress    DIET - REDUCE CALORIE INTAKE       Pt states she would like to lose weight.        Depression Screen PHQ  2/9 Scores 02/03/2021 11/27/2020 09/22/2020 04/13/2020 02/10/2020 01/17/2020 09/18/2019  PHQ - 2 Score 0 0 0 0 0 0 0    Fall Risk Fall Risk  02/03/2021 09/22/2020 04/13/2020 02/10/2020 01/17/2020  Falls in the past year? 0 0 0 0 0  Number falls in past yr: 0 - - - -  Injury with Fall? 0 - - - -  Risk for fall due to : No Fall Risks - - - -  Follow up Falls prevention discussed - - - -    FALL [place]:  Any stairs in or around the home? Yes  If so, are there any without handrails? No  Home free of loose throw rugs in walkways, pet beds, electrical cords, etc? Yes  Adequate lighting in your home to reduce risk of falls? Yes   ASSISTIVE DEVICES UTILIZED TO PREVENT FALLS:  Life alert? No  Use of a cane, walker or w/c? No  Grab bars in the bathroom? No  Shower chair or bench in shower? No  Elevated toilet seat or a handicapped toilet? No   TIMED UP AND GO:  Was the test performed? No . Phone visit.   Cognitive Function:     6CIT Screen 02/03/2021  What Year? 0 points  What month? 0 points  What time? 0 points  Count back from 20 0 points  Months in reverse 0 points  Repeat phrase 0 points  Total Score 0    Immunizations Immunization History  Administered Date(s) Administered   Fluad Quad(high Dose 65+) 12/17/2018, 12/23/2019   Influenza, High Dose Seasonal PF 01/05/2016, 01/15/2018   Influenza,inj,Quad PF,6+ Mos 01/09/2015, 12/26/2016   Moderna SARS-COV2 Booster Vaccination 11/17/2020   Moderna Sars-Covid-2 Vaccination 04/02/2019, 05/03/2019, 01/25/2020   Pneumococcal Conjugate-13 07/18/2016   Pneumococcal Polysaccharide-23 01/09/2015   Td 06/20/1998   Tdap 05/26/2016   Zoster Recombinat (Shingrix) 07/17/2020, 09/22/2020   Zoster, Live 10/24/2005    TDAP status: Up to date  Flu Vaccine status: Up to date  Pneumococcal vaccine status: Up to date  Covid-19 vaccine status: Information provided on how to obtain vaccines.   Qualifies for  Shingles Vaccine? Yes  Zostavax completed Yes   Shingrix Completed?: Yes  Screening Tests Health Maintenance  Topic Date Due   COVID-19 Vaccine (4 - Booster for Moderna series) 01/12/2021   INFLUENZA VACCINE  06/18/2021 (Originally 10/19/2020)   TETANUS/TDAP  05/27/2026   Pneumonia Vaccine 34+ Years old  Completed   DEXA SCAN  Completed   Hepatitis C Screening  Completed   Zoster Vaccines- Shingrix  Completed   HPV VACCINES  Aged Out    Health Maintenance  Health Maintenance Due  Topic Date Due   COVID-19 Vaccine (4 - Booster for Moderna series) 01/12/2021    Colorectal cancer screening: No longer required.   Mammogram status: Completed 11/30/2020. Repeat every year  Bone Density status: Completed 05/19/2005. Results reflect: Bone density results: OSTEOPOROSIS. Repeat every 2 years.  Lung Cancer Screening: (Low Dose CT Chest recommended if Age 75-80 years, 30 pack-year currently smoking OR have quit w/in 15years.) does not qualify.  Non-Smoker.  Additional Screening:  Hepatitis C Screening: does qualify; Completed 09/18/2019  Vision Screening: Recommended annual ophthalmology exams for early detection of glaucoma and other disorders of the eye. Is the patient up to date with their annual eye exam?  Yes  Who is the provider or what is the name of the office in which the patient attends annual eye exams? Dr. Satira Sark If pt is not established with a provider, would they like to be referred to a provider to establish care? No .   Dental Screening: Recommended annual dental exams for proper oral hygiene  Community Resource Referral / Chronic Care Management: CRR required this visit?  No   CCM required this visit?  No      Plan:     I have personally reviewed and noted the following in the patient's chart:   Medical and social history Use of alcohol, tobacco or illicit drugs  Current medications and supplements including opioid prescriptions. Patient is not currently  taking opioid prescriptions. Functional ability and status Nutritional status Physical activity Advanced directives List of other physicians Hospitalizations, surgeries, and ER visits in previous 12 months Vitals Screenings to include cognitive, depression, and falls Referrals and appointments  In addition, I have reviewed and discussed with patient certain preventive protocols, quality metrics, and best practice recommendations. A written personalized care plan for preventive services as well as general preventive health recommendations were provided to patient.     Chriss Driver, LPN   [id number]/[id number]   Nurse Notes: Up to date on health maintenance and vaccines. Pt declines repeat Dexa. Colonoscopy done 06/05/2008 and pt would not like to repeat due to age.

## 2021-02-03 NOTE — Patient Instructions (Signed)
Autumn Moyer , Thank you for taking time to come for your Medicare Wellness Visit. I appreciate your ongoing commitment to your health goals. Please review the following plan we discussed and let me know if I can assist you in the future.   Screening recommendations/referrals: Colonoscopy: No longer required due to age. Done 06/05/2008. Mammogram: Done 11/30/2020 Repeat annually  Bone Density: Done 05/19/2005.   Recommended yearly ophthalmology/optometry visit for glaucoma screening and checkup Recommended yearly dental visit for hygiene and checkup  Vaccinations: Influenza vaccine: Done 12/14/2020. Repeat annually  Pneumococcal vaccine: Done 01/09/2015 and 07/18/2016 Tdap vaccine: Done 05/26/2016 Repeat in 10 years  Shingles vaccine: Done 07/17/20 and 09/22/2020   Covid-19:Done 04/02/2019, 04/23/2019 and 01/25/2020.  Advanced directives: Please bring a copy of your health care power of attorney and living will to the office to be added to your chart at your convenience.   Conditions/risks identified: Aim for 30 minutes of exercise or walking each day, drink 6-8 glasses of water and eat lots of fruits and vegetables.   Next appointment: Follow up in one year for your annual wellness visit 2023.   Preventive Care 73 Years and Older, Female Preventive care refers to lifestyle choices and visits with your health care provider that can promote health and wellness. What does preventive care include? A yearly physical exam. This is also called an annual well check. Dental exams once or twice a year. Routine eye exams. Ask your health care provider how often you should have your eyes checked. Personal lifestyle choices, including: Daily care of your teeth and gums. Regular physical activity. Eating a healthy diet. Avoiding tobacco and drug use. Limiting alcohol use. Practicing safe sex. Taking low-dose aspirin every day. Taking vitamin and mineral supplements as recommended by your health care  provider. What happens during an annual well check? The services and screenings done by your health care provider during your annual well check will depend on your age, overall health, lifestyle risk factors, and family history of disease. Counseling  Your health care provider may ask you questions about your: Alcohol use. Tobacco use. Drug use. Emotional well-being. Home and relationship well-being. Sexual activity. Eating habits. History of falls. Memory and ability to understand (cognition). Work and work Statistician. Reproductive health. Screening  You may have the following tests or measurements: Height, weight, and BMI. Blood pressure. Lipid and cholesterol levels. These may be checked every 5 years, or more frequently if you are over 50 years old. Skin check. Lung cancer screening. You may have this screening every year starting at age 75 if you have a 30-pack-year history of smoking and currently smoke or have quit within the past 15 years. Fecal occult blood test (FOBT) of the stool. You may have this test every year starting at age 76. Flexible sigmoidoscopy or colonoscopy. You may have a sigmoidoscopy every 5 years or a colonoscopy every 10 years starting at age 6. Hepatitis C blood test. Hepatitis B blood test. Sexually transmitted disease (STD) testing. Diabetes screening. This is done by checking your blood sugar (glucose) after you have not eaten for a while (fasting). You may have this done every 1-3 years. Bone density scan. This is done to screen for osteoporosis. You may have this done starting at age 68. Mammogram. This may be done every 1-2 years. Talk to your health care provider about how often you should have regular mammograms. Talk with your health care provider about your test results, treatment options, and if necessary, the need for  more tests. Vaccines  Your health care provider may recommend certain vaccines, such as: Influenza vaccine. This is  recommended every year. Tetanus, diphtheria, and acellular pertussis (Tdap, Td) vaccine. You may need a Td booster every 10 years. Zoster vaccine. You may need this after age 82. Pneumococcal 13-valent conjugate (PCV13) vaccine. One dose is recommended after age 71. Pneumococcal polysaccharide (PPSV23) vaccine. One dose is recommended after age 54. Talk to your health care provider about which screenings and vaccines you need and how often you need them. This information is not intended to replace advice given to you by your health care provider. Make sure you discuss any questions you have with your health care provider. Document Released: 04/03/2015 Document Revised: 11/25/2015 Document Reviewed: 01/06/2015 Elsevier Interactive Patient Education  2017 [place] Prevention in the Home Falls can cause injuries. They can happen to people of all ages. There are many things you can do to make your home safe and to help prevent falls. What can I do on the outside of my home? Regularly fix the edges of walkways and driveways and fix any cracks. Remove anything that might make you trip as you walk through a door, such as a raised step or threshold. Trim any bushes or trees on the path to your home. Use bright outdoor lighting. Clear any walking paths of anything that might make someone trip, such as rocks or tools. Regularly check to see if handrails are loose or broken. Make sure that both sides of any steps have handrails. Any raised decks and porches should have guardrails on the edges. Have any leaves, snow, or ice cleared regularly. Use sand or salt on walking paths during winter. Clean up any spills in your garage right away. This includes oil or grease spills. What can I do in the bathroom? Use night lights. Install grab bars by the toilet and in the tub and shower. Do not use towel bars as grab bars. Use non-skid mats or decals in the tub or shower. If you need to sit down in  the shower, use a plastic, non-slip stool. Keep the floor dry. Clean up any water that spills on the floor as soon as it happens. Remove soap buildup in the tub or shower regularly. Attach bath mats securely with double-sided non-slip rug tape. Do not have throw rugs and other things on the floor that can make you trip. What can I do in the bedroom? Use night lights. Make sure that you have a light by your bed that is easy to reach. Do not use any sheets or blankets that are too big for your bed. They should not hang down onto the floor. Have a firm chair that has side arms. You can use this for support while you get dressed. Do not have throw rugs and other things on the floor that can make you trip. What can I do in the kitchen? Clean up any spills right away. Avoid walking on wet floors. Keep items that you use a lot in easy-to-reach places. If you need to reach something above you, use a strong step stool that has a grab bar. Keep electrical cords out of the way. Do not use floor polish or wax that makes floors slippery. If you must use wax, use non-skid floor wax. Do not have throw rugs and other things on the floor that can make you trip. What can I do with my stairs? Do not leave any items on the stairs. Make  sure that there are handrails on both sides of the stairs and use them. Fix handrails that are broken or loose. Make sure that handrails are as long as the stairways. Check any carpeting to make sure that it is firmly attached to the stairs. Fix any carpet that is loose or worn. Avoid having throw rugs at the top or bottom of the stairs. If you do have throw rugs, attach them to the floor with carpet tape. Make sure that you have a light switch at the top of the stairs and the bottom of the stairs. If you do not have them, ask someone to add them for you. What else can I do to help prevent falls? Wear shoes that: Do not have high heels. Have rubber bottoms. Are comfortable  and fit you well. Are closed at the toe. Do not wear sandals. If you use a stepladder: Make sure that it is fully opened. Do not climb a closed stepladder. Make sure that both sides of the stepladder are locked into place. Ask someone to hold it for you, if possible. Clearly mark and make sure that you can see: Any grab bars or handrails. First and last steps. Where the edge of each step is. Use tools that help you move around (mobility aids) if they are needed. These include: Canes. Walkers. Scooters. Crutches. Turn on the lights when you go into a dark area. Replace any light bulbs as soon as they burn out. Set up your furniture so you have a clear path. Avoid moving your furniture around. If any of your floors are uneven, fix them. If there are any pets around you, be aware of where they are. Review your medicines with your doctor. Some medicines can make you feel dizzy. This can increase your chance of falling. Ask your doctor what other things that you can do to help prevent falls. This information is not intended to replace advice given to you by your health care provider. Make sure you discuss any questions you have with your health care provider. Document Released: 01/01/2009 Document Revised: 08/13/2015 Document Reviewed: 04/11/2014 Elsevier Interactive Patient Education  2017 [employer].

## 2021-02-22 ENCOUNTER — Ambulatory Visit: Payer: PPO | Admitting: Family

## 2021-03-03 ENCOUNTER — Other Ambulatory Visit: Payer: Self-pay | Admitting: Family Medicine

## 2021-03-03 ENCOUNTER — Telehealth: Payer: Self-pay | Admitting: Family

## 2021-03-03 DIAGNOSIS — K219 Gastro-esophageal reflux disease without esophagitis: Secondary | ICD-10-CM

## 2021-03-03 DIAGNOSIS — I10 Essential (primary) hypertension: Secondary | ICD-10-CM

## 2021-03-03 MED ORDER — PANTOPRAZOLE SODIUM 40 MG PO TBEC: 40.0000 mg | DELAYED_RELEASE_TABLET | Freq: Every day | ORAL | 1 refills | Status: DC

## 2021-03-03 MED ORDER — BENAZEPRIL-HYDROCHLOROTHIAZIDE 20-25 MG PO TABS: 1.0000 | ORAL_TABLET | Freq: Every day | ORAL | 1 refills | Status: DC

## 2021-03-03 NOTE — Telephone Encounter (Signed)
Called and spoke with patient and she was requesting refills. Refills sent to patient pharmacy. She is aware.

## 2021-03-03 NOTE — Telephone Encounter (Signed)
Pt requesting to speak to [employer]. Refused to give any information.

## 2021-03-22 DIAGNOSIS — R0602 Shortness of breath: Secondary | ICD-10-CM | POA: Insufficient documentation

## 2021-03-22 NOTE — Progress Notes (Deleted)
Cardiology Office Note   Date:  03/22/2021   ID:  Birdie, Fetty 03-24-1940, MRN [id number]  PCP:  Sharion Balloon, FNP  Cardiologist:   Minus Breeding, MD   No chief complaint on file.     History of Present Illness: Autumn Moyer is a 81 y.o. female who presents for evaluation of aortic stenosis and new onset atrial fibrillation. Her AS was moderate in August in 2020.  Atrial fib was new prior to the last visit and confirmed on a follow up monitor.  Her AS was found to be moderate on echo.   She had atrial fib.   I did review a monitor demonstrating that she spends about 40% of her time in fibrillation with the longest episode lasting about 19 hours.  Rate was controlled.   ***   ***   I cannot correlate her fibrillation with any symptoms.  She says when she checks her heart rate on her oximeter it is in the 60s or 70s.  She does not find it to be elevated.  She does get dyspneic walking a slight distance on level ground.  She is not describing resting shortness of breath, PND or orthopnea.  She is not having any presyncope or syncope.  She has never correlated her shortness of breath with her oxygen saturation for her heart rate.    Past Medical History:  Diagnosis Date   Atrial fibrillation ([place])    Hypertension    Squamous cell carcinoma of skin 10/23/2013   in situ- left forearm (CX35FU)   Squamous cell carcinoma of skin 01/16/2018   in situ-right knee post (txpbx)   Thyroid disease     Past Surgical History:  Procedure Laterality Date   EYE SURGERY     TOE SURGERY     TUBAL LIGATION       Current Outpatient Medications  Medication Sig Dispense Refill   acetaminophen (TYLENOL) 325 MG tablet Take 650 mg by mouth every 6 (six) hours as needed.     apixaban (ELIQUIS) 5 MG TABS tablet Take 1 tablet (5 mg total) by mouth 2 (two) times daily. 60 tablet 0   benazepril-hydrochlorthiazide (LOTENSIN HCT) 20-25 MG tablet Take 1 tablet by mouth daily. 90 tablet 1    doxycycline (VIBRA-TABS) 100 MG tablet Take 1 tablet (100 mg total) by mouth 2 (two) times daily. 20 tablet 0   fluticasone (FLONASE) 50 MCG/ACT nasal spray Place 2 sprays into both nostrils daily. 16 g 6   FLUZONE HIGH-DOSE QUADRIVALENT 0.7 ML SUSY      furosemide (LASIX) 20 MG tablet Take 1 tablet (20 mg total) by mouth daily. (Patient not taking: Reported on 07/17/2020) 90 tablet 3   levocetirizine (XYZAL) 5 MG tablet Take 1 tablet (5 mg total) by mouth every evening. 90 tablet 1   levothyroxine (SYNTHROID) 88 MCG tablet Take 1 tablet (88 mcg total) by mouth daily. 90 tablet 3   Lysine 500 MG TABS Take by mouth 2 (two) times daily.      metoprolol tartrate (LOPRESSOR) 25 MG tablet Take 1 tablet by mouth twice daily 180 tablet 1   mometasone (ELOCON) 0.1 % cream Apply 1 application topically daily. 45 g 2   Multiple Vitamin (MULTIVITAMIN) capsule Take 1 capsule by mouth daily.     Omega-3 Fatty Acids (FISH OIL) 500 MG CAPS Take 1,000 mg by mouth daily.     pantoprazole (PROTONIX) 40 MG tablet Take 1 tablet (40 mg total) by  mouth daily. 90 tablet 1   SHINGRIX injection      sodium chloride (OCEAN) 0.65 % SOLN nasal spray Place 1 spray into both nostrils as needed for congestion. 60 mL 0   tacrolimus (PROTOPIC) 0.1 % ointment Apply topically in the morning and at bedtime. 30 g 4   No current facility-administered medications for this visit.    Allergies:   Noroxin [norfloxacin] and Augmentin [amoxicillin-pot clavulanate]    ROS:  Please see the history of present illness.   Otherwise, review of systems are positive for ***.   All other systems are reviewed and negative.    PHYSICAL EXAM: VS:  There were no vitals taken for this visit. , BMI There is no height or weight on file to calculate BMI. GENERAL:  Well appearing NECK:  No jugular venous distention, waveform within normal limits, carotid upstroke brisk and symmetric, no bruits, no thyromegaly LUNGS:  Clear to auscultation  bilaterally CHEST:  Unremarkable HEART:  PMI not displaced or sustained,S1 and S2 within normal limits, no S3, no S4, no clicks, no rubs, *** murmurs ABD:  Flat, positive bowel sounds normal in frequency in pitch, no bruits, no rebound, no guarding, no midline pulsatile mass, no hepatomegaly, no splenomegaly EXT:  2 plus pulses throughout, no edema, no cyanosis no clubbing     ***GENERAL:  Well appearing NECK:  No jugular venous distention, waveform within normal limits, carotid upstroke brisk and symmetric, no bruits, no thyromegaly LUNGS:  Clear to auscultation bilaterally CHEST:  Unremarkable HEART:  PMI not displaced or sustained,S1 and S2 within normal limits, no S3, no S4, no clicks, no rubs, slight apical systolic murmur radiating out the aortic outflow tract murmurs ABD:  Flat, positive bowel sounds normal in frequency in pitch, no bruits, no rebound, no guarding, no midline pulsatile mass, no hepatomegaly, no splenomegaly EXT:  2 plus pulses throughout, no edema, no cyanosis no clubbing   EKG:  EKG is *** ordered today. ***  Recent Labs: 04/24/2020: NT-Pro BNP 479 09/22/2020: Hemoglobin 13.4; Platelets 243 11/27/2020: ALT 15; BUN 17; Creatinine, Ser 0.95; Potassium 4.1; Sodium 139; TSH 0.837    Lipid Panel No results found for: CHOL, TRIG, HDL, CHOLHDL, VLDL, LDLCALC, LDLDIRECT    Wt Readings from Last 3 Encounters:  02/03/21 168 lb (76.2 kg)  11/27/20 168 lb 9.6 oz (76.5 kg)  09/22/20 169 lb 9.6 oz (76.9 kg)      Other studies Reviewed: Additional studies/ records that were reviewed today include: *** Review of the above records demonstrates:    ***   ASSESSMENT AND PLAN:  AS:   This was moderate on Jan.  ***  I do not think this is contributing to her symptoms I will follow this clinically.   HTN: Her blood pressure is *** at target.  No change in therapy.   SOB:   ***    I do not see a clear cardiac etiology but I am going to exclude obstructive coronary disease  with a Lexiscan Myoview and also check a BNP level.   ATRIAL FIB: She has new onset paroxysmal atrial fibrillation.   Autumn Moyer has a CHA2DS2 - VASc score of 4.  *** She tolerates anticoagulation and seems to have good rate control.  I am going to try to have her correlate any shortness of breath with her heart rate.  At present it does not seem that there is a correlation with the rhythm.    Current medicines are reviewed  at length with the patient today.  The patient does not have concerns regarding medicines.  The following changes have been made:  None  Disposition:   Follow up with after the [place].  Signed, Minus Breeding, MD  03/22/2021 9:47 PM    [facility]

## 2021-03-24 ENCOUNTER — Ambulatory Visit: Payer: PPO | Admitting: Cardiology

## 2021-03-24 DIAGNOSIS — I480 Paroxysmal atrial fibrillation: Secondary | ICD-10-CM

## 2021-03-24 DIAGNOSIS — I350 Nonrheumatic aortic (valve) stenosis: Secondary | ICD-10-CM

## 2021-03-24 DIAGNOSIS — R0602 Shortness of breath: Secondary | ICD-10-CM

## 2021-03-24 DIAGNOSIS — I10 Essential (primary) hypertension: Secondary | ICD-10-CM

## 2021-05-10 ENCOUNTER — Other Ambulatory Visit: Payer: Self-pay | Admitting: Family

## 2021-05-10 DIAGNOSIS — I4891 Unspecified atrial fibrillation: Secondary | ICD-10-CM

## 2021-05-16 NOTE — Progress Notes (Signed)
?  ?Cardiology Office Note ? ? ?Date:  05/19/2021  ? ?ID:  Autumn Moyer, DOB 06-Jul-1940, MRN [id number] ? ?PCP:  Sharion Balloon, FNP  ?Cardiologist:   Minus Breeding, MD  ? ?Chief Complaint  ?Patient presents with  ? Atrial Fibrillation  ? ? ?  ?History of Present Illness: ?Autumn Moyer is a 81 y.o. female who presents for evaluation of aortic stenosis and new onset atrial fibrillation. Her AS was moderate in August in 2020.  Atrial fib was new prior to the last visit and confirmed on a follow up monitor.  Her AS was found to be moderate on echo.    At the last visit she had SOB.  Lexiscan Myoview was negative for ischemia.   ? ?Since I last saw her she continues to have some dyspnea with exertion which she thinks is chronic.  We have never been able to correlate this with her fibrillation.  She has previously been documented to spend about 40% of the time in fibrillation but with rate control.  She tells me that her heart rate is typically in the 50s to 60s.  Today it is elevated and she says this is unusual.  She thought she might be in fibrillation and she is but she does not feel particularly short of breath or tired.  She does not feel her heart racing.  She had no chest pressure, neck or arm discomfort.  She had no weight gain or edema.  Her husband was very sick and critically ill in the hospital for about 30 days and she spent a lot of time with him. ? ? ?Past Medical History:  ?Diagnosis Date  ? Atrial fibrillation ([place])   ? Hypertension   ? Squamous cell carcinoma of skin 10/23/2013  ? in situ- left forearm (CX35FU)  ? Squamous cell carcinoma of skin 01/16/2018  ? in situ-right knee post (txpbx)  ? Thyroid disease   ? ? ?Past Surgical History:  ?Procedure Laterality Date  ? EYE SURGERY    ? TOE SURGERY    ? TUBAL LIGATION    ? ? ? ?Current Outpatient Medications  ?Medication Sig Dispense Refill  ? acetaminophen (TYLENOL) 325 MG tablet Take 650 mg by mouth every 6 (six) hours as needed.    ?  benazepril-hydrochlorthiazide (LOTENSIN HCT) 20-25 MG tablet Take 1 tablet by mouth daily. 90 tablet 1  ? ELIQUIS 5 MG TABS tablet Take 1 tablet by mouth twice daily 60 tablet 0  ? fluticasone (FLONASE) 50 MCG/ACT nasal spray Place 2 sprays into both nostrils daily. 16 g 6  ? furosemide (LASIX) 20 MG tablet Take 1 tablet (20 mg total) by mouth daily. (Patient taking differently: Take 20 mg by mouth daily as needed.) 90 tablet 3  ? levocetirizine (XYZAL) 5 MG tablet Take 1 tablet (5 mg total) by mouth every evening. 90 tablet 1  ? levothyroxine (SYNTHROID) 88 MCG tablet Take 1 tablet (88 mcg total) by mouth daily. 90 tablet 3  ? metoprolol tartrate (LOPRESSOR) 25 MG tablet Take 1 tablet by mouth twice daily 180 tablet 1  ? mometasone (ELOCON) 0.1 % cream Apply 1 application topically daily. 45 g 2  ? Multiple Vitamin (MULTIVITAMIN) capsule Take 1 capsule by mouth daily.    ? pantoprazole (PROTONIX) 40 MG tablet Take 1 tablet (40 mg total) by mouth daily. 90 tablet 1  ? tacrolimus (PROTOPIC) 0.1 % ointment Apply topically in the morning and at bedtime. 30 g 4  ?  FLUZONE HIGH-DOSE QUADRIVALENT 0.7 ML SUSY     ? Lysine 500 MG TABS Take by mouth 2 (two) times daily.  (Patient not taking: Reported on 05/19/2021)    ? Omega-3 Fatty Acids (FISH OIL) 500 MG CAPS Take 1,000 mg by mouth daily. (Patient not taking: Reported on 05/19/2021)    ? SHINGRIX injection     ? sodium chloride (OCEAN) 0.65 % SOLN nasal spray Place 1 spray into both nostrils as needed for congestion. (Patient not taking: Reported on 05/19/2021) 60 mL 0  ? ?No current facility-administered medications for this visit.  ? ? ?Allergies:   Noroxin [norfloxacin] and Augmentin [amoxicillin-pot clavulanate]  ? ? ?ROS:  Please see the history of present illness.   Otherwise, review of systems are positive for none.   All other systems are reviewed and negative.  ? ? ?PHYSICAL EXAM: ?VS:  BP 118/80   Pulse (!) 118   Ht 5\' 4"  (1.626 m)   Wt 166 lb (75.3 kg)   BMI  28.49 kg/m?  , BMI Body mass index is 28.49 kg/m?. ?GENERAL:  Well appearing ?NECK:  No jugular venous distention, waveform within normal limits, carotid upstroke brisk and symmetric, no bruits, no thyromegaly ?LUNGS:  Clear to auscultation bilaterally ?CHEST:  Unremarkable ?HEART:  PMI not displaced or sustained,S1 and S2 within normal limits, no S3, no clicks, no rubs, 2 out of 6 brief apical systolic murmur radiating slightly at the aortic outflow tract, no diastolic murmurs, irregular  ?ABD:  Flat, positive bowel sounds normal in frequency in pitch, no bruits, no rebound, no guarding, no midline pulsatile mass, no hepatomegaly, no splenomegaly ?EXT:  2 plus pulses throughout, no edema, no cyanosis no clubbing ? ? ?EKG:  EKG is  ordered today. ?Atrial fibrillation, rate 118, axis within normal limits, intervals within normal limits, no acute ST-T wave changes. ? ?Recent Labs: ?09/22/2020: Hemoglobin 13.4; Platelets 243 ?11/27/2020: ALT 15; BUN 17; Creatinine, Ser 0.95; Potassium 4.1; Sodium 139; TSH 0.837  ? ? ?Lipid Panel ?No results found for: CHOL, TRIG, HDL, CHOLHDL, VLDL, LDLCALC, LDLDIRECT ?  ? ?Wt Readings from Last 3 Encounters:  ?05/19/21 166 lb (75.3 kg)  ?02/03/21 168 lb (76.2 kg)  ?11/27/20 168 lb 9.6 oz (76.5 kg)  ?  ? ? ?Other studies Reviewed: ?Additional studies/ records that were reviewed today include: Echocardiogram and labs ?Review of the above records demonstrates:    See elsewhere ? ? ?ASSESSMENT AND PLAN: ? ?AS:   This was moderate.  I will repeat an echocardiogram. ? ?HTN: Her blood pressure is at target.  No change in therapy.   ? ?SOB:   This seems to be baseline has not correlated with her fibrillation.  No change in therapy. ? ?ATRIAL FIB: She has new onset paroxysmal atrial fibrillation.   [name] has a CHA2DS2 - VASc score of 4.  She tolerates anticoagulation.  Even though the rate is elevated.  This is unusual for her.  She records it on a pulse ox routinely and says that she  does not stay above 100.  She is not really feeling the fibrillation.  Therefore, for now we will do rate control and anticoagulation for her paroxysmal atrial fibrillation.  ? ? ? ?Current medicines are reviewed at length with the patient today.  The patient does not have concerns regarding medicines. ? ?The following changes have been made: None ? ?Disposition:   Follow up me in one year.  ? ?Signed, ?Minus Breeding, MD  ?  05/19/2021 2:18 PM    ?[place] ?

## 2021-05-19 ENCOUNTER — Encounter: Payer: Self-pay | Admitting: Cardiology

## 2021-05-19 ENCOUNTER — Ambulatory Visit (INDEPENDENT_AMBULATORY_CARE_PROVIDER_SITE_OTHER): Payer: PPO | Admitting: Cardiology

## 2021-05-19 ENCOUNTER — Other Ambulatory Visit: Payer: Self-pay

## 2021-05-19 VITALS — BP 118/80 | HR 118 | Ht 64.0 in | Wt 166.0 lb

## 2021-05-19 DIAGNOSIS — R0602 Shortness of breath: Secondary | ICD-10-CM

## 2021-05-19 DIAGNOSIS — I480 Paroxysmal atrial fibrillation: Secondary | ICD-10-CM | POA: Diagnosis not present

## 2021-05-19 DIAGNOSIS — I10 Essential (primary) hypertension: Secondary | ICD-10-CM

## 2021-05-19 DIAGNOSIS — I350 Nonrheumatic aortic (valve) stenosis: Secondary | ICD-10-CM

## 2021-05-19 NOTE — Patient Instructions (Signed)
Medication Instructions:  ?The current medical regimen is effective;  continue present plan and medications. ? ?*If you need a refill on your cardiac medications before your next appointment, please call your pharmacy* ? ?Testing/Procedures: ?Your physician has requested that you have an echocardiogram. Echocardiography is a painless test that uses sound waves to create images of your heart. It provides your doctor with information about the size and shape of your heart and how well your heart?s chambers and valves are working. This procedure takes approximately one hour. There are no restrictions for this procedure. ?You will be contacted to be scheduled for this testing. ? ?Follow-Up: ?At [facility], you and your health needs are our priority.  As part of our continuing mission to provide you with exceptional heart care, we have created designated Provider Care Teams.  These Care Teams include your primary Cardiologist (physician) and Advanced Practice Providers (APPs -  Physician Assistants and Nurse Practitioners) who all work together to provide you with the care you need, when you need it. ? ?We recommend signing up for the patient portal called "MyChart".  Sign up information is provided on this After Visit Summary.  MyChart is used to connect with patients for Virtual Visits (Telemedicine).  Patients are able to view lab/test results, encounter notes, upcoming appointments, etc.  Non-urgent messages can be sent to your provider as well.   ?To learn more about what you can do with MyChart, go to [email or web address].   ? ?Your next appointment:   ?1 year(s) ? ?The format for your next appointment:   ?In Person ? ?Provider:   ?Minus Breeding, MD  ? ?Thank you for choosing [facility]!! ? ? ? ?

## 2021-05-25 ENCOUNTER — Ambulatory Visit (HOSPITAL_BASED_OUTPATIENT_CLINIC_OR_DEPARTMENT_OTHER): Payer: PPO

## 2021-05-25 ENCOUNTER — Other Ambulatory Visit: Payer: Self-pay

## 2021-05-25 DIAGNOSIS — I350 Nonrheumatic aortic (valve) stenosis: Secondary | ICD-10-CM

## 2021-05-25 LAB — ECHOCARDIOGRAM COMPLETE
AR max vel: 0.49 cm2
AV Area VTI: 0.5 cm2
AV Area mean vel: 0.46 cm2
AV Mean grad: 30.3 mmHg
AV Peak grad: 49.2 mmHg
AV Vena cont: 0.31 cm
Ao pk vel: 3.51 m/s
Area-P 1/2: 5.54 cm2
P 1/2 time: 522 msec

## 2021-05-27 ENCOUNTER — Encounter: Payer: Self-pay | Admitting: Family

## 2021-05-27 ENCOUNTER — Ambulatory Visit (INDEPENDENT_AMBULATORY_CARE_PROVIDER_SITE_OTHER): Payer: PPO | Admitting: Family

## 2021-05-27 VITALS — BP 116/67 | HR 95 | Temp 97.6°F | Ht 64.0 in | Wt 165.4 lb

## 2021-05-27 DIAGNOSIS — I10 Essential (primary) hypertension: Secondary | ICD-10-CM | POA: Diagnosis not present

## 2021-05-27 DIAGNOSIS — E669 Obesity, unspecified: Secondary | ICD-10-CM

## 2021-05-27 DIAGNOSIS — K219 Gastro-esophageal reflux disease without esophagitis: Secondary | ICD-10-CM

## 2021-05-27 DIAGNOSIS — I4891 Unspecified atrial fibrillation: Secondary | ICD-10-CM

## 2021-05-27 DIAGNOSIS — E039 Hypothyroidism, unspecified: Secondary | ICD-10-CM

## 2021-05-27 DIAGNOSIS — J309 Allergic rhinitis, unspecified: Secondary | ICD-10-CM | POA: Diagnosis not present

## 2021-05-27 DIAGNOSIS — I480 Paroxysmal atrial fibrillation: Secondary | ICD-10-CM | POA: Diagnosis not present

## 2021-05-27 DIAGNOSIS — I350 Nonrheumatic aortic (valve) stenosis: Secondary | ICD-10-CM

## 2021-05-27 DIAGNOSIS — H6123 Impacted cerumen, bilateral: Secondary | ICD-10-CM | POA: Diagnosis not present

## 2021-05-27 DIAGNOSIS — D509 Iron deficiency anemia, unspecified: Secondary | ICD-10-CM | POA: Diagnosis not present

## 2021-05-27 MED ORDER — METOPROLOL TARTRATE 25 MG PO TABS: 25.0000 mg | ORAL_TABLET | Freq: Two times a day (BID) | ORAL | 1 refills | Status: DC

## 2021-05-27 MED ORDER — FUROSEMIDE 20 MG PO TABS: 20.0000 mg | ORAL_TABLET | Freq: Every day | ORAL | 1 refills | Status: DC | PRN

## 2021-05-27 MED ORDER — LEVOCETIRIZINE DIHYDROCHLORIDE 5 MG PO TABS: 5.0000 mg | ORAL_TABLET | Freq: Every evening | ORAL | 1 refills | Status: DC

## 2021-05-27 MED ORDER — APIXABAN 5 MG PO TABS: 5.0000 mg | ORAL_TABLET | Freq: Two times a day (BID) | ORAL | 1 refills | Status: DC

## 2021-05-27 MED ORDER — LEVOTHYROXINE SODIUM 88 MCG PO TABS: 88.0000 ug | ORAL_TABLET | Freq: Every day | ORAL | 3 refills | Status: DC

## 2021-05-27 MED ORDER — BENAZEPRIL-HYDROCHLOROTHIAZIDE 20-25 MG PO TABS: 1.0000 | ORAL_TABLET | Freq: Every day | ORAL | 1 refills | Status: DC

## 2021-05-27 MED ORDER — PANTOPRAZOLE SODIUM 40 MG PO TBEC: 40.0000 mg | DELAYED_RELEASE_TABLET | Freq: Every day | ORAL | 1 refills | Status: DC

## 2021-05-27 NOTE — Progress Notes (Signed)
? ?Subjective:  ? ? Patient ID: Autumn Moyer, female    DOB: 1941/02/03, 81 y.o.   MRN: [id number] ? ?Chief Complaint  ?Patient presents with  ? Medical Management of Chronic Issues  ? ?Pt presents to the offic today for chronic follow up. She is followed by Cardiologists  for A Fib, Aortic stenosis and gets an Echo each year. She is currently on Eliquis 5 mg BID.  ?Hypertension ?This is a chronic problem. The current episode started more than 1 year ago. The problem has been resolved since onset. The problem is controlled. Associated symptoms include malaise/fatigue. Pertinent negatives include no peripheral edema or shortness of breath. Risk factors for coronary artery disease include dyslipidemia and sedentary lifestyle. The current treatment provides moderate improvement. There is no history of kidney disease or heart failure. Identifiable causes of hypertension include a thyroid problem.  ?Gastroesophageal Reflux ?She complains of belching and heartburn. This is a chronic problem. The current episode started more than 1 year ago. The problem occurs occasionally. Associated symptoms include fatigue. She has tried a PPI for the symptoms. The treatment provided moderate relief.  ?Thyroid Problem ?Presents for follow-up visit. Symptoms include anxiety and fatigue. Patient reports no nail problem. The symptoms have been stable. There is no history of heart failure.  ?Anemia ?Presents for follow-up visit. Symptoms include malaise/fatigue. There is no history of heart failure.  ? ? ? ?Review of Systems  ?Constitutional:  Positive for fatigue and malaise/fatigue.  ?Respiratory:  Negative for shortness of breath.   ?Gastrointestinal:  Positive for heartburn.  ?Psychiatric/Behavioral:  The patient is nervous/anxious.   ?All other systems reviewed and are negative. ? ?   ?Objective:  ? Physical Exam ?Vitals reviewed.  ?Constitutional:   ?   General: She is not in acute distress. ?   Appearance: She is well-developed. She  is obese.  ?HENT:  ?   Head: Normocephalic and atraumatic.  ?   Right Ear: There is impacted cerumen.  ?   Left Ear: There is impacted cerumen.  ?Eyes:  ?   Pupils: Pupils are equal, round, and reactive to light.  ?Neck:  ?   Thyroid: No thyromegaly.  ?Cardiovascular:  ?   Rate and Rhythm: Normal rate and regular rhythm.  ?   Heart sounds: Murmur heard.  ?Pulmonary:  ?   Effort: Pulmonary effort is normal. No respiratory distress.  ?   Breath sounds: Normal breath sounds. No wheezing.  ?Abdominal:  ?   General: Bowel sounds are normal. There is no distension.  ?   Palpations: Abdomen is soft.  ?   Tenderness: There is no abdominal tenderness.  ?Musculoskeletal:     ?   General: No tenderness. Normal range of motion.  ?   Cervical back: Normal range of motion and neck supple.  ?Skin: ?   General: Skin is warm and dry.  ?Neurological:  ?   Mental Status: She is alert and oriented to person, place, and time.  ?   Cranial Nerves: No cranial nerve deficit.  ?   Deep Tendon Reflexes: Reflexes are normal and symmetric.  ?Psychiatric:     ?   Behavior: Behavior normal.     ?   Thought Content: Thought content normal.     ?   Judgment: Judgment normal.  ? ?Bilateral cerumen impaction, washed with warm water and peroxide. TM WNL.  ? ?BP 116/67   Pulse 95   Temp 97.6 ?F (36.4 ?C) (Temporal)   Ht  5' 4"  (1.626 m)   Wt 165 lb 6.4 oz (75 kg)   BMI 28.39 kg/m?  ? ?   ?Assessment & Plan:  ?Autumn Moyer comes in today with chief complaint of Medical Management of Chronic Issues ? ? ?Diagnosis and orders addressed: ? ?1. Allergic rhinitis, unspecified seasonality, unspecified trigger ?- levocetirizine (XYZAL) 5 MG tablet; Take 1 tablet (5 mg total) by mouth every evening.  Dispense: 90 tablet; Refill: 1 ?- CMP14+EGFR ?- CBC with Differential/Platelet ? ?2. Essential hypertension ?- benazepril-hydrochlorthiazide (LOTENSIN HCT) 20-25 MG tablet; Take 1 tablet by mouth daily.  Dispense: 90 tablet; Refill: 1 ?- CMP14+EGFR ?- CBC  with Differential/Platelet ? ?3. Gastroesophageal reflux disease, unspecified whether esophagitis present ?- pantoprazole (PROTONIX) 40 MG tablet; Take 1 tablet (40 mg total) by mouth daily.  Dispense: 90 tablet; Refill: 1 ?- CMP14+EGFR ?- CBC with Differential/Platelet ? ?4. Atrial fibrillation, unspecified type (HCC) ?- metoprolol tartrate (LOPRESSOR) 25 MG tablet; Take 1 tablet (25 mg total) by mouth 2 (two) times daily.  Dispense: 180 tablet; Refill: 1 ?- apixaban (ELIQUIS) 5 MG TABS tablet; Take 1 tablet (5 mg total) by mouth 2 (two) times daily.  Dispense: 180 tablet; Refill: 1 ?- CMP14+EGFR ?- CBC with Differential/Platelet ? ?5. Essential hypertension, benign ?- CMP14+EGFR ?- CBC with Differential/Platelet ? ?6. Aortic valve stenosis, etiology of cardiac valve disease unspecified ? ?- CMP14+EGFR ?- CBC with Differential/Platelet ? ?7. Paroxysmal atrial fibrillation (HCC) ? ?- CMP14+EGFR ?- CBC with Differential/Platelet ? ?8. Hypothyroidism, unspecified type ?- CMP14+EGFR ?- CBC with Differential/Platelet ?- TSH ? ?9. Iron deficiency anemia, unspecified iron deficiency anemia type ?- CMP14+EGFR ?- CBC with Differential/Platelet ? ?10. Obesity (BMI 30.0-34.9) ?- CMP14+EGFR ?- CBC with Differential/Platelet ? ?11. Bilateral impacted cerumen ? ? ? ?Labs pending ?Health Maintenance reviewed ?Diet and exercise encouraged ? ?Follow up plan: ?6 months  ? ? ?Evelina Dun, FNP ? ? ? ?

## 2021-05-27 NOTE — Patient Instructions (Signed)
Atrial Fibrillation  Atrial fibrillation is a type of irregular or rapid heartbeat (arrhythmia). In atrial fibrillation, the top part of the heart (atria) beats in an irregular pattern. This makes the heart unable to pump bloodnormally and effectively. The goal of treatment is to prevent blood clots from forming, control your heart rate, or restore your heartbeat to a normal rhythm. If this condition is not treated, it can cause serious problems, such as a weakened heart muscle (cardiomyopathy) or a stroke. What are the causes? This condition is often caused by medical conditions that damage the heart's electrical system. These include: High blood pressure (hypertension). This is the most common cause. Certain heart problems or conditions, such as heart failure, coronary artery disease, heart valve problems, or heart surgery. Diabetes. Overactive thyroid (hyperthyroidism). Obesity. Chronic kidney disease. In some cases, the cause of this condition is not known. What increases the risk? This condition is more likely to develop in: Older people. People who smoke. Athletes who do endurance exercise. People who have a family history of atrial fibrillation. Men. People who use drugs. People who drink a lot of alcohol. People who have lung conditions, such as emphysema, pneumonia, or COPD. People who have obstructive sleep apnea. What are the signs or symptoms? Symptoms of this condition include: A feeling that your heart is racing or beating irregularly. Discomfort or pain in your chest. Shortness of breath. Sudden light-headedness or weakness. Tiring easily during exercise or activity. Fatigue. Syncope (fainting). Sweating. In some cases, there are no symptoms. How is this diagnosed? Your health care provider may detect atrial fibrillation when taking your pulse. If detected, this condition may be diagnosed with: An electrocardiogram (ECG) to check electrical signals of the  heart. An ambulatory cardiac monitor to record your heart's activity for a few days. A transthoracic echocardiogram (TTE) to create pictures of your heart. A transesophageal echocardiogram (TEE) to create even closer pictures of your heart. A stress test to check your blood supply while you exercise. Imaging tests, such as a CT scan or chest X-ray. Blood tests. How is this treated? Treatment depends on underlying conditions and how you feel when you experience atrial fibrillation. This condition may be treated with: Medicines to prevent blood clots or to treat heart rate or heart rhythm problems. Electrical cardioversion to reset the heart's rhythm. A pacemaker to correct abnormal heart rhythm. Ablation to remove the heart tissue that sends abnormal signals. Left atrial appendage closure to seal the area where blood clots can form. In some cases, underlying conditions will be treated. Follow these instructions at home: Medicines Take over-the counter and prescription medicines only as told by your health care provider. Do not take any new medicines without talking to your health care provider. If you are taking blood thinners: Talk with your health care provider before you take any medicines that contain aspirin or NSAIDs, such as ibuprofen. These medicines increase your risk for dangerous bleeding. Take your medicine exactly as told, at the same time every day. Avoid activities that could cause injury or bruising, and follow instructions about how to prevent falls. Wear a medical alert bracelet or carry a card that lists what medicines you take. Lifestyle     Do not use any products that contain nicotine or tobacco, such as cigarettes, e-cigarettes, and chewing tobacco. If you need help quitting, ask your health care provider. Eat heart-healthy foods. Talk with a dietitian to make an eating plan that is right for you. Exercise regularly as told by   your health care provider. Do not  drink alcohol. Lose weight if you are overweight. Do not use drugs, including cannabis. General instructions If you have obstructive sleep apnea, manage your condition as told by your health care provider. Do not use diet pills unless your health care provider approves. Diet pills can make heart problems worse. Keep all follow-up visits as told by your health care provider. This is important. Contact a health care provider if you: Notice a change in the rate, rhythm, or strength of your heartbeat. Are taking a blood thinner and you notice more bruising. Tire more easily when you exercise or do heavy work. Have a sudden change in weight. Get help right away if you have:  Chest pain, abdominal pain, sweating, or weakness. Trouble breathing. Side effects of blood thinners, such as blood in your vomit, stool, or urine, or bleeding that cannot stop. Any symptoms of a stroke. "BE FAST" is an easy way to remember the main warning signs of a stroke: B - Balance. Signs are dizziness, sudden trouble walking, or loss of balance. E - Eyes. Signs are trouble seeing or a sudden change in vision. F - Face. Signs are sudden weakness or numbness of the face, or the face or eyelid drooping on one side. A - Arms. Signs are weakness or numbness in an arm. This happens suddenly and usually on one side of the body. S - Speech. Signs are sudden trouble speaking, slurred speech, or trouble understanding what people say. T - Time. Time to call emergency services. Write down what time symptoms started. Other signs of a stroke, such as: A sudden, severe headache with no known cause. Nausea or vomiting. Seizure. These symptoms may represent a serious problem that is an emergency. Do not wait to see if the symptoms will go away. Get medical help right away. Call your local emergency services (911 in the [place] Do not drive yourself to the hospital. Summary Atrial fibrillation is a type of irregular or rapid  heartbeat (arrhythmia). Symptoms include a feeling that your heart is beating fast or irregularly. You may be given medicines to prevent blood clots or to treat heart rate or heart rhythm problems. Get help right away if you have signs or symptoms of a stroke. Get help right away if you cannot catch your breath or have chest pain or pressure. This information is not intended to replace advice given to you by your health care provider. Make sure you discuss any questions you have with your healthcare provider. Document Revised: 08/29/2018 Document Reviewed: 08/29/2018 Elsevier Patient Education  2022 [employer]  

## 2021-05-28 LAB — CMP14+EGFR
ALT: 16 IU/L (ref 0–32)
AST: 19 IU/L (ref 0–40)
Albumin/Globulin Ratio: 1.7 (ref 1.2–2.2)
Albumin: 4.6 g/dL (ref 3.7–4.7)
Alkaline Phosphatase: 61 IU/L (ref 44–121)
BUN/Creatinine Ratio: 17 (ref 12–28)
BUN: 17 mg/dL (ref 8–27)
Bilirubin Total: 1.2 mg/dL (ref 0.0–1.2)
CO2: 25 mmol/L (ref 20–29)
Calcium: 10.1 mg/dL (ref 8.7–10.3)
Chloride: 103 mmol/L (ref 96–106)
Creatinine, Ser: 1.03 mg/dL — ABNORMAL HIGH (ref 0.57–1.00)
Globulin, Total: 2.7 g/dL (ref 1.5–4.5)
Glucose: 98 mg/dL (ref 70–99)
Potassium: 4.3 mmol/L (ref 3.5–5.2)
Sodium: 141 mmol/L (ref 134–144)
Total Protein: 7.3 g/dL (ref 6.0–8.5)
eGFR: 56 mL/min/{1.73_m2} — ABNORMAL LOW (ref >59)

## 2021-05-28 LAB — CBC WITH DIFFERENTIAL/PLATELET
Basophils Absolute: 0.1 10*3/uL (ref 0.0–0.2)
Basos: 2 %
EOS (ABSOLUTE): 0.3 10*3/uL (ref 0.0–0.4)
Eos: 4 %
Hematocrit: 43.3 % (ref 34.0–46.6)
Hemoglobin: 14.5 g/dL (ref 11.1–15.9)
Immature Grans (Abs): 0 10*3/uL (ref 0.0–0.1)
Immature Granulocytes: 1 %
Lymphocytes Absolute: 2.3 10*3/uL (ref 0.7–3.1)
Lymphs: 36 %
MCH: 30.4 pg (ref 26.6–33.0)
MCHC: 33.5 g/dL (ref 31.5–35.7)
MCV: 91 fL (ref 79–97)
Monocytes Absolute: 0.6 10*3/uL (ref 0.1–0.9)
Monocytes: 9 %
Neutrophils Absolute: 3.2 10*3/uL (ref 1.4–7.0)
Neutrophils: 48 %
Platelets: 278 10*3/uL (ref 150–450)
RBC: 4.77 x10E6/uL (ref 3.77–5.28)
RDW: 12.3 % (ref 11.7–15.4)
WBC: 6.5 10*3/uL (ref 3.4–10.8)

## 2021-05-28 LAB — TSH: TSH: 2.03 u[IU]/mL (ref 0.450–4.500)

## 2021-05-31 ENCOUNTER — Telehealth: Payer: Self-pay | Admitting: *Deleted

## 2021-05-31 MED ORDER — DIGOXIN 125 MCG PO TABS: 0.1250 mg | ORAL_TABLET | Freq: Every day | ORAL | 3 refills | Status: DC

## 2021-05-31 NOTE — Telephone Encounter (Signed)
pt aware of results  ?New script sent to the pharmacy  ?Follow up scheduled  ?

## 2021-05-31 NOTE — Telephone Encounter (Signed)
-----   Message from Minus Breeding, MD sent at 05/29/2021  1:07 PM EST ----- ?The AS was increased compared to previous.  I would like to add digoxin 0.125 mg PO daily and see me back in about 2 months.  Call Ms. Moen with the results and send results to Sharion Balloon, FNP ? ?

## 2021-05-31 NOTE — Telephone Encounter (Signed)
Left message for pt to call.

## 2021-06-10 ENCOUNTER — Ambulatory Visit (INDEPENDENT_AMBULATORY_CARE_PROVIDER_SITE_OTHER): Payer: PPO | Admitting: Family

## 2021-06-10 ENCOUNTER — Encounter: Payer: Self-pay | Admitting: Family

## 2021-06-10 VITALS — BP 113/74 | HR 96 | Temp 97.5°F | Ht 64.0 in | Wt 164.8 lb

## 2021-06-10 DIAGNOSIS — H6123 Impacted cerumen, bilateral: Secondary | ICD-10-CM | POA: Diagnosis not present

## 2021-06-10 NOTE — Progress Notes (Signed)
? ?  Subjective:  ? ? Patient ID: Autumn Moyer, female    DOB: Apr 04, 1940, 80 y.o.   MRN: [id number] ? ?Chief Complaint  ?Patient presents with  ? Cerumen Impaction  ? ?Pt presents to the office today with bilateral ear fullness.  ?Ear Fullness  ?There is pain in both ears. This is a new problem. The current episode started more than 1 month ago. The problem occurs constantly. The problem has been gradually worsening. There has been no fever. The pain is at a severity of 0/10. The patient is experiencing no pain. She has tried nothing for the symptoms. The treatment provided no relief.  ? ? ? ?Review of Systems  ?All other systems reviewed and are negative. ? ?   ?Objective:  ? Physical Exam ?Vitals reviewed.  ?Constitutional:   ?   General: She is not in acute distress. ?   Appearance: She is well-developed.  ?HENT:  ?   Head: Normocephalic and atraumatic.  ?   Right Ear: There is impacted cerumen.  ?   Left Ear: There is impacted cerumen.  ?Eyes:  ?   Pupils: Pupils are equal, round, and reactive to light.  ?Neck:  ?   Thyroid: No thyromegaly.  ?Cardiovascular:  ?   Rate and Rhythm: Normal rate and regular rhythm.  ?   Heart sounds: Normal heart sounds. No murmur heard. ?Pulmonary:  ?   Effort: Pulmonary effort is normal. No respiratory distress.  ?   Breath sounds: Normal breath sounds. No wheezing.  ?Abdominal:  ?   General: Bowel sounds are normal. There is no distension.  ?   Palpations: Abdomen is soft.  ?   Tenderness: There is no abdominal tenderness.  ?Musculoskeletal:     ?   General: No tenderness. Normal range of motion.  ?   Cervical back: Normal range of motion and neck supple.  ?Skin: ?   General: Skin is warm and dry.  ?Neurological:  ?   Mental Status: She is alert and oriented to person, place, and time.  ?   Cranial Nerves: No cranial nerve deficit.  ?   Deep Tendon Reflexes: Reflexes are normal and symmetric.  ?Psychiatric:     ?   Behavior: Behavior normal.     ?   Thought Content: Thought  content normal.     ?   Judgment: Judgment normal.  ? ? ?Bilateral ears washed with warm water and peroxide, TM WNL.  ? ?BP 113/74   Pulse 96   Temp (!) 97.5 ?F (36.4 ?C)   Ht '5\' 4"'$  (1.626 m)   Wt 164 lb 12.8 oz (74.8 kg)   SpO2 95%   BMI 28.29 kg/m?  ? ?   ?Assessment & Plan:  ?YENNIFER SEGOVIA comes in today with chief complaint of Cerumen Impaction ? ? ?Diagnosis and orders addressed: ? ?1. Bilateral impacted cerumen ?Bilateral ears washed and look good ?Use OTC Debrox drops as needed ?Follow up as needed ? ?Evelina Dun, FNP ? ? ? ?

## 2021-06-10 NOTE — Patient Instructions (Signed)
Earwax Buildup, Adult ?The ears produce a substance called earwax that helps keep bacteria out of the ear and protects the skin in the ear canal. Occasionally, earwax can build up in the ear and cause discomfort or hearing loss. ?What are the causes? ?This condition is caused by a buildup of earwax. Ear canals are self-cleaning. Ear wax is made in the outer part of the ear canal and generally falls out in small amounts over time. ?When the self-cleaning mechanism is not working, earwax builds up and can cause decreased hearing and discomfort. Attempting to clean ears with cotton swabs can push the earwax deep into the ear canal and cause decreased hearing and pain. ?What increases the risk? ?This condition is more likely to develop in people who: ?Clean their ears often with cotton swabs. ?Pick at their ears. ?Use earplugs or in-ear headphones often, or wear hearing aids. ?The following factors may also make you more likely to develop this condition: ?Being female. ?Being of older age. ?Naturally producing more earwax. ?Having narrow ear canals. ?Having earwax that is overly thick or sticky. ?Having excess hair in the ear canal. ?Having eczema. ?Being dehydrated. ?What are the signs or symptoms? ?Symptoms of this condition include: ?Reduced or muffled hearing. ?A feeling of fullness in the ear or feeling that the ear is plugged. ?Fluid coming from the ear. ?Ear pain or an itchy ear. ?Ringing in the ear. ?Coughing. ?Balance problems. ?An obvious piece of earwax that can be seen inside the ear canal. ?How is this diagnosed? ?This condition may be diagnosed based on: ?Your symptoms. ?Your medical history. ?An ear exam. During the exam, your health care provider will look into your ear with an instrument called an otoscope. ?You may have tests, including a hearing test. ?How is this treated? ?This condition may be treated by: ?Using ear drops to soften the earwax. ?Having the earwax removed by a health care provider. The  health care provider may: ?Flush the ear with water. ?Use an instrument that has a loop on the end (curette). ?Use a suction device. ?Having surgery to remove the wax buildup. This may be done in severe cases. ?Follow these instructions at home: ? ?Take over-the-counter and prescription medicines only as told by your health care provider. ?Do not put any objects, including cotton swabs, into your ear. You can clean the opening of your ear canal with a washcloth or facial tissue. ?Follow instructions from your health care provider about cleaning your ears. Do not overclean your ears. ?Drink enough fluid to keep your urine pale yellow. This will help to thin the earwax. ?Keep all follow-up visits as told. If earwax builds up in your ears often or if you use hearing aids, consider seeing your health care provider for routine, preventive ear cleanings. Ask your health care provider how often you should schedule your cleanings. ?If you have hearing aids, clean them according to instructions from the manufacturer and your health care provider. ?Contact a health care provider if: ?You have ear pain. ?You develop a fever. ?You have pus or other fluid coming from your ear. ?You have hearing loss. ?You have ringing in your ears that does not go away. ?You feel like the room is spinning (vertigo). ?Your symptoms do not improve with treatment. ?Get help right away if: ?You have bleeding from the affected ear. ?You have severe ear pain. ?Summary ?Earwax can build up in the ear and cause discomfort or hearing loss. ?The most common symptoms of this condition include   reduced or muffled hearing, a feeling of fullness in the ear, or feeling that the ear is plugged. ?This condition may be diagnosed based on your symptoms, your medical history, and an ear exam. ?This condition may be treated by using ear drops to soften the earwax or by having the earwax removed by a health care provider. ?Do not put any objects, including cotton  swabs, into your ear. You can clean the opening of your ear canal with a washcloth or facial tissue. ?This information is not intended to replace advice given to you by your health care provider. Make sure you discuss any questions you have with your health care provider. ?Document Revised: 06/25/2019 Document Reviewed: 06/25/2019 ?Elsevier Patient Education ? 2022 [employer] ? ?

## 2021-07-26 ENCOUNTER — Telehealth: Payer: Self-pay | Admitting: *Deleted

## 2021-07-26 DIAGNOSIS — I4891 Unspecified atrial fibrillation: Secondary | ICD-10-CM

## 2021-07-26 MED ORDER — METOPROLOL TARTRATE 25 MG PO TABS: 37.5000 mg | ORAL_TABLET | Freq: Two times a day (BID) | ORAL | 3 refills | Status: DC

## 2021-07-26 NOTE — Telephone Encounter (Signed)
Dr Percival Spanish reviewed the heart rates that were sent to the office by the patient. Heart rates ranged from 105 to114 bpm. Spoke with pt, she was instructed to increase her metoprolol to 37.5 mg twice daily. Patient voiced understanding and repeated to take 1 and 1/2 of the 25 mg tablets twice daily. New script sent to the pharmacy  ?

## 2021-08-09 NOTE — Progress Notes (Unsigned)
Cardiology Office Note   Date:  08/11/2021   ID:  Autumn, Moyer Jul 27, 1940, MRN [id number]  PCP:  Autumn Balloon, FNP  Cardiologist:   Autumn Breeding, MD   Chief Complaint  Patient presents with   Shortness of Breath      History of Present Illness: Autumn Moyer is a 81 y.o. female who presents for evaluation of aortic stenosis and new onset atrial fibrillation. Her AS was moderate in August in 2020.  Atrial fib was new prior to the last visit and confirmed on a follow up monitor.  Her AS was found to be moderate on echo.    At the last visit she had SOB.  Lexiscan Myoview was negative for ischemia.    Since I last saw her I increased her beta blocker to further control her heart rate.   The mean pressure across the aortic valve increased from 26.8 to 38 mm Hg.  Aortic valve area of 0.5 cm.  I brought her back to discuss this today.  She does get short of breath walking 25 yards on level ground or doing mild household chores.  This has been slowly progressive.  She has had stress at home because her husband had to have lower extremity bypass.  He actually has been fairly sick for several months and has had hospitalizations.  She has been taking care of him.  She is not describing chest pressure, neck or arm discomfort.  She is not describing palpitations, presyncope or syncope.  She might occasionally feel her atrial fibrillation.  She has had no weight gain or edema.   Past Medical History:  Diagnosis Date   Atrial fibrillation ([place])    Hypertension    Squamous cell carcinoma of skin 10/23/2013   in situ- left forearm ([id number])   Squamous cell carcinoma of skin 01/16/2018   in situ-right knee post (txpbx)   Thyroid disease     Past Surgical History:  Procedure Laterality Date   EYE SURGERY     TOE SURGERY     TUBAL LIGATION       Current Outpatient Medications  Medication Sig Dispense Refill   acetaminophen (TYLENOL) 325 MG tablet Take 650 mg by mouth every 6  (six) hours as needed.     apixaban (ELIQUIS) 5 MG TABS tablet Take 1 tablet (5 mg total) by mouth 2 (two) times daily. 180 tablet 1   benazepril-hydrochlorthiazide (LOTENSIN HCT) 20-25 MG tablet Take 1 tablet by mouth daily. 90 tablet 1   digoxin (LANOXIN) 0.125 MG tablet Take 1 tablet (0.125 mg total) by mouth daily. 90 tablet 3   levocetirizine (XYZAL) 5 MG tablet Take 1 tablet (5 mg total) by mouth every evening. 90 tablet 1   levothyroxine (SYNTHROID) 88 MCG tablet Take 1 tablet (88 mcg total) by mouth daily. 90 tablet 3   metoprolol tartrate (LOPRESSOR) 25 MG tablet Take 1.5 tablets (37.5 mg total) by mouth 2 (two) times daily. 270 tablet 3   mometasone (ELOCON) 0.1 % cream Apply 1 application topically daily. 45 g 2   Multiple Vitamin (MULTIVITAMIN) capsule Take 1 capsule by mouth daily.     Omega-3 Fatty Acids (FISH OIL) 500 MG CAPS Take 1,000 mg by mouth daily.     pantoprazole (PROTONIX) 40 MG tablet Take 1 tablet (40 mg total) by mouth daily. 90 tablet 1   tacrolimus (PROTOPIC) 0.1 % ointment Apply topically in the morning and at bedtime. 30 g 4  furosemide (LASIX) 20 MG tablet Take 1 tablet (20 mg total) by mouth daily as needed. (Patient not taking: Reported on 08/11/2021) 90 tablet 1   No current facility-administered medications for this visit.    Allergies:   Noroxin [norfloxacin] and Augmentin [amoxicillin-pot clavulanate]    ROS:  Please see the history of present illness.   Otherwise, review of systems are positive for none.   All other systems are reviewed and negative.    PHYSICAL EXAM: VS:  BP 122/73   Pulse 76   Ht '5\' 4"'$  (1.626 m)   Wt 165 lb (74.8 kg)   SpO2 96%   BMI 28.32 kg/m  , BMI Body mass index is 28.32 kg/m. GENERAL:  Well appearing NECK:  No jugular venous distention, waveform within normal limits, carotid upstroke slightly parvus at tardus, no bruits, no thyromegaly LUNGS:  Clear to auscultation bilaterally CHEST:  Unremarkable HEART:  PMI not  displaced or sustained,S1 and S2 within normal limits, no S3, no clicks, no rubs, 3 out of 6 late peaking systolic murmur, no diastolic murmurs, irregular ABD:  Flat, positive bowel sounds normal in frequency in pitch, no bruits, no rebound, no guarding, no midline pulsatile mass, no hepatomegaly, no splenomegaly EXT:  2 plus pulses throughout, no edema, no cyanosis no clubbing   EKG:  EKG is not ordered today.    Recent Labs: 05/27/2021: ALT 16; BUN 17; Creatinine, Ser 1.03; Hemoglobin 14.5; Platelets 278; Potassium 4.3; Sodium 141; TSH 2.030    Lipid Panel No results found for: CHOL, TRIG, HDL, CHOLHDL, VLDL, LDLCALC, LDLDIRECT    Wt Readings from Last 3 Encounters:  08/11/21 165 lb (74.8 kg)  06/10/21 164 lb 12.8 oz (74.8 kg)  05/27/21 165 lb 6.4 oz (75 kg)      Other studies Reviewed: Additional studies/ records that were reviewed today include: Echocardiogram Review of the above records demonstrates:    See elsewhere   ASSESSMENT AND PLAN:  AS:   This is now severe .  It has progressed significantly and I think she is stage D1.  We had a long conversation about valve replacement and I think she needs to be referred for TAVR.    HTN: Her blood pressure is at target.  No change in therapy.   SOB:   This is previously not been correlated with her atrial fib.  I think it is more likely related to the aortic stenosis as above.  No change in therapy.  She is not having any resting uncontrolled symptoms.   ATRIAL FIB: She has paroxysmal atrial fibrillation.   [name] has a CHA2DS2 - VASc score of 4.   She tolerates anticoagulation.  Rate is controlled.  She has very large left atrium probably would not maintain sinus rhythm.  No change in therapy.  Current medicines are reviewed at length with the patient today.  The patient does not have concerns regarding medicines.  The following changes have been made: None  Disposition:   Follow up me in after her appointment  with my structural heart disease colleagues.  Signed, Autumn Breeding, MD  08/11/2021 11:21 AM    [facility]

## 2021-08-11 ENCOUNTER — Encounter: Payer: Self-pay | Admitting: Cardiology

## 2021-08-11 ENCOUNTER — Ambulatory Visit (INDEPENDENT_AMBULATORY_CARE_PROVIDER_SITE_OTHER): Payer: PPO | Admitting: Cardiology

## 2021-08-11 VITALS — BP 122/73 | HR 76 | Ht 64.0 in | Wt 165.0 lb

## 2021-08-11 DIAGNOSIS — I10 Essential (primary) hypertension: Secondary | ICD-10-CM

## 2021-08-11 DIAGNOSIS — I480 Paroxysmal atrial fibrillation: Secondary | ICD-10-CM | POA: Diagnosis not present

## 2021-08-11 DIAGNOSIS — R0602 Shortness of breath: Secondary | ICD-10-CM | POA: Diagnosis not present

## 2021-08-11 DIAGNOSIS — I350 Nonrheumatic aortic (valve) stenosis: Secondary | ICD-10-CM

## 2021-08-11 NOTE — Patient Instructions (Addendum)
Medication Instructions:  The current medical regimen is effective;  continue present plan and medications.  *If you need a refill on your cardiac medications before your next appointment, please call your pharmacy*  You have been referred to see Dr Lenna Sciara and will be contacted to be scheduled.  Follow-Up: At [facility], you and your health needs are our priority.  As part of our continuing mission to provide you with exceptional heart care, we have created designated Provider Care Teams.  These Care Teams include your primary Cardiologist (physician) and Advanced Practice Providers (APPs -  Physician Assistants and Nurse Practitioners) who all work together to provide you with the care you need, when you need it.  We recommend signing up for the patient portal called "MyChart".  Sign up information is provided on this After Visit Summary.  MyChart is used to connect with patients for Virtual Visits (Telemedicine).  Patients are able to view lab/test results, encounter notes, upcoming appointments, etc.  Non-urgent messages can be sent to your provider as well.   To learn more about what you can do with MyChart, go to [email or web address].    Your next appointment:   3 month(s)  The format for your next appointment:   In Person  Provider:   Minus Breeding, MD{   Important Information About Sugar

## 2021-09-10 ENCOUNTER — Ambulatory Visit: Payer: PPO | Admitting: Internal Medicine

## 2021-09-10 ENCOUNTER — Encounter: Payer: Self-pay | Admitting: Internal Medicine

## 2021-09-10 VITALS — BP 120/70 | HR 82 | Ht 64.0 in | Wt 153.0 lb

## 2021-09-10 DIAGNOSIS — Z01818 Encounter for other preprocedural examination: Secondary | ICD-10-CM | POA: Diagnosis not present

## 2021-09-10 DIAGNOSIS — I350 Nonrheumatic aortic (valve) stenosis: Secondary | ICD-10-CM

## 2021-09-10 NOTE — H&P (View-Only) (Signed)
Patient ID: Autumn Moyer MRN: [id number] DOB/AGE: 79/13/1942 79 y.o.  Primary Care Physician:Hawks, Theador Hawthorne, FNP Primary Cardiologist: Minus Breeding, MD   FOCUSED CARDIOVASCULAR PROBLEM LIST:   1.  Severe aortic stenosis with aortic valve area 0.5 cm, mean gradient 38 mmHg, max velocity of 4 m/s with mild to moderate aortic regurgitation and normal ejection fraction; atrial fibrillation with no bundle-branch blocks 2.  Paroxysmal atrial fibrillation on anticoagulation 3.  Hypertension 4.  Hypothyroidism 5.  Stage III CKD  HISTORY OF PRESENT ILLNESS: The patient is a 81 y.o. female with the indicated medical history here for recommendations regarding severe symptomatic aortic stenosis.  The patient's saw Dr. Percival Spanish recently.  He had been monitoring her aortic stenosis.  Her most recent echocardiogram demonstrated development of severe aortic stenosis.  She reported dyspnea with doing activities of daily living and walking on level ground.  She denied any chest pain presyncope or syncope.  She denied any symptomatic palpitations.  The patient tells me that she has been short of breath for years.  This is gotten a little bit worse over the last 6 months or so.  The addition of digoxin to help with her atrial fibrillation has helped but she continues to be quite short of breath with exertion.  She does have stairs at her house she has trouble going up these.  She is never short of breath at rest.  She denies any exertional angina, presyncope, or syncope.  Her palpitations are improved on digoxin.  She denies any paroxysmal nocturnal dyspnea, orthopnea, or severe peripheral edema.  She has had no severe bleeding or bruising while on anticoagulation.  She has not required hospitalization or emergency room visit for any breathing issues fortunately.  She has not seen a dentist in several years and is unsure whether her teeth are healthy or not.    Past Surgical History:  Procedure  Laterality Date   EYE SURGERY     TOE SURGERY     TUBAL LIGATION      Family History  Problem Relation Age of Onset   Hypertension Mother    Hyperlipidemia Mother    Heart failure Mother        Age 69   Hyperlipidemia Father    Hypertension Father    Heart disease Father        died of MI after developoing cancer.   Lung cancer Father    Hypertension Sister    Breast cancer Daughter    Breast cancer Paternal Aunt     Social History   Socioeconomic History   Marital status: Married    Spouse name: Danny   Number of children: 2   Years of education: Not on file   Highest education level: Not on file  Occupational History   Not on file  Tobacco Use   Smoking status: Never    Passive exposure: Past   Smokeless tobacco: Never  Vaping Use   Vaping Use: Never used  Substance and Sexual Activity   Alcohol use: No    Alcohol/week: 0.0 standard drinks of alcohol   Drug use: No   Sexual activity: Yes    Partners: Female  Other Topics Concern   Not on file  Social History Narrative   Lives with husband.  Married x 56 years in 2022.   2 daughters   5 grand children   1 great grandchild.   Social Determinants of Health   Financial Resource Strain: Low Risk  (  02/03/2021)   Overall Financial Resource Strain (CARDIA)    Difficulty of Paying Living Expenses: Not hard at all  Food Insecurity: No Food Insecurity (02/03/2021)   Hunger Vital Sign    Worried About Running Out of Food in the Last Year: Never true    Ran Out of Food in the Last Year: Never true  Transportation Needs: No Transportation Needs (02/03/2021)   PRAPARE - Hydrologist (Medical): No    Lack of Transportation (Non-Medical): No  Physical Activity: Insufficiently Active (02/03/2021)   Exercise Vital Sign    Days of Exercise per Week: 3 days    Minutes of Exercise per Session: 20 min  Stress: No Stress Concern Present (02/03/2021)   [place]    Feeling of Stress : Not at all  Social Connections: [place] (02/03/2021)   Social Connection and Isolation Panel [NHANES]    Frequency of Communication with Friends and Family: More than three times a week    Frequency of Social Gatherings with Friends and Family: More than three times a week    Attends Religious Services: More than 4 times per year    Active Member of [employer] or Organizations: Yes    Attends Archivist Meetings: More than 4 times per year    Marital Status: Married  Human resources officer Violence: Not At Risk (02/03/2021)   Humiliation, Afraid, Rape, and Kick questionnaire    Fear of Current or Ex-Partner: No    Emotionally Abused: No    Physically Abused: No    Sexually Abused: No     Prior to Admission medications   Medication Sig Start Date End Date Taking? Authorizing Provider  acetaminophen (TYLENOL) 325 MG tablet Take 650 mg by mouth every 6 (six) hours as needed.    [provider]  apixaban (ELIQUIS) 5 MG TABS tablet Take 1 tablet (5 mg total) by mouth 2 (two) times daily. 05/27/21   Evelina Dun A, FNP  benazepril-hydrochlorthiazide (LOTENSIN HCT) 20-25 MG tablet Take 1 tablet by mouth daily. 05/27/21   Sharion Balloon, FNP  digoxin (LANOXIN) 0.125 MG tablet Take 1 tablet (0.125 mg total) by mouth daily. 05/31/21   Minus Breeding, MD  furosemide (LASIX) 20 MG tablet Take 1 tablet (20 mg total) by mouth daily as needed. Patient not taking: Reported on 08/11/2021 05/27/21 08/25/21  Sharion Balloon, FNP  levocetirizine (XYZAL) 5 MG tablet Take 1 tablet (5 mg total) by mouth every evening. 05/27/21   Sharion Balloon, FNP  levothyroxine (SYNTHROID) 88 MCG tablet Take 1 tablet (88 mcg total) by mouth daily. 05/27/21 05/27/22  Evelina Dun A, FNP  metoprolol tartrate (LOPRESSOR) 25 MG tablet Take 1.5 tablets (37.5 mg total) by mouth 2 (two) times daily. 07/26/21   Minus Breeding, MD  mometasone (ELOCON) 0.1 % cream  Apply 1 application topically daily. 02/10/20   Sharion Balloon, FNP  Multiple Vitamin (MULTIVITAMIN) capsule Take 1 capsule by mouth daily.    [provider]  Omega-3 Fatty Acids (FISH OIL) 500 MG CAPS Take 1,000 mg by mouth daily.    [provider]  pantoprazole (PROTONIX) 40 MG tablet Take 1 tablet (40 mg total) by mouth daily. 05/27/21   Evelina Dun A, FNP  tacrolimus (PROTOPIC) 0.1 % ointment Apply topically in the morning and at bedtime. 03/12/20   Lavonna Monarch, MD    Allergies  Allergen Reactions   Noroxin [Norfloxacin] Nausea  And Vomiting   Augmentin [Amoxicillin-Pot Clavulanate] Other (See Comments)    Gums swelling, lips numb    REVIEW OF SYSTEMS:  General: no fevers/chills/night sweats Eyes: no blurry vision, diplopia, or amaurosis ENT: no sore throat or hearing loss Resp: no cough, wheezing, or hemoptysis CV: no edema or palpitations GI: no abdominal pain, nausea, vomiting, diarrhea, or constipation GU: no dysuria, frequency, or hematuria Skin: no rash Neuro: no headache, numbness, tingling, or weakness of extremities Musculoskeletal: no joint pain or swelling Heme: no bleeding, DVT, or easy bruising Endo: no polydipsia or polyuria  BP 120/70 (BP Location: Left Arm, Patient Position: Sitting, Cuff Size: Normal)   Pulse 82   Ht '5\' 4"'$  (1.626 m)   Wt 153 lb (69.4 kg)   SpO2 94%   BMI 26.26 kg/m   PHYSICAL EXAM: GEN:  AO x 3 in no acute distress HEENT: normal Dentition: Relatively intact Neck: JVP normal. +2 carotid upstrokes without bruits. No thyromegaly. Lungs: equal expansion, clear bilaterally CV: Apex is discrete and nondisplaced, RRR with 3/6 SEM Abd: soft, non-tender, non-distended; no bruit; positive bowel sounds Ext: no edema, ecchymoses, or cyanosis Vascular: 2+ femoral pulses, 2+ radial pulses       Skin: warm and dry without rash Neuro: CN II-XII grossly intact; motor and sensory grossly intact    DATA AND  STUDIES:  EKG: Atrial fibrillation without bundle-branch blocks  2D ECHO: Severe arctic stenosis with a mean gradient of 38 mmHg, aortic valve area 0.5 cm, and a peak velocity of 4 m/s with mild to moderate aortic regurgitation  CARDIAC CATH: Pending  STS RISK CALCULATOR: Pending  NHYA CLASS: 2    ASSESSMENT AND PLAN:   Nonrheumatic aortic valve stenosis  The patient has developed severe symptomatic aortic stenosis (stage D1).  Despite very good treatment for her atrial fibrillation with the addition of digoxin she continues to be symptomatic.  I think she is a good candidate for an aortic valve intervention.  We did speak about the evaluation including coronary angiography, CT scan, and cardiothoracic surgical consult.  She would like to pursue this.  We will also refer her for a dental evaluation given her unclear dental health.  Further recommendations will follow after the results of these tests are available.  I have personally reviewed the patients imaging data as summarized above.  I have reviewed the natural history of aortic stenosis with the patient and family members who are present today. We have discussed the limitations of medical therapy and the poor prognosis associated with symptomatic aortic stenosis. We have also reviewed potential treatment options, including palliative medical therapy, conventional surgical aortic valve replacement, and transcatheter aortic valve replacement. We discussed treatment options in the context of this patient's specific comorbid medical conditions.   All of the patient's questions were answered today. Will make further recommendations based on the results of studies outlined above.   Total time spent with patient today 60 minutes. This includes reviewing records, evaluating the patient and coordinating care.   Early Osmond, MD  09/10/2021 4:15 PM    [facility] [address], [place], [place]  [address] Phone:  [phone]; Fax: [phone]

## 2021-09-10 NOTE — Progress Notes (Addendum)
Pre Surgical Assessment: 5 M Walk Test  45M=16.70f  5 Meter Walk Test- trial 1: 7.84 seconds 5 Meter Walk Test- trial 2: 7.5 seconds 5 Meter Walk Test- trial 3: 6.96 seconds 5 Meter Walk Test Average: 7.43 seconds   Procedure: Isolated AVR Risk of Mortality: 2.350% Renal Failure: 2.699% Permanent Stroke: 1.880% Prolonged Ventilation: 6.594% DSW Infection: 0.118% Reoperation: 3.893% Morbidity or Mortality: 12.029% Short Length of Stay: 30.855% Long Length of Stay: 7.234%

## 2021-09-10 NOTE — Progress Notes (Addendum)
Patient ID: Autumn Moyer MRN: [id number] DOB/AGE: 07/28/1940 81 y.o.  Primary Care Physician:Hawks, Theador Hawthorne, FNP Primary Cardiologist: Minus Breeding, MD   FOCUSED CARDIOVASCULAR PROBLEM LIST:   1.  Severe aortic stenosis with aortic valve area 0.5 cm, mean gradient 38 mmHg, max velocity of 4 m/s with mild to moderate aortic regurgitation and normal ejection fraction; atrial fibrillation with no bundle-branch blocks 2.  Paroxysmal atrial fibrillation on anticoagulation 3.  Hypertension 4.  Hypothyroidism 5.  Stage III CKD  HISTORY OF PRESENT ILLNESS: The patient is a 81 y.o. female with the indicated medical history here for recommendations regarding severe symptomatic aortic stenosis.  The patient's saw Dr. Percival Spanish recently.  He had been monitoring her aortic stenosis.  Her most recent echocardiogram demonstrated development of severe aortic stenosis.  She reported dyspnea with doing activities of daily living and walking on level ground.  She denied any chest pain presyncope or syncope.  She denied any symptomatic palpitations.  The patient tells me that she has been short of breath for years.  This is gotten a little bit worse over the last 6 months or so.  The addition of digoxin to help with her atrial fibrillation has helped but she continues to be quite short of breath with exertion.  She does have stairs at her house she has trouble going up these.  She is never short of breath at rest.  She denies any exertional angina, presyncope, or syncope.  Her palpitations are improved on digoxin.  She denies any paroxysmal nocturnal dyspnea, orthopnea, or severe peripheral edema.  She has had no severe bleeding or bruising while on anticoagulation.  She has not required hospitalization or emergency room visit for any breathing issues fortunately.  She has not seen a dentist in several years and is unsure whether her teeth are healthy or not.    Past Surgical History:  Procedure  Laterality Date   EYE SURGERY     TOE SURGERY     TUBAL LIGATION      Family History  Problem Relation Age of Onset   Hypertension Mother    Hyperlipidemia Mother    Heart failure Mother        Age 79   Hyperlipidemia Father    Hypertension Father    Heart disease Father        died of MI after developoing cancer.   Lung cancer Father    Hypertension Sister    Breast cancer Daughter    Breast cancer Paternal Aunt     Social History   Socioeconomic History   Marital status: Married    Spouse name: Danny   Number of children: 2   Years of education: Not on file   Highest education level: Not on file  Occupational History   Not on file  Tobacco Use   Smoking status: Never    Passive exposure: Past   Smokeless tobacco: Never  Vaping Use   Vaping Use: Never used  Substance and Sexual Activity   Alcohol use: No    Alcohol/week: 0.0 standard drinks of alcohol   Drug use: No   Sexual activity: Yes    Partners: Female  Other Topics Concern   Not on file  Social History Narrative   Lives with husband.  Married x 56 years in 2022.   2 daughters   5 grand children   1 great grandchild.   Social Determinants of Health   Financial Resource Strain: Low Risk  (  02/03/2021)   Overall Financial Resource Strain (CARDIA)    Difficulty of Paying Living Expenses: Not hard at all  Food Insecurity: No Food Insecurity (02/03/2021)   Hunger Vital Sign    Worried About Running Out of Food in the Last Year: Never true    Ran Out of Food in the Last Year: Never true  Transportation Needs: No Transportation Needs (02/03/2021)   PRAPARE - Hydrologist (Medical): No    Lack of Transportation (Non-Medical): No  Physical Activity: Insufficiently Active (02/03/2021)   Exercise Vital Sign    Days of Exercise per Week: 3 days    Minutes of Exercise per Session: 20 min  Stress: No Stress Concern Present (02/03/2021)   [place]    Feeling of Stress : Not at all  Social Connections: [place] (02/03/2021)   Social Connection and Isolation Panel [NHANES]    Frequency of Communication with Friends and Family: More than three times a week    Frequency of Social Gatherings with Friends and Family: More than three times a week    Attends Religious Services: More than 4 times per year    Active Member of [employer] or Organizations: Yes    Attends Archivist Meetings: More than 4 times per year    Marital Status: Married  Human resources officer Violence: Not At Risk (02/03/2021)   Humiliation, Afraid, Rape, and Kick questionnaire    Fear of Current or Ex-Partner: No    Emotionally Abused: No    Physically Abused: No    Sexually Abused: No     Prior to Admission medications   Medication Sig Start Date End Date Taking? Authorizing Provider  acetaminophen (TYLENOL) 325 MG tablet Take 650 mg by mouth every 6 (six) hours as needed.    [provider]  apixaban (ELIQUIS) 5 MG TABS tablet Take 1 tablet (5 mg total) by mouth 2 (two) times daily. 05/27/21   Evelina Dun A, FNP  benazepril-hydrochlorthiazide (LOTENSIN HCT) 20-25 MG tablet Take 1 tablet by mouth daily. 05/27/21   Sharion Balloon, FNP  digoxin (LANOXIN) 0.125 MG tablet Take 1 tablet (0.125 mg total) by mouth daily. 05/31/21   Minus Breeding, MD  furosemide (LASIX) 20 MG tablet Take 1 tablet (20 mg total) by mouth daily as needed. Patient not taking: Reported on 08/11/2021 05/27/21 08/25/21  Sharion Balloon, FNP  levocetirizine (XYZAL) 5 MG tablet Take 1 tablet (5 mg total) by mouth every evening. 05/27/21   Sharion Balloon, FNP  levothyroxine (SYNTHROID) 88 MCG tablet Take 1 tablet (88 mcg total) by mouth daily. 05/27/21 05/27/22  Evelina Dun A, FNP  metoprolol tartrate (LOPRESSOR) 25 MG tablet Take 1.5 tablets (37.5 mg total) by mouth 2 (two) times daily. 07/26/21   Minus Breeding, MD  mometasone (ELOCON) 0.1 % cream  Apply 1 application topically daily. 02/10/20   Sharion Balloon, FNP  Multiple Vitamin (MULTIVITAMIN) capsule Take 1 capsule by mouth daily.    [provider]  Omega-3 Fatty Acids (FISH OIL) 500 MG CAPS Take 1,000 mg by mouth daily.    [provider]  pantoprazole (PROTONIX) 40 MG tablet Take 1 tablet (40 mg total) by mouth daily. 05/27/21   Evelina Dun A, FNP  tacrolimus (PROTOPIC) 0.1 % ointment Apply topically in the morning and at bedtime. 03/12/20   Lavonna Monarch, MD    Allergies  Allergen Reactions   Noroxin [Norfloxacin] Nausea  And Vomiting   Augmentin [Amoxicillin-Pot Clavulanate] Other (See Comments)    Gums swelling, lips numb    REVIEW OF SYSTEMS:  General: no fevers/chills/night sweats Eyes: no blurry vision, diplopia, or amaurosis ENT: no sore throat or hearing loss Resp: no cough, wheezing, or hemoptysis CV: no edema or palpitations GI: no abdominal pain, nausea, vomiting, diarrhea, or constipation GU: no dysuria, frequency, or hematuria Skin: no rash Neuro: no headache, numbness, tingling, or weakness of extremities Musculoskeletal: no joint pain or swelling Heme: no bleeding, DVT, or easy bruising Endo: no polydipsia or polyuria  BP 120/70 (BP Location: Left Arm, Patient Position: Sitting, Cuff Size: Normal)   Pulse 82   Ht '5\' 4"'$  (1.626 m)   Wt 153 lb (69.4 kg)   SpO2 94%   BMI 26.26 kg/m   PHYSICAL EXAM: GEN:  AO x 3 in no acute distress HEENT: normal Dentition: Relatively intact Neck: JVP normal. +2 carotid upstrokes without bruits. No thyromegaly. Lungs: equal expansion, clear bilaterally CV: Apex is discrete and nondisplaced, RRR with 3/6 SEM Abd: soft, non-tender, non-distended; no bruit; positive bowel sounds Ext: no edema, ecchymoses, or cyanosis Vascular: 2+ femoral pulses, 2+ radial pulses       Skin: warm and dry without rash Neuro: CN II-XII grossly intact; motor and sensory grossly intact    DATA AND  STUDIES:  EKG: Atrial fibrillation without bundle-branch blocks  2D ECHO: Severe arctic stenosis with a mean gradient of 38 mmHg, aortic valve area 0.5 cm, and a peak velocity of 4 m/s with mild to moderate aortic regurgitation  CARDIAC CATH: Pending  STS RISK CALCULATOR: Pending  NHYA CLASS: 2    ASSESSMENT AND PLAN:   Nonrheumatic aortic valve stenosis  The patient has developed severe symptomatic aortic stenosis (stage D1).  Despite very good treatment for her atrial fibrillation with the addition of digoxin she continues to be symptomatic.  I think she is a good candidate for an aortic valve intervention.  We did speak about the evaluation including coronary angiography, CT scan, and cardiothoracic surgical consult.  She would like to pursue this.  We will also refer her for a dental evaluation given her unclear dental health.  Further recommendations will follow after the results of these tests are available.  I have personally reviewed the patients imaging data as summarized above.  I have reviewed the natural history of aortic stenosis with the patient and family members who are present today. We have discussed the limitations of medical therapy and the poor prognosis associated with symptomatic aortic stenosis. We have also reviewed potential treatment options, including palliative medical therapy, conventional surgical aortic valve replacement, and transcatheter aortic valve replacement. We discussed treatment options in the context of this patient's specific comorbid medical conditions.   All of the patient's questions were answered today. Will make further recommendations based on the results of studies outlined above.   Total time spent with patient today 60 minutes. This includes reviewing records, evaluating the patient and coordinating care.   Early Osmond, MD  09/10/2021 4:15 PM    [facility] [address], [place], [place]  [address] Phone:  [phone]; Fax: [phone]

## 2021-09-11 LAB — BASIC METABOLIC PANEL
BUN/Creatinine Ratio: 21 (ref 12–28)
BUN: 18 mg/dL (ref 8–27)
CO2: 26 mmol/L (ref 20–29)
Calcium: 9.6 mg/dL (ref 8.7–10.3)
Chloride: 102 mmol/L (ref 96–106)
Creatinine, Ser: 0.86 mg/dL (ref 0.57–1.00)
Glucose: 105 mg/dL — ABNORMAL HIGH (ref 70–99)
Potassium: 4.3 mmol/L (ref 3.5–5.2)
Sodium: 143 mmol/L (ref 134–144)
eGFR: 69 mL/min/{1.73_m2} (ref >59)

## 2021-09-11 LAB — CBC
Hematocrit: 43.1 % (ref 34.0–46.6)
Hemoglobin: 14.9 g/dL (ref 11.1–15.9)
MCH: 31.3 pg (ref 26.6–33.0)
MCHC: 34.6 g/dL (ref 31.5–35.7)
MCV: 91 fL (ref 79–97)
Platelets: 277 10*3/uL (ref 150–450)
RBC: 4.76 x10E6/uL (ref 3.77–5.28)
RDW: 12.6 % (ref 11.7–15.4)
WBC: 9.9 10*3/uL (ref 3.4–10.8)

## 2021-09-13 ENCOUNTER — Other Ambulatory Visit: Payer: Self-pay

## 2021-09-13 DIAGNOSIS — I350 Nonrheumatic aortic (valve) stenosis: Secondary | ICD-10-CM

## 2021-09-17 ENCOUNTER — Other Ambulatory Visit: Payer: Self-pay

## 2021-09-17 DIAGNOSIS — I350 Nonrheumatic aortic (valve) stenosis: Secondary | ICD-10-CM

## 2021-09-20 ENCOUNTER — Telehealth: Payer: Self-pay | Admitting: *Deleted

## 2021-09-20 NOTE — Telephone Encounter (Signed)
Cardiac Catheterization scheduled at [facility] for: Wednesday September 22, 2021 11:30 AM Arrival time and place: [place] Entrance A at: 9:30 AM   Nothing to eat after midnight prior to procedure, clear liquids until 5 AM day of procedure.  Medication instructions: -Hold:  Eliquis-none 09/20/21 until post procedure  Lasix-AM of procedure  Benazepril/HCT-AM of procedure  -Except hold medications usual morning medications can be taken with sips of water including aspirin 81 mg.  Confirmed patient has responsible adult to drive home post procedure and be with patient first 24 hours after arriving home.  Patient reports no new symptoms concerning for COVID-19 in the past 10 days.  Reviewed procedure instructions with patient.

## 2021-09-22 ENCOUNTER — Other Ambulatory Visit: Payer: Self-pay

## 2021-09-22 ENCOUNTER — Ambulatory Visit (HOSPITAL_COMMUNITY)
Admission: RE | Admit: 2021-09-22 | Discharge: 2021-09-22 | Disposition: A | Discharge status: Home / Self Care | Payer: PPO | Attending: Internal Medicine | Admitting: Internal Medicine

## 2021-09-22 ENCOUNTER — Encounter (HOSPITAL_COMMUNITY)
Admission: RE | Disposition: A | Discharge status: Home / Self Care | Payer: Self-pay | Source: Home / Self Care | Attending: Internal Medicine

## 2021-09-22 DIAGNOSIS — I350 Nonrheumatic aortic (valve) stenosis: Secondary | ICD-10-CM

## 2021-09-22 DIAGNOSIS — R0600 Dyspnea, unspecified: Secondary | ICD-10-CM | POA: Insufficient documentation

## 2021-09-22 DIAGNOSIS — N183 Chronic kidney disease, stage 3 unspecified: Secondary | ICD-10-CM | POA: Diagnosis not present

## 2021-09-22 DIAGNOSIS — I2510 Atherosclerotic heart disease of native coronary artery without angina pectoris: Secondary | ICD-10-CM | POA: Diagnosis not present

## 2021-09-22 DIAGNOSIS — E039 Hypothyroidism, unspecified: Secondary | ICD-10-CM | POA: Insufficient documentation

## 2021-09-22 DIAGNOSIS — I480 Paroxysmal atrial fibrillation: Secondary | ICD-10-CM | POA: Insufficient documentation

## 2021-09-22 DIAGNOSIS — I129 Hypertensive chronic kidney disease with stage 1 through stage 4 chronic kidney disease, or unspecified chronic kidney disease: Secondary | ICD-10-CM | POA: Diagnosis not present

## 2021-09-22 DIAGNOSIS — Z7901 Long term (current) use of anticoagulants: Secondary | ICD-10-CM | POA: Insufficient documentation

## 2021-09-22 HISTORY — PX: ABDOMINAL AORTOGRAM: CATH118222

## 2021-09-22 HISTORY — PX: RIGHT HEART CATH AND CORONARY/GRAFT ANGIOGRAPHY: CATH118265

## 2021-09-22 LAB — POCT I-STAT EG7
Acid-Base Excess: 1 mmol/L (ref 0.0–2.0)
Acid-Base Excess: 2 mmol/L (ref 0.0–2.0)
Bicarbonate: 27.5 mmol/L (ref 20.0–28.0)
Bicarbonate: 28.8 mmol/L — ABNORMAL HIGH (ref 20.0–28.0)
Calcium, Ion: 1.19 mmol/L (ref 1.15–1.40)
Calcium, Ion: 1.23 mmol/L (ref 1.15–1.40)
HCT: 38 % (ref 36.0–46.0)
HCT: 39 % (ref 36.0–46.0)
Hemoglobin: 12.9 g/dL (ref 12.0–15.0)
Hemoglobin: 13.3 g/dL (ref 12.0–15.0)
O2 Saturation: 71 %
O2 Saturation: 71 %
Potassium: 3.5 mmol/L (ref 3.5–5.1)
Potassium: 3.6 mmol/L (ref 3.5–5.1)
Sodium: 141 mmol/L (ref 135–145)
Sodium: 142 mmol/L (ref 135–145)
TCO2: 29 mmol/L (ref 22–32)
TCO2: 30 mmol/L (ref 22–32)
pCO2, Ven: 50.6 mmHg (ref 44–60)
pCO2, Ven: 52.8 mmHg (ref 44–60)
pH, Ven: 7.343 (ref 7.25–7.43)
pH, Ven: 7.345 (ref 7.25–7.43)
pO2, Ven: 40 mmHg (ref 32–45)
pO2, Ven: 40 mmHg (ref 32–45)

## 2021-09-22 LAB — POCT I-STAT 7, (LYTES, BLD GAS, ICA,H+H)
Acid-Base Excess: 1 mmol/L (ref 0.0–2.0)
Bicarbonate: 26.8 mmol/L (ref 20.0–28.0)
Calcium, Ion: 1.23 mmol/L (ref 1.15–1.40)
HCT: 39 % (ref 36.0–46.0)
Hemoglobin: 13.3 g/dL (ref 12.0–15.0)
O2 Saturation: 83 %
Potassium: 3.5 mmol/L (ref 3.5–5.1)
Sodium: 142 mmol/L (ref 135–145)
TCO2: 28 mmol/L (ref 22–32)
pCO2 arterial: 48.5 mmHg — ABNORMAL HIGH (ref 32–48)
pH, Arterial: 7.35 (ref 7.35–7.45)
pO2, Arterial: 50 mmHg — ABNORMAL LOW (ref 83–108)

## 2021-09-22 SURGERY — RIGHT HEART CATH AND CORONARY/GRAFT ANGIOGRAPHY
Anesthesia: LOCAL

## 2021-09-22 MED ORDER — SODIUM CHLORIDE 0.9 % WEIGHT BASED INFUSION: 1.0000 mL/kg/h | INTRAVENOUS | Status: DC

## 2021-09-22 MED ORDER — HEPARIN SODIUM (PORCINE) 1000 UNIT/ML IJ SOLN
INTRAMUSCULAR | Status: DC | PRN
  Administered 2021-09-22: 5000 [IU] via INTRAVENOUS

## 2021-09-22 MED ORDER — FENTANYL CITRATE (PF) 100 MCG/2ML IJ SOLN
INTRAMUSCULAR | Status: AC
  Filled 2021-09-22: qty 2

## 2021-09-22 MED ORDER — IOHEXOL 350 MG/ML SOLN
INTRAVENOUS | Status: DC | PRN
  Administered 2021-09-22: 90 mL

## 2021-09-22 MED ORDER — SODIUM CHLORIDE 0.9% FLUSH: 3.0000 mL | INTRAVENOUS | Status: DC | PRN

## 2021-09-22 MED ORDER — SODIUM CHLORIDE 0.9 % IV SOLN: 250.0000 mL | INTRAVENOUS | Status: DC | PRN

## 2021-09-22 MED ORDER — HEPARIN (PORCINE) IN NACL 1000-0.9 UT/500ML-% IV SOLN
INTRAVENOUS | Status: AC
  Filled 2021-09-22: qty 500

## 2021-09-22 MED ORDER — FENTANYL CITRATE (PF) 100 MCG/2ML IJ SOLN
INTRAMUSCULAR | Status: DC | PRN
  Administered 2021-09-22: 25 ug via INTRAVENOUS

## 2021-09-22 MED ORDER — SODIUM CHLORIDE 0.9% FLUSH
3.0000 mL | Freq: Two times a day (BID) | INTRAVENOUS | Status: DC
Start: 2021-09-22 — End: 2021-09-22

## 2021-09-22 MED ORDER — SODIUM CHLORIDE 0.9 % IV SOLN: INTRAVENOUS | Status: DC

## 2021-09-22 MED ORDER — VERAPAMIL HCL 2.5 MG/ML IV SOLN
INTRAVENOUS | Status: DC | PRN
  Administered 2021-09-22: 10 mL via INTRA_ARTERIAL

## 2021-09-22 MED ORDER — LIDOCAINE HCL (PF) 1 % IJ SOLN
INTRAMUSCULAR | Status: AC
  Filled 2021-09-22: qty 30

## 2021-09-22 MED ORDER — ONDANSETRON HCL 4 MG/2ML IJ SOLN: 4.0000 mg | Freq: Four times a day (QID) | INTRAMUSCULAR | Status: DC | PRN

## 2021-09-22 MED ORDER — HEPARIN (PORCINE) IN NACL 1000-0.9 UT/500ML-% IV SOLN
INTRAVENOUS | Status: DC | PRN
  Administered 2021-09-22 (×2): 500 mL

## 2021-09-22 MED ORDER — LABETALOL HCL 5 MG/ML IV SOLN: 10.0000 mg | INTRAVENOUS | Status: DC | PRN

## 2021-09-22 MED ORDER — SODIUM CHLORIDE 0.9% FLUSH: 3.0000 mL | Freq: Two times a day (BID) | INTRAVENOUS | Status: DC

## 2021-09-22 MED ORDER — ACETAMINOPHEN 325 MG PO TABS: 650.0000 mg | ORAL_TABLET | ORAL | Status: DC | PRN

## 2021-09-22 MED ORDER — SODIUM CHLORIDE 0.9 % WEIGHT BASED INFUSION
3.0000 mL/kg/h | INTRAVENOUS | Status: AC
  Administered 2021-09-22: 3 mL/kg/h via INTRAVENOUS

## 2021-09-22 MED ORDER — ASPIRIN 81 MG PO CHEW: 81.0000 mg | CHEWABLE_TABLET | ORAL | Status: DC

## 2021-09-22 MED ORDER — VERAPAMIL HCL 2.5 MG/ML IV SOLN
INTRAVENOUS | Status: AC
  Filled 2021-09-22: qty 2

## 2021-09-22 MED ORDER — HEPARIN SODIUM (PORCINE) 1000 UNIT/ML IJ SOLN
INTRAMUSCULAR | Status: AC
  Filled 2021-09-22: qty 10

## 2021-09-22 MED ORDER — HYDRALAZINE HCL 20 MG/ML IJ SOLN: 10.0000 mg | INTRAMUSCULAR | Status: DC | PRN

## 2021-09-22 MED ORDER — MIDAZOLAM HCL 2 MG/2ML IJ SOLN
INTRAMUSCULAR | Status: DC | PRN
  Administered 2021-09-22: 1 mg via INTRAVENOUS

## 2021-09-22 MED ORDER — MIDAZOLAM HCL 2 MG/2ML IJ SOLN
INTRAMUSCULAR | Status: AC
  Filled 2021-09-22: qty 2

## 2021-09-22 MED ORDER — LIDOCAINE HCL (PF) 1 % IJ SOLN
INTRAMUSCULAR | Status: DC | PRN
  Administered 2021-09-22: 6 mL

## 2021-09-22 SURGICAL SUPPLY — 19 items
BAND CMPR LRG ZPHR (HEMOSTASIS) ×1
BAND ZEPHYR COMPRESS 30 LONG (HEMOSTASIS) ×1 IMPLANT
CATH BALLN WEDGE 5F 110CM (CATHETERS) ×1 IMPLANT
CATH INFINITI 6F ANG MULTIPACK (CATHETERS) ×1 IMPLANT
CATH INFINITI 6F FL3.5 (CATHETERS) ×1 IMPLANT
DEVICE TORQUE .025-.038 (MISCELLANEOUS) ×1 IMPLANT
GLIDESHEATH SLEND SS 6F .021 (SHEATH) ×1 IMPLANT
GUIDEWIRE .025 260CM (WIRE) ×1 IMPLANT
GUIDEWIRE INQWIRE 1.5J.035X260 (WIRE) IMPLANT
INQWIRE 1.5J .035X260CM (WIRE) ×2
KIT HEART LEFT (KITS) ×2 IMPLANT
PACK CARDIAC CATHETERIZATION (CUSTOM PROCEDURE TRAY) ×2 IMPLANT
SHEATH GLIDE SLENDER 4/5FR (SHEATH) ×1 IMPLANT
SYR MEDRAD MARK 7 150ML (SYRINGE) ×2 IMPLANT
TRANSDUCER W/STOPCOCK (MISCELLANEOUS) ×2 IMPLANT
TUBING CIL FLEX 10 FLL-RA (TUBING) ×2 IMPLANT
TUBING CONTRAST HIGH PRESS 48 (TUBING) ×1 IMPLANT
WIRE ASAHI PROWATER 300CM (WIRE) ×1 IMPLANT
WIRE EMERALD 3MM-J .025X260CM (WIRE) ×1 IMPLANT

## 2021-09-22 NOTE — Interval H&P Note (Signed)
History and Physical Interval Note:  09/22/2021 10:42 AM  Autumn Moyer  has presented today for surgery, with the diagnosis of TAVR AS.  The various methods of treatment have been discussed with the patient and family. After consideration of risks, benefits and other options for treatment, the patient has consented to  Procedure(s): RIGHT/LEFT HEART CATH AND CORONARY ANGIOGRAPHY (N/A) as a surgical intervention.  The patient's history has been reviewed, patient examined, no change in status, stable for surgery.  I have reviewed the patient's chart and labs.  Questions were answered to the patient's satisfaction.     Early Osmond

## 2021-09-23 ENCOUNTER — Encounter (HOSPITAL_COMMUNITY): Payer: Self-pay | Admitting: Internal Medicine

## 2021-09-27 ENCOUNTER — Ambulatory Visit (INDEPENDENT_AMBULATORY_CARE_PROVIDER_SITE_OTHER): Payer: PPO | Admitting: Dentistry

## 2021-09-27 ENCOUNTER — Encounter (HOSPITAL_COMMUNITY): Payer: Self-pay | Admitting: Dentistry

## 2021-09-27 VITALS — BP 128/82 | HR 71 | Temp 98.2°F

## 2021-09-27 DIAGNOSIS — Z01818 Encounter for other preprocedural examination: Secondary | ICD-10-CM

## 2021-09-27 DIAGNOSIS — K117 Disturbances of salivary secretion: Secondary | ICD-10-CM

## 2021-09-27 DIAGNOSIS — Z7901 Long term (current) use of anticoagulants: Secondary | ICD-10-CM

## 2021-09-27 DIAGNOSIS — K0530 Chronic periodontitis, unspecified: Secondary | ICD-10-CM

## 2021-09-27 DIAGNOSIS — K029 Dental caries, unspecified: Secondary | ICD-10-CM | POA: Diagnosis not present

## 2021-09-27 DIAGNOSIS — K045 Chronic apical periodontitis: Secondary | ICD-10-CM | POA: Diagnosis not present

## 2021-09-27 DIAGNOSIS — K036 Deposits [accretions] on teeth: Secondary | ICD-10-CM | POA: Diagnosis not present

## 2021-09-27 DIAGNOSIS — K0850 Unsatisfactory restoration of tooth, unspecified: Secondary | ICD-10-CM

## 2021-09-27 DIAGNOSIS — K08109 Complete loss of teeth, unspecified cause, unspecified class: Secondary | ICD-10-CM | POA: Diagnosis not present

## 2021-09-27 DIAGNOSIS — F40232 Fear of other medical care: Secondary | ICD-10-CM | POA: Diagnosis not present

## 2021-09-27 DIAGNOSIS — M270 Developmental disorders of jaws: Secondary | ICD-10-CM | POA: Diagnosis not present

## 2021-09-27 DIAGNOSIS — K083 Retained dental root: Secondary | ICD-10-CM

## 2021-09-27 DIAGNOSIS — K06020 Generalized gingival recession, unspecified: Secondary | ICD-10-CM | POA: Diagnosis not present

## 2021-09-27 DIAGNOSIS — K030 Excessive attrition of teeth: Secondary | ICD-10-CM | POA: Diagnosis not present

## 2021-09-27 DIAGNOSIS — I350 Nonrheumatic aortic (valve) stenosis: Secondary | ICD-10-CM | POA: Diagnosis not present

## 2021-09-27 NOTE — Progress Notes (Signed)
Department of Dental Medicine   Service Date:   09/27/2021  Patient Name:  Autumn Moyer Date of Birth:   12/27/1940 Medical Record Number: [id number]  Referring Provider:           Lenna Sciara, M.D.  OUTPATIENT CONSULTATION PLAN/RECOMMENDATIONS   ASSESSMENT: There are no current signs of acute odontogenic infection including abscess, edema or erythema, or suspicious lesion requiring biopsy.   Caries, retained tooth roots, chronic apical periodontitis; periodontal concerns that include accretions on teeth & bone loss.  RECOMMENDATIONS: Extractions of all indicated teeth to decrease the risk of perioperative and postoperative systemic infection and complications.   Establish dental care at an outside office of the patient's choice for routine care including replacement of missing teeth as needed. Discuss plans to return to the dentist for elective/routine dental care after heart surgery & medical optimization w/ medical team for new antibiotic prophylaxis recommendations.  PLAN: Discuss case with medical team & coordinate treatment as needed.   O.R. on Thursday, September 7th for multiple extractions under general anesthesia (pending medical team's recommendations).  We will use her office visit with Dr. Cyndia Bent as her updated H&P (per patient- earlier O.R. dates were offered, however pt has scheduling conflicts).   Eliquis can be held 24 hours prior to dental procedure & resumed 24 hours after.  Discussed in detail all treatment options and recommendations with the patient and they are agreeable to the plan.    Thank you for consulting with [facility] and for the opportunity to participate in this patient's treatment.  Should you have any questions or concerns, please contact the [facility] at [phone].   09/27/2021  HISTORY OF PRESENT ILLNESS: Autumn Moyer is a very pleasant 81 y.o. female with history of HTN, atrial fibrillation on anticoagulation  (Eliquis), skin cancer, seasonal allergies, hypothyroidism and stage III chronic kidney disease who was recently diagnosed with severe aortic stenosis and is anticipating TAVR.  Her appointment (initial consult) with Dr. Cyndia Bent is on 11/17/21.  She had her cath done last week with her cardiologist Dr. Ali Lowe.     The patient presents today for a medically necessary dental consultation as part of their pre-cardiac surgery work-up.   DENTAL HISTORY: The patient reports that it has been about 2 years since she has last seen a dentist.  She was going to [place] at that time and she stated that she quit going there because of the multiple different dental providers who she felt were giving her different treatment options/plans each time she went and she did not appreciate the inconsistency in her care.  She currently denies any dental/orofacial pain or sensitivity. Patient is able to manage oral secretions.  Patient denies dysphagia, odynophagia, dysphonia, SOB and neck pain.  Patient denies fever, rigors and malaise.   CHIEF COMPLAINT:  Here for a preoperative dental exam.   Patient Active Problem List   Diagnosis Date Noted   SOB (shortness of breath) 03/22/2021   Large hiatal hernia 07/17/2020   Precordial chest pain 04/14/2020   Atrial fibrillation ([place]) 02/10/2020   Aortic stenosis 10/09/2018   Prediabetes 06/29/2016   Obesity (BMI 30.0-34.9) 11/27/2015   Allergic rhinitis 05/22/2015   Iron deficiency anemia 05/22/2015   Murmur 09/05/2014   Essential hypertension, benign 11/15/2013   Esophageal reflux 11/15/2013   Hypothyroid 06/11/2010   Diverticular disease 06/11/2010   Post-menopausal 06/11/2010   Past Medical History:  Diagnosis Date   Atrial fibrillation ([place])    Hypertension  Squamous cell carcinoma of skin 10/23/2013   in situ- left forearm (CX35FU)   Squamous cell carcinoma of skin 01/16/2018   in situ-right knee post (txpbx)   Thyroid disease    Past  Surgical History:  Procedure Laterality Date   ABDOMINAL AORTOGRAM N/A 09/22/2021   Procedure: ABDOMINAL AORTOGRAM;  Surgeon: Early Osmond, MD;  Location: [facility];  Service: Cardiovascular;  Laterality: N/A;   EYE SURGERY     RIGHT HEART CATH AND CORONARY/GRAFT ANGIOGRAPHY N/A 09/22/2021   Procedure: RIGHT HEART CATH AND CORONARY/GRAFT ANGIOGRAPHY;  Surgeon: Early Osmond, MD;  Location: [place];  Service: Cardiovascular;  Laterality: N/A;   TOE SURGERY     TUBAL LIGATION     Allergies  Allergen Reactions   Noroxin [Norfloxacin] Nausea And Vomiting   Augmentin [Amoxicillin-Pot Clavulanate] Other (See Comments)    Gums swelling, lips numb   Current Outpatient Medications  Medication Sig Dispense Refill   acetaminophen (TYLENOL) 325 MG tablet Take 650 mg by mouth every 6 (six) hours as needed (Arthritis pain).     apixaban (ELIQUIS) 5 MG TABS tablet Take 1 tablet (5 mg total) by mouth 2 (two) times daily. 180 tablet 1   benazepril-hydrochlorthiazide (LOTENSIN HCT) 20-25 MG tablet Take 1 tablet by mouth daily. 90 tablet 1   digoxin (LANOXIN) 0.125 MG tablet Take 1 tablet (0.125 mg total) by mouth daily. 90 tablet 3   furosemide (LASIX) 20 MG tablet Take 1 tablet (20 mg total) by mouth daily as needed. (Patient taking differently: Take 20 mg by mouth daily as needed for fluid or edema.) 90 tablet 1   levocetirizine (XYZAL) 5 MG tablet Take 1 tablet (5 mg total) by mouth every evening. 90 tablet 1   levothyroxine (SYNTHROID) 88 MCG tablet Take 1 tablet (88 mcg total) by mouth daily. 90 tablet 3   metoprolol tartrate (LOPRESSOR) 25 MG tablet Take 1.5 tablets (37.5 mg total) by mouth 2 (two) times daily. 270 tablet 3   mometasone (ELOCON) 0.1 % cream Apply 1 application topically daily. (Patient taking differently: Apply 1 application  topically daily as needed (in ears).) 45 g 2   Multiple Vitamin (MULTIVITAMIN) capsule Take 1 capsule by mouth daily.     Omega-3 Fatty  Acids (FISH OIL) 500 MG CAPS Take 1,000 mg by mouth daily.     pantoprazole (PROTONIX) 40 MG tablet Take 1 tablet (40 mg total) by mouth daily. 90 tablet 1   Polyvinyl Alcohol-Povidone PF (REFRESH) 1.4-0.6 % SOLN Place 2 drops into both eyes every morning.     tacrolimus (PROTOPIC) 0.1 % ointment Apply topically in the morning and at bedtime. (Patient taking differently: Apply 1 Application topically in the morning and at bedtime. Eye lids) 30 g 4   No current facility-administered medications for this visit.    LABS: Lab Results  Component Value Date   WBC 9.9 09/10/2021   HGB 12.9 09/22/2021   HCT 38.0 09/22/2021   MCV 91 09/10/2021   PLT 277 09/10/2021      Component Value Date/Time   NA 142 09/22/2021 1129   NA 143 09/10/2021 1644   K 3.5 09/22/2021 1129   CL 102 09/10/2021 1644   CO2 26 09/10/2021 1644   GLUCOSE 105 (H) 09/10/2021 1644   BUN 18 09/10/2021 1644   CREATININE 0.86 09/10/2021 1644   CALCIUM 9.6 09/10/2021 1644   GFRNONAA 60 04/13/2020 0918   GFRAA 69 04/13/2020 0918   No results found  for: "INR", "PROTIME" No results found for: "PTT"  Social History   Socioeconomic History   Marital status: Married    Spouse name: Danny   Number of children: 2   Years of education: Not on file   Highest education level: Not on file  Occupational History   Not on file  Tobacco Use   Smoking status: Never    Passive exposure: Past   Smokeless tobacco: Never  Vaping Use   Vaping Use: Never used  Substance and Sexual Activity   Alcohol use: No    Alcohol/week: 0.0 standard drinks of alcohol   Drug use: No   Sexual activity: Yes    Partners: Female  Other Topics Concern   Not on file  Social History Narrative   Lives with husband.  Married x 56 years in 2022.   2 daughters   5 grand children   1 great grandchild.   Social Determinants of Health   Financial Resource Strain: Low Risk  (02/03/2021)   Overall Financial Resource Strain (CARDIA)    Difficulty  of Paying Living Expenses: Not hard at all  Food Insecurity: No Food Insecurity (02/03/2021)   Hunger Vital Sign    Worried About Running Out of Food in the Last Year: Never true    Ran Out of Food in the Last Year: Never true  Transportation Needs: No Transportation Needs (02/03/2021)   PRAPARE - Hydrologist (Medical): No    Lack of Transportation (Non-Medical): No  Physical Activity: Insufficiently Active (02/03/2021)   Exercise Vital Sign    Days of Exercise per Week: 3 days    Minutes of Exercise per Session: 20 min  Stress: No Stress Concern Present (02/03/2021)   [place]    Feeling of Stress : Not at all  Social Connections: [place] (02/03/2021)   Social Connection and Isolation Panel [NHANES]    Frequency of Communication with Friends and Family: More than three times a week    Frequency of Social Gatherings with Friends and Family: More than three times a week    Attends Religious Services: More than 4 times per year    Active Member of [employer] or Organizations: Yes    Attends Music therapist: More than 4 times per year    Marital Status: Married  Human resources officer Violence: Not At Risk (02/03/2021)   Humiliation, Afraid, Rape, and Kick questionnaire    Fear of Current or Ex-Partner: No    Emotionally Abused: No    Physically Abused: No    Sexually Abused: No   Family History  Problem Relation Age of Onset   Hypertension Mother    Hyperlipidemia Mother    Heart failure Mother        Age 24   Hyperlipidemia Father    Hypertension Father    Heart disease Father        died of MI after developoing cancer.   Lung cancer Father    Hypertension Sister    Breast cancer Daughter    Breast cancer Paternal Aunt      REVIEW OF SYSTEMS:  Reviewed with the patient as per HPI. PSYCH:  [++] Dental phobia   VITAL SIGNS: BP 128/82 (BP Location: Right  Arm, Patient Position: Sitting, Cuff Size: Normal)   Pulse 71   Temp 98.2 F (36.8 C) (Oral)    PHYSICAL EXAM: GENERAL:  Well-developed, comfortable and in no apparent distress.  NEUROLOGICAL:  Alert and oriented to person, place and  time. EXTRAORAL:  Facial symmetry present without any edema or erythema.  No swelling or lymphadenopathy.  TMJ asymptomatic without clicks or crepitations.  INTRAORAL:  Soft tissues appear well-perfused.  FOM and vestibules soft and not raised. Oral cavity without mass or lesion. No signs of infection, parulis, sinus tract, edema or erythema evident upon exam.  Palatal tori. [+] Mucous membranes:  Dry   DENTAL EXAM: Hard tissue exam completed and charted.    OVERALL IMPRESSION:  Fair remaining dentition.       ORAL HYGIENE:  Poor     PERIODONTAL:  Pink, healthy gingival tissue with blunted papilla with areas of inflamed, erythematous gingival tissue.  Localized calculus accumulation. [+] Recession:  Generalized CARIES:  #2, #3, #12, #13, #14, #15, #19, #22, #27 and #31 RETAINED ROOT TIPS:  #13, #31 DEFECTIVE RESTORATIONS:  #2 existing amalgams with recurrent decay/open mesial margin; #3 large existing amalgam with recurrent decay and mesial overhang onto root surface; #7 missing distal restoration; #12 recurrent decay under existing crown on distal; #14 existing amalgam with recurrent decay; #19 existing crown with severe recurrent decay. ENDODONTICS:  #11 previous RCT with definitive restoration FIXED PROSTHODONTICS:  #4, #12, #19, #29 and #30 have PFM crowns; #11 has porcelain/ceramic crown OCCLUSION:  Unable to assess molar occlusion Non-functional teeth:  #2, #15 Supra-erupted teeth:  #2, #3, #14, #15 OTHER FINDINGS:   [+] Attrition/wear:  #23-#26 incisal   RADIOGRAPHIC EXAM:  PAN and Full Mouth Series exposed and interpreted.  Condyles seated bilaterally in fossas.  No evidence of abnormal pathology.  All visualized osseous structures appear  WNL. Missing teeth #'s 1, 16, 17, 18, 21 & 32.  #2, #3, #14 & #15 appear supra-erupted. #20 drifting mesial.   Generalized mild horizontal bone loss consistent with mild periodontitis.  Slight radiographic calculus evident. Existing restorations on #2, #3, #5, #7, #8, #9, #10, #14, #15, #20 & #28; #28M, #47M & #78M w/ recurrent decay; #47M has large mesial overhang onto root surface.  Existing crowns (full-coverage) on #'s 4, 11, 12, 19, 29 & 30; #12 w/ possible recurrent distal decay vs open margin, #19 w/ severe recurrent decay approximating the pulp.  Retained root tips #13 & #31.  Periapical radiolucencies evident on #'s 3, 13, 19, 30 & 31; #3 & #30 shows signs of external root resorption of M&D roots.  #11 has been previously endodontically treated w/ gutta percha ~1 mm short of apex & adequate fill (appears to be).  Caries-  #17M, #22D & #44M.     ASSESSMENT:  1.  Severe aortic stenosis 2.  Preoperative dental exam 3.  Long-term (current) use of anticoagulation 4.  Caries 5.  Retained root tips 6.  Missing teeth 7.  Chronic apical periodontitis 8.  Accretions on teeth 9.  Dental phobia 10.  Postoperative bleeding risk 11.  Palatal tori 12.  Chronic periodontitis 13.  Gingival recession 14.  Defective dental restorations 15.  Attrition/wear 16.  Clinical xerostomia   PLAN AND RECOMMENDATIONS: I discussed the risks, benefits, and complications of various scenarios with the patient in relationship to their medical and dental conditions, which included systemic infection such as endocarditis, bacteremia or other serious issues that could potentially occur either before, during or after their anticipated heart surgery if dental/oral concerns are not addressed.  I explained that if any chronic or acute dental/oral infection(s) are addressed and subsequently not maintained following medical optimization and recovery, their risk  of the previously mentioned complications are just as high and  could potentially occur postoperatively.  I explained all significant findings of the dental consultation with the patient including several teeth that are broken down and infected into bone (retained roots #13 and #31) and grossly decayed/non-restorable tooth #19 and the recommended care including extractions of all hopeless or infected teeth in order to optimize them for heart surgery from a dental standpoint.  The patient verbalized understanding of all findings, discussion, and recommendations.   OF NOTE:  During the patient's appointment, our [facility] was not registering with our computer.  IT attempted to fix this prior to the patient's departure, however it took longer to fix than expected, so the patient's Full Mouth Series was not reviewed with the patient or by me until after the patient's visit.  The patient is aware that findings on the Full Mouth Series may alter her treatment plan if there are any signs of infection or large/severe caries on teeth that were not discussed at today's visit for extraction.  She verbalized understanding.  I explained that I would contact her once her radiographs are reviewed and explain any findings should they differ from today's visit prior to her scheduled dental procedure. We then discussed various treatment options to include no treatment, multiple extractions with alveoloplasty, pre-prosthetic surgery as indicated, periodontal therapy, dental restorations, root canal therapy, crown and bridge therapy, implant therapy, and replacement of missing teeth as indicated.  The patient verbalized understanding of all options, and currently wishes to proceed with multiple extractions of teeth as recommended/indicated in order to prepare her for her heart valve surgery (all indicated teeth to be extracted are pending review of Full Mouth Series- at least teeth numbers 13, 19 and 31). Plan to discuss all findings and recommendations with medical team and coordinate  future care as needed.  Discussed potential operating room date for September 7th at 0730 to complete all dental work under general anesthesia due to the patient's medical conditions and severe dental phobia.  Discussed earlier dates with the patient, however these interferred with her husband's rehab/physical therapy for his COPD.  She would need an updated H&P after July 23rd, so the patient elected to use Dr. Vivi Martens appointment on 11/17/21 as H&P and have dental extractions the following week.   The patient will need to establish care at a dental office of her choice for routine dental care including replacement of missing teeth as needed, cleanings and exams.  Recommend discussing plans to return to the dentist for elective dental care following her heart valve surgery and medical optimization with her medical doctor/team for new antibiotic prophylaxis recommendations.  She verbalized understanding.   All questions and concerns were invited and addressed.  The patient tolerated today's visit well and departed in stable condition.      I spent in excess of 120 minutes during the conduct of this consultation and >50% of this time involved direct face-to-face encounter for counseling and/or coordination of the patient's care.      - Ashan Cueva B. Benson [place], DMD

## 2021-09-27 NOTE — Patient Instructions (Signed)
[facility] DEPARTMENT OF DENTAL MEDICINE Dr. Latham Kinzler B. Adelaido Nicklaus, DMD Phone: [phone] Fax: [phone]       It was a pleasure seeing you today!  Please refer to the information below regarding your dental visit with us.  Please do not hesitate to give us a call if any questions or concerns come up after you leave.    Thank you for letting us provide care for you.  If there is anything we can do for you, please let us know.    HEART VALVES AND MOUTH CARE  FACTS: If you have any infection in your mouth, it can infect your heart valve. If you heart valve is infected, you will be seriously ill. Infections in the mouth can be SILENT and do not always cause pain. Examples of infections in the mouth are gum disease, dental cavities and abscesses. Some possible signs of infection are:  Bad breath, bleeding gums, or teeth that are sensitive to sweets, hot, and/or cold. There are many other signs as well.     WHAT YOU HAVE TO DO: Brush your teeth after meals and at bedtime.  Spend at least 2 minutes brushing well, especially behind your back teeth and all around your teeth that stand alone.  Brush at the gumline also. Do not go to bed without brushing your teeth and flossing.  If your gums bleed when you brush or floss, do NOT stop brushing or flossing.  Bleeding can be a sign of inflammation or irritation from bacteria.  It usually means that your gums need more attention and better cleaning.  If your dentist or Dr. Ger Nicks gave you a prescription mouthwash to use, make sure to use it as directed. If you run out of the medication, notify your pharmacy. If you were given any other medications or directions by your dentist, please follow them.  If you did not understand the directions or forget what you were told, please call.  We will be happy to refresh your memory. If you need antibiotics before dental procedures, make sure you take them one hour prior to  every dental visit as directed.  Get a dental check-up every 4-6 months in order to keep your mouth healthy, or to find and treat any new infection. You will most likely need your teeth cleaned or gums treated at the same time. If you are not able to come in for your scheduled appointment, call your dentist as soon as possible to reschedule. If you have a problem in between dental visits, call your dentist.    WE ARE A TEAM.  OUR GOAL IS: HEALTHY MOUTH, HEALTHY HEART    Questions?  Call our office during office hours at [phone].   

## 2021-09-28 DIAGNOSIS — Z01818 Encounter for other preprocedural examination: Secondary | ICD-10-CM | POA: Insufficient documentation

## 2021-09-28 DIAGNOSIS — K06020 Generalized gingival recession, unspecified: Secondary | ICD-10-CM | POA: Insufficient documentation

## 2021-09-28 DIAGNOSIS — K030 Excessive attrition of teeth: Secondary | ICD-10-CM | POA: Insufficient documentation

## 2021-09-28 DIAGNOSIS — K083 Retained dental root: Secondary | ICD-10-CM | POA: Insufficient documentation

## 2021-09-28 DIAGNOSIS — M270 Developmental disorders of jaws: Secondary | ICD-10-CM | POA: Insufficient documentation

## 2021-09-28 DIAGNOSIS — Z7901 Long term (current) use of anticoagulants: Secondary | ICD-10-CM | POA: Insufficient documentation

## 2021-09-28 DIAGNOSIS — K117 Disturbances of salivary secretion: Secondary | ICD-10-CM | POA: Insufficient documentation

## 2021-09-28 DIAGNOSIS — F40232 Fear of other medical care: Secondary | ICD-10-CM | POA: Insufficient documentation

## 2021-09-28 DIAGNOSIS — K0850 Unsatisfactory restoration of tooth, unspecified: Secondary | ICD-10-CM | POA: Insufficient documentation

## 2021-09-28 DIAGNOSIS — K08109 Complete loss of teeth, unspecified cause, unspecified class: Secondary | ICD-10-CM | POA: Insufficient documentation

## 2021-09-28 DIAGNOSIS — K0530 Chronic periodontitis, unspecified: Secondary | ICD-10-CM | POA: Insufficient documentation

## 2021-09-28 DIAGNOSIS — K045 Chronic apical periodontitis: Secondary | ICD-10-CM | POA: Insufficient documentation

## 2021-09-28 DIAGNOSIS — K036 Deposits [accretions] on teeth: Secondary | ICD-10-CM | POA: Insufficient documentation

## 2021-09-28 DIAGNOSIS — K029 Dental caries, unspecified: Secondary | ICD-10-CM | POA: Insufficient documentation

## 2021-10-01 ENCOUNTER — Ambulatory Visit (HOSPITAL_COMMUNITY)
Admission: RE | Admit: 2021-10-01 | Discharge: 2021-10-01 | Disposition: A | Discharge status: Home / Self Care | Payer: PPO | Source: Ambulatory Visit | Attending: Internal Medicine | Admitting: Internal Medicine

## 2021-10-01 DIAGNOSIS — I700 Atherosclerosis of aorta: Secondary | ICD-10-CM | POA: Diagnosis not present

## 2021-10-01 DIAGNOSIS — I350 Nonrheumatic aortic (valve) stenosis: Secondary | ICD-10-CM | POA: Diagnosis not present

## 2021-10-01 DIAGNOSIS — J9811 Atelectasis: Secondary | ICD-10-CM | POA: Diagnosis not present

## 2021-10-01 DIAGNOSIS — K449 Diaphragmatic hernia without obstruction or gangrene: Secondary | ICD-10-CM | POA: Diagnosis not present

## 2021-10-01 MED ORDER — IOHEXOL 350 MG/ML SOLN
100.0000 mL | Freq: Once | INTRAVENOUS | Status: AC | PRN
  Administered 2021-10-01: 100 mL via INTRAVENOUS

## 2021-10-07 ENCOUNTER — Encounter: Payer: Self-pay | Admitting: Physician Assistant

## 2021-10-27 ENCOUNTER — Other Ambulatory Visit: Payer: Self-pay | Admitting: Family

## 2021-10-27 DIAGNOSIS — Z1231 Encounter for screening mammogram for malignant neoplasm of breast: Secondary | ICD-10-CM

## 2021-11-17 ENCOUNTER — Encounter: Payer: Self-pay | Admitting: Surgery

## 2021-11-17 ENCOUNTER — Institutional Professional Consult (permissible substitution): Payer: PPO | Admitting: Surgery

## 2021-11-17 ENCOUNTER — Ambulatory Visit: Payer: PPO | Admitting: Cardiology

## 2021-11-17 VITALS — BP 153/89 | HR 74 | Resp 18 | Ht 64.0 in

## 2021-11-17 DIAGNOSIS — I350 Nonrheumatic aortic (valve) stenosis: Secondary | ICD-10-CM

## 2021-11-17 NOTE — H&P (View-Only) (Signed)
Patient ID: Autumn Moyer, female   DOB: 09/21/1940, 81 y.o.   MRN: [id number]  [facility]       [address]       [place] [address]             [phone]          CARDIOTHORACIC SURGERY CONSULTATION REPORT  PCP is Sharion Balloon, FNP Referring Provider is Lenna Sciara, MD Primary Cardiologist is Minus Breeding, MD  Reason for consultation:  Severe aortic stenosis  HPI:  The patient is a 81 year old woman with a history of hypertension, paroxysmal atrial fibrillation on anticoagulation, hypothyroidism, and severe aortic stenosis who was referred for consideration of transcatheter aortic valve replacement.  She has been followed with periodic echocardiograms by Dr. Percival Spanish since 2016.  Her mean aortic valve gradient was 27 mmHg in January 2022.  The mean gradient had increased to 30 mmHg on her most recent echo in March of this year.  The peak gradient was 49 mmHg with a valve area by VTI of 0.5 cm.  There was mild to moderate aortic regurgitation.  Left ventricular ejection fraction was 55 to 60%.  There was trivial mitral regurgitation.  She has developed shortness of breath and fatigue with exertion.  She reports some shortness of breath for several years but has gotten worse over the past 6 months.  She said that digoxin was added to her medication regimen for atrial fibrillation and has made her feel better.  She has had shortness of breath and fatigue with doing her daily activities and walking upstairs.  She denies any dizziness or syncope.  She has had no chest discomfort.  She denies peripheral edema and orthopnea.  She had not seen a dentist in years and was seen by Dr. Benson Norway and scheduled for dental extractions on 11/25/2021.  She is married and lives with her husband in [place].  She has 2 adult children.  Past Medical History:  Diagnosis Date   Atrial fibrillation ([place])     Hypertension    Squamous cell carcinoma of skin 01/16/2018   in situ-right knee post (txpbx)   Thyroid disease     Past Surgical History:  Procedure Laterality Date   ABDOMINAL AORTOGRAM N/A 09/22/2021   Procedure: ABDOMINAL AORTOGRAM;  Surgeon: Early Osmond, MD;  Location: [facility];  Service: Cardiovascular;  Laterality: N/A;   EYE SURGERY     RIGHT HEART CATH AND CORONARY/GRAFT ANGIOGRAPHY N/A 09/22/2021   Procedure: RIGHT HEART CATH AND CORONARY/GRAFT ANGIOGRAPHY;  Surgeon: Early Osmond, MD;  Location: [facility];  Service: Cardiovascular;  Laterality: N/A;   TOE SURGERY     TUBAL LIGATION      Family History  Problem Relation Age of Onset   Hypertension Mother    Hyperlipidemia Mother    Heart failure Mother        Age 41   Hyperlipidemia Father    Hypertension Father    Heart disease Father        died of MI after developoing cancer.   Lung cancer Father    Hypertension Sister    Breast cancer Daughter    Breast cancer Paternal Aunt     Social History   Socioeconomic History   Marital status: Married    Spouse name: Kasandra Knudsen   Number of children: 2   Years of education: Not on file  Highest education level: Not on file  Occupational History   Not on file  Tobacco Use   Smoking status: Never    Passive exposure: Past   Smokeless tobacco: Never  Vaping Use   Vaping Use: Never used  Substance and Sexual Activity   Alcohol use: No    Alcohol/week: 0.0 standard drinks of alcohol   Drug use: No   Sexual activity: Yes    Partners: Female  Other Topics Concern   Not on file  Social History Narrative   Lives with husband.  Married x 56 years in 2022.   2 daughters   5 grand children   1 great grandchild.   Social Determinants of Health   Financial Resource Strain: Low Risk  (02/03/2021)   Overall Financial Resource Strain (CARDIA)    Difficulty of Paying Living Expenses: Not hard at all  Food Insecurity: No Food Insecurity  (02/03/2021)   Hunger Vital Sign    Worried About Running Out of Food in the Last Year: Never true    Ran Out of Food in the Last Year: Never true  Transportation Needs: No Transportation Needs (02/03/2021)   PRAPARE - Hydrologist (Medical): No    Lack of Transportation (Non-Medical): No  Physical Activity: Insufficiently Active (02/03/2021)   Exercise Vital Sign    Days of Exercise per Week: 3 days    Minutes of Exercise per Session: 20 min  Stress: No Stress Concern Present (02/03/2021)   [place]    Feeling of Stress : Not at all  Social Connections: [place] (02/03/2021)   Social Connection and Isolation Panel [NHANES]    Frequency of Communication with Friends and Family: More than three times a week    Frequency of Social Gatherings with Friends and Family: More than three times a week    Attends Religious Services: More than 4 times per year    Active Member of [employer] or Organizations: Yes    Attends Archivist Meetings: More than 4 times per year    Marital Status: Married  Human resources officer Violence: Not At Risk (02/03/2021)   Humiliation, Afraid, Rape, and Kick questionnaire    Fear of Current or Ex-Partner: No    Emotionally Abused: No    Physically Abused: No    Sexually Abused: No    Prior to Admission medications   Medication Sig Start Date End Date Taking? Authorizing Provider  acetaminophen (TYLENOL) 325 MG tablet Take 650 mg by mouth every 6 (six) hours as needed (Arthritis pain).   Yes [provider]  apixaban (ELIQUIS) 5 MG TABS tablet Take 1 tablet (5 mg total) by mouth 2 (two) times daily. 05/27/21  Yes Hawks, Christy A, FNP  benazepril-hydrochlorthiazide (LOTENSIN HCT) 20-25 MG tablet Take 1 tablet by mouth daily. 05/27/21  Yes Hawks, Christy A, FNP  digoxin (LANOXIN) 0.125 MG tablet Take 1 tablet (0.125 mg total) by mouth daily. 05/31/21   Yes Minus Breeding, MD  levocetirizine (XYZAL) 5 MG tablet Take 1 tablet (5 mg total) by mouth every evening. 05/27/21  Yes Hawks, Christy A, FNP  levothyroxine (SYNTHROID) 88 MCG tablet Take 1 tablet (88 mcg total) by mouth daily. 05/27/21 05/27/22 Yes Hawks, Christy A, FNP  metoprolol tartrate (LOPRESSOR) 25 MG tablet Take 1.5 tablets (37.5 mg total) by mouth 2 (two) times daily. 07/26/21  Yes Hochrein, Jeneen Rinks, MD  mometasone (ELOCON) 0.1 % cream Apply 1 application topically  daily. Patient taking differently: Apply 1 application  topically daily as needed (in ears). 02/10/20  Yes Hawks, Christy A, FNP  Multiple Vitamin (MULTIVITAMIN) capsule Take 1 capsule by mouth daily.   Yes [provider]  Omega-3 Fatty Acids (FISH OIL) 500 MG CAPS Take 1,000 mg by mouth daily.   Yes [provider]  pantoprazole (PROTONIX) 40 MG tablet Take 1 tablet (40 mg total) by mouth daily. 05/27/21  Yes Hawks, Christy A, FNP  Polyvinyl Alcohol-Povidone PF (REFRESH) 1.4-0.6 % SOLN Place 2 drops into both eyes every morning.   Yes [provider]  tacrolimus (PROTOPIC) 0.1 % ointment Apply topically in the morning and at bedtime. Patient taking differently: Apply 1 Application topically in the morning and at bedtime. Eye lids 03/12/20  Yes Lavonna Monarch, MD  furosemide (LASIX) 20 MG tablet Take 1 tablet (20 mg total) by mouth daily as needed. Patient taking differently: Take 20 mg by mouth daily as needed for fluid or edema. 05/27/21 09/13/21  Sharion Balloon, FNP    Current Outpatient Medications  Medication Sig Dispense Refill   acetaminophen (TYLENOL) 325 MG tablet Take 650 mg by mouth every 6 (six) hours as needed (Arthritis pain).     apixaban (ELIQUIS) 5 MG TABS tablet Take 1 tablet (5 mg total) by mouth 2 (two) times daily. 180 tablet 1   benazepril-hydrochlorthiazide (LOTENSIN HCT) 20-25 MG tablet Take 1 tablet by mouth daily. 90 tablet 1   digoxin (LANOXIN) 0.125 MG tablet Take 1 tablet  (0.125 mg total) by mouth daily. 90 tablet 3   levocetirizine (XYZAL) 5 MG tablet Take 1 tablet (5 mg total) by mouth every evening. 90 tablet 1   levothyroxine (SYNTHROID) 88 MCG tablet Take 1 tablet (88 mcg total) by mouth daily. 90 tablet 3   metoprolol tartrate (LOPRESSOR) 25 MG tablet Take 1.5 tablets (37.5 mg total) by mouth 2 (two) times daily. 270 tablet 3   mometasone (ELOCON) 0.1 % cream Apply 1 application topically daily. (Patient taking differently: Apply 1 application  topically daily as needed (in ears).) 45 g 2   Multiple Vitamin (MULTIVITAMIN) capsule Take 1 capsule by mouth daily.     Omega-3 Fatty Acids (FISH OIL) 500 MG CAPS Take 1,000 mg by mouth daily.     pantoprazole (PROTONIX) 40 MG tablet Take 1 tablet (40 mg total) by mouth daily. 90 tablet 1   Polyvinyl Alcohol-Povidone PF (REFRESH) 1.4-0.6 % SOLN Place 2 drops into both eyes every morning.     tacrolimus (PROTOPIC) 0.1 % ointment Apply topically in the morning and at bedtime. (Patient taking differently: Apply 1 Application topically in the morning and at bedtime. Eye lids) 30 g 4   furosemide (LASIX) 20 MG tablet Take 1 tablet (20 mg total) by mouth daily as needed. (Patient taking differently: Take 20 mg by mouth daily as needed for fluid or edema.) 90 tablet 1   No current facility-administered medications for this visit.    Allergies  Allergen Reactions   Noroxin [Norfloxacin] Nausea And Vomiting   Augmentin [Amoxicillin-Pot Clavulanate] Other (See Comments)    Gums swelling, lips numb      Review of Systems:   General:  normal appetite, + decreased energy, no weight gain, no weight loss, no fever  Cardiac:  no chest pain with exertion, no chest pain at rest, +SOB with moderate exertion, no resting SOB, no PND, no orthopnea, + palpitations, + arrhythmia, + atrial fibrillation, no LE edema, no dizzy spells, no  syncope  Respiratory:  + exertional shortness of breath, no home oxygen, no productive cough, no  dry cough, no bronchitis, no wheezing, no hemoptysis, no asthma, no pain with inspiration or cough, no sleep apnea, no CPAP at night  GI:   no difficulty swallowing, + reflux, no frequent heartburn, + hiatal hernia, no abdominal pain, no constipation, no diarrhea, no hematochezia, no hematemesis, no melena  GU:   no dysuria,  no frequency, no urinary tract infection, no hematuria,  no kidney stones, no kidney disease  Vascular:  no pain suggestive of claudication, no pain in feet, + leg cramps, no varicose veins, no DVT, no non-healing foot ulcer  Neuro:   no stroke, no TIA's, no seizures, no headaches, no temporary blindness one eye,  no slurred speech, no peripheral neuropathy, no chronic pain, no instability of gait, no memory/cognitive dysfunction  Musculoskeletal: + arthritis, no joint swelling, no myalgias, no difficulty walking, normal mobility   Skin:   no rash, no itching, no skin infections, no pressure sores or ulcerations  Psych:   no anxiety, no depression, no nervousness, no unusual recent stress  Eyes:   no blurry vision, + floaters, no recent vision changes, no glasses or contacts  ENT:   no hearing loss, no loose or painful teeth, no dentures, last saw dentist 09/27/21. Dental extractions planned 11/25/21  Hematologic:  no easy bruising, no abnormal bleeding, no clotting disorder, no frequent epistaxis  Endocrine:  no diabetes, does not check CBG's at home     Physical Exam:   BP (!) 153/89 (BP Location: Left Arm, Patient Position: Sitting)   Pulse 74   Resp 18   Ht '5\' 4"'$  (1.626 m)   SpO2 98% Comment: RA  BMI 27.98 kg/m   General:  Elderly,  well-appearing  HEENT:  Unremarkable, NCAT, PERLA, EOMI  Neck:   no JVD, no bruits, no adenopathy   Chest:   clear to auscultation, symmetrical breath sounds, no wheezes, no rhonchi   CV:   RRR, 3/6 systolic murmur RSB, no diastolic murmur  Abdomen:  soft, non-tender, no masses   Extremities:  warm, well-perfused, pulses palpable at  ankles, no lower extremity edema  Rectal/GU  Deferred  Neuro:   Grossly non-focal and symmetrical throughout  Skin:   Clean and dry, no rashes, no breakdown  Diagnostic Tests:  ECHOCARDIOGRAM REPORT         Patient Name:   DARRIEN LAAKSO Date of Exam: 05/25/2021  Medical Rec #:  [id number]      Height:       64.0 in  Accession #:    [id number]     Weight:       166.0 lb  Date of Birth:  [birth year]       BSA:          1.807 m  Patient Age:    82 years       BP:           118/80 mmHg  Patient Gender: F              HR:           107 bpm.  Exam Location:  Outpatient   Procedure: 2D Echo, Cardiac Doppler and Color Doppler   Indications:    R06.9 DOE; R60.0 Lower extremity edema; I48.91*  Unspeicified                  atrial fibrillation; I35.2 Nonrheumatic aortic (valve)  stenosis  with insufficiency     History:        Patient has prior history of Echocardiogram examinations,  most                  recent 04/02/2020. Aortic Valve Disease, Arrythmias:Atrial                  Fibrillation, Signs/Symptoms:Dyspnea and Edema; Risk                  Factors:Hypertension and Non-Smoker. Patient denies chest  pain                  but is having increasing DOE with mild bilateral leg  edema.     Sonographer:    Salvadore Dom RVT, RDCS (AE), RDMS  Referring Phys: [place]      Sonographer Comments: Suboptimal parasternal window. Image acquisition  challenging due to patient body habitus.  IMPRESSIONS     1. In Afib with RVR during study. Left ventricular ejection fraction, by  estimation, is 55 to 60%. The left ventricle has normal function. The left  ventricle has no regional wall motion abnormalities. There is mild left  ventricular hypertrophy. Left  ventricular diastolic parameters are indeterminate.   2. Right ventricular systolic function is normal. The right ventricular  size is moderately enlarged. There is normal pulmonary artery systolic  pressure.  The estimated right ventricular systolic pressure is 21.3 mmHg.   3. Left atrial size was severely dilated.   4. Right atrial size was severely dilated.   5. The mitral valve is degenerative. Trivial mitral valve regurgitation.  Moderate mitral annular calcification.   6. Tricuspid valve regurgitation is mild to moderate.   7. The inferior vena cava is normal in size with greater than 50%  respiratory variability, suggesting right atrial pressure of 3 mmHg.   8. The aortic valve was not well visualized. There is severe  calcification of the aortic valve. Aortic valve regurgitation is mild to  moderate. Severe aortic valve stenosis. Vmax 4.0 m/s, MG 38 mmHg, AVA 0.5  cm^2, DI 0.25   Comparison(s): EF 60%, mild AI 218mec, AS mean gradient 26.8 mmHG, peak  47.865mg.   FINDINGS   Left Ventricle: Left ventricular ejection fraction, by estimation, is 55  to 60%. The left ventricle has normal function. The left ventricle has no  regional wall motion abnormalities. The left ventricular internal cavity  size was small. There is mild  left ventricular hypertrophy. Left ventricular diastolic parameters are  indeterminate.   Right Ventricle: The right ventricular size is moderately enlarged. Right  vetricular wall thickness was not well visualized. Right ventricular  systolic function is normal. There is normal pulmonary artery systolic  pressure. The tricuspid regurgitant  velocity is 2.75 m/s, and with an assumed right atrial pressure of 3 mmHg,  the estimated right ventricular systolic pressure is 3308.6mHg.   Left Atrium: Left atrial size was severely dilated.   Right Atrium: Right atrial size was severely dilated.   Pericardium: There is no evidence of pericardial effusion.   Mitral Valve: The mitral valve is degenerative in appearance. Moderate  mitral annular calcification. Trivial mitral valve regurgitation.   Tricuspid Valve: The tricuspid valve is normal in structure. Tricuspid   valve regurgitation is mild to moderate.   Aortic Valve: The aortic valve was not well visualized. There is severe  calcifcation of the aortic valve. Aortic valve regurgitation is mild to  moderate. Aortic regurgitation PHT measures  522 msec. Severe aortic  stenosis is present. Aortic valve mean  gradient measures 30.2 mmHg. Aortic valve peak gradient measures 49.2  mmHg. Aortic valve area, by VTI measures 0.50 cm.   Pulmonic Valve: The pulmonic valve was not well visualized. Pulmonic valve  regurgitation is not visualized.   Aorta: The aortic root and ascending aorta are structurally normal, with  no evidence of dilitation.   Venous: The inferior vena cava is normal in size with greater than 50%  respiratory variability, suggesting right atrial pressure of 3 mmHg.   IAS/Shunts: The interatrial septum was not well visualized.      LEFT VENTRICLE  PLAX 2D  LVOT diam:     1.50 cm   Diastology  LV SV:         34        LV e' medial:    7.07 cm/s  LV SV Index:   19        LV E/e' medial:  18.2  LVOT Area:     1.77 cm  LV e' lateral:   11.10 cm/s                           LV E/e' lateral: 11.6                              3D Volume EF:                           3D EF:        58 %                           LV EDV:       79 ml                           LV ESV:       33 ml                           LV SV:        46 ml   RIGHT VENTRICLE  RV S prime:     13.40 cm/s  TAPSE (M-mode): 2.0 cm   LEFT ATRIUM             Index        RIGHT ATRIUM           Index  LA diam:        4.70 cm 2.60 cm/m   RA Area:     26.10 cm  LA Vol (A2C):   93.2 ml 51.57 ml/m  RA Volume:   101.00 ml 55.88 ml/m  LA Vol (A4C):   91.4 ml 50.57 ml/m  LA Biplane Vol: 93.5 ml 51.73 ml/m   AORTIC VALVE                     PULMONIC VALVE  AV Area (Vmax):    0.49 cm      PV Vmax:       0.96 m/s  AV Area (Vmean):   0.46 cm      PV Peak grad:  3.7 mmHg  AV Area (VTI):     0.50 cm  AV Vmax:  350.80 cm/s  AV Vmean:          257.250 cm/s  AV VTI:            0.685 m  AV Peak Grad:      49.2 mmHg  AV Mean Grad:      30.2 mmHg  LVOT Vmax:         96.43 cm/s  LVOT Vmean:        67.100 cm/s  LVOT VTI:          0.194 m  LVOT/AV VTI ratio: 0.28  AI PHT:            522 msec  AR Vena Contracta: 0.31 cm     AORTA  Ao Root diam: 3.10 cm  Ao Asc diam:  3.60 cm  Ao Arch diam: 3.2 cm   MITRAL VALVE                TRICUSPID VALVE  MV Area (PHT): 5.54 cm     TR Peak grad:   30.2 mmHg  MV Decel Time: 137 msec     TR Vmax:        275.00 cm/s  MV E velocity: 129.00 cm/s                              SHUNTS                              Systemic VTI:  0.19 m                              Systemic Diam: 1.50 cm   Oswaldo Milian MD  Electronically signed by Oswaldo Milian MD  Signature Date/Time: 05/25/2021/10:06:38 PM         Final     Physicians  Panel Physicians Referring Physician Case Authorizing Physician  Early Osmond, MD (Primary)     Procedures  ABDOMINAL AORTOGRAM  RIGHT HEART CATH AND CORONARY/GRAFT ANGIOGRAPHY   Conclusion      Prox LAD lesion is 20% stenosed.   1.  Mild obstructive coronary artery disease 2.  Cardiac output of 9.8 L/min and cardiac index of 5.4 L/min/m with mean RA pressure of 8 mmHg, mean wedge pressure of 16 mmHg, and mean PA pressure of 26 mmHg. 3.  Capacious iliofemoral vessels bilaterally with femoral bifurcations well below the femoral heads.   Recommendation: Continue evaluation for aortic valve intervention.   Indications  Nonrheumatic aortic valve stenosis [I35.0 (ICD-10-CM)]   Clinical Presentation  CHF/Shock Congestive heart failure not present. No shock present.   Procedural Details  Technical Details The patient is a 81 year old female with a history of severe symptomatic aortic stenosis, paroxysmal atrial fibrillation on anticoagulation, hypertension, hypothyroidism, and chronic kidney disease was evaluated  in the outpatient setting due to ongoing dyspnea.  She is referred for preprocedural assessment prior to aortic valve intervention.  After obtaining consent the patient was brought to the cardiac catheterization laboratory prepped draped sterile fashion ultrasound was used to gain access to the right radial artery and a 6 French glide sheath placed there.  '5mg'$  of verapamil and 5000 units of heparin were administered through the sheath.  A previously placed antecubital IV was exchanged for a 5 French femoral glide sheath.  Right heart catheterization, coronary angiography, and aortography study was then  performed.  At the conclusion of the procedure manual pressure was applied to the antecubital site and a TR band was placed on the radial site.   Estimated blood loss <50 mL.   During this procedure medications were administered to achieve and maintain moderate conscious sedation while the patient's heart rate, blood pressure, and oxygen saturation were continuously monitored and I was present face-to-face 100% of this time.   Medications (Filter: Administrations occurring from 1049 to 1158 on 09/22/21)  important  Continuous medications are totaled by the amount administered until 09/22/21 1158.   Heparin (Porcine) in NaCl 1000-0.9 UT/500ML-% SOLN (mL) Total volume:  1,000 mL  Date/Time Rate/Dose/Volume Action   09/22/21 1052 500 mL Given   1053 500 mL Given    fentaNYL (SUBLIMAZE) injection (mcg) Total dose:  25 mcg  Date/Time Rate/Dose/Volume Action   09/22/21 1103 25 mcg Given    midazolam (VERSED) injection (mg) Total dose:  1 mg  Date/Time Rate/Dose/Volume Action   09/22/21 1103 1 mg Given    lidocaine (PF) (XYLOCAINE) 1 % injection (mL) Total volume:  6 mL  Date/Time Rate/Dose/Volume Action   09/22/21 1106 6 mL Given    Radial Cocktail/Verapamil only (mL) Total volume:  10 mL  Date/Time Rate/Dose/Volume Action   09/22/21 1109 10 mL Given    heparin sodium (porcine)  injection (Units) Total dose:  5,000 Units  Date/Time Rate/Dose/Volume Action   09/22/21 1109 5,000 Units Given    iohexol (OMNIPAQUE) 350 MG/ML injection (mL) Total volume:  90 mL  Date/Time Rate/Dose/Volume Action   09/22/21 1146 90 mL Given    Sedation Time  Sedation Time Physician-1: 40 minutes 3 seconds Contrast  Medication Name Total Dose  iohexol (OMNIPAQUE) 350 MG/ML injection 90 mL   Radiation/Fluoro  Fluoro time: 10 (min) DAP: 11660 (mGycm2) Cumulative Air Kerma: 742 (mGy) Complications  Complications documented before study signed (09/22/2021 59:56 AM)   No complications were associated with this study.  Documented by Bard Herbert, RN - 09/22/2021 11:48 AM     Wound Indication  Wound Assessment No wound present.   Coronary Findings  Diagnostic Dominance: Right Left Anterior Descending  Prox LAD lesion is 20% stenosed.    Intervention   No interventions have been documented.   Peripheral Vascular Findings  Diagnostic  No diagnostic findings have been documented. Intervention  No interventions have been documented. Coronary Diagrams  Diagnostic Dominance: Right  Intervention  Peripheral Vascular Diagram  Diagnostic   Intervention  Implants     No implant documentation for this case.   [employer] Images   Show images for CARDIAC CATHETERIZATION Images on Long Term Storage   Show images for [place], [place] to Procedure Log  Procedure Log    Hemo Data  Flowsheet Row Most Recent Value  Fick Cardiac Output 9.81 L/min  Fick Cardiac Output Index 5.47 (L/min)/BSA  RA A Wave 11 mmHg  RA V Wave 10 mmHg  RA Mean 8 mmHg  RV Systolic Pressure 37 mmHg  RV Diastolic Pressure 2 mmHg  RV EDP 8 mmHg  PA Systolic Pressure 40 mmHg  PA Diastolic Pressure 19 mmHg  PA Mean 26 mmHg  PW A Wave 17 mmHg  PW V Wave 21 mmHg  PW Mean 16 mmHg  AO Systolic Pressure 387 mmHg  AO Diastolic Pressure 59 mmHg  AO Mean 86 mmHg  QP/QS 1  TPVR  Index 4.75 HRUI  TSVR Index 15.73 HRUI  PVR SVR Ratio 0.13  TPVR/TSVR Ratio 0.3  ADDENDUM REPORT: 10/04/2021 05:57   CLINICAL DATA:  Severe Aortic Stenosis.   EXAM: Cardiac TAVR CT   TECHNIQUE: A non-contrast, gated CT scan was obtained with axial slices of 3 mm through the heart for aortic valve calcium scoring. A 110 kV retrospective, gated, contrast cardiac scan was obtained. Gantry rotation speed was 250 msecs and collimation was 0.6 mm. Nitroglycerin was not given. The 3D data set was reconstructed in 5% intervals of the 0-95% of the R-R cycle. Systolic and diastolic phases were analyzed on a dedicated workstation using MPR, MIP, and VRT modes. The patient received 100 cc of contrast.   FINDINGS: Image quality: Excellent.   Noise artifact is: Limited.   Valve Morphology: Tricuspid aortic valve with diffuse severe calcifications and severe thickening. There is severely restricted leaflet motion. 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   Aortic Valve Calcium score: 2205   Aortic annular dimension:   Phase assessed: 35%   Annular area: 413 mm2   Annular perimeter: 73.1 mm   Max diameter: 24.7 mm   Min diameter: 21.1 mm   Annular and subannular calcification: Moderate protruding calcification under the [place] that extends into the LVOT. Minimal calcification under the RCC.   Membranous septum length: 13.4 mm   Optimal coplanar projection: LAO 12 CAU 9   Coronary Artery Height above Annulus:   Left Main: 17.2 mm   Right Coronary: 17.6 mm   Sinus of Valsalva Measurements:   Non-coronary: 30 mm   Right-coronary: 29 mm   Left-coronary: 30 mm   Sinus of Valsalva Height:   Non-coronary: 25.4 mm   Right-coronary: 23.2 mm   Left-coronary: 17.2 mm   Sinotubular Junction: 27 mm   Ascending Thoracic Aorta: 33 mm   Coronary Arteries: Normal coronary origin. High take off of the LCA above the LCC noted. Right dominance. The study was  performed without use of NTG and is insufficient for plaque evaluation. Please refer to recent cardiac catheterization for coronary assessment.   Cardiac Morphology:   Right Atrium: Right atrial size is within normal limits.   Right Ventricle: The right ventricular cavity is within normal limits.   Left Atrium: Left atrial size is normal in size with no left atrial appendage filling defect.   Left Ventricle: The ventricular cavity size is within normal limits.   Pulmonary arteries: Normal in size without proximal filling defect.   Pulmonary veins: Normal pulmonary venous drainage.   Pericardium: Normal thickness with no significant effusion or calcium present.   Mitral Valve: The mitral valve is normal structure without significant calcification.   Extra-cardiac findings: See attached radiology report for non-cardiac structures.   IMPRESSION: 1. Annular measurements appropriate for 23 mm S3 TAVR (413 mm2). Sizing is in between a 26/29 Evolut valve.   2. Moderate protruding calcification under the [place] that extends into the LVOT. Minimal calcification under the RCC.   3. Sufficient coronary to annulus distance. High take off of the LCA above the LCC noted.   4. Optimal Fluoroscopic Angle for Delivery: LAO 12 CAU 9   5. Very large hiatal hernia with displacement of the thoracic aorta into the R chest. Tortuous course noted.   Lake Bells T. Audie Box, MD     Electronically Signed   By: Eleonore Chiquito M.D.   On: 10/04/2021 05:57   Narrative & Impression  CLINICAL DATA:  Aortic valve replacement preop evaluation   EXAM: CT CHEST, ABDOMEN AND PELVIS WITHOUT CONTRAST   TECHNIQUE: Multidetector CT imaging of  the chest, abdomen and pelvis was performed following the standard protocol without IV contrast.   RADIATION DOSE REDUCTION: This exam was performed according to the departmental dose-optimization program which includes automated exposure control, adjustment of the  mA and/or kV according to patient size and/or use of iterative reconstruction technique.   COMPARISON:  None Available.   FINDINGS: CTA CHEST FINDINGS   Cardiovascular: Mild cardiomegaly. No pericardial effusion. Normal caliber aorta with mild atherosclerotic disease. Aortic valve thickening and calcifications. No coronary artery calcifications. Standard three-vessel aortic arch with significant stenosis. No suspicious filling defects of the central pulmonary arteries.   Mediastinum/Nodes: Esophagus is unremarkable. No pathologically enlarged nodes seen in the chest.   Lungs/Pleura: Central airways are patent. No consolidation, pleural effusion or pneumothorax. Compressive atelectasis of the left lower lobe secondary to large hiatal hernia.   Musculoskeletal: No aggressive appearing osseous lesions. Dextrocurvature of the thoracic spine.   CTA ABDOMEN AND PELVIS FINDINGS   Hepatobiliary: No focal liver abnormality is seen. No gallstones, gallbladder wall thickening, or biliary dilatation.   Pancreas: Unremarkable. No pancreatic ductal dilatation or surrounding inflammatory changes.   Spleen: Normal in size without focal abnormality.   Adrenals/Urinary Tract: Bilateral adrenal glands are unremarkable. Kidneys enhance symmetrically with no evidence of hydronephrosis or nephrolithiasis. Right-greater-than-left cortical scarring. No suspicious renal lesions. Bladder is unremarkable.   Stomach/Bowel: Large hiatal hernia containing the stomach and a portion of the transverse colon. Stomach is within normal limits. Diverticulosis. No evidence of bowel wall thickening, distention, or inflammatory changes.   Vascular/lymphatic: No pathologically enlarged lymph nodes seen in the abdomen or pelvis. Normal caliber abdominal aorta with mild atherosclerotic disease. Severe narrowing at the origin of the SMA due to noncalcified plaque, otherwise no significant stenosis.    Reproductive: Uterus and bilateral adnexa are unremarkable.   Other: No abdominal wall hernia or abnormality. No abdominopelvic ascites.   Musculoskeletal: No aggressive appearing osseous lesions. Levocurvature of the lumbar spine. Degenerative disc disease of the lumbar spine.   VASCULAR MEASUREMENTS PERTINENT TO TAVR:   AORTA:   Minimal Aortic Diameter-13.8 mm   Severity of Aortic Calcification-mild   RIGHT PELVIS:   Right Common Iliac Artery -   Minimal Diameter-8.9 mm   Tortuosity-none   Calcification-mild   Right External Iliac Artery -   Minimal Diameter-7.4 mm   Tortuosity-none   Calcification-none   Right Common Femoral Artery -   Minimal Diameter-7.2 mm   Tortuosity-none   Calcification-none   LEFT PELVIS:   Left Common Iliac Artery -   Minimal Diameter-9.4 mm   Tortuosity-none   Calcification-mild   Left External Iliac Artery -   Minimal Diameter-7.4 mm   Tortuosity-moderate   Calcification-none   Left Common Femoral Artery -   Minimal Diameter-7.9 mm   Tortuosity-none   Calcification-none   Review of the MIP images confirms the above findings.   IMPRESSION: 1. Vascular findings and measurements pertinent to potential TAVR procedure, as detailed above. 2. Thickening and calcification of the aortic valve, compatible with reported clinical history of aortic stenosis. 3. Mild aortoiliac atherosclerosis. Severe narrowing at the origin of the SMA due to noncalcified plaque. 4. Large hiatal hernia containing the stomach and a portion of the transverse colon. Associated compressive atelectasis of the left lower lobe.     Electronically Signed   By: Yetta Glassman M.D.   On: 10/01/2021 12:44        Impression:  This 81 year old woman has stage D1, severe, symptomatic aortic stenosis with NYHA class  II symptoms of exertional fatigue and shortness of breath consistent with chronic diastolic congestive heart failure.  I  have personally reviewed her 2D echocardiogram, cardiac catheterization, and CTA studies.  Her echocardiogram shows a severely calcified and thickened aortic valve with restricted leaflet mobility.  The mean gradient was 38 mmHg with a peak gradient of 49 mmHg with a valve area of 0.5 cm with a dimensionless index of 0.25.  Left ventricular ejection fraction was normal.  Cardiac catheterization showed mild nonobstructive coronary disease.  Given her age I think that transcatheter aortic valve replacement would be the best treatment for improvement of her symptoms and to prevent left ventricular dysfunction.  Her gated cardiac CTA shows anatomy suitable for TAVR using a SAPIEN 3 valve.  She does have a tortuous aorta due to scoliosis.  Her abdominal and pelvic CTA shows adequate pelvic vascular anatomy to allow transfemoral insertion.  CT scan also shows a large hiatal hernia into the left chest containing the stomach and a portion of the transverse colon.  She denies any history of nausea, vomiting, or dysphagia but said that she has to watch how much she eats at one time.  This has been present on chest x-ray dating back to 2018 but looks like it may be getting larger over time.  It could certainly cause some shortness of breath if the hernia continues to increase with compression of her left lung.  It could be fixed surgically if needed.  The patient was counseled at length regarding treatment alternatives for management of severe symptomatic aortic stenosis. The risks and benefits of surgical intervention has been discussed in detail. Long-term prognosis with medical therapy was discussed. Alternative approaches such as conventional surgical aortic valve replacement, transcatheter aortic valve replacement, and palliative medical therapy were compared and contrasted at length. This discussion was placed in the context of the patient's own specific clinical presentation and past medical history. All of her  questions have been addressed.   Following the decision to proceed with transcatheter aortic valve replacement, a discussion was held regarding what types of management strategies would be attempted intraoperatively in the event of life-threatening complications, including whether or not the patient would be considered a candidate for the use of cardiopulmonary bypass and/or conversion to open sternotomy for attempted surgical intervention.  I think she would be a candidate for emergent  sternotomy to manage any intraoperative complications.  The patient is aware of the fact that transient use of cardiopulmonary bypass may be necessary. The patient has been advised of a variety of complications that might develop including but not limited to risks of death, stroke, paravalvular leak, aortic dissection or other major vascular complications, aortic annulus rupture, device embolization, cardiac rupture or perforation, mitral regurgitation, acute myocardial infarction, arrhythmia, heart block or bradycardia requiring permanent pacemaker placement, congestive heart failure, respiratory failure, renal failure, pneumonia, infection, other late complications related to structural valve deterioration or migration, or other complications that might ultimately cause a temporary or permanent loss of functional independence or other long term morbidity. The patient provides full informed consent for the procedure as described and all questions were answered.      Plan:  She will be scheduled for transfemoral TAVR using a SAPIEN 3 valve in the next few weeks.  I spent 60 minutes performing this consultation and > 50% of this time was spent face to face counseling and coordinating the care of this patient's severe symptomatic aortic stenosis.   Gaye Pollack, MD 11/17/2021

## 2021-11-17 NOTE — Progress Notes (Unsigned)
Pre Surgical Assessment: 5 M Walk Test  30M=16.74f  5 Meter Walk Test- trial 1: 5.80 seconds 5 Meter Walk Test- trial 2: 7.74 seconds 5 Meter Walk Test- trial 3: 6.35 seconds 5 Meter Walk Test Average: 6.63 seconds

## 2021-11-17 NOTE — Progress Notes (Signed)
Patient ID: Autumn Moyer, female   DOB: 1940-12-04, 81 y.o.   MRN: [id number]  [facility]       [address]       [place] [address]             [phone]          CARDIOTHORACIC SURGERY CONSULTATION REPORT  PCP is Sharion Balloon, FNP Referring Provider is Lenna Sciara, MD Primary Cardiologist is Minus Breeding, MD  Reason for consultation:  Severe aortic stenosis  HPI:  The patient is a 81 year old woman with a history of hypertension, paroxysmal atrial fibrillation on anticoagulation, hypothyroidism, and severe aortic stenosis who was referred for consideration of transcatheter aortic valve replacement.  She has been followed with periodic echocardiograms by Dr. Percival Spanish since 2016.  Her mean aortic valve gradient was 27 mmHg in January 2022.  The mean gradient had increased to 30 mmHg on her most recent echo in March of this year.  The peak gradient was 49 mmHg with a valve area by VTI of 0.5 cm.  There was mild to moderate aortic regurgitation.  Left ventricular ejection fraction was 55 to 60%.  There was trivial mitral regurgitation.  She has developed shortness of breath and fatigue with exertion.  She reports some shortness of breath for several years but has gotten worse over the past 6 months.  She said that digoxin was added to her medication regimen for atrial fibrillation and has made her feel better.  She has had shortness of breath and fatigue with doing her daily activities and walking upstairs.  She denies any dizziness or syncope.  She has had no chest discomfort.  She denies peripheral edema and orthopnea.  She had not seen a dentist in years and was seen by Dr. Benson Norway and scheduled for dental extractions on 11/25/2021.  She is married and lives with her husband in [place].  She has 2 adult children.  Past Medical History:  Diagnosis Date   Atrial fibrillation ([place])     Hypertension    Squamous cell carcinoma of skin 01/16/2018   in situ-right knee post (txpbx)   Thyroid disease     Past Surgical History:  Procedure Laterality Date   ABDOMINAL AORTOGRAM N/A 09/22/2021   Procedure: ABDOMINAL AORTOGRAM;  Surgeon: Early Osmond, MD;  Location: [facility];  Service: Cardiovascular;  Laterality: N/A;   EYE SURGERY     RIGHT HEART CATH AND CORONARY/GRAFT ANGIOGRAPHY N/A 09/22/2021   Procedure: RIGHT HEART CATH AND CORONARY/GRAFT ANGIOGRAPHY;  Surgeon: Early Osmond, MD;  Location: [facility];  Service: Cardiovascular;  Laterality: N/A;   TOE SURGERY     TUBAL LIGATION      Family History  Problem Relation Age of Onset   Hypertension Mother    Hyperlipidemia Mother    Heart failure Mother        Age 88   Hyperlipidemia Father    Hypertension Father    Heart disease Father        died of MI after developoing cancer.   Lung cancer Father    Hypertension Sister    Breast cancer Daughter    Breast cancer Paternal Aunt     Social History   Socioeconomic History   Marital status: Married    Spouse name: Kasandra Knudsen   Number of children: 2   Years of education: Not on file  Highest education level: Not on file  Occupational History   Not on file  Tobacco Use   Smoking status: Never    Passive exposure: Past   Smokeless tobacco: Never  Vaping Use   Vaping Use: Never used  Substance and Sexual Activity   Alcohol use: No    Alcohol/week: 0.0 standard drinks of alcohol   Drug use: No   Sexual activity: Yes    Partners: Female  Other Topics Concern   Not on file  Social History Narrative   Lives with husband.  Married x 56 years in 2022.   2 daughters   5 grand children   1 great grandchild.   Social Determinants of Health   Financial Resource Strain: Low Risk  (02/03/2021)   Overall Financial Resource Strain (CARDIA)    Difficulty of Paying Living Expenses: Not hard at all  Food Insecurity: No Food Insecurity  (02/03/2021)   Hunger Vital Sign    Worried About Running Out of Food in the Last Year: Never true    Ran Out of Food in the Last Year: Never true  Transportation Needs: No Transportation Needs (02/03/2021)   PRAPARE - Hydrologist (Medical): No    Lack of Transportation (Non-Medical): No  Physical Activity: Insufficiently Active (02/03/2021)   Exercise Vital Sign    Days of Exercise per Week: 3 days    Minutes of Exercise per Session: 20 min  Stress: No Stress Concern Present (02/03/2021)   [place]    Feeling of Stress : Not at all  Social Connections: [place] (02/03/2021)   Social Connection and Isolation Panel [NHANES]    Frequency of Communication with Friends and Family: More than three times a week    Frequency of Social Gatherings with Friends and Family: More than three times a week    Attends Religious Services: More than 4 times per year    Active Member of [employer] or Organizations: Yes    Attends Archivist Meetings: More than 4 times per year    Marital Status: Married  Human resources officer Violence: Not At Risk (02/03/2021)   Humiliation, Afraid, Rape, and Kick questionnaire    Fear of Current or Ex-Partner: No    Emotionally Abused: No    Physically Abused: No    Sexually Abused: No    Prior to Admission medications   Medication Sig Start Date End Date Taking? Authorizing Provider  acetaminophen (TYLENOL) 325 MG tablet Take 650 mg by mouth every 6 (six) hours as needed (Arthritis pain).   Yes [provider]  apixaban (ELIQUIS) 5 MG TABS tablet Take 1 tablet (5 mg total) by mouth 2 (two) times daily. 05/27/21  Yes Hawks, Christy A, FNP  benazepril-hydrochlorthiazide (LOTENSIN HCT) 20-25 MG tablet Take 1 tablet by mouth daily. 05/27/21  Yes Hawks, Christy A, FNP  digoxin (LANOXIN) 0.125 MG tablet Take 1 tablet (0.125 mg total) by mouth daily. 05/31/21   Yes Minus Breeding, MD  levocetirizine (XYZAL) 5 MG tablet Take 1 tablet (5 mg total) by mouth every evening. 05/27/21  Yes Hawks, Christy A, FNP  levothyroxine (SYNTHROID) 88 MCG tablet Take 1 tablet (88 mcg total) by mouth daily. 05/27/21 05/27/22 Yes Hawks, Christy A, FNP  metoprolol tartrate (LOPRESSOR) 25 MG tablet Take 1.5 tablets (37.5 mg total) by mouth 2 (two) times daily. 07/26/21  Yes Hochrein, Jeneen Rinks, MD  mometasone (ELOCON) 0.1 % cream Apply 1 application topically  daily. Patient taking differently: Apply 1 application  topically daily as needed (in ears). 02/10/20  Yes Hawks, Christy A, FNP  Multiple Vitamin (MULTIVITAMIN) capsule Take 1 capsule by mouth daily.   Yes [provider]  Omega-3 Fatty Acids (FISH OIL) 500 MG CAPS Take 1,000 mg by mouth daily.   Yes [provider]  pantoprazole (PROTONIX) 40 MG tablet Take 1 tablet (40 mg total) by mouth daily. 05/27/21  Yes Hawks, Christy A, FNP  Polyvinyl Alcohol-Povidone PF (REFRESH) 1.4-0.6 % SOLN Place 2 drops into both eyes every morning.   Yes [provider]  tacrolimus (PROTOPIC) 0.1 % ointment Apply topically in the morning and at bedtime. Patient taking differently: Apply 1 Application topically in the morning and at bedtime. Eye lids 03/12/20  Yes Lavonna Monarch, MD  furosemide (LASIX) 20 MG tablet Take 1 tablet (20 mg total) by mouth daily as needed. Patient taking differently: Take 20 mg by mouth daily as needed for fluid or edema. 05/27/21 09/13/21  Sharion Balloon, FNP    Current Outpatient Medications  Medication Sig Dispense Refill   acetaminophen (TYLENOL) 325 MG tablet Take 650 mg by mouth every 6 (six) hours as needed (Arthritis pain).     apixaban (ELIQUIS) 5 MG TABS tablet Take 1 tablet (5 mg total) by mouth 2 (two) times daily. 180 tablet 1   benazepril-hydrochlorthiazide (LOTENSIN HCT) 20-25 MG tablet Take 1 tablet by mouth daily. 90 tablet 1   digoxin (LANOXIN) 0.125 MG tablet Take 1 tablet  (0.125 mg total) by mouth daily. 90 tablet 3   levocetirizine (XYZAL) 5 MG tablet Take 1 tablet (5 mg total) by mouth every evening. 90 tablet 1   levothyroxine (SYNTHROID) 88 MCG tablet Take 1 tablet (88 mcg total) by mouth daily. 90 tablet 3   metoprolol tartrate (LOPRESSOR) 25 MG tablet Take 1.5 tablets (37.5 mg total) by mouth 2 (two) times daily. 270 tablet 3   mometasone (ELOCON) 0.1 % cream Apply 1 application topically daily. (Patient taking differently: Apply 1 application  topically daily as needed (in ears).) 45 g 2   Multiple Vitamin (MULTIVITAMIN) capsule Take 1 capsule by mouth daily.     Omega-3 Fatty Acids (FISH OIL) 500 MG CAPS Take 1,000 mg by mouth daily.     pantoprazole (PROTONIX) 40 MG tablet Take 1 tablet (40 mg total) by mouth daily. 90 tablet 1   Polyvinyl Alcohol-Povidone PF (REFRESH) 1.4-0.6 % SOLN Place 2 drops into both eyes every morning.     tacrolimus (PROTOPIC) 0.1 % ointment Apply topically in the morning and at bedtime. (Patient taking differently: Apply 1 Application topically in the morning and at bedtime. Eye lids) 30 g 4   furosemide (LASIX) 20 MG tablet Take 1 tablet (20 mg total) by mouth daily as needed. (Patient taking differently: Take 20 mg by mouth daily as needed for fluid or edema.) 90 tablet 1   No current facility-administered medications for this visit.    Allergies  Allergen Reactions   Noroxin [Norfloxacin] Nausea And Vomiting   Augmentin [Amoxicillin-Pot Clavulanate] Other (See Comments)    Gums swelling, lips numb      Review of Systems:   General:  normal appetite, + decreased energy, no weight gain, no weight loss, no fever  Cardiac:  no chest pain with exertion, no chest pain at rest, +SOB with moderate exertion, no resting SOB, no PND, no orthopnea, + palpitations, + arrhythmia, + atrial fibrillation, no LE edema, no dizzy spells, no  syncope  Respiratory:  + exertional shortness of breath, no home oxygen, no productive cough, no  dry cough, no bronchitis, no wheezing, no hemoptysis, no asthma, no pain with inspiration or cough, no sleep apnea, no CPAP at night  GI:   no difficulty swallowing, + reflux, no frequent heartburn, + hiatal hernia, no abdominal pain, no constipation, no diarrhea, no hematochezia, no hematemesis, no melena  GU:   no dysuria,  no frequency, no urinary tract infection, no hematuria,  no kidney stones, no kidney disease  Vascular:  no pain suggestive of claudication, no pain in feet, + leg cramps, no varicose veins, no DVT, no non-healing foot ulcer  Neuro:   no stroke, no TIA's, no seizures, no headaches, no temporary blindness one eye,  no slurred speech, no peripheral neuropathy, no chronic pain, no instability of gait, no memory/cognitive dysfunction  Musculoskeletal: + arthritis, no joint swelling, no myalgias, no difficulty walking, normal mobility   Skin:   no rash, no itching, no skin infections, no pressure sores or ulcerations  Psych:   no anxiety, no depression, no nervousness, no unusual recent stress  Eyes:   no blurry vision, + floaters, no recent vision changes, no glasses or contacts  ENT:   no hearing loss, no loose or painful teeth, no dentures, last saw dentist 09/27/21. Dental extractions planned 11/25/21  Hematologic:  no easy bruising, no abnormal bleeding, no clotting disorder, no frequent epistaxis  Endocrine:  no diabetes, does not check CBG's at home     Physical Exam:   BP (!) 153/89 (BP Location: Left Arm, Patient Position: Sitting)   Pulse 74   Resp 18   Ht '5\' 4"'$  (1.626 m)   SpO2 98% Comment: RA  BMI 27.98 kg/m   General:  Elderly,  well-appearing  HEENT:  Unremarkable, NCAT, PERLA, EOMI  Neck:   no JVD, no bruits, no adenopathy   Chest:   clear to auscultation, symmetrical breath sounds, no wheezes, no rhonchi   CV:   RRR, 3/6 systolic murmur RSB, no diastolic murmur  Abdomen:  soft, non-tender, no masses   Extremities:  warm, well-perfused, pulses palpable at  ankles, no lower extremity edema  Rectal/GU  Deferred  Neuro:   Grossly non-focal and symmetrical throughout  Skin:   Clean and dry, no rashes, no breakdown  Diagnostic Tests:  ECHOCARDIOGRAM REPORT         Patient Name:   IDA MILBRATH Date of Exam: 05/25/2021  Medical Rec #:  [id number]      Height:       64.0 in  Accession #:    [id number]     Weight:       166.0 lb  Date of Birth:  1940-10-07       BSA:          1.807 m  Patient Age:    3 years       BP:           118/80 mmHg  Patient Gender: F              HR:           107 bpm.  Exam Location:  Outpatient   Procedure: 2D Echo, Cardiac Doppler and Color Doppler   Indications:    R06.9 DOE; R60.0 Lower extremity edema; I48.91*  Unspeicified                  atrial fibrillation; I35.2 Nonrheumatic aortic (valve)  stenosis  with insufficiency     History:        Patient has prior history of Echocardiogram examinations,  most                  recent 04/02/2020. Aortic Valve Disease, Arrythmias:Atrial                  Fibrillation, Signs/Symptoms:Dyspnea and Edema; Risk                  Factors:Hypertension and Non-Smoker. Patient denies chest  pain                  but is having increasing DOE with mild bilateral leg  edema.     Sonographer:    Salvadore Dom RVT, RDCS (AE), RDMS  Referring Phys: [place]      Sonographer Comments: Suboptimal parasternal window. Image acquisition  challenging due to patient body habitus.  IMPRESSIONS     1. In Afib with RVR during study. Left ventricular ejection fraction, by  estimation, is 55 to 60%. The left ventricle has normal function. The left  ventricle has no regional wall motion abnormalities. There is mild left  ventricular hypertrophy. Left  ventricular diastolic parameters are indeterminate.   2. Right ventricular systolic function is normal. The right ventricular  size is moderately enlarged. There is normal pulmonary artery systolic  pressure.  The estimated right ventricular systolic pressure is 00.1 mmHg.   3. Left atrial size was severely dilated.   4. Right atrial size was severely dilated.   5. The mitral valve is degenerative. Trivial mitral valve regurgitation.  Moderate mitral annular calcification.   6. Tricuspid valve regurgitation is mild to moderate.   7. The inferior vena cava is normal in size with greater than 50%  respiratory variability, suggesting right atrial pressure of 3 mmHg.   8. The aortic valve was not well visualized. There is severe  calcification of the aortic valve. Aortic valve regurgitation is mild to  moderate. Severe aortic valve stenosis. Vmax 4.0 m/s, MG 38 mmHg, AVA 0.5  cm^2, DI 0.25   Comparison(s): EF 60%, mild AI 271mec, AS mean gradient 26.8 mmHG, peak  47.854mg.   FINDINGS   Left Ventricle: Left ventricular ejection fraction, by estimation, is 55  to 60%. The left ventricle has normal function. The left ventricle has no  regional wall motion abnormalities. The left ventricular internal cavity  size was small. There is mild  left ventricular hypertrophy. Left ventricular diastolic parameters are  indeterminate.   Right Ventricle: The right ventricular size is moderately enlarged. Right  vetricular wall thickness was not well visualized. Right ventricular  systolic function is normal. There is normal pulmonary artery systolic  pressure. The tricuspid regurgitant  velocity is 2.75 m/s, and with an assumed right atrial pressure of 3 mmHg,  the estimated right ventricular systolic pressure is 3374.9mHg.   Left Atrium: Left atrial size was severely dilated.   Right Atrium: Right atrial size was severely dilated.   Pericardium: There is no evidence of pericardial effusion.   Mitral Valve: The mitral valve is degenerative in appearance. Moderate  mitral annular calcification. Trivial mitral valve regurgitation.   Tricuspid Valve: The tricuspid valve is normal in structure. Tricuspid   valve regurgitation is mild to moderate.   Aortic Valve: The aortic valve was not well visualized. There is severe  calcifcation of the aortic valve. Aortic valve regurgitation is mild to  moderate. Aortic regurgitation PHT measures  522 msec. Severe aortic  stenosis is present. Aortic valve mean  gradient measures 30.2 mmHg. Aortic valve peak gradient measures 49.2  mmHg. Aortic valve area, by VTI measures 0.50 cm.   Pulmonic Valve: The pulmonic valve was not well visualized. Pulmonic valve  regurgitation is not visualized.   Aorta: The aortic root and ascending aorta are structurally normal, with  no evidence of dilitation.   Venous: The inferior vena cava is normal in size with greater than 50%  respiratory variability, suggesting right atrial pressure of 3 mmHg.   IAS/Shunts: The interatrial septum was not well visualized.      LEFT VENTRICLE  PLAX 2D  LVOT diam:     1.50 cm   Diastology  LV SV:         34        LV e' medial:    7.07 cm/s  LV SV Index:   19        LV E/e' medial:  18.2  LVOT Area:     1.77 cm  LV e' lateral:   11.10 cm/s                           LV E/e' lateral: 11.6                              3D Volume EF:                           3D EF:        58 %                           LV EDV:       79 ml                           LV ESV:       33 ml                           LV SV:        46 ml   RIGHT VENTRICLE  RV S prime:     13.40 cm/s  TAPSE (M-mode): 2.0 cm   LEFT ATRIUM             Index        RIGHT ATRIUM           Index  LA diam:        4.70 cm 2.60 cm/m   RA Area:     26.10 cm  LA Vol (A2C):   93.2 ml 51.57 ml/m  RA Volume:   101.00 ml 55.88 ml/m  LA Vol (A4C):   91.4 ml 50.57 ml/m  LA Biplane Vol: 93.5 ml 51.73 ml/m   AORTIC VALVE                     PULMONIC VALVE  AV Area (Vmax):    0.49 cm      PV Vmax:       0.96 m/s  AV Area (Vmean):   0.46 cm      PV Peak grad:  3.7 mmHg  AV Area (VTI):     0.50 cm  AV Vmax:  350.80 cm/s  AV Vmean:          257.250 cm/s  AV VTI:            0.685 m  AV Peak Grad:      49.2 mmHg  AV Mean Grad:      30.2 mmHg  LVOT Vmax:         96.43 cm/s  LVOT Vmean:        67.100 cm/s  LVOT VTI:          0.194 m  LVOT/AV VTI ratio: 0.28  AI PHT:            522 msec  AR Vena Contracta: 0.31 cm     AORTA  Ao Root diam: 3.10 cm  Ao Asc diam:  3.60 cm  Ao Arch diam: 3.2 cm   MITRAL VALVE                TRICUSPID VALVE  MV Area (PHT): 5.54 cm     TR Peak grad:   30.2 mmHg  MV Decel Time: 137 msec     TR Vmax:        275.00 cm/s  MV E velocity: 129.00 cm/s                              SHUNTS                              Systemic VTI:  0.19 m                              Systemic Diam: 1.50 cm   Oswaldo Milian MD  Electronically signed by Oswaldo Milian MD  Signature Date/Time: 05/25/2021/10:06:38 PM         Final     Physicians  Panel Physicians Referring Physician Case Authorizing Physician  Early Osmond, MD (Primary)     Procedures  ABDOMINAL AORTOGRAM  RIGHT HEART CATH AND CORONARY/GRAFT ANGIOGRAPHY   Conclusion      Prox LAD lesion is 20% stenosed.   1.  Mild obstructive coronary artery disease 2.  Cardiac output of 9.8 L/min and cardiac index of 5.4 L/min/m with mean RA pressure of 8 mmHg, mean wedge pressure of 16 mmHg, and mean PA pressure of 26 mmHg. 3.  Capacious iliofemoral vessels bilaterally with femoral bifurcations well below the femoral heads.   Recommendation: Continue evaluation for aortic valve intervention.   Indications  Nonrheumatic aortic valve stenosis [I35.0 (ICD-10-CM)]   Clinical Presentation  CHF/Shock Congestive heart failure not present. No shock present.   Procedural Details  Technical Details The patient is a 81 year old female with a history of severe symptomatic aortic stenosis, paroxysmal atrial fibrillation on anticoagulation, hypertension, hypothyroidism, and chronic kidney disease was evaluated  in the outpatient setting due to ongoing dyspnea.  She is referred for preprocedural assessment prior to aortic valve intervention.  After obtaining consent the patient was brought to the cardiac catheterization laboratory prepped draped sterile fashion ultrasound was used to gain access to the right radial artery and a 6 French glide sheath placed there.  '5mg'$  of verapamil and 5000 units of heparin were administered through the sheath.  A previously placed antecubital IV was exchanged for a 5 French femoral glide sheath.  Right heart catheterization, coronary angiography, and aortography study was then  performed.  At the conclusion of the procedure manual pressure was applied to the antecubital site and a TR band was placed on the radial site.   Estimated blood loss <50 mL.   During this procedure medications were administered to achieve and maintain moderate conscious sedation while the patient's heart rate, blood pressure, and oxygen saturation were continuously monitored and I was present face-to-face 100% of this time.   Medications (Filter: Administrations occurring from 1049 to 1158 on 09/22/21)  important  Continuous medications are totaled by the amount administered until 09/22/21 1158.   Heparin (Porcine) in NaCl 1000-0.9 UT/500ML-% SOLN (mL) Total volume:  1,000 mL  Date/Time Rate/Dose/Volume Action   09/22/21 1052 500 mL Given   1053 500 mL Given    fentaNYL (SUBLIMAZE) injection (mcg) Total dose:  25 mcg  Date/Time Rate/Dose/Volume Action   09/22/21 1103 25 mcg Given    midazolam (VERSED) injection (mg) Total dose:  1 mg  Date/Time Rate/Dose/Volume Action   09/22/21 1103 1 mg Given    lidocaine (PF) (XYLOCAINE) 1 % injection (mL) Total volume:  6 mL  Date/Time Rate/Dose/Volume Action   09/22/21 1106 6 mL Given    Radial Cocktail/Verapamil only (mL) Total volume:  10 mL  Date/Time Rate/Dose/Volume Action   09/22/21 1109 10 mL Given    heparin sodium (porcine)  injection (Units) Total dose:  5,000 Units  Date/Time Rate/Dose/Volume Action   09/22/21 1109 5,000 Units Given    iohexol (OMNIPAQUE) 350 MG/ML injection (mL) Total volume:  90 mL  Date/Time Rate/Dose/Volume Action   09/22/21 1146 90 mL Given    Sedation Time  Sedation Time Physician-1: 40 minutes 3 seconds Contrast  Medication Name Total Dose  iohexol (OMNIPAQUE) 350 MG/ML injection 90 mL   Radiation/Fluoro  Fluoro time: 10 (min) DAP: 11660 (mGycm2) Cumulative Air Kerma: 671 (mGy) Complications  Complications documented before study signed (09/22/2021 24:58 AM)   No complications were associated with this study.  Documented by Bard Herbert, RN - 09/22/2021 11:48 AM     Wound Indication  Wound Assessment No wound present.   Coronary Findings  Diagnostic Dominance: Right Left Anterior Descending  Prox LAD lesion is 20% stenosed.    Intervention   No interventions have been documented.   Peripheral Vascular Findings  Diagnostic  No diagnostic findings have been documented. Intervention  No interventions have been documented. Coronary Diagrams  Diagnostic Dominance: Right  Intervention  Peripheral Vascular Diagram  Diagnostic   Intervention  Implants     No implant documentation for this case.   Syng[employer] Images   Show images for CARDIAC CATHETERIZATION Images on Long Term Storage   Show images for Marleigh, [place] to Procedure Log  Procedure Log    Hemo Data  Flowsheet Row Most Recent Value  Fick Cardiac Output 9.81 L/min  Fick Cardiac Output Index 5.47 (L/min)/BSA  RA A Wave 11 mmHg  RA V Wave 10 mmHg  RA Mean 8 mmHg  RV Systolic Pressure 37 mmHg  RV Diastolic Pressure 2 mmHg  RV EDP 8 mmHg  PA Systolic Pressure 40 mmHg  PA Diastolic Pressure 19 mmHg  PA Mean 26 mmHg  PW A Wave 17 mmHg  PW V Wave 21 mmHg  PW Mean 16 mmHg  AO Systolic Pressure 099 mmHg  AO Diastolic Pressure 59 mmHg  AO Mean 86 mmHg  QP/QS 1  TPVR  Index 4.75 HRUI  TSVR Index 15.73 HRUI  PVR SVR Ratio 0.13  TPVR/TSVR Ratio 0.3  ADDENDUM REPORT: 10/04/2021 05:57   CLINICAL DATA:  Severe Aortic Stenosis.   EXAM: Cardiac TAVR CT   TECHNIQUE: A non-contrast, gated CT scan was obtained with axial slices of 3 mm through the heart for aortic valve calcium scoring. A 110 kV retrospective, gated, contrast cardiac scan was obtained. Gantry rotation speed was 250 msecs and collimation was 0.6 mm. Nitroglycerin was not given. The 3D data set was reconstructed in 5% intervals of the 0-95% of the R-R cycle. Systolic and diastolic phases were analyzed on a dedicated workstation using MPR, MIP, and VRT modes. The patient received 100 cc of contrast.   FINDINGS: Image quality: Excellent.   Noise artifact is: Limited.   Valve Morphology: Tricuspid aortic valve with diffuse severe calcifications and severe thickening. There is severely restricted leaflet motion. 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   Aortic Valve Calcium score: 2205   Aortic annular dimension:   Phase assessed: 35%   Annular area: 413 mm2   Annular perimeter: 73.1 mm   Max diameter: 24.7 mm   Min diameter: 21.1 mm   Annular and subannular calcification: Moderate protruding calcification under the [place] that extends into the LVOT. Minimal calcification under the RCC.   Membranous septum length: 13.4 mm   Optimal coplanar projection: LAO 12 CAU 9   Coronary Artery Height above Annulus:   Left Main: 17.2 mm   Right Coronary: 17.6 mm   Sinus of Valsalva Measurements:   Non-coronary: 30 mm   Right-coronary: 29 mm   Left-coronary: 30 mm   Sinus of Valsalva Height:   Non-coronary: 25.4 mm   Right-coronary: 23.2 mm   Left-coronary: 17.2 mm   Sinotubular Junction: 27 mm   Ascending Thoracic Aorta: 33 mm   Coronary Arteries: Normal coronary origin. High take off of the LCA above the LCC noted. Right dominance. The study was  performed without use of NTG and is insufficient for plaque evaluation. Please refer to recent cardiac catheterization for coronary assessment.   Cardiac Morphology:   Right Atrium: Right atrial size is within normal limits.   Right Ventricle: The right ventricular cavity is within normal limits.   Left Atrium: Left atrial size is normal in size with no left atrial appendage filling defect.   Left Ventricle: The ventricular cavity size is within normal limits.   Pulmonary arteries: Normal in size without proximal filling defect.   Pulmonary veins: Normal pulmonary venous drainage.   Pericardium: Normal thickness with no significant effusion or calcium present.   Mitral Valve: The mitral valve is normal structure without significant calcification.   Extra-cardiac findings: See attached radiology report for non-cardiac structures.   IMPRESSION: 1. Annular measurements appropriate for 23 mm S3 TAVR (413 mm2). Sizing is in between a 26/29 Evolut valve.   2. Moderate protruding calcification under the [place] that extends into the LVOT. Minimal calcification under the RCC.   3. Sufficient coronary to annulus distance. High take off of the LCA above the LCC noted.   4. Optimal Fluoroscopic Angle for Delivery: LAO 12 CAU 9   5. Very large hiatal hernia with displacement of the thoracic aorta into the R chest. Tortuous course noted.   [place] T. Audie Box, MD     Electronically Signed   By: Eleonore Chiquito M.D.   On: 10/04/2021 05:57   Narrative & Impression  CLINICAL DATA:  Aortic valve replacement preop evaluation   EXAM: CT CHEST, ABDOMEN AND PELVIS WITHOUT CONTRAST   TECHNIQUE: Multidetector CT imaging of  the chest, abdomen and pelvis was performed following the standard protocol without IV contrast.   RADIATION DOSE REDUCTION: This exam was performed according to the departmental dose-optimization program which includes automated exposure control, adjustment of the  mA and/or kV according to patient size and/or use of iterative reconstruction technique.   COMPARISON:  None Available.   FINDINGS: CTA CHEST FINDINGS   Cardiovascular: Mild cardiomegaly. No pericardial effusion. Normal caliber aorta with mild atherosclerotic disease. Aortic valve thickening and calcifications. No coronary artery calcifications. Standard three-vessel aortic arch with significant stenosis. No suspicious filling defects of the central pulmonary arteries.   Mediastinum/Nodes: Esophagus is unremarkable. No pathologically enlarged nodes seen in the chest.   Lungs/Pleura: Central airways are patent. No consolidation, pleural effusion or pneumothorax. Compressive atelectasis of the left lower lobe secondary to large hiatal hernia.   Musculoskeletal: No aggressive appearing osseous lesions. Dextrocurvature of the thoracic spine.   CTA ABDOMEN AND PELVIS FINDINGS   Hepatobiliary: No focal liver abnormality is seen. No gallstones, gallbladder wall thickening, or biliary dilatation.   Pancreas: Unremarkable. No pancreatic ductal dilatation or surrounding inflammatory changes.   Spleen: Normal in size without focal abnormality.   Adrenals/Urinary Tract: Bilateral adrenal glands are unremarkable. Kidneys enhance symmetrically with no evidence of hydronephrosis or nephrolithiasis. Right-greater-than-left cortical scarring. No suspicious renal lesions. Bladder is unremarkable.   Stomach/Bowel: Large hiatal hernia containing the stomach and a portion of the transverse colon. Stomach is within normal limits. Diverticulosis. No evidence of bowel wall thickening, distention, or inflammatory changes.   Vascular/lymphatic: No pathologically enlarged lymph nodes seen in the abdomen or pelvis. Normal caliber abdominal aorta with mild atherosclerotic disease. Severe narrowing at the origin of the SMA due to noncalcified plaque, otherwise no significant stenosis.    Reproductive: Uterus and bilateral adnexa are unremarkable.   Other: No abdominal wall hernia or abnormality. No abdominopelvic ascites.   Musculoskeletal: No aggressive appearing osseous lesions. Levocurvature of the lumbar spine. Degenerative disc disease of the lumbar spine.   VASCULAR MEASUREMENTS PERTINENT TO TAVR:   AORTA:   Minimal Aortic Diameter-13.8 mm   Severity of Aortic Calcification-mild   RIGHT PELVIS:   Right Common Iliac Artery -   Minimal Diameter-8.9 mm   Tortuosity-none   Calcification-mild   Right External Iliac Artery -   Minimal Diameter-7.4 mm   Tortuosity-none   Calcification-none   Right Common Femoral Artery -   Minimal Diameter-7.2 mm   Tortuosity-none   Calcification-none   LEFT PELVIS:   Left Common Iliac Artery -   Minimal Diameter-9.4 mm   Tortuosity-none   Calcification-mild   Left External Iliac Artery -   Minimal Diameter-7.4 mm   Tortuosity-moderate   Calcification-none   Left Common Femoral Artery -   Minimal Diameter-7.9 mm   Tortuosity-none   Calcification-none   Review of the MIP images confirms the above findings.   IMPRESSION: 1. Vascular findings and measurements pertinent to potential TAVR procedure, as detailed above. 2. Thickening and calcification of the aortic valve, compatible with reported clinical history of aortic stenosis. 3. Mild aortoiliac atherosclerosis. Severe narrowing at the origin of the SMA due to noncalcified plaque. 4. Large hiatal hernia containing the stomach and a portion of the transverse colon. Associated compressive atelectasis of the left lower lobe.     Electronically Signed   By: Yetta Glassman M.D.   On: 10/01/2021 12:44        Impression:  This 81 year old woman has stage D1, severe, symptomatic aortic stenosis with NYHA class  II symptoms of exertional fatigue and shortness of breath consistent with chronic diastolic congestive heart failure.  I  have personally reviewed her 2D echocardiogram, cardiac catheterization, and CTA studies.  Her echocardiogram shows a severely calcified and thickened aortic valve with restricted leaflet mobility.  The mean gradient was 38 mmHg with a peak gradient of 49 mmHg with a valve area of 0.5 cm with a dimensionless index of 0.25.  Left ventricular ejection fraction was normal.  Cardiac catheterization showed mild nonobstructive coronary disease.  Given her age I think that transcatheter aortic valve replacement would be the best treatment for improvement of her symptoms and to prevent left ventricular dysfunction.  Her gated cardiac CTA shows anatomy suitable for TAVR using a SAPIEN 3 valve.  She does have a tortuous aorta due to scoliosis.  Her abdominal and pelvic CTA shows adequate pelvic vascular anatomy to allow transfemoral insertion.  CT scan also shows a large hiatal hernia into the left chest containing the stomach and a portion of the transverse colon.  She denies any history of nausea, vomiting, or dysphagia but said that she has to watch how much she eats at one time.  This has been present on chest x-ray dating back to 2018 but looks like it may be getting larger over time.  It could certainly cause some shortness of breath if the hernia continues to increase with compression of her left lung.  It could be fixed surgically if needed.  The patient was counseled at length regarding treatment alternatives for management of severe symptomatic aortic stenosis. The risks and benefits of surgical intervention has been discussed in detail. Long-term prognosis with medical therapy was discussed. Alternative approaches such as conventional surgical aortic valve replacement, transcatheter aortic valve replacement, and palliative medical therapy were compared and contrasted at length. This discussion was placed in the context of the patient's own specific clinical presentation and past medical history. All of her  questions have been addressed.   Following the decision to proceed with transcatheter aortic valve replacement, a discussion was held regarding what types of management strategies would be attempted intraoperatively in the event of life-threatening complications, including whether or not the patient would be considered a candidate for the use of cardiopulmonary bypass and/or conversion to open sternotomy for attempted surgical intervention.  I think she would be a candidate for emergent  sternotomy to manage any intraoperative complications.  The patient is aware of the fact that transient use of cardiopulmonary bypass may be necessary. The patient has been advised of a variety of complications that might develop including but not limited to risks of death, stroke, paravalvular leak, aortic dissection or other major vascular complications, aortic annulus rupture, device embolization, cardiac rupture or perforation, mitral regurgitation, acute myocardial infarction, arrhythmia, heart block or bradycardia requiring permanent pacemaker placement, congestive heart failure, respiratory failure, renal failure, pneumonia, infection, other late complications related to structural valve deterioration or migration, or other complications that might ultimately cause a temporary or permanent loss of functional independence or other long term morbidity. The patient provides full informed consent for the procedure as described and all questions were answered.      Plan:  She will be scheduled for transfemoral TAVR using a SAPIEN 3 valve in the next few weeks.  I spent 60 minutes performing this consultation and > 50% of this time was spent face to face counseling and coordinating the care of this patient's severe symptomatic aortic stenosis.   Gaye Pollack, MD 11/17/2021

## 2021-11-24 ENCOUNTER — Encounter (HOSPITAL_COMMUNITY): Payer: Self-pay | Admitting: General Practice

## 2021-11-24 ENCOUNTER — Encounter (HOSPITAL_COMMUNITY): Payer: Self-pay | Admitting: Dentistry

## 2021-11-24 ENCOUNTER — Other Ambulatory Visit: Payer: Self-pay

## 2021-11-24 NOTE — Progress Notes (Signed)
PCP - Dr. Lenna Gilford  Cardiologist - Dr. Percival Spanish  EP- Denies  Endocrine- Denies  Pulm- Denies  Chest x-ray - Denies  EKG - 09/10/21 (E)  Stress Test - 04/24/20 (E)  ECHO - 05/25/21 (E)  Cardiac Cath - 09/22/21 (E)  AICD-na PM-na LOOP-na  Nerve Stimulator- Denies  Dialysis- Denies  Sleep Study - Denies CPAP - Denies  LABS- 11/25/21: CBC, BMP  ASA- Denies ELIQUIS- LD- 9/5  ERAS- No  HA1C- Denies  Anesthesia- No  Pt denies having chest pain, sob, or fever during the pre-op phone call. All instructions explained to the pt, with a verbal understanding of the material including: as of today, stop taking all Aspirin (unless instructed by your doctor) and Other Aspirin containing products, Vitamins, Fish oils, and Herbal medications. Also stop all NSAIDS i.e. Advil, Ibuprofen, Motrin, Aleve, Anaprox, Naproxen, BC, Goody Powders, and all Supplements.  Pt also instructed to wear a mask and social distance if she goes out. The opportunity to ask questions was provided.

## 2021-11-25 ENCOUNTER — Ambulatory Visit (HOSPITAL_COMMUNITY): Admission: RE | Admit: 2021-11-25 | Payer: PPO | Source: Ambulatory Visit | Admitting: Dentistry

## 2021-11-25 HISTORY — DX: Dyspnea, unspecified: R06.00

## 2021-11-25 SURGERY — DENTAL RESTORATION/EXTRACTIONS
Anesthesia: General

## 2021-11-29 ENCOUNTER — Ambulatory Visit
Admission: RE | Admit: 2021-11-29 | Discharge: 2021-11-29 | Disposition: A | Discharge status: Home / Self Care | Payer: PPO | Source: Ambulatory Visit | Attending: Family | Admitting: Family

## 2021-11-29 DIAGNOSIS — Z1231 Encounter for screening mammogram for malignant neoplasm of breast: Secondary | ICD-10-CM

## 2021-11-30 ENCOUNTER — Ambulatory Visit: Payer: PPO | Admitting: Family

## 2021-12-01 NOTE — Progress Notes (Signed)
Blood Thinner Instructions: Eliquis last dose 12/01/21 AM  ERAS Protcol - NPO  Anesthesia review: Y  Patient verbally denies any shortness of breath, fever, cough and chest pain during phone call   -------------  SDW INSTRUCTIONS given:  Your procedure is scheduled on 12/02/21.  Report to [facility] Main Entrance "A" at 1030 A.M., and check in at the Admitting office.  Call this number if you have problems the morning of surgery:  [phone]   Remember:  Do not eat or drink after midnight the night before your surgery    Take these medicines the morning of surgery with A SIP OF WATER  acetaminophen (TYLENOL)  digoxin (LANOXIN)  levothyroxine (SYNTHROID) metoprolol tartrate (LOPRESSOR) pantoprazole (PROTONIX)   As of today, STOP taking any Aspirin (unless otherwise instructed by your surgeon) Aleve, Naproxen, Ibuprofen, Motrin, Advil, Goody's, BC's, all herbal medications, fish oil, and all vitamins.                      Do not wear jewelry, make up, or nail polish            Do not wear lotions, powders, perfumes/colognes, or deodorant.            Do not shave 48 hours prior to surgery.  Men may shave face and neck.            Do not bring valuables to the hospital.            [facility] is not responsible for any belongings or valuables.  Do NOT Smoke (Tobacco/Vaping) 24 hours prior to your procedure If you use a CPAP at night, you may bring all equipment for your overnight stay.   Contacts, glasses, dentures or bridgework may not be worn into surgery.      For patients admitted to the hospital, discharge time will be determined by your treatment team.   Patients discharged the day of surgery will not be allowed to drive home, and someone needs to stay with them for 24 hours.    Special instructions:   [place]- Preparing For Surgery  Before surgery, you can play an important role. Because skin is not sterile, your skin needs to be as free of germs as possible.  You can reduce the number of germs on your skin by washing with CHG (chlorahexidine gluconate) Soap before surgery.  CHG is an antiseptic cleaner which kills germs and bonds with the skin to continue killing germs even after washing.    Oral Hygiene is also important to reduce your risk of infection.  Remember - BRUSH YOUR TEETH THE MORNING OF SURGERY WITH YOUR REGULAR TOOTHPASTE  Please do not use if you have an allergy to CHG or antibacterial soaps. If your skin becomes reddened/irritated stop using the CHG.  Do not shave (including legs and underarms) for at least 48 hours prior to first CHG shower. It is OK to shave your face.  Please follow these instructions carefully.   Shower the NIGHT BEFORE SURGERY and the MORNING OF SURGERY with DIAL Soap.   Pat yourself dry with a CLEAN TOWEL.  Wear CLEAN PAJAMAS to bed the night before surgery  Place CLEAN SHEETS on your bed the night of your first shower and DO NOT SLEEP WITH PETS.   Day of Surgery: Please shower morning of surgery  Wear Clean/Comfortable clothing the morning of surgery Do not apply any deodorants/lotions.   Remember to brush your teeth WITH YOUR REGULAR  TOOTHPASTE.   Questions were answered. Patient verbalized understanding of instructions.

## 2021-12-02 ENCOUNTER — Ambulatory Visit (HOSPITAL_BASED_OUTPATIENT_CLINIC_OR_DEPARTMENT_OTHER): Payer: PPO | Admitting: Physician Assistant

## 2021-12-02 ENCOUNTER — Encounter (HOSPITAL_COMMUNITY)
Admission: RE | Disposition: A | Discharge status: Home / Self Care | Payer: Self-pay | Source: Home / Self Care | Attending: Dentistry

## 2021-12-02 ENCOUNTER — Ambulatory Visit (HOSPITAL_COMMUNITY): Payer: PPO | Admitting: Physician Assistant

## 2021-12-02 ENCOUNTER — Ambulatory Visit (HOSPITAL_COMMUNITY)
Admission: RE | Admit: 2021-12-02 | Discharge: 2021-12-02 | Disposition: A | Discharge status: Home / Self Care | Payer: PPO | Attending: Dentistry | Admitting: Dentistry

## 2021-12-02 ENCOUNTER — Other Ambulatory Visit: Payer: Self-pay

## 2021-12-02 DIAGNOSIS — I480 Paroxysmal atrial fibrillation: Secondary | ICD-10-CM | POA: Insufficient documentation

## 2021-12-02 DIAGNOSIS — F430 Acute stress reaction: Secondary | ICD-10-CM | POA: Insufficient documentation

## 2021-12-02 DIAGNOSIS — K029 Dental caries, unspecified: Secondary | ICD-10-CM | POA: Insufficient documentation

## 2021-12-02 DIAGNOSIS — K0530 Chronic periodontitis, unspecified: Secondary | ICD-10-CM | POA: Diagnosis not present

## 2021-12-02 DIAGNOSIS — K06020 Generalized gingival recession, unspecified: Secondary | ICD-10-CM | POA: Insufficient documentation

## 2021-12-02 DIAGNOSIS — K449 Diaphragmatic hernia without obstruction or gangrene: Secondary | ICD-10-CM | POA: Diagnosis not present

## 2021-12-02 DIAGNOSIS — R0602 Shortness of breath: Secondary | ICD-10-CM | POA: Insufficient documentation

## 2021-12-02 DIAGNOSIS — K083 Retained dental root: Secondary | ICD-10-CM

## 2021-12-02 DIAGNOSIS — I350 Nonrheumatic aortic (valve) stenosis: Secondary | ICD-10-CM | POA: Diagnosis not present

## 2021-12-02 DIAGNOSIS — R7303 Prediabetes: Secondary | ICD-10-CM | POA: Insufficient documentation

## 2021-12-02 DIAGNOSIS — D509 Iron deficiency anemia, unspecified: Secondary | ICD-10-CM | POA: Diagnosis not present

## 2021-12-02 DIAGNOSIS — I10 Essential (primary) hypertension: Secondary | ICD-10-CM

## 2021-12-02 DIAGNOSIS — R5383 Other fatigue: Secondary | ICD-10-CM | POA: Insufficient documentation

## 2021-12-02 DIAGNOSIS — E039 Hypothyroidism, unspecified: Secondary | ICD-10-CM

## 2021-12-02 DIAGNOSIS — M270 Developmental disorders of jaws: Secondary | ICD-10-CM | POA: Insufficient documentation

## 2021-12-02 DIAGNOSIS — K219 Gastro-esophageal reflux disease without esophagitis: Secondary | ICD-10-CM | POA: Insufficient documentation

## 2021-12-02 DIAGNOSIS — Z7722 Contact with and (suspected) exposure to environmental tobacco smoke (acute) (chronic): Secondary | ICD-10-CM

## 2021-12-02 DIAGNOSIS — Z7901 Long term (current) use of anticoagulants: Secondary | ICD-10-CM | POA: Insufficient documentation

## 2021-12-02 HISTORY — PX: MULTIPLE EXTRACTIONS WITH ALVEOLOPLASTY: SHX5342

## 2021-12-02 LAB — CBC
HCT: 43 % (ref 36.0–46.0)
Hemoglobin: 15.1 g/dL — ABNORMAL HIGH (ref 12.0–15.0)
MCH: 31.1 pg (ref 26.0–34.0)
MCHC: 35.1 g/dL (ref 30.0–36.0)
MCV: 88.5 fL (ref 80.0–100.0)
Platelets: 228 10*3/uL (ref 150–400)
RBC: 4.86 MIL/uL (ref 3.87–5.11)
RDW: 12.5 % (ref 11.5–15.5)
WBC: 9.9 10*3/uL (ref 4.0–10.5)
nRBC: 0 % (ref 0.0–0.2)

## 2021-12-02 LAB — BASIC METABOLIC PANEL
Anion gap: 9 (ref 5–15)
BUN: 19 mg/dL (ref 8–23)
CO2: 26 mmol/L (ref 22–32)
Calcium: 9.2 mg/dL (ref 8.9–10.3)
Chloride: 106 mmol/L (ref 98–111)
Creatinine, Ser: 0.89 mg/dL (ref 0.44–1.00)
GFR, Estimated: >60 mL/min (ref >60)
Glucose, Bld: 112 mg/dL — ABNORMAL HIGH (ref 70–99)
Potassium: 3.5 mmol/L (ref 3.5–5.1)
Sodium: 141 mmol/L (ref 135–145)

## 2021-12-02 SURGERY — MULTIPLE EXTRACTION WITH ALVEOLOPLASTY
Anesthesia: General | Site: Mouth

## 2021-12-02 MED ORDER — ONDANSETRON HCL 4 MG/2ML IJ SOLN
INTRAMUSCULAR | Status: AC
  Filled 2021-12-02: qty 2

## 2021-12-02 MED ORDER — AMISULPRIDE (ANTIEMETIC) 5 MG/2ML IV SOLN
INTRAVENOUS | Status: AC
  Filled 2021-12-02: qty 2

## 2021-12-02 MED ORDER — LIDOCAINE-EPINEPHRINE 2 %-1:100000 IJ SOLN
INTRAMUSCULAR | Status: DC | PRN
  Administered 2021-12-02: 5.1 mL

## 2021-12-02 MED ORDER — PHENYLEPHRINE 80 MCG/ML (10ML) SYRINGE FOR IV PUSH (FOR BLOOD PRESSURE SUPPORT)
PREFILLED_SYRINGE | INTRAVENOUS | Status: AC
  Filled 2021-12-02: qty 10

## 2021-12-02 MED ORDER — ONDANSETRON HCL 4 MG/2ML IJ SOLN
INTRAMUSCULAR | Status: DC | PRN
  Administered 2021-12-02: 4 mg via INTRAVENOUS

## 2021-12-02 MED ORDER — PHENYLEPHRINE 80 MCG/ML (10ML) SYRINGE FOR IV PUSH (FOR BLOOD PRESSURE SUPPORT)
PREFILLED_SYRINGE | INTRAVENOUS | Status: DC | PRN
  Administered 2021-12-02 (×3): 80 ug via INTRAVENOUS

## 2021-12-02 MED ORDER — GLYCOPYRROLATE PF 0.2 MG/ML IJ SOSY
PREFILLED_SYRINGE | INTRAMUSCULAR | Status: AC
  Filled 2021-12-02: qty 1

## 2021-12-02 MED ORDER — FENTANYL CITRATE (PF) 250 MCG/5ML IJ SOLN
INTRAMUSCULAR | Status: DC | PRN
  Administered 2021-12-02: 100 ug via INTRAVENOUS

## 2021-12-02 MED ORDER — OXYMETAZOLINE HCL 0.05 % NA SOLN
NASAL | Status: AC
  Filled 2021-12-02: qty 30

## 2021-12-02 MED ORDER — CHLORHEXIDINE GLUCONATE 0.12 % MT SOLN
15.0000 mL | Freq: Once | OROMUCOSAL | Status: AC
  Administered 2021-12-02: 15 mL via OROMUCOSAL
  Filled 2021-12-02: qty 15

## 2021-12-02 MED ORDER — LIDOCAINE 2% (20 MG/ML) 5 ML SYRINGE
INTRAMUSCULAR | Status: AC
  Filled 2021-12-02: qty 5

## 2021-12-02 MED ORDER — ROCURONIUM BROMIDE 10 MG/ML (PF) SYRINGE
PREFILLED_SYRINGE | INTRAVENOUS | Status: DC | PRN
  Administered 2021-12-02: 40 mg via INTRAVENOUS

## 2021-12-02 MED ORDER — PROPOFOL 10 MG/ML IV BOLUS
INTRAVENOUS | Status: AC
  Filled 2021-12-02: qty 20

## 2021-12-02 MED ORDER — BUPIVACAINE-EPINEPHRINE (PF) 0.5% -1:200000 IJ SOLN
INTRAMUSCULAR | Status: AC
  Filled 2021-12-02: qty 3.6

## 2021-12-02 MED ORDER — METRONIDAZOLE 500 MG/100ML IV SOLN
500.0000 mg | Freq: Once | INTRAVENOUS | Status: AC
  Administered 2021-12-02: 500 mg via INTRAVENOUS
  Filled 2021-12-02: qty 100

## 2021-12-02 MED ORDER — AMISULPRIDE (ANTIEMETIC) 5 MG/2ML IV SOLN
5.0000 mg | Freq: Once | INTRAVENOUS | Status: AC
  Administered 2021-12-02: 5 mg via INTRAVENOUS

## 2021-12-02 MED ORDER — SUGAMMADEX SODIUM 200 MG/2ML IV SOLN
INTRAVENOUS | Status: DC | PRN
  Administered 2021-12-02: 200 mg via INTRAVENOUS

## 2021-12-02 MED ORDER — LIDOCAINE 2% (20 MG/ML) 5 ML SYRINGE
INTRAMUSCULAR | Status: DC | PRN
  Administered 2021-12-02: 60 mg via INTRAVENOUS

## 2021-12-02 MED ORDER — ETOMIDATE 2 MG/ML IV SOLN
INTRAVENOUS | Status: AC
  Filled 2021-12-02: qty 10

## 2021-12-02 MED ORDER — LACTATED RINGERS IV SOLN: INTRAVENOUS | Status: DC

## 2021-12-02 MED ORDER — ORAL CARE MOUTH RINSE: 15.0000 mL | Freq: Once | OROMUCOSAL | Status: AC

## 2021-12-02 MED ORDER — FENTANYL CITRATE (PF) 250 MCG/5ML IJ SOLN
INTRAMUSCULAR | Status: AC
  Filled 2021-12-02: qty 5

## 2021-12-02 MED ORDER — LIDOCAINE-EPINEPHRINE 2 %-1:100000 IJ SOLN
INTRAMUSCULAR | Status: AC
  Filled 2021-12-02: qty 10.2

## 2021-12-02 MED ORDER — DEXAMETHASONE SODIUM PHOSPHATE 10 MG/ML IJ SOLN
INTRAMUSCULAR | Status: DC | PRN
  Administered 2021-12-02: 5 mg via INTRAVENOUS

## 2021-12-02 MED ORDER — PHENYLEPHRINE HCL-NACL 20-0.9 MG/250ML-% IV SOLN
INTRAVENOUS | Status: DC | PRN
  Administered 2021-12-02: 35 ug/min via INTRAVENOUS

## 2021-12-02 MED ORDER — FENTANYL CITRATE (PF) 100 MCG/2ML IJ SOLN: 25.0000 ug | INTRAMUSCULAR | Status: DC | PRN

## 2021-12-02 MED ORDER — 0.9 % SODIUM CHLORIDE (POUR BTL) OPTIME
TOPICAL | Status: DC | PRN
  Administered 2021-12-02: 1000 mL

## 2021-12-02 MED ORDER — ROCURONIUM BROMIDE 10 MG/ML (PF) SYRINGE
PREFILLED_SYRINGE | INTRAVENOUS | Status: AC
  Filled 2021-12-02: qty 10

## 2021-12-02 MED ORDER — ETOMIDATE 2 MG/ML IV SOLN
INTRAVENOUS | Status: DC | PRN
  Administered 2021-12-02: 12 mg via INTRAVENOUS

## 2021-12-02 MED ORDER — CEFAZOLIN SODIUM 1 G IJ SOLR
INTRAMUSCULAR | Status: AC
  Filled 2021-12-02: qty 20

## 2021-12-02 MED ORDER — HEMOSTATIC AGENTS (NO CHARGE) OPTIME
TOPICAL | Status: DC | PRN
  Administered 2021-12-02: 1 via TOPICAL

## 2021-12-02 SURGICAL SUPPLY — 37 items
ALCOHOL 70% 16 OZ (MISCELLANEOUS) ×1 IMPLANT
BAG COUNTER SPONGE SURGICOUNT (BAG) ×1 IMPLANT
BAG SPNG CNTER NS LX DISP (BAG) ×1
BLADE SURG 15 STRL LF DISP TIS (BLADE) ×1 IMPLANT
BLADE SURG 15 STRL SS (BLADE) ×1
COVER SURGICAL LIGHT HANDLE (MISCELLANEOUS) ×1 IMPLANT
GAUZE 4X4 16PLY ~~LOC~~+RFID DBL (SPONGE) ×1 IMPLANT
GAUZE PACKING FOLDED 2  STR (GAUZE/BANDAGES/DRESSINGS) ×1
GAUZE PACKING FOLDED 2 STR (GAUZE/BANDAGES/DRESSINGS) ×1 IMPLANT
GLOVE SURG ENC MOIS LTX SZ6.5 (GLOVE) ×1 IMPLANT
GLOVE SURG POLYISO LF SZ6 (GLOVE) ×1 IMPLANT
GOWN STRL REUS W/ TWL LRG LVL3 (GOWN DISPOSABLE) ×2 IMPLANT
GOWN STRL REUS W/TWL LRG LVL3 (GOWN DISPOSABLE) ×2
KIT BASIN OR (CUSTOM PROCEDURE TRAY) ×1 IMPLANT
KIT TURNOVER KIT B (KITS) ×1 IMPLANT
MANIFOLD NEPTUNE II (INSTRUMENTS) ×1 IMPLANT
NDL BLUNT 16X1.5 OR ONLY (NEEDLE) ×1 IMPLANT
NDL DENTAL 27 LONG (NEEDLE) ×2 IMPLANT
NEEDLE BLUNT 16X1.5 OR ONLY (NEEDLE) ×1 IMPLANT
NEEDLE DENTAL 27 LONG (NEEDLE) ×2 IMPLANT
NS IRRIG 1000ML POUR BTL (IV SOLUTION) ×1 IMPLANT
PACK EENT II TURBAN DRAPE (CUSTOM PROCEDURE TRAY) ×1 IMPLANT
PAD ARMBOARD 7.5X6 YLW CONV (MISCELLANEOUS) ×1 IMPLANT
SPONGE SURGIFOAM ABS GEL 100 (HEMOSTASIS) IMPLANT
SPONGE SURGIFOAM ABS GEL 12-7 (HEMOSTASIS) IMPLANT
SPONGE SURGIFOAM ABS GEL SZ50 (HEMOSTASIS) IMPLANT
SUCTION FRAZIER HANDLE 10FR (MISCELLANEOUS) ×1
SUCTION TUBE FRAZIER 10FR DISP (MISCELLANEOUS) ×1 IMPLANT
SUT CHROMIC 3 0 PS 2 (SUTURE) ×2 IMPLANT
SUT CHROMIC 4 0 P 3 18 (SUTURE) IMPLANT
SYR 50ML SLIP (SYRINGE) ×1 IMPLANT
SYR BULB IRRIG 60ML STRL (SYRINGE) ×1 IMPLANT
TOWEL GREEN STERILE FF (TOWEL DISPOSABLE) ×1 IMPLANT
TUBE CONNECTING 12X1/4 (SUCTIONS) ×1 IMPLANT
WATER STERILE IRR 1000ML POUR (IV SOLUTION) ×1 IMPLANT
WATER TABLETS ICX (MISCELLANEOUS) ×1 IMPLANT
YANKAUER SUCT BULB TIP NO VENT (SUCTIONS) ×1 IMPLANT

## 2021-12-02 NOTE — Interval H&P Note (Signed)
History and Physical Interval Note:  12/02/2021   Autumn Moyer  has presented today for surgery, with the diagnosis of dental caries.  The various methods of treatment have been discussed with the patient and family. After consideration of risks, benefits and other options for treatment, the patient has consented to the procedure(s): MULTIPLE EXTRACTIONS WITH ALVEOLOPLASTY as a surgical intervention.  The patient's history has been reviewed, patient examined, no change in status, stable for surgery.  I have reviewed the patient's chart and labs.  Questions were answered to the patient's satisfaction.     -Sandi Mariscal, DMD

## 2021-12-02 NOTE — Transfer of Care (Signed)
Immediate Anesthesia Transfer of Care Note  Patient: Autumn Moyer  Procedure(s) Performed: MULTIPLE EXTRACTION WITH ALVEOLOPLASTY (Mouth)  Patient Location: PACU  Anesthesia Type:General  Level of Consciousness: awake, oriented and drowsy  Airway & Oxygen Therapy: Patient connected to nasal cannula oxygen  Post-op Assessment: Report given to RN and Post -op Vital signs reviewed and stable  Post vital signs: Reviewed and stable  Last Vitals:  Vitals Value Taken Time  BP 141/85 12/02/21 1440  Temp    Pulse 97 12/02/21 1443  Resp 10 12/02/21 1443  SpO2 99 % 12/02/21 1443  Vitals shown include unvalidated device data.  Last Pain:  Vitals:   12/02/21 1053  TempSrc:   PainSc: 0-No pain         Complications: No notable events documented.

## 2021-12-02 NOTE — Discharge Instructions (Signed)
[facility] DEPARTMENT OF DENTAL MEDICINE Dr. Debe Coder B. Benson [place], D.M.D. Phone: [phone] Fax: [phone]    MOUTH CARE AFTER SURGERY  RESUME TAKING ELIQUIS TOMORROW (9/15) IN THE EVENING.  SKIP MORNING DOSE AND RESUME IN THE EVENING.    FACTS: Ice used in ice bag helps keep the swelling down, and can help lessen the pain for the first 24 hours after surgery. It is easier to treat pain BEFORE it happens. Spitting disturbs the clot and may cause bleeding to start again, or to get worse. Smoking delays healing and can cause complications. Sharing prescriptions can be dangerous.  Do not take medications not recently prescribed for you. Antibiotics may stop birth control pills from working.  Use other means of birth control while on antibiotics. Warm salt water rinses after the first 24 hours will help lessen the swelling:  Use 1/2 teaspoonful of table salt per oz.of water.    DO NOT: Spit Drink through a straw It is strongly advised not to smoke, dip snuff or chew tobacco for at least 3 days. Eat sharp or crunchy foods.  Avoid the area of surgery when chewing. Stop your antibiotics before your instructions say to do so. Eat hot foods until bleeding has stopped.  If you need to, let your food cool down to room temperature.      WHAT TO EXPECT: Some swelling, especially during the first 2-3 days. Soreness or discomfort in varying degrees.  Follow your dentist's instructions about how to handle pain before it starts. Pinkish saliva or light blood in saliva, or on your pillow in the morning.  This can last around 24 hours. Bruising inside or outside the mouth.  This may not show up until 2-3 days after surgery.  Don't worry, it will go away in time. Pieces of "bone" may work themselves loose.  It's OK.  If they bother you, let us know.     WHAT TO DO IMMEDIATELY AFTER SURGERY: Bite on gauze with steady pressure for 30-45 minutes at a time.   Switch out the gauze after 30-45 minutes for clean gauze, and continue this for 1-2 hours or until bleeding subsides. Do not chew on the gauze. Do not lie down flat.  Raise your head support especially for the first 24 hours. Apply ice to your face on the side of the surgery.  You may apply it 20 minutes on and a few minutes off.  Ice for 8-12 hours.  You may use ice up to 24 hours. Before the numbness wears off, take a pain pill as instructed. Prescription pain medication is not always required. Tylenol  500 mg every 4-6 hours for up to 3 days is recommended for postoperative pain management.     SWELLING: Expect swelling for the first couple of days.  It should get better after that. If swelling increases 3 days or so after surgery, let us know as soon as possible.    FEVER: Take Tylenol every 4 hours if needed to lower your temperature, especially if it is at 100oF or higher. Drink lots of fluids. If the fever does not go away, let us know.    BREATHING: Any unusual difficulty breathing means you have to have someone bring you to the emergency room ASAP.    BLEEDING: Light oozing is expected for 24 hours or so. Prop head up with pillows. Do not spit. Do not confuse bright red fresh flowing blood with lots of saliva colored with a little  bit of blood. If you notice some bleeding, place gauze or a tea bag where it is bleeding and apply CONSTANT pressure by biting down for 1 hour.  Avoid talking during this time.  Do not remove the gauze or tea bag during this hour to "check" the bleeding. If you notice bright RED bleeding FLOWING out of particular area, and filling the floor of your mouth, put a wad of gauze on that area, bite down firmly and constantly.  Call us immediately.  If we're closed, have someone bring you to the emergency room.     ORAL HYGIENE: Brush your teeth as usual after meals and before bedtime. Use a soft toothbrush around the area of surgery. DO NOT AVOID  BRUSHING.  Otherwise bacteria(germs) will grow and may delay healing or encourage infection. Since you cannot spit, just gently rinse and let the water flow out of your mouth. DO NOT SWISH HARD.     EATING: Cool liquids are a good point to start.  Increase to soft foods as tolerated.     PRESCRIPTIONS: Follow the directions for your prescriptions exactly as written. If your doctor gave you a narcotic pain medication, do not drive, operate machinery or drink alcohol when on that medication.    Questions?  Call our office during office hours at [phone] or call the Emergency Room at [phone].

## 2021-12-02 NOTE — Anesthesia Preprocedure Evaluation (Addendum)
Anesthesia Evaluation  Patient identified by MRN, date of birth, ID band Patient awake    Reviewed: Allergy & Precautions, NPO status , Patient's Chart, lab work & pertinent test results  Airway Mallampati: II       Dental   Pulmonary shortness of breath,    breath sounds clear to auscultation       Cardiovascular hypertension, + Valvular Problems/Murmurs AS  Rhythm:Regular Rate:Normal + Systolic murmurs    Neuro/Psych PSYCHIATRIC DISORDERS  Neuromuscular disease    GI/Hepatic Neg liver ROS, hiatal hernia, GERD  ,  Endo/Other  Hypothyroidism   Renal/GU negative Renal ROS     Musculoskeletal   Abdominal   Peds  Hematology   Anesthesia Other Findings   Reproductive/Obstetrics                            Anesthesia Physical Anesthesia Plan  ASA: 3  Anesthesia Plan: General   Post-op Pain Management:    Induction: Intravenous  PONV Risk Score and Plan: Treatment may vary due to age or medical condition and Ondansetron  Airway Management Planned: Oral ETT  Additional Equipment: Arterial line  Intra-op Plan:   Post-operative Plan:   Informed Consent: I have reviewed the patients History and Physical, chart, labs and discussed the procedure including the risks, benefits and alternatives for the proposed anesthesia with the patient or authorized representative who has indicated his/her understanding and acceptance.     Dental advisory given  Plan Discussed with: CRNA and Anesthesiologist  Anesthesia Plan Comments:         Anesthesia Quick Evaluation

## 2021-12-02 NOTE — Progress Notes (Signed)
Lab called stating BMP hemolyzed. Redrawn and sent.

## 2021-12-02 NOTE — Anesthesia Procedure Notes (Addendum)
Procedure Name: Intubation Date/Time: 12/02/2021 1:41 PM  Performed by: Anastasio Auerbach, CRNAPre-anesthesia Checklist: Patient identified, Emergency Drugs available, Suction available and Patient being monitored Patient Re-evaluated:Patient Re-evaluated prior to induction Oxygen Delivery Method: Circle system utilized Preoxygenation: Pre-oxygenation with 100% oxygen Induction Type: IV induction Ventilation: Mask ventilation without difficulty Laryngoscope Size: Mac and 3 Grade View: Grade I Tube type: Oral Rae Tube size: 7.0 mm Number of attempts: 1 Airway Equipment and Method: Stylet and Oral airway Placement Confirmation: ETT inserted through vocal cords under direct vision, positive ETCO2 and breath sounds checked- equal and bilateral Secured at: 21 cm Tube secured with: Tape Dental Injury: Teeth and Oropharynx as per pre-operative assessment

## 2021-12-02 NOTE — Op Note (Signed)
[facility] Department of Dental Medicine   OPERATIVE REPORT  DATE OF SURGERY:   12/02/2021  PATIENT'S NAME:   Autumn Moyer DATE OF BIRTH:   December 16, 1940 MEDICAL RECORD NUMBER:  [id number]  SURGEON:   Catina Nuss B. 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) 02/10/2020   Aortic stenosis 10/09/2018   Prediabetes 06/29/2016   Obesity (BMI 30.0-34.9) 11/27/2015   Allergic rhinitis 05/22/2015   Iron deficiency anemia 05/22/2015   Murmur 09/05/2014   Essential hypertension, benign 11/15/2013   Esophageal reflux 11/15/2013   Hypothyroid 06/11/2010   Diverticular disease 06/11/2010   Post-menopausal 06/11/2010    POSTOPERATIVE DIAGNOSES:  Dental caries, chronic periodontitis   PROCEDURES PERFORMED: Extractions of teeth numbers 3, 13, 19, 30 and 31  ANESTHESIA:  General anesthesia via endotracheal tube.  MEDICATIONS: Cipro IV prior to invasive dental procedures. Local anesthesia with a total utilization of 3 cartridges of 34 mg of lidocaine with 0.018 mg of epinephrine/ea.  SPECIMENS:  5 teeth that were extracted and discarded  DRAINS/CULTURES:   None  COMPLICATIONS:  PFM crown on tooth #29 came off while elevating tooth #30. The crown was saved and we will plan to re-cement it during the patient's postoperative visit in 1 week.  ESTIMATED BLOOD LOSS:  5 mL  INTRAVENOUS FLUIDS:  10 mL of Lactated ringers solution  INDICATIONS:  The patient was recently diagnosed with severe aortic stenosis.  A medically necessary dental consult was then requested to evaluate the patient for any dental/orofacial infection and their overall oral health.  The patient was examined and subsequently treatment planned for multiple extractions of chronically infected and decayed teeth.  This treatment plan was made to decrease the perioperative and postoperative risks and complications associated with dental/orofacial infection from affecting the patient's systemic health.  OPERATIVE FINDINGS:  The patient was examined in operating room number 10.  The indicated teeth were identified and verified for extraction. The patient was noted be affected by severe dental decay, chronic periodontitis and chronic apical periodontitis.  DESCRIPTION OF PROCEDURE:  The patient was identified in the holding area and brought to the main operating room number 10 by the anesthesia team. The patient was then placed in the supine position on the operating table.  General anesthesia was then induced per the anesthesia team. The patient was then prepped and draped in the usual sterile fashion for dental medicine procedures.  A timeout was performed. The patient was identified and procedures were verified. A throat pack was placed at this time. The oral cavity was then thoroughly examined with the findings noted above. The patient was then ready for the dental medicine procedure as follows:   ANESTHESIA: Local anesthesia was administered sequentially with a  total utilization of 3 cartridges each containing 34 mg of lidocaine with 0.018 mg of epinephrine.  Location of anesthesia included  upper right and left quadrants infiltration and palatal, lower right and left quadrants infiltration, mental nerve block, lingual and buccal nerve block.  Aspiration negative.  ROUTINE EXTRACTIONS: The maxillary left and right quadrants were first approached. The teeth were then subluxated with a series of straight elevators.  Teeth numbers 3 and 13 were then removed with a 150 forceps and rongeurs without complications. The tissues were approximated and trimmed appropriately to help achieve primary closure.  The surgical sites were then irrigated with copious amounts of sterile saline.  The surgical sites were closed using 3-0 chromic gut sutures as follows: #3 extraction site 1 figure-8 style suture.  The mandibular left and right quadrants were then approached. The teeth were subluxated with a series of straight elevators.  Teeth numbers 19, 30 and 31 were then removed utilizing a 23 cowhorn and 151 forceps. Upon elevating tooth #30, the existing PFM crown came off of tooth #29.  The crown was determined to be good condition, and tooth #29 was evaluated and appeared to be sound tooth structure with no decay. The tissues were approximated and trimmed appropriately to help achieve primary closure. The surgical sites were then irrigated with copious amounts of sterile saline.  Surgi Foam was placed in each extraction site.  The surgical sites were closed using 3-0 chromic gut sutures as follows: #19 extraction site 1 simple interrupted and #30 and #31 extraction sites 1 figure-8 style suture.   END OF PROCEDURE: Thorough oral irrigation with sterile saline was performed.  Hemostasis was observed.  The patient was examined for complications, and seeing none, the dental medicine procedure was deemed to be complete.  The throat pack was removed at this time. A series of 4x4 gauze were placed in the mouth to aid hemostasis as needed.  The patient was then handed over to the anesthesia team for final  disposition.  After an appropriate amount of time, the patient was extubated and taken to the postanesthsia care unit in stable condition.  All counts were correct for the dental medicine procedure.    Brea Benson Norway, DMD    Phone: [phone]    Pager:  [phone]

## 2021-12-02 NOTE — Anesthesia Procedure Notes (Signed)
Arterial Line Insertion Start/End9/14/2023 1:00 PM Performed by: Anastasio Auerbach, CRNA, CRNA  Patient location: Pre-op. Preanesthetic checklist: patient identified, IV checked, site marked, risks and benefits discussed, surgical consent, monitors and equipment checked, pre-op evaluation, timeout performed and anesthesia consent Lidocaine 1% used for infiltration Left, radial was placed Catheter size: 20 G Hand hygiene performed , maximum sterile barriers used  and Seldinger technique used Allen's test indicative of satisfactory collateral circulation Attempts: 2 Procedure performed without using ultrasound guided technique. Following insertion, Biopatch and dressing applied. Post procedure assessment: normal  Post procedure complications: unsuccessful attempts. Patient tolerated the procedure well with no immediate complications. Additional procedure comments: Attempt #1left radial unable to thread catheter. #2 easily placed .

## 2021-12-03 ENCOUNTER — Encounter (HOSPITAL_COMMUNITY): Payer: Self-pay | Admitting: Dentistry

## 2021-12-03 ENCOUNTER — Ambulatory Visit: Payer: PPO | Admitting: Family

## 2021-12-03 ENCOUNTER — Telehealth: Payer: Self-pay | Admitting: Family

## 2021-12-03 DIAGNOSIS — I10 Essential (primary) hypertension: Secondary | ICD-10-CM

## 2021-12-03 DIAGNOSIS — K219 Gastro-esophageal reflux disease without esophagitis: Secondary | ICD-10-CM

## 2021-12-03 MED ORDER — BENAZEPRIL-HYDROCHLOROTHIAZIDE 20-25 MG PO TABS: 1.0000 | ORAL_TABLET | Freq: Every day | ORAL | 1 refills | Status: DC

## 2021-12-03 MED ORDER — PANTOPRAZOLE SODIUM 40 MG PO TBEC: 40.0000 mg | DELAYED_RELEASE_TABLET | Freq: Every day | ORAL | 1 refills | Status: DC

## 2021-12-03 NOTE — Telephone Encounter (Signed)
Refills sent

## 2021-12-03 NOTE — Telephone Encounter (Signed)
Pt had to cancel her apt today because she had dental surgery yesterday. Pt has upcoming apt on 12/17/2021    Prescription Request  12/03/2021  Is this a "Controlled Substance" medicine? pantoprazole (PROTONIX) 40 MG tablet benazepril-hydrochlorthiazide (LOTENSIN HCT) 20-25 MG tablet  Have you seen your PCP in the last 2 weeks? No upcoming apt 12/17/2021  If YES, route message to pool  -  If NO, patient needs to be scheduled for appointment.  What is the name of the medication or equipment? benazepril-hydrochlorthiazide (LOTENSIN HCT) 20-25 MG tablet pantoprazole (PROTONIX) 40 MG tablet  Have you contacted your pharmacy to request a refill? yes   Which pharmacy would you like this sent to? [facility]   Patient notified that their request is being sent to the clinical staff for review and that they should receive a response within 2 business days.

## 2021-12-07 NOTE — Anesthesia Postprocedure Evaluation (Signed)
Anesthesia Post Note  Patient: Autumn Moyer  Procedure(s) Performed: MULTIPLE EXTRACTION WITH ALVEOLOPLASTY (Mouth)     Patient location during evaluation: PACU Anesthesia Type: General Level of consciousness: awake Pain management: pain level controlled Respiratory status: spontaneous breathing Cardiovascular status: stable Postop Assessment: no apparent nausea or vomiting Anesthetic complications: no   No notable events documented.  Last Vitals:  Vitals:   12/02/21 1500 12/02/21 1515  BP: 135/72 136/73  Pulse: 90 90  Resp: 13 18  Temp:  (!) 36.1 C  SpO2: 94% 98%    Last Pain:  Vitals:   12/02/21 1515  TempSrc:   PainSc: 0-No pain                 Tarek Cravens

## 2021-12-08 ENCOUNTER — Other Ambulatory Visit: Payer: Self-pay

## 2021-12-08 DIAGNOSIS — I350 Nonrheumatic aortic (valve) stenosis: Secondary | ICD-10-CM

## 2021-12-09 NOTE — Progress Notes (Signed)
Surgical Instructions    Your procedure is scheduled on Tuesday, September 26th, 2023.   Report to [facility] Main Entrance "A" at 11:45 A.M., then check in with the Admitting office.  Call this number if you have problems the morning of surgery:  [phone]   If you have any questions prior to your surgery date call [phone]: Open Monday-Friday 8am-4pm    Remember:  Do not eat or drink after midnight the night before your surgery    STOP now, 9/20, taking any Aspirin (unless otherwise instructed by your surgeon), Aleve, Naproxen, Ibuprofen, Motrin, Advil, Goody's, BC's, all herbal medications, fish oil, and all non-prescription vitamins.   Stop taking Eliquis on Thursday, 9/21. You will take your last dose on Wednesday, 9/20.   Continue taking all other medications without change through the day before surgery. On the morning of surgery take only Levothyroxine with a sip of water   The day of surgery:          Do not wear jewelry or makeup. Do not wear lotions, powders, perfumes or deodorant. Do not shave 48 hours prior to surgery.   Do not bring valuables to the hospital. Do not wear nail polish, gel polish, artificial nails, or any other type of covering on natural nails (fingers and toes) If you have artificial nails or gel coating that need to be removed by a nail salon, please have this removed prior to surgery. Artificial nails or gel coating may interfere with anesthesia's ability to adequately monitor your vital signs.  [facility] is not responsible for any belongings or valuables.    Do NOT Smoke (Tobacco/Vaping)  24 hours prior to your procedure  If you use a CPAP at night, you may bring your mask for your overnight stay.   Contacts, glasses, hearing aids, dentures or partials may not be worn into surgery, please bring cases for these belongings   For patients admitted to the hospital, discharge time will be determined by your treatment team.   Patients  discharged the day of surgery will not be allowed to drive home, and someone needs to stay with them for 24 hours.   SURGICAL WAITING ROOM VISITATION Patients having surgery or a procedure may have no more than 2 support people in the waiting area - these visitors may rotate.   Children under the age of 45 must have an adult with them who is not the patient. If the patient needs to stay at the hospital during part of their recovery, the visitor guidelines for inpatient rooms apply. Pre-op nurse will coordinate an appropriate time for 1 support person to accompany patient in pre-op.  This support person may not rotate.   Please refer to the [facility] website for the visitor guidelines for Inpatients (after your surgery is over and you are in a regular room).    Special instructions:    Oral Hygiene is also important to reduce your risk of infection.  Remember - BRUSH YOUR TEETH THE MORNING OF SURGERY WITH YOUR REGULAR TOOTHPASTE   [facility]- Preparing For Surgery  Before surgery, you can play an important role. Because skin is not sterile, your skin needs to be as free of germs as possible. You can reduce the number of germs on your skin by washing with CHG (chlorahexidine gluconate) Soap before surgery.  CHG is an antiseptic cleaner which kills germs and bonds with the skin to continue killing germs even after washing.     Please do not use  if you have an allergy to CHG or antibacterial soaps. If your skin becomes reddened/irritated stop using the CHG.  Do not shave (including legs and underarms) for at least 48 hours prior to first CHG shower. It is OK to shave your face.  Please follow these instructions carefully.     Shower the NIGHT BEFORE SURGERY and the MORNING OF SURGERY with CHG Soap.   If you chose to wash your hair, wash your hair first as usual with your normal shampoo. After you shampoo, rinse your hair and body thoroughly to remove the shampoo.  Then [employer] and  genitals (private parts) with your normal soap and rinse thoroughly to remove soap.  After that Use CHG Soap as you would any other liquid soap. You can apply CHG directly to the skin and wash gently with a scrungie or a clean washcloth.   Apply the CHG Soap to your body ONLY FROM THE NECK DOWN.  Do not use on open wounds or open sores. Avoid contact with your eyes, ears, mouth and genitals (private parts). Wash Face and genitals (private parts)  with your normal soap.   Wash thoroughly, paying special attention to the area where your surgery will be performed.  Thoroughly rinse your body with warm water from the neck down.  DO NOT shower/wash with your normal soap after using and rinsing off the CHG Soap.  Pat yourself dry with a CLEAN TOWEL.  Wear CLEAN PAJAMAS to bed the night before surgery  Place CLEAN SHEETS on your bed the night before your surgery  DO NOT SLEEP WITH PETS.   Day of Surgery:  Take a shower with CHG soap. Wear Clean/Comfortable clothing the morning of surgery Do not apply any deodorants/lotions.   Remember to brush your teeth WITH YOUR REGULAR TOOTHPASTE.    If you received a COVID test during your pre-op visit, it is requested that you wear a mask when out in public, stay away from anyone that may not be feeling well, and notify your surgeon if you develop symptoms. If you have been in contact with anyone that has tested positive in the last 10 days, please notify your surgeon.    Please read over the following fact sheets that you were given.

## 2021-12-10 ENCOUNTER — Encounter (HOSPITAL_COMMUNITY): Payer: Self-pay

## 2021-12-10 ENCOUNTER — Encounter (HOSPITAL_COMMUNITY)
Admission: RE | Admit: 2021-12-10 | Discharge: 2021-12-10 | Disposition: A | Discharge status: Home / Self Care | Payer: PPO | Source: Ambulatory Visit | Attending: Internal Medicine | Admitting: Internal Medicine

## 2021-12-10 ENCOUNTER — Ambulatory Visit (HOSPITAL_COMMUNITY): Payer: PPO | Admitting: Dentistry

## 2021-12-10 ENCOUNTER — Other Ambulatory Visit: Payer: Self-pay

## 2021-12-10 ENCOUNTER — Ambulatory Visit (HOSPITAL_COMMUNITY)
Admission: RE | Admit: 2021-12-10 | Discharge: 2021-12-10 | Disposition: A | Discharge status: Home / Self Care | Payer: PPO | Source: Ambulatory Visit | Attending: Internal Medicine | Admitting: Internal Medicine

## 2021-12-10 VITALS — BP 114/72 | HR 78 | Temp 98.3°F | Resp 17 | Ht 64.0 in | Wt 153.8 lb

## 2021-12-10 DIAGNOSIS — Z20822 Contact with and (suspected) exposure to covid-19: Secondary | ICD-10-CM | POA: Insufficient documentation

## 2021-12-10 DIAGNOSIS — Z01818 Encounter for other preprocedural examination: Secondary | ICD-10-CM | POA: Insufficient documentation

## 2021-12-10 DIAGNOSIS — I350 Nonrheumatic aortic (valve) stenosis: Secondary | ICD-10-CM | POA: Insufficient documentation

## 2021-12-10 DIAGNOSIS — J9811 Atelectasis: Secondary | ICD-10-CM | POA: Diagnosis not present

## 2021-12-10 HISTORY — DX: Nausea with vomiting, unspecified: R11.2

## 2021-12-10 HISTORY — DX: Other complications of anesthesia, initial encounter: T88.59XA

## 2021-12-10 HISTORY — DX: Other specified postprocedural states: Z98.890

## 2021-12-10 LAB — URINALYSIS, ROUTINE W REFLEX MICROSCOPIC
Bilirubin Urine: NEGATIVE
Glucose, UA: NEGATIVE mg/dL
Ketones, ur: NEGATIVE mg/dL
Leukocytes,Ua: NEGATIVE
Nitrite: NEGATIVE
Protein, ur: NEGATIVE mg/dL
Specific Gravity, Urine: 1.023 (ref 1.005–1.030)
pH: 5 (ref 5.0–8.0)

## 2021-12-10 LAB — CBC
HCT: 41.1 % (ref 36.0–46.0)
Hemoglobin: 13.9 g/dL (ref 12.0–15.0)
MCH: 30.8 pg (ref 26.0–34.0)
MCHC: 33.8 g/dL (ref 30.0–36.0)
MCV: 91.1 fL (ref 80.0–100.0)
Platelets: 295 10*3/uL (ref 150–400)
RBC: 4.51 MIL/uL (ref 3.87–5.11)
RDW: 12.6 % (ref 11.5–15.5)
WBC: 9.1 10*3/uL (ref 4.0–10.5)
nRBC: 0 % (ref 0.0–0.2)

## 2021-12-10 LAB — COMPREHENSIVE METABOLIC PANEL
ALT: 17 U/L (ref 0–44)
AST: 21 U/L (ref 15–41)
Albumin: 3.6 g/dL (ref 3.5–5.0)
Alkaline Phosphatase: 56 U/L (ref 38–126)
Anion gap: 6 (ref 5–15)
BUN: 12 mg/dL (ref 8–23)
CO2: 30 mmol/L (ref 22–32)
Calcium: 9.1 mg/dL (ref 8.9–10.3)
Chloride: 105 mmol/L (ref 98–111)
Creatinine, Ser: 0.88 mg/dL (ref 0.44–1.00)
GFR, Estimated: >60 mL/min (ref >60)
Glucose, Bld: 103 mg/dL — ABNORMAL HIGH (ref 70–99)
Potassium: 3.8 mmol/L (ref 3.5–5.1)
Sodium: 141 mmol/L (ref 135–145)
Total Bilirubin: 1.9 mg/dL — ABNORMAL HIGH (ref 0.3–1.2)
Total Protein: 7.1 g/dL (ref 6.5–8.1)

## 2021-12-10 LAB — PROTIME-INR
INR: 1.1 (ref 0.8–1.2)
Prothrombin Time: 14.2 seconds (ref 11.4–15.2)

## 2021-12-10 LAB — TYPE AND SCREEN
ABO/RH(D): A NEG
Antibody Screen: NEGATIVE

## 2021-12-10 LAB — SURGICAL PCR SCREEN
MRSA, PCR: NEGATIVE
Staphylococcus aureus: NEGATIVE

## 2021-12-10 LAB — SARS CORONAVIRUS 2 (TAT 6-24 HRS): SARS Coronavirus 2: NEGATIVE

## 2021-12-10 NOTE — Progress Notes (Signed)
PCP - Evelina Dun, FNP in [place] - Dr. Percival Spanish  PPM/ICD - Denies  Chest x-ray - 12/10/21 EKG - 9/22/*23 Stress Test - 04/24/20 ECHO - 05/25/21 Cardiac Cath -09/22/21   Sleep Study - Denies  Blood Thinner Instructions: Eliquis, Theodosia Quay instructed to stop on 12/09/21 Aspirin Instructions:Denies  COVID TEST- 12/10/21   Anesthesia review: Yes cardiac history  Patient denies shortness of breath, fever, cough and chest pain at PAT appointment   All instructions explained to the patient, with a verbal understanding of the material. Patient agrees to go over the instructions while at home for a better understanding.  The opportunity to ask questions was provided.

## 2021-12-13 ENCOUNTER — Ambulatory Visit (INDEPENDENT_AMBULATORY_CARE_PROVIDER_SITE_OTHER): Payer: PPO | Admitting: Dentistry

## 2021-12-13 ENCOUNTER — Encounter (HOSPITAL_COMMUNITY): Payer: Self-pay | Admitting: Dentistry

## 2021-12-13 VITALS — BP 113/72 | HR 67 | Temp 98.2°F

## 2021-12-13 DIAGNOSIS — K08199 Complete loss of teeth due to other specified cause, unspecified class: Secondary | ICD-10-CM

## 2021-12-13 MED ORDER — NOREPINEPHRINE 4 MG/250ML-% IV SOLN
0.0000 ug/min | INTRAVENOUS | Status: AC
  Administered 2021-12-14: 2 ug/min via INTRAVENOUS
  Filled 2021-12-13: qty 250

## 2021-12-13 MED ORDER — CEFAZOLIN SODIUM-DEXTROSE 2-4 GM/100ML-% IV SOLN
2.0000 g | INTRAVENOUS | Status: AC
  Administered 2021-12-14: 2 g via INTRAVENOUS
  Filled 2021-12-13: qty 100

## 2021-12-13 MED ORDER — POTASSIUM CHLORIDE 2 MEQ/ML IV SOLN
80.0000 meq | INTRAVENOUS | Status: DC
  Filled 2021-12-13: qty 40

## 2021-12-13 MED ORDER — DEXMEDETOMIDINE HCL IN NACL 400 MCG/100ML IV SOLN
0.1000 ug/kg/h | INTRAVENOUS | Status: AC
  Administered 2021-12-14: 34.92 ug via INTRAVENOUS
  Filled 2021-12-13: qty 100

## 2021-12-13 MED ORDER — MAGNESIUM SULFATE 50 % IJ SOLN
40.0000 meq | INTRAMUSCULAR | Status: DC
  Filled 2021-12-13: qty 9.85

## 2021-12-13 MED ORDER — HEPARIN 30,000 UNITS/1000 ML (OHS) CELLSAVER SOLUTION
Status: DC
  Filled 2021-12-13: qty 1000

## 2021-12-13 NOTE — Progress Notes (Signed)
[facility] Department of Dental Medicine     TODAY'S VISIT   POSTOP/FOLLOW-UP     ASSESSMENT: The patient continues to heal well and consistent with dental procedures performed.     PLAN/RECOMMENDATIONS: Return to the hospital dental clinic for comprehensive dental care.  Call to schedule an appointment after heart surgery and after you have been cleared for routine dental work by your medical team. Call if any questions or concerns arise before next visit.    Service Date:   12/13/2021  Patient Name:   Autumn Moyer Date of Birth:   Jul 23, 1940 Medical Record Number: [id number]  Referring Provider:           Lenna Sciara, MD   HISTORY OF PRESENT ILLNESS: Autumn Moyer presents today for a postoperative visit status post extractions of teeth numbers 3, 13, 19, 30 and 31 in the operating room on 12/02/21 as well as to re-cement PFM crown on tooth #29. Medical and dental history reviewed with the patient.  No changes were reported. Her aortic valve replacement is scheduled for tomorrow (9/26).   CHIEF COMPLAINT:   Here for a postoperative appointment. Patient with no complaints; she states that she did very well after her dental procedure and has no concerns.   Patient Active Problem List   Diagnosis Date Noted   Long term current use of anticoagulant therapy 09/28/2021   Caries 09/28/2021   Retained tooth root 09/28/2021   Teeth missing 09/28/2021   Phobia of dental procedure 09/28/2021   Chronic apical periodontitis 09/28/2021   Accretions on teeth 09/28/2021   Chronic periodontitis 09/28/2021   Torus palatinus 09/28/2021   Generalized gingival recession 09/28/2021   Defective dental restoration 09/28/2021   Excessive attrition of teeth limited to enamel 09/28/2021   Encounter for preoperative dental examination 09/28/2021   Clinical xerostomia 09/28/2021   SOB (shortness of breath) 03/22/2021   Large hiatal hernia 07/17/2020   Precordial chest  pain 04/14/2020   Atrial fibrillation ([place]) 02/10/2020   Aortic stenosis 10/09/2018   Prediabetes 06/29/2016   Obesity (BMI 30.0-34.9) 11/27/2015   Allergic rhinitis 05/22/2015   Iron deficiency anemia 05/22/2015   Murmur 09/05/2014   Essential hypertension, benign 11/15/2013   Esophageal reflux 11/15/2013   Hypothyroid 06/11/2010   Diverticular disease 06/11/2010   Post-menopausal 06/11/2010   Past Medical History:  Diagnosis Date   Atrial fibrillation ([place])    Complication of anesthesia    Dyspnea    Hypertension    PONV (postoperative nausea and vomiting)    Squamous cell carcinoma of skin 01/16/2018   in situ-right knee post (txpbx)   Thyroid disease    Past Surgical History:  Procedure Laterality Date   ABDOMINAL AORTOGRAM N/A 09/22/2021   Procedure: ABDOMINAL AORTOGRAM;  Surgeon: Early Osmond, MD;  Location: [facility];  Service: Cardiovascular;  Laterality: N/A;   CARDIAC CATHETERIZATION     EYE SURGERY     MULTIPLE EXTRACTIONS WITH ALVEOLOPLASTY N/A 12/02/2021   Procedure: MULTIPLE EXTRACTION WITH ALVEOLOPLASTY;  Surgeon: Charlaine Dalton, DMD;  Location: [facility];  Service: Dentistry;  Laterality: N/A;   RIGHT HEART CATH AND CORONARY/GRAFT ANGIOGRAPHY N/A 09/22/2021   Procedure: RIGHT HEART CATH AND CORONARY/GRAFT ANGIOGRAPHY;  Surgeon: Early Osmond, MD;  Location: [facility];  Service: Cardiovascular;  Laterality: N/A;   TOE SURGERY     TUBAL LIGATION     Current Outpatient Medications  Medication Sig Dispense Refill   acetaminophen (TYLENOL) 500  MG tablet Take 1,000 mg by mouth daily.     apixaban (ELIQUIS) 5 MG TABS tablet Take 1 tablet (5 mg total) by mouth 2 (two) times daily. 180 tablet 1   benazepril-hydrochlorthiazide (LOTENSIN HCT) 20-25 MG tablet Take 1 tablet by mouth daily. 90 tablet 1   digoxin (LANOXIN) 0.125 MG tablet Take 1 tablet (0.125 mg total) by mouth daily. 90 tablet 3   furosemide (LASIX) 20 MG tablet Take 20 mg by mouth  daily as needed for edema or fluid.     levocetirizine (XYZAL) 5 MG tablet Take 1 tablet (5 mg total) by mouth every evening. 90 tablet 1   levothyroxine (SYNTHROID) 88 MCG tablet Take 1 tablet (88 mcg total) by mouth daily. (Patient taking differently: Take 88 mcg by mouth daily before breakfast.) 90 tablet 3   metoprolol tartrate (LOPRESSOR) 25 MG tablet Take 1.5 tablets (37.5 mg total) by mouth 2 (two) times daily. 270 tablet 3   mometasone (ELOCON) 0.1 % cream Apply 1 application topically daily. (Patient taking differently: Apply 1 application  topically daily as needed (in ears).) 45 g 2   Multiple Vitamin (MULTIVITAMIN) capsule Take 1 capsule by mouth daily.     pantoprazole (PROTONIX) 40 MG tablet Take 1 tablet (40 mg total) by mouth daily. 90 tablet 1   Polyvinyl Alcohol-Povidone PF (REFRESH) 1.4-0.6 % SOLN Place 2 drops into both eyes every morning.     tacrolimus (PROTOPIC) 0.1 % ointment Apply topically in the morning and at bedtime. (Patient taking differently: Apply 1 Application topically daily. Eye lids) 30 g 4   No current facility-administered medications for this visit.   Allergies  Allergen Reactions   Noroxin [Norfloxacin] Nausea And Vomiting   Augmentin [Amoxicillin-Pot Clavulanate] Other (See Comments)    Gums swelling, lips numb    LABS: Lab Results  Component Value Date   WBC 9.1 12/10/2021   HGB 13.9 12/10/2021   HCT 41.1 12/10/2021   MCV 91.1 12/10/2021   PLT 295 12/10/2021   BMET    Component Value Date/Time   NA 141 12/10/2021 1421   NA 143 09/10/2021 1644   K 3.8 12/10/2021 1421   CL 105 12/10/2021 1421   CO2 30 12/10/2021 1421   GLUCOSE 103 (H) 12/10/2021 1421   BUN 12 12/10/2021 1421   BUN 18 09/10/2021 1644   CREATININE 0.88 12/10/2021 1421   CALCIUM 9.1 12/10/2021 1421   EGFR 69 09/10/2021 1644   GFRNONAA >60 12/10/2021 1421    Lab Results  Component Value Date   INR 1.1 12/10/2021   No results found for: "PTT"   VITALS: BP 113/72  (BP Location: Right Arm, Patient Position: Sitting, Cuff Size: Normal)   Pulse 67   Temp 98.2 F (36.8 C) (Oral)    EXAM: Extraction sites appear to be healing WNL.  No signs of wound dehiscence or infection evident upon examination.  No sutures remain in-tact. #29 missing PFM crown; crown came off during elevation while extracting tooth #30 in the operating room. Tooth #29 has no evidence of decay or periapical pathology and is asymptomatic per patient.   ASSESSMENT:   Postoperative course is consistent with dental procedures performed. #29 missing full coverage crown   PROCEDURES: Tooth #29 cementation (re-cement) of existing PFM crown.  The patient was given a chlorhexidine gluconate rinse for 30 seconds. Irrigation of extraction sites performed with syringe and sterile saline. Cotton roll isolation.  #29 PFM crown tried in. Margins were verified using explorer and  to check proper seating/positioning of crown.  Tooth #29 was air dried and cavity conditioner was etched onto tooth and intaglio surface of crown and then lightly air dried until no movement of the cavity conditioner was seen.  Fuji II glass ionomer material was placed in intaglio surface of crown and slowly seated onto tooth.  Margins were then verified for proper seating and had patient lightly bite onto a cotton roll until cement material began to set.  Excess cement was removed carefully using hand instruments and then light-cured around the margins of the crown.  Cotton roll was removed.   Margins, occlusion and contacts were verified and adjusted as needed.  PFM crown was polished where adjustments were made.   PLAN AND RECOMMENDATIONS: Follow-up as needed.   Establish care at an outside dental office for routine dental care including replacement of missing teeth as needed, cleanings/periodontal therapy and exams.  We had discussed seeing the patient in our hospital clinic for comprehensive care following her heart  surgery and after her doctors have cleared her for routine dental work (as well as for restoration on tooth #14).  The patient states that she is still interested in returning for dental care and will call after she has recovered from heart surgery.  Recommend that the patient discuss plans to return to the dentist for non-urgent treatment with their medical team to ensure they are medically optimized and for new antibiotic prophylaxis recommendations. Call if any questions or concerns arise.  All questions and concerns were invited and addressed.  The patient tolerated today's visit well and departed in stable condition.  -Sandi Mariscal, DMD

## 2021-12-13 NOTE — H&P (Signed)
Patient ID: Autumn Moyer, female   DOB: 11-Dec-1940, 81 y.o.   MRN: [id number]  [facility]         [address]       [place] [address]             [phone]                      CARDIOTHORACIC SURGERY CONSULTATION REPORT   PCP is Sharion Balloon, FNP Referring Provider is Lenna Sciara, MD Primary Cardiologist is Minus Breeding, MD   Reason for consultation:  Severe aortic stenosis   HPI:   The patient is a 81 year old woman with a history of hypertension, paroxysmal atrial fibrillation on anticoagulation, hypothyroidism, and severe aortic stenosis who was referred for consideration of transcatheter aortic valve replacement.  She has been followed with periodic echocardiograms by Dr. Percival Spanish since 2016.  Her mean aortic valve gradient was 27 mmHg in January 2022.  The mean gradient had increased to 30 mmHg on her most recent echo in March of this year.  The peak gradient was 49 mmHg with a valve area by VTI of 0.5 cm.  There was mild to moderate aortic regurgitation.  Left ventricular ejection fraction was 55 to 60%.  There was trivial mitral regurgitation.  She has developed shortness of breath and fatigue with exertion.  She reports some shortness of breath for several years but has gotten worse over the past 6 months.  She said that digoxin was added to her medication regimen for atrial fibrillation and has made her feel better.  She has had shortness of breath and fatigue with doing her daily activities and walking upstairs.  She denies any dizziness or syncope.  She has had no chest discomfort.  She denies peripheral edema and orthopnea.  She had not seen a dentist in years and was seen by Dr. Benson Norway and scheduled for dental extractions on 11/25/2021.   She is married and lives with her husband in [place].  She has 2 adult children.       Past Medical History:  Diagnosis Date   Atrial  fibrillation ([place])     Hypertension     Squamous cell carcinoma of skin 01/16/2018    in situ-right knee post (txpbx)   Thyroid disease             Past Surgical History:  Procedure Laterality Date   ABDOMINAL AORTOGRAM N/A 09/22/2021    Procedure: ABDOMINAL AORTOGRAM;  Surgeon: Early Osmond, MD;  Location: [facility];  Service: Cardiovascular;  Laterality: N/A;   EYE SURGERY       RIGHT HEART CATH AND CORONARY/GRAFT ANGIOGRAPHY N/A 09/22/2021    Procedure: RIGHT HEART CATH AND CORONARY/GRAFT ANGIOGRAPHY;  Surgeon: Early Osmond, MD;  Location: [facility];  Service: Cardiovascular;  Laterality: N/A;   TOE SURGERY       TUBAL LIGATION               Family History  Problem Relation Age of Onset   Hypertension Mother     Hyperlipidemia Mother     Heart failure Mother          Age 27   Hyperlipidemia Father     Hypertension Father     Heart disease Father          died of MI after developoing cancer.  Lung cancer Father     Hypertension Sister     Breast cancer Daughter     Breast cancer Paternal Aunt        Social History         Socioeconomic History   Marital status: Married      Spouse name: Danny   Number of children: 2   Years of education: Not on file   Highest education level: Not on file  Occupational History   Not on file  Tobacco Use   Smoking status: Never      Passive exposure: Past   Smokeless tobacco: Never  Vaping Use   Vaping Use: Never used  Substance and Sexual Activity   Alcohol use: No      Alcohol/week: 0.0 standard drinks of alcohol   Drug use: No   Sexual activity: Yes      Partners: Female  Other Topics Concern   Not on file  Social History Narrative    Lives with husband.  Married x 56 years in 2022.    2 daughters    5 grand children    1 great grandchild.    Social Determinants of Health        Financial Resource Strain: Low Risk  (02/03/2021)    Overall Financial Resource Strain (CARDIA)      Difficulty of Paying Living Expenses: Not hard at all  Food Insecurity: No Food Insecurity (02/03/2021)    Hunger Vital Sign     Worried About Running Out of Food in the Last Year: Never true     Ran Out of Food in the Last Year: Never true  Transportation Needs: No Transportation Needs (02/03/2021)    PRAPARE - Armed forces logistics/support/administrative officer (Medical): No     Lack of Transportation (Non-Medical): No  Physical Activity: Insufficiently Active (02/03/2021)    Exercise Vital Sign     Days of Exercise per Week: 3 days     Minutes of Exercise per Session: 20 min  Stress: No Stress Concern Present (02/03/2021)    [place]     Feeling of Stress : Not at all  Social Connections: [place] (02/03/2021)    Social Connection and Isolation Panel [NHANES]     Frequency of Communication with Friends and Family: More than three times a week     Frequency of Social Gatherings with Friends and Family: More than three times a week     Attends Religious Services: More than 4 times per year     Active Member of [employer] or Organizations: Yes     Attends Archivist Meetings: More than 4 times per year     Marital Status: Married  Human resources officer Violence: Not At Risk (02/03/2021)    Humiliation, Afraid, Rape, and Kick questionnaire     Fear of Current or Ex-Partner: No     Emotionally Abused: No     Physically Abused: No     Sexually Abused: No             Prior to Admission medications   Medication Sig Start Date End Date Taking? Authorizing Provider  acetaminophen (TYLENOL) 325 MG tablet Take 650 mg by mouth every 6 (six) hours as needed (Arthritis pain).     Yes [provider]  apixaban (ELIQUIS) 5 MG TABS tablet Take 1 tablet (5 mg total) by mouth 2 (two) times daily. 05/27/21   Yes Hawks,  Christy A, FNP  benazepril-hydrochlorthiazide (LOTENSIN HCT) 20-25 MG tablet Take 1 tablet by mouth  daily. 05/27/21   Yes Hawks, Christy A, FNP  digoxin (LANOXIN) 0.125 MG tablet Take 1 tablet (0.125 mg total) by mouth daily. 05/31/21   Yes Minus Breeding, MD  levocetirizine (XYZAL) 5 MG tablet Take 1 tablet (5 mg total) by mouth every evening. 05/27/21   Yes Hawks, Christy A, FNP  levothyroxine (SYNTHROID) 88 MCG tablet Take 1 tablet (88 mcg total) by mouth daily. 05/27/21 05/27/22 Yes Hawks, Christy A, FNP  metoprolol tartrate (LOPRESSOR) 25 MG tablet Take 1.5 tablets (37.5 mg total) by mouth 2 (two) times daily. 07/26/21   Yes Hochrein, Jeneen Rinks, MD  mometasone (ELOCON) 0.1 % cream Apply 1 application topically daily. Patient taking differently: Apply 1 application  topically daily as needed (in ears). 02/10/20   Yes Hawks, Christy A, FNP  Multiple Vitamin (MULTIVITAMIN) capsule Take 1 capsule by mouth daily.     Yes [provider]  Omega-3 Fatty Acids (FISH OIL) 500 MG CAPS Take 1,000 mg by mouth daily.     Yes [provider]  pantoprazole (PROTONIX) 40 MG tablet Take 1 tablet (40 mg total) by mouth daily. 05/27/21   Yes Hawks, Christy A, FNP  Polyvinyl Alcohol-Povidone PF (REFRESH) 1.4-0.6 % SOLN Place 2 drops into both eyes every morning.     Yes [provider]  tacrolimus (PROTOPIC) 0.1 % ointment Apply topically in the morning and at bedtime. Patient taking differently: Apply 1 Application topically in the morning and at bedtime. Eye lids 03/12/20   Yes Lavonna Monarch, MD  furosemide (LASIX) 20 MG tablet Take 1 tablet (20 mg total) by mouth daily as needed. Patient taking differently: Take 20 mg by mouth daily as needed for fluid or edema. 05/27/21 09/13/21   Sharion Balloon, FNP            Current Outpatient Medications  Medication Sig Dispense Refill   acetaminophen (TYLENOL) 325 MG tablet Take 650 mg by mouth every 6 (six) hours as needed (Arthritis pain).       apixaban (ELIQUIS) 5 MG TABS tablet Take 1 tablet (5 mg total) by mouth 2 (two) times daily. 180 tablet 1    benazepril-hydrochlorthiazide (LOTENSIN HCT) 20-25 MG tablet Take 1 tablet by mouth daily. 90 tablet 1   digoxin (LANOXIN) 0.125 MG tablet Take 1 tablet (0.125 mg total) by mouth daily. 90 tablet 3   levocetirizine (XYZAL) 5 MG tablet Take 1 tablet (5 mg total) by mouth every evening. 90 tablet 1   levothyroxine (SYNTHROID) 88 MCG tablet Take 1 tablet (88 mcg total) by mouth daily. 90 tablet 3   metoprolol tartrate (LOPRESSOR) 25 MG tablet Take 1.5 tablets (37.5 mg total) by mouth 2 (two) times daily. 270 tablet 3   mometasone (ELOCON) 0.1 % cream Apply 1 application topically daily. (Patient taking differently: Apply 1 application  topically daily as needed (in ears).) 45 g 2   Multiple Vitamin (MULTIVITAMIN) capsule Take 1 capsule by mouth daily.       Omega-3 Fatty Acids (FISH OIL) 500 MG CAPS Take 1,000 mg by mouth daily.       pantoprazole (PROTONIX) 40 MG tablet Take 1 tablet (40 mg total) by mouth daily. 90 tablet 1   Polyvinyl Alcohol-Povidone PF (REFRESH) 1.4-0.6 % SOLN Place 2 drops into both eyes every morning.       tacrolimus (PROTOPIC) 0.1 % ointment Apply topically in the morning and at bedtime. (  Patient taking differently: Apply 1 Application topically in the morning and at bedtime. Eye lids) 30 g 4   furosemide (LASIX) 20 MG tablet Take 1 tablet (20 mg total) by mouth daily as needed. (Patient taking differently: Take 20 mg by mouth daily as needed for fluid or edema.) 90 tablet 1    No current facility-administered medications for this visit.           Allergies  Allergen Reactions   Noroxin [Norfloxacin] Nausea And Vomiting   Augmentin [Amoxicillin-Pot Clavulanate] Other (See Comments)      Gums swelling, lips numb          Review of Systems:               General:                      normal appetite, + decreased energy, no weight gain, no weight loss, no fever             Cardiac:                       no chest pain with exertion, no chest pain at rest, +SOB with  moderate exertion, no resting SOB, no PND, no orthopnea, + palpitations, + arrhythmia, + atrial fibrillation, no LE edema, no dizzy spells, no syncope             Respiratory:                 + exertional shortness of breath, no home oxygen, no productive cough, no dry cough, no bronchitis, no wheezing, no hemoptysis, no asthma, no pain with inspiration or cough, no sleep apnea, no CPAP at night             GI:                               no difficulty swallowing, + reflux, no frequent heartburn, + hiatal hernia, no abdominal pain, no constipation, no diarrhea, no hematochezia, no hematemesis, no melena             GU:                              no dysuria,  no frequency, no urinary tract infection, no hematuria,  no kidney stones, no kidney disease             Vascular:                     no pain suggestive of claudication, no pain in feet, + leg cramps, no varicose veins, no DVT, no non-healing foot ulcer             Neuro:                         no stroke, no TIA's, no seizures, no headaches, no temporary blindness one eye,  no slurred speech, no peripheral neuropathy, no chronic pain, no instability of gait, no memory/cognitive dysfunction             Musculoskeletal:         + arthritis, no joint swelling, no myalgias, no difficulty walking, normal mobility              Skin:  no rash, no itching, no skin infections, no pressure sores or ulcerations             Psych:                         no anxiety, no depression, no nervousness, no unusual recent stress             Eyes:                           no blurry vision, + floaters, no recent vision changes, no glasses or contacts             ENT:                            no hearing loss, no loose or painful teeth, no dentures, last saw dentist 09/27/21. Dental extractions planned 11/25/21             Hematologic:               no easy bruising, no abnormal bleeding, no clotting disorder, no frequent epistaxis              Endocrine:                   no diabetes, does not check CBG's at home                            Physical Exam:               BP (!) 153/89 (BP Location: Left Arm, Patient Position: Sitting)   Pulse 74   Resp 18   Ht '5\' 4"'$  (1.626 m)   SpO2 98% Comment: RA  BMI 27.98 kg/m              General:                      Elderly,  well-appearing             HEENT:                       Unremarkable, NCAT, PERLA, EOMI             Neck:                           no JVD, no bruits, no adenopathy              Chest:                          clear to auscultation, symmetrical breath sounds, no wheezes, no rhonchi              CV:                              RRR, 3/6 systolic murmur RSB, no diastolic murmur             Abdomen:                    soft, non-tender, no masses              Extremities:  warm, well-perfused, pulses palpable at ankles, no lower extremity edema             Rectal/GU                   Deferred             Neuro:                         Grossly non-focal and symmetrical throughout             Skin:                            Clean and dry, no rashes, no breakdown   Diagnostic Tests:   ECHOCARDIOGRAM REPORT         Patient Name:   OLUWASEUN BRUYERE Date of Exam: 05/25/2021  Medical Rec #:  [id number]      Height:       64.0 in  Accession #:    [id number]     Weight:       166.0 lb  Date of Birth:  [birth year]       BSA:          1.807 m  Patient Age:    79 years       BP:           118/80 mmHg  Patient Gender: F              HR:           107 bpm.  Exam Location:  Outpatient   Procedure: 2D Echo, Cardiac Doppler and Color Doppler   Indications:    R06.9 DOE; R60.0 Lower extremity edema; I48.91*  Unspeicified                  atrial fibrillation; I35.2 Nonrheumatic aortic (valve)  stenosis                  with insufficiency     History:        Patient has prior history of Echocardiogram examinations,  most                  recent 04/02/2020. Aortic  Valve Disease, Arrythmias:Atrial                  Fibrillation, Signs/Symptoms:Dyspnea and Edema; Risk                  Factors:Hypertension and Non-Smoker. Patient denies chest  pain                  but is having increasing DOE with mild bilateral leg  edema.     Sonographer:    Salvadore Dom RVT, RDCS (AE), RDMS  Referring Phys: Manuel Garcia      Sonographer Comments: Suboptimal parasternal window. Image acquisition  challenging due to patient body habitus.  IMPRESSIONS     1. In Afib with RVR during study. Left ventricular ejection fraction, by  estimation, is 55 to 60%. The left ventricle has normal function. The left  ventricle has no regional wall motion abnormalities. There is mild left  ventricular hypertrophy. Left  ventricular diastolic parameters are indeterminate.   2. Right ventricular systolic function is normal. The right ventricular  size is moderately enlarged. There is normal pulmonary artery systolic  pressure. The estimated right ventricular systolic pressure is 44.3 mmHg.  3. Left atrial size was severely dilated.   4. Right atrial size was severely dilated.   5. The mitral valve is degenerative. Trivial mitral valve regurgitation.  Moderate mitral annular calcification.   6. Tricuspid valve regurgitation is mild to moderate.   7. The inferior vena cava is normal in size with greater than 50%  respiratory variability, suggesting right atrial pressure of 3 mmHg.   8. The aortic valve was not well visualized. There is severe  calcification of the aortic valve. Aortic valve regurgitation is mild to  moderate. Severe aortic valve stenosis. Vmax 4.0 m/s, MG 38 mmHg, AVA 0.5  cm^2, DI 0.25   Comparison(s): EF 60%, mild AI 260mec, AS mean gradient 26.8 mmHG, peak  47.847mg.   FINDINGS   Left Ventricle: Left ventricular ejection fraction, by estimation, is 55  to 60%. The left ventricle has normal function. The left ventricle has no  regional wall  motion abnormalities. The left ventricular internal cavity  size was small. There is mild  left ventricular hypertrophy. Left ventricular diastolic parameters are  indeterminate.   Right Ventricle: The right ventricular size is moderately enlarged. Right  vetricular wall thickness was not well visualized. Right ventricular  systolic function is normal. There is normal pulmonary artery systolic  pressure. The tricuspid regurgitant  velocity is 2.75 m/s, and with an assumed right atrial pressure of 3 mmHg,  the estimated right ventricular systolic pressure is 3396.2mHg.   Left Atrium: Left atrial size was severely dilated.   Right Atrium: Right atrial size was severely dilated.   Pericardium: There is no evidence of pericardial effusion.   Mitral Valve: The mitral valve is degenerative in appearance. Moderate  mitral annular calcification. Trivial mitral valve regurgitation.   Tricuspid Valve: The tricuspid valve is normal in structure. Tricuspid  valve regurgitation is mild to moderate.   Aortic Valve: The aortic valve was not well visualized. There is severe  calcifcation of the aortic valve. Aortic valve regurgitation is mild to  moderate. Aortic regurgitation PHT measures 522 msec. Severe aortic  stenosis is present. Aortic valve mean  gradient measures 30.2 mmHg. Aortic valve peak gradient measures 49.2  mmHg. Aortic valve area, by VTI measures 0.50 cm.   Pulmonic Valve: The pulmonic valve was not well visualized. Pulmonic valve  regurgitation is not visualized.   Aorta: The aortic root and ascending aorta are structurally normal, with  no evidence of dilitation.   Venous: The inferior vena cava is normal in size with greater than 50%  respiratory variability, suggesting right atrial pressure of 3 mmHg.   IAS/Shunts: The interatrial septum was not well visualized.      LEFT VENTRICLE  PLAX 2D  LVOT diam:     1.50 cm   Diastology  LV SV:         34        LV e'  medial:    7.07 cm/s  LV SV Index:   19        LV E/e' medial:  18.2  LVOT Area:     1.77 cm  LV e' lateral:   11.10 cm/s                           LV E/e' lateral: 11.6                              3D Volume EF:  3D EF:        58 %                           LV EDV:       79 ml                           LV ESV:       33 ml                           LV SV:        46 ml   RIGHT VENTRICLE  RV S prime:     13.40 cm/s  TAPSE (M-mode): 2.0 cm   LEFT ATRIUM             Index        RIGHT ATRIUM           Index  LA diam:        4.70 cm 2.60 cm/m   RA Area:     26.10 cm  LA Vol (A2C):   93.2 ml 51.57 ml/m  RA Volume:   101.00 ml 55.88 ml/m  LA Vol (A4C):   91.4 ml 50.57 ml/m  LA Biplane Vol: 93.5 ml 51.73 ml/m   AORTIC VALVE                     PULMONIC VALVE  AV Area (Vmax):    0.49 cm      PV Vmax:       0.96 m/s  AV Area (Vmean):   0.46 cm      PV Peak grad:  3.7 mmHg  AV Area (VTI):     0.50 cm  AV Vmax:           350.80 cm/s  AV Vmean:          257.250 cm/s  AV VTI:            0.685 m  AV Peak Grad:      49.2 mmHg  AV Mean Grad:      30.2 mmHg  LVOT Vmax:         96.43 cm/s  LVOT Vmean:        67.100 cm/s  LVOT VTI:          0.194 m  LVOT/AV VTI ratio: 0.28  AI PHT:            522 msec  AR Vena Contracta: 0.31 cm     AORTA  Ao Root diam: 3.10 cm  Ao Asc diam:  3.60 cm  Ao Arch diam: 3.2 cm   MITRAL VALVE                TRICUSPID VALVE  MV Area (PHT): 5.54 cm     TR Peak grad:   30.2 mmHg  MV Decel Time: 137 msec     TR Vmax:        275.00 cm/s  MV E velocity: 129.00 cm/s                              SHUNTS                              Systemic VTI:  0.19 m                              Systemic Diam: 1.50 cm   Oswaldo Milian MD  Electronically signed by Oswaldo Milian MD  Signature Date/Time: 05/25/2021/10:06:38 PM         Final       Physicians   Panel Physicians Referring Physician Case Authorizing Physician   Early Osmond, MD (Primary)        Procedures   ABDOMINAL AORTOGRAM  RIGHT HEART CATH AND CORONARY/GRAFT ANGIOGRAPHY    Conclusion       Prox LAD lesion is 20% stenosed.   1.  Mild obstructive coronary artery disease 2.  Cardiac output of 9.8 L/min and cardiac index of 5.4 L/min/m with mean RA pressure of 8 mmHg, mean wedge pressure of 16 mmHg, and mean PA pressure of 26 mmHg. 3.  Capacious iliofemoral vessels bilaterally with femoral bifurcations well below the femoral heads.   Recommendation: Continue evaluation for aortic valve intervention.   Indications   Nonrheumatic aortic valve stenosis [I35.0 (ICD-10-CM)]    Clinical Presentation   CHF/Shock Congestive heart failure not present. No shock present.    Procedural Details   Technical Details The patient is a 81 year old female with a history of severe symptomatic aortic stenosis, paroxysmal atrial fibrillation on anticoagulation, hypertension, hypothyroidism, and chronic kidney disease was evaluated in the outpatient setting due to ongoing dyspnea.  She is referred for preprocedural assessment prior to aortic valve intervention.  After obtaining consent the patient was brought to the cardiac catheterization laboratory prepped draped sterile fashion ultrasound was used to gain access to the right radial artery and a 6 French glide sheath placed there.  '5mg'$  of verapamil and 5000 units of heparin were administered through the sheath.  A previously placed antecubital IV was exchanged for a 5 French femoral glide sheath.  Right heart catheterization, coronary angiography, and aortography study was then performed.  At the conclusion of the procedure manual pressure was applied to the antecubital site and a TR band was placed on the radial site.   Estimated blood loss <50 mL.   During this procedure medications were administered to achieve and maintain moderate conscious sedation while the patient's heart rate, blood pressure,  and oxygen saturation were continuously monitored and I was present face-to-face 100% of this time.    Medications (Filter: Administrations occurring from 1049 to 1158 on 09/22/21)  important  Continuous medications are totaled by the amount administered until 09/22/21 1158.    Heparin (Porcine) in NaCl 1000-0.9 UT/500ML-% SOLN (mL) Total volume:  1,000 mL  Date/Time Rate/Dose/Volume Action    09/22/21 1052 500 mL Given    1053 500 mL Given      fentaNYL (SUBLIMAZE) injection (mcg) Total dose:  25 mcg  Date/Time Rate/Dose/Volume Action    09/22/21 1103 25 mcg Given      midazolam (VERSED) injection (mg) Total dose:  1 mg  Date/Time Rate/Dose/Volume Action    09/22/21 1103 1 mg Given      lidocaine (PF) (XYLOCAINE) 1 % injection (mL) Total volume:  6 mL  Date/Time Rate/Dose/Volume Action    09/22/21 1106 6 mL Given      Radial Cocktail/Verapamil only (mL) Total volume:  10 mL  Date/Time Rate/Dose/Volume Action    09/22/21 1109 10 mL Given      heparin sodium (porcine) injection (Units) Total dose:  5,000 Units  Date/Time  Rate/Dose/Volume Action    09/22/21 1109 5,000 Units Given      iohexol (OMNIPAQUE) 350 MG/ML injection (mL) Total volume:  90 mL  Date/Time Rate/Dose/Volume Action    09/22/21 1146 90 mL Given      Sedation Time   Sedation Time Physician-1: 40 minutes 3 seconds Contrast   Medication Name Total Dose  iohexol (OMNIPAQUE) 350 MG/ML injection 90 mL    Radiation/Fluoro   Fluoro time: 10 (min) DAP: 54098 (mGycm2) Cumulative Air Kerma: 119 (mGy) Complications      Complications documented before study signed (09/22/2021 14:78 AM)     No complications were associated with this study.  Documented by Bard Herbert, RN - 09/22/2021 11:48 AM      Wound Indication   Wound Assessment No wound present.    Coronary Findings   Diagnostic Dominance: Right Left Anterior Descending  Prox LAD lesion is 20% stenosed.    Intervention    No  interventions have been documented.    Peripheral Vascular Findings   Diagnostic   No diagnostic findings have been documented. Intervention   No interventions have been documented. Coronary Diagrams   Diagnostic Dominance: Right  Intervention   Peripheral Vascular Diagram   Diagnostic    Intervention   Implants      No implant documentation for this case.    [employer] Images    Show images for CARDIAC CATHETERIZATION Images on Long Term Storage    Show images for Rasheen, Schewe to Procedure Log   Procedure Log    Hemo Data   Flowsheet Row Most Recent Value  Fick Cardiac Output 9.81 L/min  Fick Cardiac Output Index 5.47 (L/min)/BSA  RA A Wave 11 mmHg  RA V Wave 10 mmHg  RA Mean 8 mmHg  RV Systolic Pressure 37 mmHg  RV Diastolic Pressure 2 mmHg  RV EDP 8 mmHg  PA Systolic Pressure 40 mmHg  PA Diastolic Pressure 19 mmHg  PA Mean 26 mmHg  PW A Wave 17 mmHg  PW V Wave 21 mmHg  PW Mean 16 mmHg  AO Systolic Pressure 295 mmHg  AO Diastolic Pressure 59 mmHg  AO Mean 86 mmHg  QP/QS 1  TPVR Index 4.75 HRUI  TSVR Index 15.73 HRUI  PVR SVR Ratio 0.13  TPVR/TSVR Ratio 0.3      ADDENDUM REPORT: 10/04/2021 05:57   CLINICAL DATA:  Severe Aortic Stenosis.   EXAM: Cardiac TAVR CT   TECHNIQUE: A non-contrast, gated CT scan was obtained with axial slices of 3 mm through the heart for aortic valve calcium scoring. A 110 kV retrospective, gated, contrast cardiac scan was obtained. Gantry rotation speed was 250 msecs and collimation was 0.6 mm. Nitroglycerin was not given. The 3D data set was reconstructed in 5% intervals of the 0-95% of the R-R cycle. Systolic and diastolic phases were analyzed on a dedicated workstation using MPR, MIP, and VRT modes. The patient received 100 cc of contrast.   FINDINGS: Image quality: Excellent.   Noise artifact is: Limited.   Valve Morphology: Tricuspid aortic valve with diffuse severe calcifications and severe  thickening. There is severely restricted leaflet motion. 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   Aortic Valve Calcium score: 2205   Aortic annular dimension:   Phase assessed: 35%   Annular area: 413 mm2   Annular perimeter: 73.1 mm   Max diameter: 24.7 mm   Min diameter: 21.1 mm   Annular and subannular calcification: Moderate protruding calcification under  the [place] that extends into the LVOT. Minimal calcification under the RCC.   Membranous septum length: 13.4 mm   Optimal coplanar projection: LAO 12 CAU 9   Coronary Artery Height above Annulus:   Left Main: 17.2 mm   Right Coronary: 17.6 mm   Sinus of Valsalva Measurements:   Non-coronary: 30 mm   Right-coronary: 29 mm   Left-coronary: 30 mm   Sinus of Valsalva Height:   Non-coronary: 25.4 mm   Right-coronary: 23.2 mm   Left-coronary: 17.2 mm   Sinotubular Junction: 27 mm   Ascending Thoracic Aorta: 33 mm   Coronary Arteries: Normal coronary origin. High take off of the LCA above the LCC noted. Right dominance. The study was performed without use of NTG and is insufficient for plaque evaluation. Please refer to recent cardiac catheterization for coronary assessment.   Cardiac Morphology:   Right Atrium: Right atrial size is within normal limits.   Right Ventricle: The right ventricular cavity is within normal limits.   Left Atrium: Left atrial size is normal in size with no left atrial appendage filling defect.   Left Ventricle: The ventricular cavity size is within normal limits.   Pulmonary arteries: Normal in size without proximal filling defect.   Pulmonary veins: Normal pulmonary venous drainage.   Pericardium: Normal thickness with no significant effusion or calcium present.   Mitral Valve: The mitral valve is normal structure without significant calcification.   Extra-cardiac findings: See attached radiology report for non-cardiac structures.   IMPRESSION: 1.  Annular measurements appropriate for 23 mm S3 TAVR (413 mm2). Sizing is in between a 26/29 Evolut valve.   2. Moderate protruding calcification under the [place] that extends into the LVOT. Minimal calcification under the RCC.   3. Sufficient coronary to annulus distance. High take off of the LCA above the LCC noted.   4. Optimal Fluoroscopic Angle for Delivery: LAO 12 CAU 9   5. Very large hiatal hernia with displacement of the thoracic aorta into the R chest. Tortuous course noted.   [place] T. Audie Box, MD     Electronically Signed   By: Eleonore Chiquito M.D.   On: 10/04/2021 05:57   Narrative & Impression  CLINICAL DATA:  Aortic valve replacement preop evaluation   EXAM: CT CHEST, ABDOMEN AND PELVIS WITHOUT CONTRAST   TECHNIQUE: Multidetector CT imaging of the chest, abdomen and pelvis was performed following the standard protocol without IV contrast.   RADIATION DOSE REDUCTION: This exam was performed according to the departmental dose-optimization program which includes automated exposure control, adjustment of the mA and/or kV according to patient size and/or use of iterative reconstruction technique.   COMPARISON:  None Available.   FINDINGS: CTA CHEST FINDINGS   Cardiovascular: Mild cardiomegaly. No pericardial effusion. Normal caliber aorta with mild atherosclerotic disease. Aortic valve thickening and calcifications. No coronary artery calcifications. Standard three-vessel aortic arch with significant stenosis. No suspicious filling defects of the central pulmonary arteries.   Mediastinum/Nodes: Esophagus is unremarkable. No pathologically enlarged nodes seen in the chest.   Lungs/Pleura: Central airways are patent. No consolidation, pleural effusion or pneumothorax. Compressive atelectasis of the left lower lobe secondary to large hiatal hernia.   Musculoskeletal: No aggressive appearing osseous lesions. Dextrocurvature of the thoracic spine.   CTA ABDOMEN  AND PELVIS FINDINGS   Hepatobiliary: No focal liver abnormality is seen. No gallstones, gallbladder wall thickening, or biliary dilatation.   Pancreas: Unremarkable. No pancreatic ductal dilatation or surrounding inflammatory changes.   Spleen: Normal in size  without focal abnormality.   Adrenals/Urinary Tract: Bilateral adrenal glands are unremarkable. Kidneys enhance symmetrically with no evidence of hydronephrosis or nephrolithiasis. Right-greater-than-left cortical scarring. No suspicious renal lesions. Bladder is unremarkable.   Stomach/Bowel: Large hiatal hernia containing the stomach and a portion of the transverse colon. Stomach is within normal limits. Diverticulosis. No evidence of bowel wall thickening, distention, or inflammatory changes.   Vascular/lymphatic: No pathologically enlarged lymph nodes seen in the abdomen or pelvis. Normal caliber abdominal aorta with mild atherosclerotic disease. Severe narrowing at the origin of the SMA due to noncalcified plaque, otherwise no significant stenosis.   Reproductive: Uterus and bilateral adnexa are unremarkable.   Other: No abdominal wall hernia or abnormality. No abdominopelvic ascites.   Musculoskeletal: No aggressive appearing osseous lesions. Levocurvature of the lumbar spine. Degenerative disc disease of the lumbar spine.   VASCULAR MEASUREMENTS PERTINENT TO TAVR:   AORTA:   Minimal Aortic Diameter-13.8 mm   Severity of Aortic Calcification-mild   RIGHT PELVIS:   Right Common Iliac Artery -   Minimal Diameter-8.9 mm   Tortuosity-none   Calcification-mild   Right External Iliac Artery -   Minimal Diameter-7.4 mm   Tortuosity-none   Calcification-none   Right Common Femoral Artery -   Minimal Diameter-7.2 mm   Tortuosity-none   Calcification-none   LEFT PELVIS:   Left Common Iliac Artery -   Minimal Diameter-9.4 mm   Tortuosity-none   Calcification-mild   Left External Iliac  Artery -   Minimal Diameter-7.4 mm   Tortuosity-moderate   Calcification-none   Left Common Femoral Artery -   Minimal Diameter-7.9 mm   Tortuosity-none   Calcification-none   Review of the MIP images confirms the above findings.   IMPRESSION: 1. Vascular findings and measurements pertinent to potential TAVR procedure, as detailed above. 2. Thickening and calcification of the aortic valve, compatible with reported clinical history of aortic stenosis. 3. Mild aortoiliac atherosclerosis. Severe narrowing at the origin of the SMA due to noncalcified plaque. 4. Large hiatal hernia containing the stomach and a portion of the transverse colon. Associated compressive atelectasis of the left lower lobe.     Electronically Signed   By: Yetta Glassman M.D.   On: 10/01/2021 12:44            Impression:   This 81 year old woman has stage D1, severe, symptomatic aortic stenosis with NYHA class II symptoms of exertional fatigue and shortness of breath consistent with chronic diastolic congestive heart failure.  I have personally reviewed her 2D echocardiogram, cardiac catheterization, and CTA studies.  Her echocardiogram shows a severely calcified and thickened aortic valve with restricted leaflet mobility.  The mean gradient was 38 mmHg with a peak gradient of 49 mmHg with a valve area of 0.5 cm with a dimensionless index of 0.25.  Left ventricular ejection fraction was normal.  Cardiac catheterization showed mild nonobstructive coronary disease.  Given her age I think that transcatheter aortic valve replacement would be the best treatment for improvement of her symptoms and to prevent left ventricular dysfunction.  Her gated cardiac CTA shows anatomy suitable for TAVR using a SAPIEN 3 valve.  She does have a tortuous aorta due to scoliosis.  Her abdominal and pelvic CTA shows adequate pelvic vascular anatomy to allow transfemoral insertion.  CT scan also shows a large hiatal hernia  into the left chest containing the stomach and a portion of the transverse colon.  She denies any history of nausea, vomiting, or dysphagia but said that she has  to watch how much she eats at one time.  This has been present on chest x-ray dating back to 2018 but looks like it may be getting larger over time.  It could certainly cause some shortness of breath if the hernia continues to increase with compression of her left lung.  It could be fixed surgically if needed.   The patient was counseled at length regarding treatment alternatives for management of severe symptomatic aortic stenosis. The risks and benefits of surgical intervention has been discussed in detail. Long-term prognosis with medical therapy was discussed. Alternative approaches such as conventional surgical aortic valve replacement, transcatheter aortic valve replacement, and palliative medical therapy were compared and contrasted at length. This discussion was placed in the context of the patient's own specific clinical presentation and past medical history. All of her questions have been addressed.    Following the decision to proceed with transcatheter aortic valve replacement, a discussion was held regarding what types of management strategies would be attempted intraoperatively in the event of life-threatening complications, including whether or not the patient would be considered a candidate for the use of cardiopulmonary bypass and/or conversion to open sternotomy for attempted surgical intervention.  I think she would be a candidate for emergent  sternotomy to manage any intraoperative complications.  The patient is aware of the fact that transient use of cardiopulmonary bypass may be necessary. The patient has been advised of a variety of complications that might develop including but not limited to risks of death, stroke, paravalvular leak, aortic dissection or other major vascular complications, aortic annulus rupture, device  embolization, cardiac rupture or perforation, mitral regurgitation, acute myocardial infarction, arrhythmia, heart block or bradycardia requiring permanent pacemaker placement, congestive heart failure, respiratory failure, renal failure, pneumonia, infection, other late complications related to structural valve deterioration or migration, or other complications that might ultimately cause a temporary or permanent loss of functional independence or other long term morbidity. The patient provides full informed consent for the procedure as described and all questions were answered.       Plan:   She will be scheduled for transfemoral TAVR using a SAPIEN 3 valve in the next few weeks.   I spent 60 minutes performing this consultation and > 50% of this time was spent face to face counseling and coordinating the care of this patient's severe symptomatic aortic stenosis.     Gaye Pollack, MD 11/17/2021

## 2021-12-14 ENCOUNTER — Other Ambulatory Visit: Payer: Self-pay

## 2021-12-14 ENCOUNTER — Encounter (HOSPITAL_COMMUNITY): Payer: Self-pay | Admitting: Internal Medicine

## 2021-12-14 ENCOUNTER — Inpatient Hospital Stay (HOSPITAL_COMMUNITY)
Admission: RE | Admit: 2021-12-14 | Discharge: 2021-12-15 | DRG: 267 | Disposition: A | Discharge status: Home / Self Care | Payer: PPO | Attending: Internal Medicine | Admitting: Internal Medicine

## 2021-12-14 ENCOUNTER — Encounter (HOSPITAL_COMMUNITY)
Admission: RE | Disposition: A | Discharge status: Home / Self Care | Payer: Self-pay | Source: Home / Self Care | Attending: Internal Medicine

## 2021-12-14 ENCOUNTER — Inpatient Hospital Stay (HOSPITAL_COMMUNITY): Payer: PPO | Admitting: Certified Registered"

## 2021-12-14 ENCOUNTER — Inpatient Hospital Stay (HOSPITAL_COMMUNITY): Payer: PPO

## 2021-12-14 ENCOUNTER — Inpatient Hospital Stay (HOSPITAL_COMMUNITY): Payer: PPO | Admitting: Physician Assistant

## 2021-12-14 DIAGNOSIS — E669 Obesity, unspecified: Secondary | ICD-10-CM | POA: Diagnosis present

## 2021-12-14 DIAGNOSIS — I2510 Atherosclerotic heart disease of native coronary artery without angina pectoris: Secondary | ICD-10-CM | POA: Diagnosis present

## 2021-12-14 DIAGNOSIS — I5032 Chronic diastolic (congestive) heart failure: Secondary | ICD-10-CM | POA: Diagnosis present

## 2021-12-14 DIAGNOSIS — I480 Paroxysmal atrial fibrillation: Secondary | ICD-10-CM | POA: Diagnosis not present

## 2021-12-14 DIAGNOSIS — Z881 Allergy status to other antibiotic agents status: Secondary | ICD-10-CM

## 2021-12-14 DIAGNOSIS — Z803 Family history of malignant neoplasm of breast: Secondary | ICD-10-CM

## 2021-12-14 DIAGNOSIS — I350 Nonrheumatic aortic (valve) stenosis: Secondary | ICD-10-CM

## 2021-12-14 DIAGNOSIS — Z7722 Contact with and (suspected) exposure to environmental tobacco smoke (acute) (chronic): Secondary | ICD-10-CM | POA: Diagnosis not present

## 2021-12-14 DIAGNOSIS — I4819 Other persistent atrial fibrillation: Secondary | ICD-10-CM | POA: Diagnosis present

## 2021-12-14 DIAGNOSIS — Z7901 Long term (current) use of anticoagulants: Secondary | ICD-10-CM

## 2021-12-14 DIAGNOSIS — Z801 Family history of malignant neoplasm of trachea, bronchus and lung: Secondary | ICD-10-CM | POA: Diagnosis not present

## 2021-12-14 DIAGNOSIS — Z9851 Tubal ligation status: Secondary | ICD-10-CM | POA: Diagnosis not present

## 2021-12-14 DIAGNOSIS — I10 Essential (primary) hypertension: Secondary | ICD-10-CM | POA: Diagnosis not present

## 2021-12-14 DIAGNOSIS — I130 Hypertensive heart and chronic kidney disease with heart failure and stage 1 through stage 4 chronic kidney disease, or unspecified chronic kidney disease: Secondary | ICD-10-CM | POA: Diagnosis present

## 2021-12-14 DIAGNOSIS — Z8249 Family history of ischemic heart disease and other diseases of the circulatory system: Secondary | ICD-10-CM

## 2021-12-14 DIAGNOSIS — Z83438 Family history of other disorder of lipoprotein metabolism and other lipidemia: Secondary | ICD-10-CM

## 2021-12-14 DIAGNOSIS — Z006 Encounter for examination for normal comparison and control in clinical research program: Secondary | ICD-10-CM

## 2021-12-14 DIAGNOSIS — E039 Hypothyroidism, unspecified: Secondary | ICD-10-CM | POA: Diagnosis present

## 2021-12-14 DIAGNOSIS — K449 Diaphragmatic hernia without obstruction or gangrene: Secondary | ICD-10-CM | POA: Diagnosis not present

## 2021-12-14 DIAGNOSIS — Z952 Presence of prosthetic heart valve: Principal | ICD-10-CM

## 2021-12-14 DIAGNOSIS — K219 Gastro-esophageal reflux disease without esophagitis: Secondary | ICD-10-CM | POA: Diagnosis present

## 2021-12-14 DIAGNOSIS — Z79899 Other long term (current) drug therapy: Secondary | ICD-10-CM | POA: Diagnosis not present

## 2021-12-14 DIAGNOSIS — Z85828 Personal history of other malignant neoplasm of skin: Secondary | ICD-10-CM | POA: Diagnosis not present

## 2021-12-14 DIAGNOSIS — Z79890 Hormone replacement therapy (postmenopausal): Secondary | ICD-10-CM | POA: Diagnosis not present

## 2021-12-14 DIAGNOSIS — Q2546 Tortuous aortic arch: Secondary | ICD-10-CM | POA: Diagnosis not present

## 2021-12-14 DIAGNOSIS — M419 Scoliosis, unspecified: Secondary | ICD-10-CM | POA: Diagnosis not present

## 2021-12-14 DIAGNOSIS — Z6826 Body mass index (BMI) 26.0-26.9, adult: Secondary | ICD-10-CM

## 2021-12-14 DIAGNOSIS — E663 Overweight: Secondary | ICD-10-CM | POA: Diagnosis present

## 2021-12-14 DIAGNOSIS — R600 Localized edema: Secondary | ICD-10-CM | POA: Diagnosis present

## 2021-12-14 DIAGNOSIS — N182 Chronic kidney disease, stage 2 (mild): Secondary | ICD-10-CM | POA: Diagnosis present

## 2021-12-14 HISTORY — PX: TRANSCATHETER AORTIC VALVE REPLACEMENT, TRANSFEMORAL: SHX6400

## 2021-12-14 HISTORY — DX: Diaphragmatic hernia without obstruction or gangrene: K44.9

## 2021-12-14 HISTORY — DX: Other persistent atrial fibrillation: I48.19

## 2021-12-14 HISTORY — PX: INTRAOPERATIVE TRANSTHORACIC ECHOCARDIOGRAM: SHX6523

## 2021-12-14 HISTORY — DX: Nonrheumatic aortic (valve) stenosis: I35.0

## 2021-12-14 HISTORY — DX: Presence of prosthetic heart valve: Z95.2

## 2021-12-14 LAB — POCT I-STAT 7, (LYTES, BLD GAS, ICA,H+H)
Acid-Base Excess: 1 mmol/L (ref 0.0–2.0)
Bicarbonate: 29.1 mmol/L — ABNORMAL HIGH (ref 20.0–28.0)
Calcium, Ion: 1.3 mmol/L (ref 1.15–1.40)
HCT: 34 % — ABNORMAL LOW (ref 36.0–46.0)
Hemoglobin: 11.6 g/dL — ABNORMAL LOW (ref 12.0–15.0)
O2 Saturation: 96 %
Potassium: 3.1 mmol/L — ABNORMAL LOW (ref 3.5–5.1)
Sodium: 143 mmol/L (ref 135–145)
TCO2: 31 mmol/L (ref 22–32)
pCO2 arterial: 60.6 mmHg — ABNORMAL HIGH (ref 32–48)
pH, Arterial: 7.29 — ABNORMAL LOW (ref 7.35–7.45)
pO2, Arterial: 91 mmHg (ref 83–108)

## 2021-12-14 LAB — POCT I-STAT, CHEM 8
BUN: 13 mg/dL (ref 8–23)
BUN: 12 mg/dL (ref 8–23)
BUN: 12 mg/dL (ref 8–23)
Calcium, Ion: 1.24 mmol/L (ref 1.15–1.40)
Calcium, Ion: 1.15 mmol/L (ref 1.15–1.40)
Calcium, Ion: 1.17 mmol/L (ref 1.15–1.40)
Chloride: 101 mmol/L (ref 98–111)
Chloride: 104 mmol/L (ref 98–111)
Chloride: 102 mmol/L (ref 98–111)
Creatinine, Ser: 0.7 mg/dL (ref 0.44–1.00)
Creatinine, Ser: 0.6 mg/dL (ref 0.44–1.00)
Creatinine, Ser: 0.7 mg/dL (ref 0.44–1.00)
Glucose, Bld: 115 mg/dL — ABNORMAL HIGH (ref 70–99)
Glucose, Bld: 119 mg/dL — ABNORMAL HIGH (ref 70–99)
Glucose, Bld: 119 mg/dL — ABNORMAL HIGH (ref 70–99)
HCT: 36 % (ref 36.0–46.0)
HCT: 34 % — ABNORMAL LOW (ref 36.0–46.0)
HCT: 33 % — ABNORMAL LOW (ref 36.0–46.0)
Hemoglobin: 12.2 g/dL (ref 12.0–15.0)
Hemoglobin: 11.6 g/dL — ABNORMAL LOW (ref 12.0–15.0)
Hemoglobin: 11.2 g/dL — ABNORMAL LOW (ref 12.0–15.0)
Potassium: 3.1 mmol/L — ABNORMAL LOW (ref 3.5–5.1)
Potassium: 2.8 mmol/L — ABNORMAL LOW (ref 3.5–5.1)
Potassium: 2.8 mmol/L — ABNORMAL LOW (ref 3.5–5.1)
Sodium: 143 mmol/L (ref 135–145)
Sodium: 142 mmol/L (ref 135–145)
Sodium: 142 mmol/L (ref 135–145)
TCO2: 30 mmol/L (ref 22–32)
TCO2: 26 mmol/L (ref 22–32)
TCO2: 27 mmol/L (ref 22–32)

## 2021-12-14 LAB — ECHOCARDIOGRAM LIMITED
AR max vel: 1.83 cm2
AV Area VTI: 2.23 cm2
AV Area mean vel: 1.95 cm2
AV Mean grad: 3.5 mmHg
AV Peak grad: 6.7 mmHg
Ao pk vel: 1.29 m/s

## 2021-12-14 LAB — ABO/RH: ABO/RH(D): A NEG

## 2021-12-14 SURGERY — IMPLANTATION, AORTIC VALVE, TRANSCATHETER, FEMORAL APPROACH
Anesthesia: Monitor Anesthesia Care | Site: Chest

## 2021-12-14 MED ORDER — FENTANYL CITRATE (PF) 100 MCG/2ML IJ SOLN: 25.0000 ug | INTRAMUSCULAR | Status: DC | PRN

## 2021-12-14 MED ORDER — CHLORHEXIDINE GLUCONATE 4 % EX LIQD: 60.0000 mL | Freq: Once | CUTANEOUS | Status: DC

## 2021-12-14 MED ORDER — ACETAMINOPHEN 500 MG PO TABS: 1000.0000 mg | ORAL_TABLET | Freq: Once | ORAL | Status: AC

## 2021-12-14 MED ORDER — PROTAMINE SULFATE 10 MG/ML IV SOLN
INTRAVENOUS | Status: DC | PRN
  Administered 2021-12-14: 160 mg via INTRAVENOUS

## 2021-12-14 MED ORDER — SODIUM CHLORIDE 0.9 % IV SOLN: INTRAVENOUS | Status: DC

## 2021-12-14 MED ORDER — PROPOFOL 10 MG/ML IV BOLUS
INTRAVENOUS | Status: DC | PRN
  Administered 2021-12-14: 20 mg via INTRAVENOUS

## 2021-12-14 MED ORDER — HEPARIN SODIUM (PORCINE) 1000 UNIT/ML IJ SOLN
INTRAMUSCULAR | Status: DC | PRN
  Administered 2021-12-14: 16000 [IU] via INTRAVENOUS

## 2021-12-14 MED ORDER — CHLORHEXIDINE GLUCONATE 0.12 % MT SOLN
15.0000 mL | Freq: Once | OROMUCOSAL | Status: AC
  Administered 2021-12-14: 15 mL via OROMUCOSAL
  Filled 2021-12-14: qty 15

## 2021-12-14 MED ORDER — SODIUM CHLORIDE 0.9 % IV SOLN: 250.0000 mL | INTRAVENOUS | Status: DC | PRN

## 2021-12-14 MED ORDER — PHENYLEPHRINE 80 MCG/ML (10ML) SYRINGE FOR IV PUSH (FOR BLOOD PRESSURE SUPPORT)
PREFILLED_SYRINGE | INTRAVENOUS | Status: DC | PRN
  Administered 2021-12-14 (×2): 160 ug via INTRAVENOUS

## 2021-12-14 MED ORDER — 0.9 % SODIUM CHLORIDE (POUR BTL) OPTIME
TOPICAL | Status: DC | PRN
  Administered 2021-12-14: 1000 mL

## 2021-12-14 MED ORDER — TRAMADOL HCL 50 MG PO TABS: 50.0000 mg | ORAL_TABLET | ORAL | Status: DC | PRN

## 2021-12-14 MED ORDER — SODIUM CHLORIDE 0.9% FLUSH
3.0000 mL | Freq: Two times a day (BID) | INTRAVENOUS | Status: DC
  Administered 2021-12-15: 3 mL via INTRAVENOUS

## 2021-12-14 MED ORDER — LACTATED RINGERS IV SOLN: INTRAVENOUS | Status: DC | PRN

## 2021-12-14 MED ORDER — POTASSIUM CHLORIDE CRYS ER 20 MEQ PO TBCR
EXTENDED_RELEASE_TABLET | ORAL | Status: AC
  Filled 2021-12-14: qty 4

## 2021-12-14 MED ORDER — CHLORHEXIDINE GLUCONATE 4 % EX LIQD: 30.0000 mL | CUTANEOUS | Status: DC

## 2021-12-14 MED ORDER — HEPARIN 6000 UNIT IRRIGATION SOLUTION
Status: AC
  Filled 2021-12-14: qty 1500

## 2021-12-14 MED ORDER — PANTOPRAZOLE SODIUM 40 MG PO TBEC
40.0000 mg | DELAYED_RELEASE_TABLET | Freq: Every day | ORAL | Status: DC
  Administered 2021-12-14 – 2021-12-15 (×2): 40 mg via ORAL
  Filled 2021-12-14 (×2): qty 1

## 2021-12-14 MED ORDER — FENTANYL CITRATE (PF) 250 MCG/5ML IJ SOLN
INTRAMUSCULAR | Status: AC
  Filled 2021-12-14: qty 5

## 2021-12-14 MED ORDER — LEVOCETIRIZINE DIHYDROCHLORIDE 5 MG PO TABS: 5.0000 mg | ORAL_TABLET | Freq: Every evening | ORAL | Status: DC

## 2021-12-14 MED ORDER — SODIUM CHLORIDE 0.9 % IV SOLN: INTRAVENOUS | Status: AC

## 2021-12-14 MED ORDER — LORATADINE 10 MG PO TABS
10.0000 mg | ORAL_TABLET | Freq: Every day | ORAL | Status: DC
  Administered 2021-12-14: 10 mg via ORAL
  Filled 2021-12-14: qty 1

## 2021-12-14 MED ORDER — ORAL CARE MOUTH RINSE: 15.0000 mL | OROMUCOSAL | Status: DC | PRN

## 2021-12-14 MED ORDER — CLEVIDIPINE BUTYRATE 0.5 MG/ML IV EMUL
INTRAVENOUS | Status: DC | PRN
  Administered 2021-12-14: 4 mg/h via INTRAVENOUS

## 2021-12-14 MED ORDER — FENTANYL CITRATE (PF) 250 MCG/5ML IJ SOLN
INTRAMUSCULAR | Status: DC | PRN
  Administered 2021-12-14 (×2): 50 ug via INTRAVENOUS

## 2021-12-14 MED ORDER — POTASSIUM CHLORIDE CRYS ER 20 MEQ PO TBCR
80.0000 meq | EXTENDED_RELEASE_TABLET | Freq: Once | ORAL | Status: AC
  Administered 2021-12-14: 80 meq via ORAL

## 2021-12-14 MED ORDER — IODIXANOL 320 MG/ML IV SOLN
INTRAVENOUS | Status: DC | PRN
  Administered 2021-12-14: 150 mL

## 2021-12-14 MED ORDER — PROPOFOL 500 MG/50ML IV EMUL
INTRAVENOUS | Status: DC | PRN
  Administered 2021-12-14: 20 ug/kg/min via INTRAVENOUS

## 2021-12-14 MED ORDER — VANCOMYCIN HCL IN DEXTROSE 1-5 GM/200ML-% IV SOLN
1000.0000 mg | Freq: Once | INTRAVENOUS | Status: AC
  Administered 2021-12-15: 1000 mg via INTRAVENOUS
  Filled 2021-12-14: qty 200

## 2021-12-14 MED ORDER — POTASSIUM CHLORIDE 20 MEQ PO PACK: 80.0000 meq | PACK | Freq: Once | ORAL | Status: DC

## 2021-12-14 MED ORDER — ONDANSETRON HCL 4 MG/2ML IJ SOLN
4.0000 mg | Freq: Four times a day (QID) | INTRAMUSCULAR | Status: DC | PRN
  Administered 2021-12-14: 4 mg via INTRAVENOUS
  Filled 2021-12-14: qty 2

## 2021-12-14 MED ORDER — ACETAMINOPHEN 650 MG RE SUPP: 650.0000 mg | Freq: Four times a day (QID) | RECTAL | Status: DC | PRN

## 2021-12-14 MED ORDER — LIDOCAINE HCL 1 % IJ SOLN
INTRAMUSCULAR | Status: DC | PRN
  Administered 2021-12-14: 20 mL via INTRADERMAL

## 2021-12-14 MED ORDER — METOPROLOL TARTRATE 25 MG PO TABS
37.5000 mg | ORAL_TABLET | Freq: Two times a day (BID) | ORAL | Status: DC
  Administered 2021-12-14 – 2021-12-15 (×2): 37.5 mg via ORAL
  Filled 2021-12-14 (×2): qty 1

## 2021-12-14 MED ORDER — NITROGLYCERIN IN D5W 200-5 MCG/ML-% IV SOLN: 0.0000 ug/min | INTRAVENOUS | Status: DC

## 2021-12-14 MED ORDER — APIXABAN 5 MG PO TABS
5.0000 mg | ORAL_TABLET | Freq: Two times a day (BID) | ORAL | Status: DC
  Administered 2021-12-15: 5 mg via ORAL
  Filled 2021-12-14: qty 1

## 2021-12-14 MED ORDER — LIDOCAINE 2% (20 MG/ML) 5 ML SYRINGE
INTRAMUSCULAR | Status: DC | PRN
  Administered 2021-12-14: 100 mg via INTRAVENOUS

## 2021-12-14 MED ORDER — OXYCODONE HCL 5 MG PO TABS: 5.0000 mg | ORAL_TABLET | ORAL | Status: DC | PRN

## 2021-12-14 MED ORDER — LEVOTHYROXINE SODIUM 88 MCG PO TABS
88.0000 ug | ORAL_TABLET | Freq: Every day | ORAL | Status: DC
  Administered 2021-12-15: 88 ug via ORAL
  Filled 2021-12-14: qty 1

## 2021-12-14 MED ORDER — ACETAMINOPHEN 500 MG PO TABS
ORAL_TABLET | ORAL | Status: AC
  Administered 2021-12-14: 1000 mg via ORAL
  Filled 2021-12-14: qty 2

## 2021-12-14 MED ORDER — LIDOCAINE HCL 1 % IJ SOLN
INTRAMUSCULAR | Status: AC
  Filled 2021-12-14: qty 20

## 2021-12-14 MED ORDER — HEPARIN 6000 UNIT IRRIGATION SOLUTION
Status: DC | PRN
  Administered 2021-12-14 (×3): 1

## 2021-12-14 MED ORDER — ACETAMINOPHEN 325 MG PO TABS: 650.0000 mg | ORAL_TABLET | Freq: Four times a day (QID) | ORAL | Status: DC | PRN

## 2021-12-14 MED ORDER — EPHEDRINE SULFATE-NACL 50-0.9 MG/10ML-% IV SOSY
PREFILLED_SYRINGE | INTRAVENOUS | Status: DC | PRN
  Administered 2021-12-14: 10 mg via INTRAVENOUS

## 2021-12-14 MED ORDER — PROPOFOL 1000 MG/100ML IV EMUL
INTRAVENOUS | Status: AC
  Filled 2021-12-14: qty 100

## 2021-12-14 MED ORDER — SODIUM CHLORIDE 0.9% FLUSH: 3.0000 mL | INTRAVENOUS | Status: DC | PRN

## 2021-12-14 MED ORDER — DIGOXIN 125 MCG PO TABS
0.1250 mg | ORAL_TABLET | Freq: Every day | ORAL | Status: DC
  Administered 2021-12-15: 0.125 mg via ORAL
  Filled 2021-12-14: qty 1

## 2021-12-14 MED ORDER — MORPHINE SULFATE (PF) 2 MG/ML IV SOLN: 1.0000 mg | INTRAVENOUS | Status: DC | PRN

## 2021-12-14 SURGICAL SUPPLY — 73 items
ADH SKN CLS APL DERMABOND .7 (GAUZE/BANDAGES/DRESSINGS) ×2
APL PRP STRL LF DISP 70% ISPRP (MISCELLANEOUS) ×2
BAG COUNTER SPONGE SURGICOUNT (BAG) ×2 IMPLANT
BAG DECANTER FOR FLEXI CONT (MISCELLANEOUS) IMPLANT
BAG SNAP BAND KOVER 36X36 (MISCELLANEOUS) IMPLANT
BAG SPNG CNTER NS LX DISP (BAG) ×2
BAND INSRT 18 STRL LF DISP RB (MISCELLANEOUS) ×2
BAND RUBBER #18 3X1/16 STRL (MISCELLANEOUS) IMPLANT
BLADE CLIPPER SURG (BLADE) IMPLANT
BLADE STERNUM SYSTEM 6 (BLADE) IMPLANT
CABLE ADAPT CONN TEMP 6FT (ADAPTER) ×2 IMPLANT
CANISTER SUCT 3000ML PPV (MISCELLANEOUS) IMPLANT
CATH DIAG EXPO 6F AL1 (CATHETERS) IMPLANT
CATH DIAG EXPO 6F VENT PIG 145 (CATHETERS) ×4 IMPLANT
CATH S G BIP PACING (CATHETERS) ×2 IMPLANT
CHLORAPREP W/TINT 26 (MISCELLANEOUS) ×2 IMPLANT
CLOSURE MYNX CONTROL 6F/7F (Vascular Products) IMPLANT
CLOSURE PERCLOSE PROSTYLE (VASCULAR PRODUCTS) IMPLANT
CNTNR URN SCR LID CUP LEK RST (MISCELLANEOUS) ×4 IMPLANT
CONT SPEC 4OZ STRL OR WHT (MISCELLANEOUS) ×4
COVER BACK TABLE 80X110 HD (DRAPES) IMPLANT
DERMABOND ADVANCED .7 DNX12 (GAUZE/BANDAGES/DRESSINGS) ×2 IMPLANT
DEVICE CLOSURE PERCLS PRGLD 6F (VASCULAR PRODUCTS) ×4 IMPLANT
DRSG TEGADERM 4X10 (GAUZE/BANDAGES/DRESSINGS) IMPLANT
DRSG TEGADERM 4X4.75 (GAUZE/BANDAGES/DRESSINGS) ×4 IMPLANT
ELECT REM PT RETURN 9FT ADLT (ELECTROSURGICAL) ×2
ELECTRODE REM PT RTRN 9FT ADLT (ELECTROSURGICAL) ×2 IMPLANT
GAUZE SPONGE 4X4 12PLY STRL (GAUZE/BANDAGES/DRESSINGS) ×2 IMPLANT
GLOVE BIO SURGEON STRL SZ7.5 (GLOVE) IMPLANT
GLOVE BIO SURGEON STRL SZ8 (GLOVE) IMPLANT
GLOVE ECLIPSE 7.0 STRL STRAW (GLOVE) ×2 IMPLANT
GLOVE ORTHO TXT STRL SZ7.5 (GLOVE) IMPLANT
GOWN STRL REUS W/ TWL LRG LVL3 (GOWN DISPOSABLE) IMPLANT
GOWN STRL REUS W/ TWL XL LVL3 (GOWN DISPOSABLE) ×2 IMPLANT
GOWN STRL REUS W/TWL LRG LVL3 (GOWN DISPOSABLE) ×2
GOWN STRL REUS W/TWL XL LVL3 (GOWN DISPOSABLE) ×6
GUIDEWIRE SAF TJ AMPL .035X180 (WIRE) ×2 IMPLANT
GUIDEWIRE SAFE TJ AMPLATZ EXST (WIRE) ×2 IMPLANT
KIT BASIN OR (CUSTOM PROCEDURE TRAY) ×2 IMPLANT
KIT HEART LEFT (KITS) ×2 IMPLANT
KIT SAPIAN 3 ULTRA RESILIA 23 (Valve) IMPLANT
KIT TURNOVER KIT B (KITS) ×2 IMPLANT
NS IRRIG 1000ML POUR BTL (IV SOLUTION) ×2 IMPLANT
PACK ENDO MINOR (CUSTOM PROCEDURE TRAY) ×2 IMPLANT
PAD ARMBOARD 7.5X6 YLW CONV (MISCELLANEOUS) ×4 IMPLANT
PAD ELECT DEFIB RADIOL ZOLL (MISCELLANEOUS) ×2 IMPLANT
PERCLOSE PROGLIDE 6F (VASCULAR PRODUCTS) ×4
POSITIONER HEAD DONUT 9IN (MISCELLANEOUS) ×2 IMPLANT
SET MICROPUNCTURE 5F STIFF (MISCELLANEOUS) ×2 IMPLANT
SHEATH BRITE TIP 7FR 35CM (SHEATH) ×2 IMPLANT
SHEATH PINNACLE 6F 10CM (SHEATH) ×2 IMPLANT
SHEATH PINNACLE 8F 10CM (SHEATH) ×2 IMPLANT
SLEEVE REPOSITIONING LENGTH 30 (MISCELLANEOUS) ×2 IMPLANT
SPIKE FLUID TRANSFER (MISCELLANEOUS) ×2 IMPLANT
SPONGE T-LAP 18X18 ~~LOC~~+RFID (SPONGE) ×2 IMPLANT
STOPCOCK MORSE 400PSI 3WAY (MISCELLANEOUS) ×4 IMPLANT
SUT PROLENE 6 0 C 1 30 (SUTURE) IMPLANT
SUT SILK  1 MH (SUTURE) ×2
SUT SILK 1 MH (SUTURE) ×2 IMPLANT
SYR 3ML LL SCALE MARK (SYRINGE) IMPLANT
SYR 50ML LL SCALE MARK (SYRINGE) ×2 IMPLANT
SYR BULB IRRIG 60ML STRL (SYRINGE) IMPLANT
TOWEL GREEN STERILE (TOWEL DISPOSABLE) ×4 IMPLANT
TRANSDUCER W/STOPCOCK (MISCELLANEOUS) ×4 IMPLANT
TRAY FOLEY SLVR 14FR TEMP STAT (SET/KITS/TRAYS/PACK) IMPLANT
TUBE SUCT INTRACARD DLP 20F (MISCELLANEOUS) IMPLANT
TUBING CIL FLEX 10 FLL-RA (TUBING) IMPLANT
WIRE EMERALD 3MM-J .035X150CM (WIRE) ×2 IMPLANT
WIRE EMERALD 3MM-J .035X260CM (WIRE) ×2 IMPLANT
WIRE EMERALD ST .035X260CM (WIRE) ×4 IMPLANT
WIRE MICRO SET SILHO 5FR 7 (SHEATH) IMPLANT
WIRE SAFARI SM CURVE 275 (WIRE) IMPLANT
WIRE TORQFLEX AUST .018X40CM (WIRE) IMPLANT

## 2021-12-14 NOTE — Anesthesia Postprocedure Evaluation (Signed)
Anesthesia Post Note  Patient: TRESA JOLLEY  Procedure(s) Performed: Transcatheter Aortic Valve Replacement, Transfemoral using Edwards 23 MM SAPIEN 3 Ultra (Chest) INTRAOPERATIVE TRANSTHORACIC ECHOCARDIOGRAM     Patient location during evaluation: PACU Anesthesia Type: MAC Level of consciousness: awake and alert Pain management: pain level controlled Vital Signs Assessment: post-procedure vital signs reviewed and stable Respiratory status: spontaneous breathing, nonlabored ventilation and respiratory function stable Cardiovascular status: stable and blood pressure returned to baseline Anesthetic complications: no   No notable events documented.  Last Vitals:  Vitals:   12/14/21 1632 12/14/21 1719  BP: (!) 110/49 (!) 109/44  Pulse: 70 64  Resp: 11 15  Temp: 36.6 C 36.7 C  SpO2:      Last Pain:  Vitals:   12/14/21 1719  TempSrc: Temporal  PainSc: 0-No pain                 Audry Pili

## 2021-12-14 NOTE — Progress Notes (Deleted)
Bennett Scrape in the room with patient now. STAT echo ordered at this time. Will continue to monitor.

## 2021-12-14 NOTE — Progress Notes (Deleted)
Dr Burt Knack in the room at this time with Patient. Echo being done at this time.

## 2021-12-14 NOTE — Anesthesia Preprocedure Evaluation (Addendum)
Anesthesia Evaluation  Patient identified by MRN, date of birth, ID band Patient awake    Reviewed: Allergy & Precautions, NPO status , Patient's Chart, lab work & pertinent test results, reviewed documented beta blocker date and time   History of Anesthesia Complications (+) PONV  Airway Mallampati: II  TM Distance: >3 FB Neck ROM: Full    Dental  (+) Missing, Dental Advisory Given, Caps   Pulmonary shortness of breath,    breath sounds clear to auscultation       Cardiovascular hypertension, Pt. on medications and Pt. on home beta blockers (-) angina+ CAD (cath: Mild obstructive coronary artery disease)  + dysrhythmias Atrial Fibrillation + Valvular Problems/Murmurs (severe) AS  Rhythm:Irregular Rate:Normal  05/2021 ECHO: EF 55 to 60%. LV has normal function, no regional wall motion abnormalities, mild LVH, normal RVF, trivial MR, severe calcifcation of the aortic valve. mild to moderate AI, Severe AS with mean  gradient 30.2 mmHg, peak gradient 49.2 mmHg. AVA (VTI) measures 0.50 cm, mild-mod TR   Neuro/Psych Anxiety negative neurological ROS     GI/Hepatic Neg liver ROS, GERD  Controlled and Medicated,  Endo/Other  Hypothyroidism   Renal/GU negative Renal ROS     Musculoskeletal   Abdominal   Peds  Hematology eliquis   Anesthesia Other Findings   Reproductive/Obstetrics                            Anesthesia Physical Anesthesia Plan  ASA: 3  Anesthesia Plan: MAC   Post-op Pain Management: Tylenol PO (pre-op)*   Induction:   PONV Risk Score and Plan: 3 and Ondansetron, Dexamethasone and Treatment may vary due to age or medical condition  Airway Management Planned: Simple Face Mask  Additional Equipment: Arterial line  Intra-op Plan:   Post-operative Plan:   Informed Consent: I have reviewed the patients History and Physical, chart, labs and discussed the procedure including  the risks, benefits and alternatives for the proposed anesthesia with the patient or authorized representative who has indicated his/her understanding and acceptance.     Dental advisory given  Plan Discussed with: CRNA and Surgeon  Anesthesia Plan Comments:        Anesthesia Quick Evaluation

## 2021-12-14 NOTE — Anesthesia Procedure Notes (Signed)
Arterial Line Insertion Performed by: Mertice Uffelman B, CRNA, CRNA  Patient location: Pre-op. Preanesthetic checklist: patient identified, IV checked, site marked, risks and benefits discussed, surgical consent, monitors and equipment checked, pre-op evaluation, timeout performed and anesthesia consent Lidocaine 1% used for infiltration Right, radial was placed Catheter size: 20 G Hand hygiene performed , maximum sterile barriers used  and Seldinger technique used Allen's test indicative of satisfactory collateral circulation Attempts: 1 Procedure performed without using ultrasound guided technique. Following insertion, dressing applied and Biopatch. Post procedure assessment: normal  Patient tolerated the procedure well with no immediate complications.    

## 2021-12-14 NOTE — Discharge Instructions (Signed)

## 2021-12-14 NOTE — Progress Notes (Addendum)
Assisted pt to bathroom after bedrest up. Tolerated activity well w/ very minimal assist. R groin noted to have a small amount of oozing after activity. Pressure held for about 15 minutes and area of blood on dressing was marked. Will continue to monitor.

## 2021-12-14 NOTE — Progress Notes (Signed)
   12/14/21 1945  Vitals  Temp 98.4 F (36.9 C)  Temp Source Oral  BP 133/66  MAP (mmHg) 85  BP Location Right Arm  BP Method Automatic  Patient Position (if appropriate) Lying  Pulse Rate 68  Pulse Rate Source Monitor  ECG Heart Rate 69  Resp 16  Level of Consciousness  Level of Consciousness Alert  MEWS COLOR  MEWS Score Color Green  Oxygen Therapy  SpO2 95 %  O2 Device Room Air  Pain Assessment  Pain Scale 0-10  Pain Score 0  MEWS Score  MEWS Temp 0  MEWS Systolic 0  MEWS Pulse 0  MEWS RR 0  MEWS LOC 0  MEWS Score 0   Pt received from cath lab. VSS. Pt AOx4, no complaints. Groin sites level 0. Family at bedside.  Provided w/ snack tray. No additional needs at this time.

## 2021-12-14 NOTE — Progress Notes (Deleted)
Patient states Mid-Sternal Chest Pressure. States 5 out of 10 Chest Pain. EKG done and [employer]. Will continue to monitor patient.

## 2021-12-14 NOTE — Op Note (Signed)
[facility]   MULTIDISCIPLINARY HEART VALVE TEAM   TAVR OPERATIVE NOTE   Date of Procedure:  12/14/2021  Preoperative Diagnosis: Severe Aortic Stenosis   Postoperative Diagnosis: Same   Procedure:   Transcatheter Aortic Valve Replacement - Percutaneous Right Transfemoral Approach  Edwards Sapien 3 Ultra THV (size 23 mm, model # 9755RSL, serial # [id number])   Co-Surgeons:  Coralie Common MD and Lenna Sciara, MD   Anesthesiologist:  Annye Asa MD  Echocardiographer:  Raquel Sarna Senior RDCS  Pre-operative Echo Findings: Severe aortic stenosis Normal left ventricular systolic function  Post-operative Echo Findings: no paravalvular leak Normal left ventricular systolic function   BRIEF CLINICAL NOTE AND INDICATIONS FOR SURGERY  This 81 year old woman has stage D1, severe, symptomatic aortic stenosis with NYHA class II symptoms of exertional fatigue and shortness of breath consistent with chronic diastolic congestive heart failure.  I have personally reviewed her 2D echocardiogram, cardiac catheterization, and CTA studies.  Her echocardiogram shows a severely calcified and thickened aortic valve with restricted leaflet mobility.  The mean gradient was 38 mmHg with a peak gradient of 49 mmHg with a valve area of 0.5 cm with a dimensionless index of 0.25.  Left ventricular ejection fraction was normal.  Cardiac catheterization showed mild nonobstructive coronary disease.  Given her age I think that transcatheter aortic valve replacement would be the best treatment for improvement of her symptoms and to prevent left ventricular dysfunction.    DETAILS OF THE OPERATIVE PROCEDURE  PREPARATION:    The patient was brought to the operating room on the above mentioned date and appropriate monitoring was established by the anesthesia team. The patient was placed in the supine position on the operating table.  Intravenous antibiotics were administered. The patient was  monitored closely throughout the procedure under conscious sedation.  Baseline transthoracic echocardiogram was performed. The patient's abdomen and both groins were prepped and draped in a sterile manner. A time out procedure was performed.   PERIPHERAL ACCESS:    Using the modified Seldinger technique, femoral arterial and venous access was obtained with placement of 6 Fr sheaths on the left side.  A pigtail diagnostic catheter was passed through the left arterial sheath under fluoroscopic guidance into the aortic root. Aortic root angiography was performed in order to determine the optimal angiographic angle for valve deployment.   TRANSFEMORAL ACCESS:   Percutaneous transfemoral access and sheath placement was performed using ultrasound guidance.  The right common femoral artery was cannulated using a micropuncture needle and appropriate location was verified using hand injection angiogram.  A pair of Abbott Perclose percutaneous closure devices were placed and a 6 French sheath replaced into the femoral artery.  The patient was heparinized systemically and ACT verified > 250 seconds.    A 14 Fr transfemoral E-sheath was introduced into the right common femoral artery after progressively dilating over an Amplatz superstiff wire. An pigtail catheter was used to direct a straight-tip exchange length wire across the native aortic valve into the left ventricle. This was exchanged out for a pigtail catheter and position was confirmed in the LV apex. Simultaneous LV and Ao pressures were recorded. LVEDP was 15-18 mmHg The pigtail catheter was exchanged for a Safari wire in the LV apex. Utilizing direct LV pacing, the pacemaker was tested to ensure stable lead placement and pacemaker capture.   BALLOON AORTIC VALVULOPLASTY:   Was not done    TRANSCATHETER HEART VALVE DEPLOYMENT:   An Edwards Sapien 3 Ultra transcatheter heart valve (size  23 mm) was prepared and crimped per manufacturer's  guidelines, and the proper orientation of the valve is confirmed on the [employer] delivery system. The valve was advanced through the introducer sheath using normal technique until in an appropriate position in the abdominal aorta beyond the sheath tip. The balloon was then retracted and using the fine-tuning wheel was centered on the valve. The valve was then advanced across the aortic arch using appropriate flexion of the catheter. The valve was carefully positioned across the aortic valve annulus. The Commander catheter was retracted using normal technique. Once final position of the valve has been confirmed by angiographic assessment, the valve is deployed during rapid ventricular pacing to maintain systolic blood pressure < 50 mmHg and pulse pressure < 10 mmHg. The balloon inflation is held for >3 seconds after reaching full deployment volume. Once the balloon has fully deflated the balloon is retracted into the ascending aorta and valve function is assessed using echocardiography. There is felt to be no paravalvular leak and no central aortic insufficiency.  The patient's hemodynamic recovery following valve deployment is good.  The deployment balloon and guidewire are both removed.    PROCEDURE COMPLETION:   The sheath was removed and femoral artery closure performed.  Protamine was administered once femoral arterial repair was complete. A Right femoral angiogram was performed that showed no stenosis and good closure of the access site. The pigtail catheter and femoral sheaths were removed with manual pressure used for venous hemostasis.  A Mynx femoral closure device was utilized following removal of the diagnostic sheath in the left femoral artery.  The patient tolerated the procedure well and is transported to the cath lab recovery area in stable condition. There were no immediate intraoperative complications. All sponge instrument and needle counts are verified correct at completion of the  operation.   No blood products were administered during the operation.  The patient received a total of 95 mL of intravenous contrast during the procedure.   Coralie Common, MD 12/14/2021 4:19 PM

## 2021-12-14 NOTE — Transfer of Care (Signed)
Immediate Anesthesia Transfer of Care Note  Patient: Autumn Moyer  Procedure(s) Performed: Transcatheter Aortic Valve Replacement, Transfemoral using Edwards 23 MM SAPIEN 3 Ultra (Chest) INTRAOPERATIVE TRANSTHORACIC ECHOCARDIOGRAM  Patient Location: Cath Lab  Anesthesia Type:MAC  Level of Consciousness: drowsy and patient cooperative  Airway & Oxygen Therapy: Patient Spontanous Breathing and Patient connected to nasal cannula oxygen  Post-op Assessment: Report given to RN and Post -op Vital signs reviewed and stable  Post vital signs: Reviewed and stable  Last Vitals:  Vitals Value Taken Time  BP 110/49 12/14/21 1632  Temp 36.6 C 12/14/21 1632  Pulse 70 12/14/21 1632  Resp 11 12/14/21 1632  SpO2      Last Pain:  Vitals:   12/14/21 1632  TempSrc: Temporal  PainSc: 0-No pain         Complications: No notable events documented.

## 2021-12-14 NOTE — Op Note (Signed)
[facility]  TAVR OPERATIVE NOTE   Date of Procedure:  12/14/2021  Preoperative Diagnosis: Severe Aortic Stenosis   Postoperative Diagnosis: Same   Procedure:   Transcatheter Aortic Valve Replacement - Transfemoral Approach  Edwards Sapien 3 Resilia THV (size 23 mm, model # 9755RLS, serial # [id number])   Co-Surgeons:   Gaye Pollack, MD and Lenna Sciara, MD Anesthesiologist:  Renold Don, MD  Echocardiographer:  Rudean Haskell, MD  Pre-operative Echo Findings: Severe aortic stenosis Normal left ventricular systolic function  Post-operative Echo Findings: No paravalvular leak Normal left ventricular systolic function  Left Heart Catheterization Findings: Left ventricular end-diastolic pressure of 69CVEL   BRIEF CLINICAL NOTE AND INDICATIONS FOR SURGERY  The patient is a 81 year old female with a history of severe aortic stenosis, paroxysmal atrial fibrillation on anticoagulation, hypertension, hypothyroidism, and stage III chronic kidney disease who was referred for NYHA class II symptoms of dyspnea.  During the course of the patient's preoperative work up they have been evaluated comprehensively by a multidisciplinary team of specialists coordinated through the [facility] in the [facility].  They have been demonstrated to suffer from symptomatic severe aortic stenosis as noted above. The patient has been counseled extensively as to the relative risks and benefits of all options for the treatment of severe aortic stenosis including long term medical therapy, conventional surgery for aortic valve replacement, and transcatheter aortic valve replacement.  The patient has been independently evaluated by Dr. Cyndia Bent with CT surgery and they are felt to be at high risk for conventional surgical aortic valve replacement. The surgeon indicated the patient would be a poor candidate for conventional surgery. Based upon  review of all of the patient's preoperative diagnostic tests they are felt to be candidate for transcatheter aortic valve replacement using the transfemoral approach as an alternative to high risk conventional surgery.    Following the decision to proceed with transcatheter aortic valve replacement, a discussion has been held regarding what types of management strategies would be attempted intraoperatively in the event of life-threatening complications, including whether or not the patient would be considered a candidate for the use of cardiopulmonary bypass and/or conversion to open sternotomy for attempted surgical intervention.  The patient has been advised of a variety of complications that might develop peculiar to this approach including but not limited to risks of death, stroke, paravalvular leak, aortic dissection or other major vascular complications, aortic annulus rupture, device embolization, cardiac rupture or perforation, acute myocardial infarction, arrhythmia, heart block or bradycardia requiring permanent pacemaker placement, congestive heart failure, respiratory failure, renal failure, pneumonia, infection, other late complications related to structural valve deterioration or migration, or other complications that might ultimately cause a temporary or permanent loss of functional independence or other long term morbidity.  The patient provides full informed consent for the procedure as described and all questions were answered preoperatively.    DETAILS OF THE OPERATIVE PROCEDURE  PREPARATION:   The patient is brought to the operating room on the above mentioned date and central monitoring was established by the anesthesia team including placement of a radial arterial line. The patient is placed in the supine position on the operating table.  Intravenous antibiotics are administered. Conscious sedation is used.   Baseline transthoracic echocardiogram was performed. The patient's chest,  abdomen, both groins, and both lower extremities are prepared and draped in a sterile manner. A time out procedure is performed.   PERIPHERAL ACCESS:   Using the  modified Seldinger technique, femoral arterial and venous access were obtained with placement of a 6 Fr sheath in the artery and a 7 Fr sheath in the vein on the left and right side using u/s guidance.  A pigtail diagnostic catheter was passed through the femoral arterial sheath under fluoroscopic guidance into the aortic root.  Aortic root angiography was performed in order to determine the optimal angiographic angle for valve deployment.  TRANSFEMORAL ACCESS:  A micropuncture kit was used to gain access to the right femoral artery using u/s guidance. Position confirmed with angiography. Pre-closure with double ProGlide closure devices. The patient was heparinized systemically and ACT verified > 250 seconds.    A 14 Fr transfemoral E-sheath was introduced into the right femoral artery after progressively dilating over an Amplatz superstiff wire. A pigtail catheter was used to direct a straight-tip exchange length wire across the native aortic valve into the left ventricle. This was exchanged out for a pigtail catheter and position was confirmed in the LV apex. Simultaneous left ventricular, aortic, and left ventricular end-diastolic pressures were recorded.  The pigtail catheter was then exchanged for an Safari wire in the LV apex.  Direct LV pacing thresholds were assessed and found to be adequate.   TRANSCATHETER HEART VALVE DEPLOYMENT:  An Edwards Sapien 3 THV (size 23 mm) was prepared and crimped per manufacturer's guidelines, and the proper orientation of the valve is confirmed on the [employer] delivery system. The valve was advanced through the introducer sheath using normal technique until in an appropriate position in the abdominal aorta beyond the sheath tip. The balloon was then retracted and using the fine-tuning wheel was  centered on the valve. The valve was then advanced across the aortic arch using appropriate flexion of the catheter. The valve was carefully positioned across the aortic valve annulus. The Commander catheter was retracted using normal technique. Once final position of the valve has been confirmed by angiographic assessment, the valve is deployed while temporarily holding ventilation and during rapid ventricular pacing to maintain systolic blood pressure < 50 mmHg and pulse pressure < 10 mmHg. The balloon inflation is held for >3 seconds after reaching full deployment volume. Once the balloon has fully deflated the balloon is retracted into the ascending aorta and valve function is assessed using TTE. There is felt to be no paravalvular leak and no central aortic insufficiency.  The patient's hemodynamic recovery following valve deployment is good.  The deployment balloon and guidewire are both removed. Echo demostrated acceptable post-procedural gradients, stable mitral valve function, and no AI.   PROCEDURE COMPLETION:  The sheath was then removed and closure devices were completed. Protamine was administered once femoral arterial repair was complete. The temporary pacemaker, pigtail catheters and femoral sheaths were removed with a Mynx closure device placed in the artery and manual pressure used for venous hemostasis.    The patient tolerated the procedure well and is transported to the surgical intensive care in stable condition. There were no immediate intraoperative complications. All sponge instrument and needle counts are verified correct at completion of the operation.   No blood products were administered during the operation.  The patient received a total of 150 mL of intravenous contrast during the procedure.  Early Osmond MD 12/14/2021 4:20 PM

## 2021-12-14 NOTE — Discharge Summary (Incomplete)
[facility] TE[facility]  Discharge Summary    Patient ID: Autumn Moyer MRN: [id number]; DOB: 06-30-1940  Admit date: 12/14/2021 Discharge date: 12/15/2021  Primary Care Provider: Sharion Balloon, FNP  Primary Cardiologist: Dr. Percival Spanish, MD/ Dr. Ali Lowe, MD & Dr. Lavonna Monarch, MD (TAVR)  Discharge Diagnoses    Principal Problem:   S/P TAVR (transcatheter aortic valve replacement) Active Problems:   Hypothyroid   Essential hypertension, benign   Esophageal reflux   Obesity (BMI 30.0-34.9)   Severe aortic stenosis   Hiatal hernia   Long term current use of anticoagulant therapy   Allergies Allergies  Allergen Reactions   Noroxin [Norfloxacin] Nausea And Vomiting   Augmentin [Amoxicillin-Pot Clavulanate] Other (See Comments)    Gums swelling, lips numb    Diagnostic Studies/Procedures    [facility]  TAVR OPERATIVE NOTE     Date of Procedure:                12/14/2021   Preoperative Diagnosis:      Severe Aortic Stenosis    Postoperative Diagnosis:    Same    Procedure:        Transcatheter Aortic Valve Replacement - Transfemoral Approach             Edwards Sapien 3 Resilia THV (size 23 mm, model # 9755RLS, serial # [id number])              Co-Surgeons:                         Gaye Pollack, MD and Lenna Sciara, MD Anesthesiologist:                  Renold Don, MD   Echocardiographer:              Rudean Haskell, MD   Pre-operative Echo Findings: Severe aortic stenosis Normal left ventricular systolic function   Post-operative Echo Findings: No paravalvular leak Normal left ventricular systolic function   Left Heart Catheterization Findings: Left ventricular end-diastolic pressure of 89VQXI _____________    Echo 12/15/21: completed but pending formal read at the time of discharge   History of Present Illness     Autumn Moyer is a 81 y.o. female with a history of hypertension, paroxysmal  atrial fibrillation on anticoagulation, hypothyroidism, and severe aortic stenosis who presented to [facility] on 12/14/21 for planned TAVR.   Ms. Beel has been followed with periodic echocardiograms by Dr. Percival Spanish since 2016. In 03/2020 echocardiogram showed moderate aortic valve stenosis with an AVA by VTI measuring 1.24 cm and mean gradient 26.8 mmHg. She was then diagnosed with atrial fibrillation and was treated with anticoagulation and rate control  medications. On follow up, she was having SOB and was set up for a [place] which was negative for ischemia or infarct. Her symptoms continued therefore repeat echo 05/2021 showed worsening AS with a mean gradient at 38 mmHg with an AVA at 0.5cm2. She was then referred to the Structural Heart team for further evaluation and possible TAVR.   She underwent R/LHC which showed mild obstructive CAD with a proximal LAD lesion at 20% stenosis. CT imagine performed that showed anatomy suitable for TAVR.   The patient was evaluated by the multidisciplinary valve team and felt to have severe, symptomatic aortic stenosis and to be a suitable candidate for TAVR, which was set up for 12/14/21.     Hospital  Course    Severe AS: s/p successful TAVR with a 23 mm Edwards Sapien 3 THV via the TF approach on 12/14/21. Post operative echo completed but pending formal read. Groin sites are stable. ECG with afib and no high grade heart block. Resumed on home Eliquis. Plan for discharge home today with cloes follow up in the office next week.  HTN: stable with no changes needed at this time.   Persistent atrial fibrillation: rates stable overall stable. Resumed on home dose Eliquis. Resume digoxin and lopressor  Hiatal hernia: pre TAVR CT showed a very large hiatal hernia with displacement of the thoracic aorta into the R chest. Tortuous course noted.    Consultants: None    _____________  Discharge Vitals Blood pressure (!) 133/59, pulse 89, temperature 98.3 F  (36.8 C), temperature source Oral, resp. rate 20, height 5' 4"  (1.626 m), weight 71.3 kg, SpO2 93 %.  Filed Weights   12/14/21 1148 12/15/21 0505  Weight: 70.3 kg 71.3 kg    GEN: Well nourished, well developed, in no acute distress HEENT: normal Neck: no JVD or masses Cardiac: irreg irreg; no murmurs, rubs, or gallops,no edema  Respiratory:  clear to auscultation bilaterally, normal work of breathing GI: soft, nontender, nondistended, + BS MS: no deformity or atrophy Skin: warm and dry, no rash.  Groin sites clear without hematoma or ecchymosis Neuro:  Alert and Oriented x 3, Strength and sensation are intact Psych: euthymic mood, full affect   Labs & Radiologic Studies    CBC Recent Labs    12/14/21 1640 12/15/21 0206  WBC  --  10.5  HGB 11.2* 11.6*  HCT 33.0* 34.3*  MCV  --  89.8  PLT  --  017   Basic Metabolic Panel Recent Labs    12/14/21 1640 12/15/21 0206  NA 142 140  K 2.8* 4.1  CL 102 107  CO2  --  23  GLUCOSE 119* 154*  BUN 12 12  CREATININE 0.70 0.70  CALCIUM  --  8.7*  MG  --  1.7   Liver Function Tests No results for input(s): "AST", "ALT", "ALKPHOS", "BILITOT", "PROT", "ALBUMIN" in the last 72 hours. No results for input(s): "LIPASE", "AMYLASE" in the last 72 hours. Cardiac Enzymes No results for input(s): "CKTOTAL", "CKMB", "CKMBINDEX", "TROPONINI" in the last 72 hours. BNP Invalid input(s): "POCBNP" D-Dimer No results for input(s): "DDIMER" in the last 72 hours. Hemoglobin A1C No results for input(s): "HGBA1C" in the last 72 hours. Fasting Lipid Panel No results for input(s): "CHOL", "HDL", "LDLCALC", "TRIG", "CHOLHDL", "LDLDIRECT" in the last 72 hours. Thyroid Function Tests No results for input(s): "TSH", "T4TOTAL", "T3FREE", "THYROIDAB" in the last 72 hours.  Invalid input(s): "FREET3" _____________  ECHOCARDIOGRAM LIMITED  Result Date: 12/14/2021    ECHOCARDIOGRAM LIMITED REPORT   Patient Name:   Autumn Moyer Date of Exam:  12/14/2021 Medical Rec #:  [id number]      Height:       64.0 in Accession #:    [id number]     Weight:       153.8 lb Date of Birth:  09-17-1940       BSA:          1.750 m Patient Age:    81 years       BP:           136/64 mmHg Patient Gender: F              HR:  95 bpm. Exam Location:  Inpatient Procedure: Limited Echo, Color Doppler and Cardiac Doppler Indications:     Aortic Stenosis i35.0  History:         Patient has prior history of Echocardiogram examinations, most                  recent 05/25/2021. Arrythmias:Atrial Fibrillation; Risk                  Factors:Hypertension.  Sonographer:     Raquel Sarna Senior RDCS Referring Phys:  [id number] Early Osmond Diagnosing Phys: Rudean Haskell MD  Sonographer Comments: 14m Edwards S3U TAVR Implanted IMPRESSIONS  1. Interventional TTE for TAVR procedure.  2. Prior to procedure, calcified aortic valve Severe low flow low gradient AS, with decreased stroke volume index, DVI 0.22, mean gradient 30 mmHg, Peak gradient 52 mm Hg, and mild aortic regurgitation (mixed valve disease).  3. Post procedure a 23 mm Sapien valve placed without PVL. Mean gradient 4 mm Hg, Peak gradient 8 mm Hg, DVI 0.65, EOA 2.03 cm2.  4. Echo findings are consistent with normal structure and function of the aortic valve prosthesis.  5. Left ventricular ejection fraction, by estimation, is 55 to 60%. The left ventricle has normal function.  6. Left atrial size was severely dilated.  7. Right atrial size was severely dilated.  8. No evidence of mitral valve regurgitation. FINDINGS  Left Ventricle: Left ventricular ejection fraction, by estimation, is 55 to 60%. The left ventricle has normal function. Left Atrium: Left atrial size was severely dilated. Right Atrium: Right atrial size was severely dilated. Pericardium: There is no evidence of pericardial effusion. Tricuspid Valve: Tricuspid valve regurgitation is trivial. Aortic Valve: Aortic valve mean gradient measures 3.5 mmHg. Aortic  valve peak gradient measures 6.7 mmHg. Aortic valve area, by VTI measures 2.23 cm. Echo findings are consistent with normal structure and function of the aortic valve prosthesis. LEFT VENTRICLE PLAX 2D LVOT diam:     1.80 cm LV SV:         51 LV SV Index:   29 LVOT Area:     2.54 cm  AORTIC VALVE AV Area (Vmax):    1.83 cm AV Area (Vmean):   1.95 cm AV Area (VTI):     2.23 cm AV Vmax:           129.00 cm/s AV Vmean:          88.200 cm/s AV VTI:            0.231 m AV Peak Grad:      6.7 mmHg AV Mean Grad:      3.5 mmHg LVOT Vmax:         92.70 cm/s LVOT Vmean:        67.700 cm/s LVOT VTI:          0.202 m LVOT/AV VTI ratio: 0.87 TRICUSPID VALVE TR Peak grad:   32.5 mmHg TR Vmax:        285.00 cm/s  SHUNTS Systemic VTI:  0.20 m Systemic Diam: 1.80 cm MRudean HaskellMD Electronically signed by MRudean HaskellMD Signature Date/Time: 12/14/2021/4:59:33 PM    Final    Structural Heart Procedure  Result Date: 12/14/2021 See surgical note for result.  DG Chest 2 View  Result Date: 12/13/2021 CLINICAL DATA:  Preoperative exam. EXAM: CHEST - 2 VIEW COMPARISON:  Chest radiograph May 31, 2019 FINDINGS: Stable cardiac and mediastinal contours. Large bowel containing there is a large hiatal hernia containing  bowel within the left hemithorax. Bibasilar atelectasis. No pleural effusion or pneumothorax. Scoliotic curvature of the thoracolumbar spine. IMPRESSION: No active cardiopulmonary disease. Electronically Signed   By: Lovey Newcomer M.D.   On: 12/13/2021 08:29   MM 3D SCREEN BREAST BILATERAL  Result Date: 12/01/2021 CLINICAL DATA:  Screening. EXAM: DIGITAL SCREENING BILATERAL MAMMOGRAM WITH TOMOSYNTHESIS AND CAD TECHNIQUE: Bilateral screening digital craniocaudal and mediolateral oblique mammograms were obtained. Bilateral screening digital breast tomosynthesis was performed. The images were evaluated with computer-aided detection. COMPARISON:  Previous exam(s). ACR Breast Density Category b: There  are scattered areas of fibroglandular density. FINDINGS: There are no findings suspicious for malignancy. IMPRESSION: No mammographic evidence of malignancy. A result letter of this screening mammogram will be mailed directly to the patient. RECOMMENDATION: Screening mammogram in one year. (Code:SM-B-01Y) BI-RADS CATEGORY  1: Negative. Electronically Signed   By: Lajean Manes M.D.   On: 12/01/2021 08:20    Disposition   Pt is being discharged home today in good condition.  Follow-up Plans & Appointments     Follow-up Information     Eileen Stanford, PA-C. Go on 12/22/2021.   Specialties: Cardiology, Radiology Why: @ 3:30pm, please arrive at least 10 minutes early. Contact information: [place] [id number] [phone]                Discharge Instructions     Amb Referral to Cardiac Rehabilitation   Complete by: As directed    Diagnosis: Valve Replacement   Valve: Aortic   After initial evaluation and assessments completed: Virtual Based Care may be provided alone or in conjunction with Phase 2 Cardiac Rehab based on patient barriers.: Yes   Intensive Cardiac Rehabilitation (ICR) [place] location only OR Traditional Cardiac Rehabilitation (TCR) *If criteria for ICR are not met will enroll in TCR [facility] only): Yes       Discharge Medications   Allergies as of 12/15/2021       Reactions   Noroxin [norfloxacin] Nausea And Vomiting   Augmentin [amoxicillin-pot Clavulanate] Other (See Comments)   Gums swelling, lips numb        Medication List     TAKE these medications    acetaminophen 500 MG tablet Commonly known as: TYLENOL Take 1,000 mg by mouth daily.   apixaban 5 MG Tabs tablet Commonly known as: Eliquis Take 1 tablet (5 mg total) by mouth 2 (two) times daily.   benazepril-hydrochlorthiazide 20-25 MG tablet Commonly known as: LOTENSIN HCT Take 1 tablet by mouth daily.   digoxin 0.125 MG tablet Commonly known as:  LANOXIN Take 1 tablet (0.125 mg total) by mouth daily.   furosemide 20 MG tablet Commonly known as: LASIX Take 20 mg by mouth daily as needed for edema or fluid.   levocetirizine 5 MG tablet Commonly known as: XYZAL Take 1 tablet (5 mg total) by mouth every evening.   levothyroxine 88 MCG tablet Commonly known as: Synthroid Take 1 tablet (88 mcg total) by mouth daily. What changed: when to take this   metoprolol tartrate 25 MG tablet Commonly known as: LOPRESSOR Take 1.5 tablets (37.5 mg total) by mouth 2 (two) times daily.   mometasone 0.1 % cream Commonly known as: ELOCON Apply 1 application topically daily. What changed:  when to take this reasons to take this   multivitamin capsule Take 1 capsule by mouth daily.   pantoprazole 40 MG tablet Commonly known as: PROTONIX Take 1 tablet (40 mg total) by mouth daily.  Refresh 1.4-0.6 % Soln Generic drug: Polyvinyl Alcohol-Povidone PF Place 2 drops into both eyes every morning.   tacrolimus 0.1 % ointment Commonly known as: PROTOPIC Apply topically in the morning and at bedtime. What changed:  how much to take when to take this additional instructions          Outstanding Labs/Studies   none  Duration of Discharge Encounter   Greater than 30 minutes including physician time.  Signed, Angelena Form, PA-C 12/15/2021, 10:26 AM [phone]   ATTENDING ATTESTATION:  After conducting a review of all available clinical information with the care team, interviewing the patient, and performing a physical exam, I agree with the findings and plan described in this note.   GEN: No acute distress.   HEENT:  MMM, no JVD, no scleral icterus Cardiac: RRR, no murmurs, rubs, or gallops.  Respiratory: Clear to auscultation bilaterally. GI: Soft, nontender, non-distended  MS: No edema; No deformity. Neuro:  Nonfocal  Vasc:  +2 radial pulses  Patient is doing well after uncomplicated transcatheter aortic valve  replacement with a 23 mm SAPIEN 3 valve.  Discharge today with follow-up as scheduled.  Lenna Sciara, MD Pager [phone]

## 2021-12-14 NOTE — Progress Notes (Deleted)
No change in Echo per Dr.Cooper. Patient states still having some uncomfortable chest discomfort. Giving Pain meds at this time

## 2021-12-14 NOTE — Progress Notes (Deleted)
Report Received from [facility]. Patient resting at this time. Sheath removed from Rt Radial Artery. Manual Pressure held for 10 mins. Hemostasis achieved. 4x4 and tegaderm applied to Rt Radial site. Will continue to monitor patient.

## 2021-12-15 ENCOUNTER — Encounter (HOSPITAL_COMMUNITY): Payer: Self-pay | Admitting: Internal Medicine

## 2021-12-15 ENCOUNTER — Inpatient Hospital Stay (HOSPITAL_COMMUNITY): Payer: PPO

## 2021-12-15 ENCOUNTER — Other Ambulatory Visit (HOSPITAL_COMMUNITY): Payer: PPO

## 2021-12-15 DIAGNOSIS — Z952 Presence of prosthetic heart valve: Secondary | ICD-10-CM

## 2021-12-15 LAB — ECHOCARDIOGRAM COMPLETE
AR max vel: 2.18 cm2
AV Area VTI: 2.44 cm2
AV Area mean vel: 2.23 cm2
AV Mean grad: 9 mmHg
AV Peak grad: 17 mmHg
Ao pk vel: 2.06 m/s
Area-P 1/2: 4.96 cm2
Height: 64 in
S' Lateral: 2.7 cm
Weight: 2513.6 oz

## 2021-12-15 LAB — CBC
HCT: 34.3 % — ABNORMAL LOW (ref 36.0–46.0)
Hemoglobin: 11.6 g/dL — ABNORMAL LOW (ref 12.0–15.0)
MCH: 30.4 pg (ref 26.0–34.0)
MCHC: 33.8 g/dL (ref 30.0–36.0)
MCV: 89.8 fL (ref 80.0–100.0)
Platelets: 215 10*3/uL (ref 150–400)
RBC: 3.82 MIL/uL — ABNORMAL LOW (ref 3.87–5.11)
RDW: 12.7 % (ref 11.5–15.5)
WBC: 10.5 10*3/uL (ref 4.0–10.5)
nRBC: 0 % (ref 0.0–0.2)

## 2021-12-15 LAB — BASIC METABOLIC PANEL
Anion gap: 10 (ref 5–15)
BUN: 12 mg/dL (ref 8–23)
CO2: 23 mmol/L (ref 22–32)
Calcium: 8.7 mg/dL — ABNORMAL LOW (ref 8.9–10.3)
Chloride: 107 mmol/L (ref 98–111)
Creatinine, Ser: 0.7 mg/dL (ref 0.44–1.00)
GFR, Estimated: >60 mL/min (ref >60)
Glucose, Bld: 154 mg/dL — ABNORMAL HIGH (ref 70–99)
Potassium: 4.1 mmol/L (ref 3.5–5.1)
Sodium: 140 mmol/L (ref 135–145)

## 2021-12-15 LAB — MAGNESIUM: Magnesium: 1.7 mg/dL (ref 1.7–2.4)

## 2021-12-15 MED FILL — Heparin Sodium (Porcine) Inj 1000 Unit/ML: Qty: 1000 | Status: AC

## 2021-12-15 MED FILL — Potassium Chloride Inj 2 mEq/ML: INTRAVENOUS | Qty: 40 | Status: AC

## 2021-12-15 MED FILL — Magnesium Sulfate Inj 50%: INTRAMUSCULAR | Qty: 10 | Status: AC

## 2021-12-15 NOTE — Progress Notes (Signed)
  Echocardiogram 2D Echocardiogram has been performed.  Bobbye Charleston 12/15/2021, 8:46 AM

## 2021-12-15 NOTE — Progress Notes (Signed)
CARDIAC REHAB PHASE I     Pt resting in bed eating breakfast with family at bedside. Pt is feeling good this morning.She reports being up out of bed several times this morning ambulating in room with no difficulties. Home teaching including risk factors, restrictions, heart healthy diet, site care, exercise guidelines, and CRP2 reviewed. Will refer to AP for CRP2. All questions and concerns addressed. Plan for home today .    [phone] Vanessa Barbara, RN BSN 12/15/2021 9:41 AM

## 2021-12-15 NOTE — TOC Progression Note (Signed)
Transition of Care (TOC) - Progression Note  Marvetta Gibbons RN, BSN Transitions of Care Unit 4E- RN Case Manager See Treatment Team for direct phone #    Patient Details  Name: Autumn Moyer MRN: [id number] Date of Birth: 11-Sep-1940  Transition of Care [facility]) CM/SW Contact  Dahlia Client, Romeo Rabon, RN Phone Number: 12/15/2021, 10:42 AM  Clinical Narrative:    Transition of Care Department [facility]) has reviewed patient and no TOC needs have been identified at this time.  Pt stable for transition home today s/p TAVR   Expected Discharge Plan: Home/Self Care Barriers to Discharge: No Barriers Identified  Expected Discharge Plan and Services Expected Discharge Plan: Home/Self Care         Expected Discharge Date: 12/15/21               DME Arranged: N/A DME Agency: NA       HH Arranged: NA HH Agency: NA         Social Determinants of Health (SDOH) Interventions    Readmission Risk Interventions    12/15/2021   10:41 AM  Readmission Risk Prevention Plan  Post Dischage Appt Complete  Medication Screening Complete  Transportation Screening Complete

## 2021-12-15 NOTE — Progress Notes (Signed)
Order received to discharge patient.  Telemetry monitor removed and CCMD notified.  PIV access x2 removed.  Discharge instructions, follow up, medications and instructions for their use discussed with patient. 

## 2021-12-16 ENCOUNTER — Telehealth: Payer: Self-pay | Admitting: Physician Assistant

## 2021-12-16 NOTE — Telephone Encounter (Signed)
  [place] VALVE TEAM   Patient contacted regarding discharge from [facility] on 9/27  Patient understands to follow up with a structural heart APP on 10/4 @ 3:30pm at [place].  Patient understands discharge instructions? yes Patient understands medications and regimen? yes Patient understands to bring all medications to this visit? yes  Angelena Form PA-C  [facility]

## 2021-12-17 ENCOUNTER — Ambulatory Visit (INDEPENDENT_AMBULATORY_CARE_PROVIDER_SITE_OTHER): Payer: PPO | Admitting: Family

## 2021-12-17 ENCOUNTER — Encounter: Payer: Self-pay | Admitting: Family

## 2021-12-17 VITALS — BP 125/70 | HR 68 | Temp 97.5°F | Ht 64.0 in | Wt 156.0 lb

## 2021-12-17 DIAGNOSIS — Z952 Presence of prosthetic heart valve: Secondary | ICD-10-CM | POA: Diagnosis not present

## 2021-12-17 DIAGNOSIS — E663 Overweight: Secondary | ICD-10-CM

## 2021-12-17 DIAGNOSIS — D509 Iron deficiency anemia, unspecified: Secondary | ICD-10-CM | POA: Diagnosis not present

## 2021-12-17 DIAGNOSIS — Z23 Encounter for immunization: Secondary | ICD-10-CM

## 2021-12-17 DIAGNOSIS — K219 Gastro-esophageal reflux disease without esophagitis: Secondary | ICD-10-CM | POA: Diagnosis not present

## 2021-12-17 DIAGNOSIS — I4891 Unspecified atrial fibrillation: Secondary | ICD-10-CM | POA: Diagnosis not present

## 2021-12-17 DIAGNOSIS — I10 Essential (primary) hypertension: Secondary | ICD-10-CM | POA: Diagnosis not present

## 2021-12-17 DIAGNOSIS — I350 Nonrheumatic aortic (valve) stenosis: Secondary | ICD-10-CM

## 2021-12-17 DIAGNOSIS — Z7901 Long term (current) use of anticoagulants: Secondary | ICD-10-CM | POA: Diagnosis not present

## 2021-12-17 DIAGNOSIS — J309 Allergic rhinitis, unspecified: Secondary | ICD-10-CM | POA: Diagnosis not present

## 2021-12-17 DIAGNOSIS — E039 Hypothyroidism, unspecified: Secondary | ICD-10-CM

## 2021-12-17 MED ORDER — BENAZEPRIL-HYDROCHLOROTHIAZIDE 20-25 MG PO TABS: 1.0000 | ORAL_TABLET | Freq: Every day | ORAL | 1 refills | Status: DC

## 2021-12-17 MED ORDER — PANTOPRAZOLE SODIUM 40 MG PO TBEC: 40.0000 mg | DELAYED_RELEASE_TABLET | Freq: Every day | ORAL | 1 refills | Status: DC

## 2021-12-17 MED ORDER — APIXABAN 5 MG PO TABS: 5.0000 mg | ORAL_TABLET | Freq: Two times a day (BID) | ORAL | 1 refills | Status: DC

## 2021-12-17 MED ORDER — LEVOCETIRIZINE DIHYDROCHLORIDE 5 MG PO TABS: 5.0000 mg | ORAL_TABLET | Freq: Every evening | ORAL | 1 refills | Status: DC

## 2021-12-17 NOTE — Progress Notes (Signed)
Subjective:    Patient ID: Autumn Moyer, female    DOB: Jan 28, 1941, 81 y.o.   MRN: [id number]  Chief Complaint  Patient presents with   Medical Management of Chronic Issues    Heart surgery Tuesday. Flu shot today    Pt presents to the offic today for chronic follow up. She is followed by Cardiologists  for A Fib, Aortic stenosis and gets an Echo each year. She is currently on Eliquis 5 mg BID.   She had aortic valve replacement on 12/14/21. Hypertension This is a chronic problem. The current episode started more than 1 year ago. The problem has been resolved since onset. The problem is controlled. Associated symptoms include peripheral edema (slight). Pertinent negatives include no malaise/fatigue or shortness of breath. Risk factors for coronary artery disease include dyslipidemia, obesity and sedentary lifestyle. Past treatments include beta blockers, ACE inhibitors and diuretics. The current treatment provides moderate improvement. Identifiable causes of hypertension include a thyroid problem.  Gastroesophageal Reflux She complains of belching and heartburn. This is a chronic problem. The current episode started more than 1 year ago. The problem occurs occasionally. Pertinent negatives include no fatigue. Risk factors include obesity. She has tried a PPI for the symptoms. The treatment provided moderate relief.  Thyroid Problem Presents for follow-up visit. Patient reports no anxiety, constipation, depressed mood, dry skin or fatigue. The symptoms have been stable.  Anemia Presents for follow-up visit. Symptoms include leg swelling. There has been no bruising/bleeding easily or malaise/fatigue.      Review of Systems  Constitutional:  Negative for fatigue and malaise/fatigue.  Respiratory:  Negative for shortness of breath.   Gastrointestinal:  Positive for heartburn. Negative for constipation.  Hematological:  Does not bruise/bleed easily.  Psychiatric/Behavioral:  The patient is  not nervous/anxious.   All other systems reviewed and are negative.      Objective:   Physical Exam Vitals reviewed.  Constitutional:      General: She is not in acute distress.    Appearance: She is well-developed.  HENT:     Head: Normocephalic and atraumatic.     Right Ear: Tympanic membrane normal.     Left Ear: Tympanic membrane normal.  Eyes:     Pupils: Pupils are equal, round, and reactive to light.  Neck:     Thyroid: No thyromegaly.  Cardiovascular:     Rate and Rhythm: Normal rate and regular rhythm.     Heart sounds: Normal heart sounds. No murmur heard. Pulmonary:     Effort: Pulmonary effort is normal. No respiratory distress.     Breath sounds: Normal breath sounds. No wheezing.  Abdominal:     General: Bowel sounds are normal. There is no distension.     Palpations: Abdomen is soft.     Tenderness: There is no abdominal tenderness.  Musculoskeletal:        General: No tenderness. Normal range of motion.     Cervical back: Normal range of motion and neck supple.  Skin:    General: Skin is warm and dry.  Neurological:     Mental Status: She is alert and oriented to person, place, and time.     Cranial Nerves: No cranial nerve deficit.     Deep Tendon Reflexes: Reflexes are normal and symmetric.  Psychiatric:        Behavior: Behavior normal.        Thought Content: Thought content normal.        Judgment: Judgment normal.  BP 125/70   Pulse 68   Temp (!) 97.5 F (36.4 C) (Temporal)   Ht '5\' 4"'$  (1.626 m)   Wt 156 lb (70.8 kg)   SpO2 95%   BMI 26.78 kg/m      Assessment & Plan:  Autumn Moyer comes in today with chief complaint of Medical Management of Chronic Issues (Heart surgery Tuesday. Flu shot today )   Diagnosis and orders addressed:  1. Allergic rhinitis, unspecified seasonality, unspecified trigger - levocetirizine (XYZAL) 5 MG tablet; Take 1 tablet (5 mg total) by mouth every evening.  Dispense: 90 tablet; Refill: 1  2.  Atrial fibrillation, unspecified type (HCC) - apixaban (ELIQUIS) 5 MG TABS tablet; Take 1 tablet (5 mg total) by mouth 2 (two) times daily.  Dispense: 180 tablet; Refill: 1  3. Essential hypertension - benazepril-hydrochlorthiazide (LOTENSIN HCT) 20-25 MG tablet; Take 1 tablet by mouth daily.  Dispense: 90 tablet; Refill: 1  4. Gastroesophageal reflux disease, unspecified whether esophagitis present - pantoprazole (PROTONIX) 40 MG tablet; Take 1 tablet (40 mg total) by mouth daily.  Dispense: 90 tablet; Refill: 1  5. Essential hypertension, benign  6. Severe aortic stenosis  7. Hypothyroidism, unspecified type  8. Iron deficiency anemia, unspecified iron deficiency anemia type  9. Need for immunization against influenza - Flu Vaccine QUAD High Dose(Fluad)  10. Overweight (BMI 25.0-29.9)  11. Long term current use of anticoagulant therapy  12. S/P TAVR (transcatheter aortic valve replacement)   Labs pending Health Maintenance reviewed Diet and exercise encouraged  Follow up plan: 6 months    Evelina Dun, FNP

## 2021-12-17 NOTE — Patient Instructions (Signed)
Health Maintenance After Age 79 After age 79, you are at a higher risk for certain long-term diseases and infections as well as injuries from falls. Falls are a major cause of broken bones and head injuries in people who are older than age 79. Getting regular preventive care can help to keep you healthy and well. Preventive care includes getting regular testing and making lifestyle changes as recommended by your health care provider. Talk with your health care provider about: Which screenings and tests you should have. A screening is a test that checks for a disease when you have no symptoms. A diet and exercise plan that is right for you. What should I know about screenings and tests to prevent falls? Screening and testing are the best ways to find a health problem early. Early diagnosis and treatment give you the best chance of managing medical conditions that are common after age 79. Certain conditions and lifestyle choices may make you more likely to have a fall. Your health care provider may recommend: Regular vision checks. Poor vision and conditions such as cataracts can make you more likely to have a fall. If you wear glasses, make sure to get your prescription updated if your vision changes. Medicine review. Work with your health care provider to regularly review all of the medicines you are taking, including over-the-counter medicines. Ask your health care provider about any side effects that may make you more likely to have a fall. Tell your health care provider if any medicines that you take make you feel dizzy or sleepy. Strength and balance checks. Your health care provider may recommend certain tests to check your strength and balance while standing, walking, or changing positions. Foot health exam. Foot pain and numbness, as well as not wearing proper footwear, can make you more likely to have a fall. Screenings, including: Osteoporosis screening. Osteoporosis is a condition that causes  the bones to get weaker and break more easily. Blood pressure screening. Blood pressure changes and medicines to control blood pressure can make you feel dizzy. Depression screening. You may be more likely to have a fall if you have a fear of falling, feel depressed, or feel unable to do activities that you used to do. Alcohol use screening. Using too much alcohol can affect your balance and may make you more likely to have a fall. Follow these instructions at home: Lifestyle Do not drink alcohol if: Your health care provider tells you not to drink. If you drink alcohol: Limit how much you have to: 0-1 drink a day for women. 0-2 drinks a day for men. Know how much alcohol is in your drink. In the [place] one drink equals one 12 oz bottle of beer (355 mL), one 5 oz glass of wine (148 mL), or one 1 oz glass of hard liquor (44 mL). Do not use any products that contain nicotine or tobacco. These products include cigarettes, chewing tobacco, and vaping devices, such as e-cigarettes. If you need help quitting, ask your health care provider. Activity  Follow a regular exercise program to stay fit. This will help you maintain your balance. Ask your health care provider what types of exercise are appropriate for you. If you need a cane or walker, use it as recommended by your health care provider. Wear supportive shoes that have nonskid soles. Safety  Remove any tripping hazards, such as rugs, cords, and clutter. Install safety equipment such as grab bars in bathrooms and safety rails on stairs. Keep rooms and walkways   well-lit. General instructions Talk with your health care provider about your risks for falling. Tell your health care provider if: You fall. Be sure to tell your health care provider about all falls, even ones that seem minor. You feel dizzy, tiredness (fatigue), or off-balance. Take over-the-counter and prescription medicines only as told by your health care provider. These include  supplements. Eat a healthy diet and maintain a healthy weight. A healthy diet includes low-fat dairy products, low-fat (lean) meats, and fiber from whole grains, beans, and lots of fruits and vegetables. Stay current with your vaccines. Schedule regular health, dental, and eye exams. Summary Having a healthy lifestyle and getting preventive care can help to protect your health and wellness after age 79. Screening and testing are the best way to find a health problem early and help you avoid having a fall. Early diagnosis and treatment give you the best chance for managing medical conditions that are more common for people who are older than age 79. Falls are a major cause of broken bones and head injuries in people who are older than age 79. Take precautions to prevent a fall at home. Work with your health care provider to learn what changes you can make to improve your health and wellness and to prevent falls. This information is not intended to replace advice given to you by your health care provider. Make sure you discuss any questions you have with your health care provider. Document Revised: 07/27/2020 Document Reviewed: 07/27/2020 Elsevier Patient Education  2023 Elsevier Inc.  

## 2021-12-21 NOTE — Progress Notes (Unsigned)
HEART AND Autumn Moyer                                     Cardiology Office Note:    Date:  12/22/2021   ID:  Autumn Moyer, DOB [birth year], MRN [id number]  PCP:  Autumn Moyer, [facility] Cardiologist:  Autumn Breeding, MD / Dr. Ali Lowe, MD & Dr. Lavonna Monarch, MD (TAVR) [facility] Electrophysiologist:  None   Referring MD: Autumn Balloon, FNP   [facility] s/p TAVR  History of Present Illness:    Autumn Moyer is a 81 y.o. female with a hx of hypertension, paroxysmal atrial fibrillation on anticoagulation, hypothyroidism, and severe aortic stenosis s/p TAVR (12/14/21) who presents to clinic for follow up.   [name] has been followed with periodic echocardiograms by Dr. Percival Moyer since 2016. In 03/2020 echocardiogram showed moderate aortic valve stenosis with an AVA by VTI measuring 1.24 cm and mean gradient 26.8 mmHg. She was then diagnosed with atrial fibrillation and was treated with anticoagulation and rate control  medications. On follow up, she was having SOB and was set up for a [place] which was negative for ischemia or infarct. Her symptoms continued therefore repeat echo 05/2021 showed worsening AS with a mean gradient at 38 mmHg with an AVA at 0.5cm2. She was then referred to the Structural Heart team for further evaluation and possible TAVR. She underwent R/LHC which showed mild obstructive CAD with a proximal LAD lesion at 20% stenosis.    She was evaluated by the multidisciplinary valve team and underwent successful TAVR with a 23 mm Edwards Sapien 3 THV via the TF approach on 12/14/21. Post operative echo showed EF 60%, normally functioning TAVR with a mean gradient of 9 mmHg and no PVL as well as mild MR and moderate pulm HTN. She was discharged on home Eliquis.   Today the patient presents to clinic for follow up. Here alone. No CP or SOB. Had some transient LE edema that now resolved. Has not taken any of her PRN  lasix. No orthopnea or PND. No dizziness or syncope. No blood in stool or urine. No palpitations. Feeling better a little bit everyday. Walking up to 10 minutes a day.   Past Medical History:  Diagnosis Date   Atrial fibrillation, persistent (HCC)    Complication of anesthesia    Hiatal hernia    Hypertension    PONV (postoperative nausea and vomiting)    S/P TAVR (transcatheter aortic valve replacement) 12/14/2021   s/p TAVR with a 9m Edwards S3UR via the TF approach by Dr. TAli Moyer& Dr. WLavonna Moyer  Severe aortic stenosis    Squamous cell carcinoma of skin 01/16/2018   in situ-right knee post (txpbx)   Thyroid disease     Past Surgical History:  Procedure Laterality Date   ABDOMINAL AORTOGRAM N/A 09/22/2021   Procedure: ABDOMINAL AORTOGRAM;  Surgeon: TEarly Osmond MD;  [facility];  Service: Cardiovascular;  Laterality: N/A;   CARDIAC CATHETERIZATION     EYE SURGERY     INTRAOPERATIVE TRANSTHORACIC ECHOCARDIOGRAM N/A 12/14/2021   Procedure: INTRAOPERATIVE TRANSTHORACIC ECHOCARDIOGRAM;  Surgeon: TEarly Osmond MD;  [facility]  Service: Open Heart Surgery;  Laterality: N/A;   MULTIPLE EXTRACTIONS WITH ALVEOLOPLASTY N/A 12/02/2021   Procedure: MULTIPLE EXTRACTION WITH ALVEOLOPLASTY;  Surgeon: OCharlaine Dalton DMD;  Location:  [place] [facility];  Service: Dentistry;  Laterality: N/A;   RIGHT HEART CATH AND CORONARY/GRAFT ANGIOGRAPHY N/A 09/22/2021   Procedure: RIGHT HEART CATH AND CORONARY/GRAFT ANGIOGRAPHY;  Surgeon: Early Osmond, MD;  Location: [facility];  Service: Cardiovascular;  Laterality: N/A;   TOE SURGERY     TRANSCATHETER AORTIC VALVE REPLACEMENT, TRANSFEMORAL N/A 12/14/2021   Procedure: Transcatheter Aortic Valve Replacement, Transfemoral using Edwards 23 MM SAPIEN 3 Ultra;  Surgeon: Early Osmond, MD;  Location: [facility];  Service: Open Heart Surgery;  Laterality: N/A;  Transfemoral approach   TUBAL LIGATION      Current Medications: Current  Meds  Medication Sig   acetaminophen (TYLENOL) 500 MG tablet Take 1,000 mg by mouth daily.   apixaban (ELIQUIS) 5 MG TABS tablet Take 1 tablet (5 mg total) by mouth 2 (two) times daily.   azithromycin (ZITHROMAX) 500 MG tablet Take 1 tablet by mouth 1 hour before dental cleanings and procedures   benazepril-hydrochlorthiazide (LOTENSIN HCT) 20-25 MG tablet Take 1 tablet by mouth daily.   digoxin (LANOXIN) 0.125 MG tablet Take 1 tablet (0.125 mg total) by mouth daily.   furosemide (LASIX) 20 MG tablet Take 20 mg by mouth daily as needed for edema or fluid.   levocetirizine (XYZAL) 5 MG tablet Take 1 tablet (5 mg total) by mouth every evening.   levothyroxine (SYNTHROID) 88 MCG tablet Take 1 tablet (88 mcg total) by mouth daily. (Patient taking differently: Take 88 mcg by mouth daily before breakfast.)   metoprolol tartrate (LOPRESSOR) 25 MG tablet Take 1.5 tablets (37.5 mg total) by mouth 2 (two) times daily.   mometasone (ELOCON) 0.1 % cream Apply 1 application topically daily. (Patient taking differently: Apply 1 application  topically daily as needed (in ears).)   Multiple Vitamin (MULTIVITAMIN) capsule Take 1 capsule by mouth daily.   pantoprazole (PROTONIX) 40 MG tablet Take 1 tablet (40 mg total) by mouth daily.   Polyvinyl Alcohol-Povidone PF (REFRESH) 1.4-0.6 % SOLN Place 2 drops into both eyes every morning.   tacrolimus (PROTOPIC) 0.1 % ointment Apply topically in the morning and at bedtime. (Patient taking differently: Apply 1 Application topically daily. Eye lids)     Allergies:   Noroxin [norfloxacin] and Augmentin [amoxicillin-pot clavulanate]   Social History   Socioeconomic History   Marital status: Married    Spouse name: Autumn Moyer   Number of children: 2   Years of education: Not on file   Highest education level: Not on file  Occupational History   Not on file  Tobacco Use   Smoking status: Never    Passive exposure: Past   Smokeless tobacco: Never  Vaping Use    Vaping Use: Never used  Substance and Sexual Activity   Alcohol use: No    Alcohol/week: 0.0 standard drinks of alcohol   Drug use: No   Sexual activity: Not Currently    Partners: Male  Other Topics Concern   Not on file  Social History Narrative   Lives with husband.  Married x 56 years in 2022.   2 daughters   5 grand children   1 great grandchild.   Social Determinants of Health   Financial Resource Strain: Low Risk  (02/03/2021)   Overall Financial Resource Strain (CARDIA)    Difficulty of Paying Living Expenses: Not hard at all  Food Insecurity: No Food Insecurity (12/14/2021)   Hunger Vital Sign    Worried About Running Out of Food in the Last Year: Never true  Ran Out of Food in the Last Year: Never true  Transportation Needs: No Transportation Needs (12/14/2021)   PRAPARE - Hydrologist (Medical): No    Lack of Transportation (Non-Medical): No  Physical Activity: Insufficiently Active (02/03/2021)   Exercise Vital Sign    Days of Exercise per Week: 3 days    Minutes of Exercise per Session: 20 min  Stress: No Stress Concern Present (02/03/2021)   [place]uda    Feeling of Stress : Not at all  Social Connections: [place] (02/03/2021)   Social Connection and Isolation Panel [NHANES]    Frequency of Communication with Friends and Family: More than three times a week    Frequency of Social Gatherings with Friends and Family: More than three times a week    Attends Religious Services: More than 4 times per year    Active Member of [employer] or Organizations: Yes    Attends Music therapist: More than 4 times per year    Marital Status: Married     Family History: The patient's family history includes Breast cancer in her daughter and paternal aunt; Heart disease in her father; Heart failure in her mother; Hyperlipidemia in her father and mother;  Hypertension in her father, mother, and sister; Lung cancer in her father.  ROS:   Please see the history of present illness.    All other systems reviewed and are negative.  EKGs/Labs/Other Studies Reviewed:    The following studies were reviewed today:  TAVR OPERATIVE NOTE     Date of Procedure:                12/14/2021   Preoperative Diagnosis:      Severe Aortic Stenosis    Postoperative Diagnosis:    Same    Procedure:        Transcatheter Aortic Valve Replacement - Transfemoral Approach             Edwards Sapien 3 Resilia THV (size 23 mm, model # 9755RLS, serial # [id number])              Co-Surgeons:                         Gaye Pollack, MD and Lenna Sciara, MD Anesthesiologist:                  Renold Don, MD   Echocardiographer:              Rudean Haskell, MD   Pre-operative Echo Findings: Severe aortic stenosis Normal left ventricular systolic function   Post-operative Echo Findings: No paravalvular leak Normal left ventricular systolic function   Left Heart Catheterization Findings: Left ventricular end-diastolic pressure of 60VPXT _____________     Echo 12/15/21: IMPRESSIONS   1. The aortic valve has been replaced with a 23 mm Sapien Valve. Aortic  valve regurgitation is not visualized. Effective orifice area, by VTI  measures 2.44 cm. Aortic valve mean gradient measures 9.0 mmHg. Aortic  valve Vmax measures 2.06 m/s. DVI 0.64.   Peak gradient 14 mm Hg.   2. Left ventricular ejection fraction, by estimation, is 60 to 65%. The  left ventricle has normal function. The left ventricle has no regional  wall motion abnormalities. Left ventricular diastolic parameters are  indeterminate.   3. Right ventricular systolic function is normal. The right ventricular  size is normal. There  is moderately elevated pulmonary artery systolic  pressure. The estimated right ventricular systolic pressure is 02.7 mmHg.   4. Left atrial size was severely  dilated.   5. Right atrial size was severely dilated.   6. The mitral valve is degenerative. Mild mitral valve regurgitation. The  mean mitral valve gradient is 2.0 mmHg with average heart rate of 82 bpm.  Moderate mitral annular calcification.   7. Tricuspid valve regurgitation is moderate.   Comparison(s): Echo findings are consistent with normal structure and  function of the aortic valve prosthesis.   EKG:  EKG is ordered today.  The ekg ordered today demonstrates afib with CVR, HR 69  Recent Labs: 05/27/2021: TSH 2.030 12/10/2021: ALT 17 12/15/2021: BUN 12; Creatinine, Ser 0.70; Hemoglobin 11.6; Magnesium 1.7; Platelets 215; Potassium 4.1; Sodium 140  Recent Lipid Panel No results found for: "CHOL", "TRIG", "HDL", "CHOLHDL", "VLDL", "LDLCALC", "LDLDIRECT"   Risk Assessment/Calculations:    CHA2DS2-VASc Score = 5   This indicates a 7.2% annual risk of stroke. The patient's score is based upon: CHF History: 1 HTN History: 1 Diabetes History: 0 Stroke History: 0 Vascular Disease History: 0 Age Score: 2 Gender Score: 1      Physical Exam:    VS:  BP 126/76   Pulse 69   Ht '5\' 4"'$  (1.626 m)   Wt 155 lb 12.8 oz (70.7 kg)   SpO2 96%   BMI 26.74 kg/m     Wt Readings from Last 3 Encounters:  12/22/21 155 lb 12.8 oz (70.7 kg)  12/17/21 156 lb (70.8 kg)  12/15/21 157 lb 1.6 oz (71.3 kg)     GEN:  Well nourished, well developed in no acute distress HEENT: Normal NECK: No JVD LYMPHATICS: No lymphadenopathy CARDIAC: irreg irreg, no murmurs, rubs, gallops RESPIRATORY:  Clear to auscultation without rales, wheezing or rhonchi  ABDOMEN: Soft, non-tender, non-distended MUSCULOSKELETAL:  No edema; No deformity  SKIN: Warm and dry.  Groin sites clear without hematoma or ecchymosis  NEUROLOGIC:  Alert and oriented x 3 PSYCHIATRIC:  Normal affect   ASSESSMENT:    1. S/P TAVR (transcatheter aortic valve replacement)   2. Essential hypertension   3. Longstanding persistent  atrial fibrillation ([place])   4. Hiatal hernia    PLAN:    In order of problems listed above:  Severe AS s/p TAVR: doing great 1 week out from TAVR. ECG with no HAVB. Groin sites healing well. SBE prophylaxis discussed; I have RX'd azithromycin due to a PCN allergy. Continue on Eliquis. I will see him back for 1 month follow up and echo.    HTN: Bp well controlled. No changes made.    Persistent atrial fibrillation: rates stable overall stable. Continue on Eliquis, digoxin and lopressor   Hiatal hernia: pre TAVR CT showed a very large hiatal hernia with displacement of the thoracic aorta into the R chest. Tortuous course noted. Pt asked if this would need to be fixed. I think as long and she is doing okay she would not need any intervention.      Cardiac Rehabilitation Eligibility Assessment  The patient is ready to start cardiac rehabilitation from a cardiac standpoint.         Medication Adjustments/Labs and Tests Ordered: Current medicines are reviewed at length with the patient today.  Concerns regarding medicines are outlined above.  Orders Placed This Encounter  Procedures   EKG 12-Lead   Meds ordered this encounter  Medications   azithromycin (ZITHROMAX) 500 MG tablet  Sig: Take 1 tablet by mouth 1 hour before dental cleanings and procedures    Dispense:  12 tablet    Refill:  6    Patient Instructions  Medication Instructions:  Start Azithromycin 500 mg, take 1 tablet by mouth 1 hour before dental appointments and cleanings    *If you need a refill on your cardiac medications before your next appointment, please call your pharmacy*   Lab Work: None ordered   If you have labs (blood work) drawn today and your tests are completely normal, you will receive your results only by: MyChart Message (if you have MyChart) OR A paper copy in the mail If you have any lab test that is abnormal or we need to change your treatment, we will call you to review the  results.   Testing/Procedures: None ordered today    Follow-Up: Follow up as scheduled   Other Instructions   Important Information About Sugar         Signed, Angelena Form, PA-C  12/22/2021 3:39 PM    [facility]

## 2021-12-22 ENCOUNTER — Ambulatory Visit: Payer: PPO | Attending: Physician Assistant | Admitting: Physician Assistant

## 2021-12-22 VITALS — BP 126/76 | HR 69 | Ht 64.0 in | Wt 155.8 lb

## 2021-12-22 DIAGNOSIS — Z952 Presence of prosthetic heart valve: Secondary | ICD-10-CM | POA: Diagnosis not present

## 2021-12-22 DIAGNOSIS — I4811 Longstanding persistent atrial fibrillation: Secondary | ICD-10-CM

## 2021-12-22 DIAGNOSIS — K449 Diaphragmatic hernia without obstruction or gangrene: Secondary | ICD-10-CM | POA: Diagnosis not present

## 2021-12-22 DIAGNOSIS — I10 Essential (primary) hypertension: Secondary | ICD-10-CM

## 2021-12-22 MED ORDER — AZITHROMYCIN 500 MG PO TABS: ORAL_TABLET | ORAL | 6 refills | Status: DC

## 2021-12-22 NOTE — Patient Instructions (Addendum)
Medication Instructions:  Start Azithromycin 500 mg, take 1 tablet by mouth 1 hour before dental procedures and cleanings    *If you need a refill on your cardiac medications before your next appointment, please call your pharmacy*   Lab Work: None ordered   If you have labs (blood work) drawn today and your tests are completely normal, you will receive your results only by: [facility] (if you have MyChart) OR A paper copy in the mail If you have any lab test that is abnormal or we need to change your treatment, we will call you to review the results.   Testing/Procedures: None ordered today    Follow-Up: Follow up as scheduled   Other Instructions   Important Information About Sugar

## 2021-12-24 DIAGNOSIS — D3131 Benign neoplasm of right choroid: Secondary | ICD-10-CM | POA: Diagnosis not present

## 2021-12-24 DIAGNOSIS — H43813 Vitreous degeneration, bilateral: Secondary | ICD-10-CM | POA: Diagnosis not present

## 2021-12-24 DIAGNOSIS — H524 Presbyopia: Secondary | ICD-10-CM | POA: Diagnosis not present

## 2021-12-24 DIAGNOSIS — H5213 Myopia, bilateral: Secondary | ICD-10-CM | POA: Diagnosis not present

## 2021-12-24 DIAGNOSIS — H04123 Dry eye syndrome of bilateral lacrimal glands: Secondary | ICD-10-CM | POA: Diagnosis not present

## 2022-01-07 ENCOUNTER — Encounter (HOSPITAL_COMMUNITY)
Admission: RE | Admit: 2022-01-07 | Discharge: 2022-01-07 | Disposition: A | Discharge status: Home / Self Care | Payer: PPO | Source: Ambulatory Visit | Attending: Thoracic Surgery (Cardiothoracic Vascular Surgery) | Admitting: Thoracic Surgery (Cardiothoracic Vascular Surgery)

## 2022-01-07 ENCOUNTER — Encounter (HOSPITAL_COMMUNITY): Payer: Self-pay

## 2022-01-07 VITALS — BP 130/60 | HR 77 | Ht 64.0 in | Wt 153.9 lb

## 2022-01-07 DIAGNOSIS — Z952 Presence of prosthetic heart valve: Secondary | ICD-10-CM | POA: Insufficient documentation

## 2022-01-07 NOTE — Progress Notes (Signed)
Cardiac Individual Treatment Plan  Patient Details  Name: Autumn Moyer MRN: [id number] Date of Birth: 1940/06/08 Referring Provider:   Flowsheet Row CARDIAC REHAB PHASE II ORIENTATION from 01/07/2022 in [place]  Referring Provider Dr. Ali Lowe       Initial Encounter Date:  Flowsheet Row CARDIAC REHAB PHASE II ORIENTATION from 01/07/2022 in [place]  Date 01/07/22       Visit Diagnosis: S/P TAVR (transcatheter aortic valve replacement)  Patient's Home Medications on Admission:  Current Outpatient Medications:    acetaminophen (TYLENOL) 500 MG tablet, Take 1,000 mg by mouth in the morning., Disp: , Rfl:    apixaban (ELIQUIS) 5 MG TABS tablet, Take 1 tablet (5 mg total) by mouth 2 (two) times daily., Disp: 180 tablet, Rfl: 1   benazepril-hydrochlorthiazide (LOTENSIN HCT) 20-25 MG tablet, Take 1 tablet by mouth daily., Disp: 90 tablet, Rfl: 1   carboxymethylcellulose (REFRESH TEARS) 0.5 % SOLN, Place 2 drops into both eyes See admin instructions. Instill 2 drops into both eyes scheduled every morning & as needed for dry/irritated eyes., Disp: , Rfl:    digoxin (LANOXIN) 0.125 MG tablet, Take 1 tablet (0.125 mg total) by mouth daily., Disp: 90 tablet, Rfl: 3   furosemide (LASIX) 20 MG tablet, Take 20 mg by mouth daily as needed for edema or fluid., Disp: , Rfl:    levocetirizine (XYZAL) 5 MG tablet, Take 1 tablet (5 mg total) by mouth every evening., Disp: 90 tablet, Rfl: 1   levothyroxine (SYNTHROID) 88 MCG tablet, Take 1 tablet (88 mcg total) by mouth daily. (Patient taking differently: Take 88 mcg by mouth daily before breakfast.), Disp: 90 tablet, Rfl: 3   metoprolol tartrate (LOPRESSOR) 25 MG tablet, Take 1.5 tablets (37.5 mg total) by mouth 2 (two) times daily., Disp: 270 tablet, Rfl: 3   mometasone (ELOCON) 0.1 % cream, Apply 1 application topically daily. (Patient taking differently: Apply 1 application  topically daily as needed  (ear canal).), Disp: 45 g, Rfl: 2   Multiple Vitamin (MULTIVITAMIN WITH MINERALS) TABS tablet, Take 1 tablet by mouth every evening., Disp: , Rfl:    pantoprazole (PROTONIX) 40 MG tablet, Take 1 tablet (40 mg total) by mouth daily., Disp: 90 tablet, Rfl: 1   tacrolimus (PROTOPIC) 0.1 % ointment, Apply topically in the morning and at bedtime. (Patient taking differently: Apply 1 Application topically in the morning. 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 TAli Lowe& Dr. WLavonna Monarch  Severe aortic stenosis    Squamous cell carcinoma of skin 01/16/2018   in situ-right knee post (txpbx)   Thyroid disease     Tobacco Use: Social History   Tobacco Use  Smoking Status Never   Passive exposure: Past  Smokeless Tobacco Never    Labs: Review Flowsheet       Latest Ref Rng & Units 06/13/2016 02/07/2018 09/22/2021 12/14/2021  Labs for ITP Cardiac and Pulmonary Rehab  Hemoglobin A1c <7.0 % 6.0  6.0  - -  PH, Arterial 7.35 - 7.45 - - 7.350  7.290   PCO2 arterial 32 - 48 mmHg - - 48.5  60.6   Bicarbonate 20.0 - 28.0 mmol/L - - 27.5  28.8  26.8  29.1   TCO2 22 - 32 mmol/L - - '29  30  28  27  26  30  31   '$ O2 Saturation % - - 71  71  83  96     Capillary Blood Glucose: No results found for: "GLUCAP"   Exercise Target Goals: Exercise Program Goal: Individual exercise prescription set using results from initial 6 min walk test and THRR while considering  patient's activity barriers and safety.   Exercise Prescription Goal: Starting with  aerobic activity 30 plus minutes a day, 3 days per week for initial exercise prescription. Provide home exercise prescription and guidelines that participant acknowledges understanding prior to discharge.  Activity Barriers & Risk Stratification:  Activity Barriers & Cardiac Risk Stratification - 01/07/22 1248       Activity Barriers & Cardiac Risk Stratification   Activity Barriers Back Problems;Shortness of Breath;Balance Concerns    Cardiac Risk Stratification High             6 Minute Walk:  6 Minute Walk     Row Name 01/07/22 1352         6 Minute Walk   Phase Initial     Distance 1350 feet     Walk Time 6 minutes     # of Rest Breaks 0     MPH 2.56     METS 3.08     RPE 12     VO2 Peak 10.79     Symptoms No     Resting HR 77 bpm     Resting BP 130/60     Resting Oxygen Saturation  94 %     Exercise Oxygen Saturation  during 6 min walk 95 %     Max Ex. HR 128 bpm     Max Ex. BP 164/74     2 Minute Post BP 120/72              Oxygen Initial Assessment:   Oxygen Re-Evaluation:   Oxygen Discharge (Final Oxygen Re-Evaluation):   Initial Exercise Prescription:  Initial Exercise Prescription - 01/07/22 1300       Date of Initial Exercise RX and Referring Provider   Date 01/07/22    Referring Provider Dr. Ali Lowe    Expected Discharge Date 04/01/22      Treadmill   MPH 2    Grade 0    Minutes 17      NuStep   Level 1    SPM 80    Minutes 22      Prescription Details   Frequency (times per week) 3    Duration Progress to 30 minutes of continuous aerobic without signs/symptoms of physical distress      Intensity   THRR 40-80% of Max Heartrate 56-113    Ratings of Perceived Exertion 11-13    Perceived Dyspnea 0-4      Resistance Training   Training Prescription Yes    Weight 3    Reps 10-15             Perform Capillary Blood Glucose checks as needed.  Exercise Prescription Changes:   Exercise Comments:   Exercise  Goals and Review:   Exercise Goals     Row Name 01/07/22 1354  Exercise Goals   Increase Physical Activity Yes       Intervention Provide advice, education, support and counseling about physical activity/exercise needs.;Develop an individualized exercise prescription for aerobic and resistive training based on initial evaluation findings, risk stratification, comorbidities and participant's personal goals.       Expected Outcomes Short Term: Attend rehab on a regular basis to increase amount of physical activity.;Long Term: Add in home exercise to make exercise part of routine and to increase amount of physical activity.;Long Term: Exercising regularly at least 3-5 days a week.       Increase Strength and Stamina Yes       Intervention Provide advice, education, support and counseling about physical activity/exercise needs.;Develop an individualized exercise prescription for aerobic and resistive training based on initial evaluation findings, risk stratification, comorbidities and participant's personal goals.       Expected Outcomes Short Term: Increase workloads from initial exercise prescription for resistance, speed, and METs.;Short Term: Perform resistance training exercises routinely during rehab and add in resistance training at home;Long Term: Improve cardiorespiratory fitness, muscular endurance and strength as measured by increased METs and functional capacity (6MWT)       Able to understand and use rate of perceived exertion (RPE) scale Yes       Intervention Provide education and explanation on how to use RPE scale       Expected Outcomes Short Term: Able to use RPE daily in rehab to express subjective intensity level;Long Term:  Able to use RPE to guide intensity level when exercising independently       Knowledge and understanding of Target Heart Rate Range (THRR) Yes       Intervention Provide education and explanation of THRR including how the numbers were predicted  and where they are located for reference       Expected Outcomes Short Term: Able to state/look up THRR;Short Term: Able to use daily as guideline for intensity in rehab;Long Term: Able to use THRR to govern intensity when exercising independently       Able to check pulse independently Yes       Intervention Provide education and demonstration on how to check pulse in carotid and radial arteries.;Review the importance of being able to check your own pulse for safety during independent exercise       Expected Outcomes Short Term: Able to explain why pulse checking is important during independent exercise;Long Term: Able to check pulse independently and accurately       Understanding of Exercise Prescription Yes       Intervention Provide education, explanation, and written materials on patient's individual exercise prescription       Expected Outcomes Long Term: Able to explain home exercise prescription to exercise independently;Short Term: Able to explain program exercise prescription                Exercise Goals Re-Evaluation :    Discharge Exercise Prescription (Final Exercise Prescription Changes):   Nutrition:  Target Goals: Understanding of nutrition guidelines, daily intake of sodium '1500mg'$ , cholesterol '200mg'$ , calories 30% from fat and 7% or less from saturated fats, daily to have 5 or more servings of fruits and vegetables.  Biometrics:  Pre Biometrics - 01/07/22 1355       Pre Biometrics   Height '5\' 4"'$  (1.626 m)    Weight 69.8 kg    Waist Circumference 36.5 inches    Hip Circumference 39 inches    Waist to Hip Ratio 0.94 %  BMI (Calculated) 26.4    Triceps Skinfold 26 mm    % Body Fat 39.3 %    Grip Strength 22.1 kg    Flexibility 7 in    Single Leg Stand 5.14 seconds              Nutrition Therapy Plan and Nutrition Goals:  Nutrition Therapy & Goals - 01/07/22 1323       Personal Nutrition Goals   Comments Patient scored 22 on her diet assessment.  Handout explained and provided regarding heathier choices. We offer 2 educational sessions on heart healthy nutrition with handouts and assistance with RD referral if patient is interested.      Intervention Plan   Intervention Nutrition handout(s) given to patient.    Expected Outcomes Short Term Goal: Understand basic principles of dietary content, such as calories, fat, sodium, cholesterol and nutrients.             Nutrition Assessments:  Nutrition Assessments - 01/07/22 1323       MEDFICTS Scores   Pre Score 22            MEDIFICTS Score Key: ?70 Need to make dietary changes  40-70 Heart Healthy Diet ? 40 Therapeutic Level Cholesterol Diet   Picture Your Plate Scores: <86 Unhealthy dietary pattern with much room for improvement. 41-50 Dietary pattern unlikely to meet recommendations for good health and room for improvement. 51-60 More healthful dietary pattern, with some room for improvement.  >60 Healthy dietary pattern, although there may be some specific behaviors that could be improved.    Nutrition Goals Re-Evaluation:   Nutrition Goals Discharge (Final Nutrition Goals Re-Evaluation):   Psychosocial: Target Goals: Acknowledge presence or absence of significant depression and/or stress, maximize coping skills, provide positive support system. Participant is able to verbalize types and ability to use techniques and skills needed for reducing stress and depression.  Initial Review & Psychosocial Screening:  Initial Psych Review & Screening - 01/07/22 1335       Initial Review   Current issues with None Identified      Family Dynamics   Good Support System? Yes      Barriers   Psychosocial barriers to participate in program There are no identifiable barriers or psychosocial needs.      Screening Interventions   Interventions Encouraged to exercise;Provide feedback about the scores to participant    Expected Outcomes Short Term goal: Utilizing  psychosocial counselor, staff and physician to assist with identification of specific Stressors or current issues interfering with healing process. Setting desired goal for each stressor or current issue identified.             Quality of Life Scores:  Quality of Life - 01/07/22 1352       Quality of Life   Select Quality of Life      Quality of Life Scores   Health/Function Pre 22.71 %    Socioeconomic Pre 30 %    Psych/Spiritual Pre 26.57 %    Family Pre 26.3 %    GLOBAL Pre 25.48 %            Scores of 19 and below usually indicate a poorer quality of life in these areas.  A difference of  2-3 points is a clinically meaningful difference.  A difference of 2-3 points in the total score of the Quality of Life Index has been associated with significant improvement in overall quality of life, self-image, physical symptoms, and general health in  studies assessing change in quality of life.  PHQ-9: Review Flowsheet  More data exists      01/07/2022 12/17/2021 06/10/2021 05/27/2021 02/03/2021  Depression screen PHQ 2/9  Decreased Interest 0 0 0 0 0  Down, Depressed, Hopeless 0 0 0 0 0  PHQ - 2 Score 0 0 0 0 0  Altered sleeping 1 - - - -  Tired, decreased energy 1 - - - -  Change in appetite 0 - - - -  Feeling bad or failure about yourself  0 - - - -  Trouble concentrating 0 - - - -  Moving slowly or fidgety/restless 0 - - - -  Suicidal thoughts 0 - - - -  PHQ-9 Score 2 - - - -  Difficult doing work/chores Somewhat difficult - - - -   Interpretation of Total Score  Total Score Depression Severity:  1-4 = Minimal depression, 5-9 = Mild depression, 10-14 = Moderate depression, 15-19 = Moderately severe depression, 20-27 = Severe depression   Psychosocial Evaluation and Intervention:  Psychosocial Evaluation - 01/07/22 1335       Psychosocial Evaluation & Interventions   Interventions Stress management education;Relaxation education;Encouraged to exercise with the program  and follow exercise prescription    Comments Patient has no psychosocial barriers or issues identified at her orientation visit. Her PHQ-9 score was 2 due to lack of energy and trouble falling asleep at timess. She is a very pleasant 81 year old female who lives with her husband of many years. They have 3 children, 5 grandchildren and 1 great grandchild that all live near. She says her children and grandchildren are very supportative of she and her husband and she feels very blessed to have them. Her husband is currently doing our PR program. She is active around the house and is currently walking 3 days/week but wants to do more exercising. She is ready to start the program hoping to gain strength and stamina and have less SOB.    Expected Outcomes Patient will continue to have no psychosocial barriers or issues identified.    Continue Psychosocial Services  No Follow up required             Psychosocial Re-Evaluation:   Psychosocial Discharge (Final Psychosocial Re-Evaluation):   Vocational Rehabilitation: Provide vocational rehab assistance to qualifying candidates.   Vocational Rehab Evaluation & Intervention:  Vocational Rehab - 01/07/22 1325       Vocational Rehab Re-Evaulation   Comments Patient is retired and does not need vocational rehab.             Education: Education Goals: Education classes will be provided on a weekly basis, covering required topics. Participant will state understanding/return demonstration of topics presented.  Learning Barriers/Preferences:  Learning Barriers/Preferences - 01/07/22 1325       Learning Barriers/Preferences   Learning Barriers None    Learning Preferences Written Material;Skilled Demonstration;Pictoral             Education Topics: Hypertension, Hypertension Reduction -Define heart disease and high blood pressure. Discus how high blood pressure affects the body and ways to reduce high blood pressure.   Exercise  and Your Heart -Discuss why it is important to exercise, the FITT principles of exercise, normal and abnormal responses to exercise, and how to exercise safely.   Angina -Discuss definition of angina, causes of angina, treatment of angina, and how to decrease risk of having angina.   Cardiac Medications -Review what the following cardiac medications are used  for, how they affect the body, and side effects that may occur when taking the medications.  Medications include Aspirin, Beta blockers, calcium channel blockers, ACE Inhibitors, angiotensin receptor blockers, diuretics, digoxin, and antihyperlipidemics.   Congestive Heart Failure -Discuss the definition of CHF, how to live with CHF, the signs and symptoms of CHF, and how keep track of weight and sodium intake.   Heart Disease and Intimacy -Discus the effect sexual activity has on the heart, how changes occur during intimacy as we age, and safety during sexual activity.   Smoking Cessation / COPD -Discuss different methods to quit smoking, the health benefits of quitting smoking, and the definition of COPD.   Nutrition I: Fats -Discuss the types of cholesterol, what cholesterol does to the heart, and how cholesterol levels can be controlled.   Nutrition II: Labels -Discuss the different components of food labels and how to read food label   Heart Parts/Heart Disease and PAD -Discuss the anatomy of the heart, the pathway of blood circulation through the heart, and these are affected by heart disease.   Stress I: Signs and Symptoms -Discuss the causes of stress, how stress may lead to anxiety and depression, and ways to limit stress.   Stress II: Relaxation -Discuss different types of relaxation techniques to limit stress.   Warning Signs of Stroke / TIA -Discuss definition of a stroke, what the signs and symptoms are of a stroke, and how to identify when someone is having stroke.   Knowledge Questionnaire Score:   Knowledge Questionnaire Score - 01/07/22 1323       Knowledge Questionnaire Score   Pre Score 22/24             Core Components/Risk Factors/Patient Goals at Admission:  Personal Goals and Risk Factors at Admission - 01/07/22 1325       Core Components/Risk Factors/Patient Goals on Admission    Weight Management Weight Maintenance    Improve shortness of breath with ADL's Yes    Intervention Provide education, individualized exercise plan and daily activity instruction to help decrease symptoms of SOB with activities of daily living.    Expected Outcomes Short Term: Improve cardiorespiratory fitness to achieve a reduction of symptoms when performing ADLs;Long Term: Be able to perform more ADLs without symptoms or delay the onset of symptoms    Hypertension Yes    Intervention Provide education on lifestyle modifcations including regular physical activity/exercise, weight management, moderate sodium restriction and increased consumption of fresh fruit, vegetables, and low fat dairy, alcohol moderation, and smoking cessation.;Monitor prescription use compliance.    Expected Outcomes Short Term: Continued assessment and intervention until BP is < 140/20m HG in hypertensive participants. < 130/86mHG in hypertensive participants with diabetes, heart failure or chronic kidney disease.;Long Term: Maintenance of blood pressure at goal levels.    Personal Goal Other Yes    Personal Goal Patient wants to improve her strenght and stamina and have less SOB.    Intervention Patient will attend [facility] 3 days/week with exercise and education.    Expected Outcomes Patient will complete the program meeting both personal and program goals.             Core Components/Risk Factors/Patient Goals Review:    Core Components/Risk Factors/Patient Goals at Discharge (Final Review):    ITP Comments:   Comments: Patient arrived for 1st visit/orientation/education at 1230. Patient was referred to [facility]  by Dr. ArLenna Sciaraue to S/P TAVR. During orientation advised patient on arrival and appointment  times what to wear, what to do before, during and after exercise. Reviewed attendance and class policy.  Pt is scheduled to return Cardiac Rehab on 01/12/22 at 1100. Pt was advised to come to class 15 minutes before class starts.  Discussed RPE/Dpysnea scales. Patient participated in warm up stretches. Patient was able to complete 6 minute walk test.  Telemetry:AF with frequent PVC with [place] during walk test. Patient was measured for the equipment. Discussed equipment safety with patient. Took patient pre-anthropometric measurements. Patient finished visit at 1400.

## 2022-01-10 ENCOUNTER — Telehealth: Payer: Self-pay

## 2022-01-10 NOTE — Telephone Encounter (Signed)
I spoke with the pt and she has left a message on the nurse line at Dr Mellissa Kohut office and is awaiting a return call.  The pt denies any one sided weakness, slurred speech or facial droop.  The pt is aware of stroke symptoms and will monitor for these and call 911 if she develops these symptoms.

## 2022-01-10 NOTE — Telephone Encounter (Signed)
Late Entry:  The pt called the office at 10:06 AM to discuss visual changes that she has experienced in the past week. This past Thursday and Saturday the patient noted that her eyes seemed to not want to focus and each episode lasted about one minute.  Sunday evening the pt noted double vision and this lasted for 5 minutes.  The pt underwent TAVR 12/14/2021 and denies any other visual disturbances prior to this past Thursday.  The pt does follow with Dr Satira Sark at [facility] and at the beginning of the month had a routine eye exam performed.  I advised the patient to contact Dr Mellissa Kohut office and discuss symptoms and arrange evaluation.  Pt agreed with plan.  I will forward this message to the TAVR team to review and make further recommendations if needed.

## 2022-01-11 ENCOUNTER — Telehealth: Payer: Self-pay | Admitting: Cardiology

## 2022-01-11 DIAGNOSIS — H532 Diplopia: Secondary | ICD-10-CM | POA: Diagnosis not present

## 2022-01-11 NOTE — Telephone Encounter (Signed)
Patient called to inform our team that she was seen by her ophthalmologist earlier today with no acute findings. She continues to have visual disturbances however no new or changing symptoms. I have encouraged her to keep follow up with our team for 10/30. May need further testing and referral to neurology.   Kathyrn Drown NP-C Structural Heart Team  Pager: [phone] Phone: [phone]

## 2022-01-12 ENCOUNTER — Encounter (HOSPITAL_COMMUNITY)
Admission: RE | Admit: 2022-01-12 | Discharge: 2022-01-12 | Disposition: A | Discharge status: Home / Self Care | Payer: PPO | Source: Ambulatory Visit | Attending: Internal Medicine | Admitting: Internal Medicine

## 2022-01-12 DIAGNOSIS — Z952 Presence of prosthetic heart valve: Secondary | ICD-10-CM

## 2022-01-12 NOTE — Progress Notes (Signed)
Daily Session Note  Patient Details  Name: Autumn Moyer MRN: [id number] Date of Birth: 1940/03/25 Referring Provider:   Flowsheet Row CARDIAC REHAB PHASE II ORIENTATION from 01/07/2022 in [place]  Referring Provider Dr. Ali Lowe       Encounter Date: 01/12/2022  Check In:  Session Check In - 01/12/22 1100       Check-In   Supervising physician immediately available to respond to emergencies [facility] MD immediately available    Physician(s) Dr. Marlou Porch    Location [facility]    Staff Present Redge Gainer, BS, Exercise Physiologist;Daphyne Hassell Done, RN, BSN;Dalton Sherrie George, MS, ACSM-CEP    Virtual Visit No    Medication changes reported     No    Fall or balance concerns reported    Yes    Comments Patient reports frequently losing her balance.    Tobacco Cessation No Change    Warm-up and Cool-down Performed as group-led instruction    Resistance Training Performed Yes    VAD Patient? No    PAD/SET Patient? No      Pain Assessment   Currently in Pain? No/denies    Pain Score 0-No pain    Multiple Pain Sites No             Capillary Blood Glucose: No results found for this or any previous visit (from the past 24 hour(s)).    Social History   Tobacco Use  Smoking Status Never   Passive exposure: Past  Smokeless Tobacco Never    Goals Met:  Independence with exercise equipment Exercise tolerated well No report of concerns or symptoms today Strength training completed today  Goals Unmet:  Not Applicable  Comments: check out 1200   Dr. Carlyle Dolly is Medical Director for [facility]

## 2022-01-14 ENCOUNTER — Encounter (HOSPITAL_COMMUNITY)
Admission: RE | Admit: 2022-01-14 | Discharge: 2022-01-14 | Disposition: A | Discharge status: Home / Self Care | Payer: PPO | Source: Ambulatory Visit | Attending: Internal Medicine | Admitting: Internal Medicine

## 2022-01-14 DIAGNOSIS — Z952 Presence of prosthetic heart valve: Secondary | ICD-10-CM

## 2022-01-14 NOTE — Progress Notes (Signed)
[facility]                                     Cardiology Office Note:    Date:  01/21/2022   ID:  Autumn Moyer, DOB Jul 26, 1940, MRN [id number]  PCP:  Autumn Moyer, [facility] Cardiologist:  Autumn Breeding, MD  / Dr. Ali Lowe, MD & Dr. Lavonna Monarch, MD (TAVR) [place] Electrophysiologist:  None   Referring MD: Autumn Balloon, FNP   Chief Complaint  Patient presents with   Follow-up    One month s/p TAVR   History of Present Illness:    Autumn Moyer is a 81 y.o. female with a hx of  hypertension, paroxysmal atrial fibrillation on anticoagulation, hypothyroidism, and severe aortic stenosis s/p TAVR (12/14/21) who presents to clinic for one month follow up.    Autumn Moyer has been followed with periodic echocardiograms by Dr. Percival Spanish since 2016. In 03/2020 echocardiogram showed moderate aortic valve stenosis with an AVA by VTI measuring 1.24 cm and mean gradient 26.8 mmHg. She was then diagnosed with atrial fibrillation and was treated with anticoagulation and rate control  medications. On follow up, she was having SOB and was set up for a [place] which was negative for ischemia or infarct. Her symptoms continued therefore repeat echo 05/2021 showed worsening AS with a mean gradient at 38 mmHg with an AVA at 0.5cm2. She was then referred to the Structural Heart team for further evaluation and possible TAVR. She underwent R/LHC which showed mild obstructive CAD with a proximal LAD lesion at 20% stenosis.    She was evaluated by the multidisciplinary valve team and underwent successful TAVR with a 23 mm Edwards Sapien 3 THV via the TF approach on 12/14/21. Post operative echo showed EF 60%, normally functioning TAVR with a mean gradient of 9 mmHg and no PVL as well as mild MR and moderate pulm HTN. She was discharged on home Eliquis.   In TOC follow up, she was doing well with no issues. She continues to do well with  no chest pain, SOB, LE edema, palpitations, orthopnea, or syncope. She does report several episodes of visual changes including seeing "double" on two occasions. She states that both times, her symptoms would last anywhere from 5-30 minutes then would resolve on their own. No other neuro changes such as confusion, facial or extremity asymmetry, or issues with speech.   Past Medical History:  Diagnosis Date   Atrial fibrillation, persistent (HCC)    Complication of anesthesia    Hiatal hernia    Hypertension    PONV (postoperative nausea and vomiting)    S/P TAVR (transcatheter aortic valve replacement) 12/14/2021   s/p TAVR with a 52m Edwards S3UR via the TF approach by Dr. TAli Moyer& Dr. WLavonna Moyer  Severe aortic stenosis    Squamous cell carcinoma of skin 01/16/2018   in situ-right knee post (txpbx)   Thyroid disease     Past Surgical History:  Procedure Laterality Date   ABDOMINAL AORTOGRAM N/A 09/22/2021   Procedure: ABDOMINAL AORTOGRAM;  Surgeon: TEarly Osmond MD;  [facility];  Service: Cardiovascular;  Laterality: N/A;   CARDIAC CATHETERIZATION     EYE SURGERY     INTRAOPERATIVE TRANSTHORACIC ECHOCARDIOGRAM N/A 12/14/2021   Procedure: INTRAOPERATIVE TRANSTHORACIC ECHOCARDIOGRAM;  Surgeon: TEarly Osmond MD;  Location: [facility];  Service: Open Heart Surgery;  Laterality: N/A;   MULTIPLE EXTRACTIONS WITH ALVEOLOPLASTY N/A 12/02/2021   Procedure: MULTIPLE EXTRACTION WITH ALVEOLOPLASTY;  Surgeon: Charlaine Dalton, DMD;  Location: [facility];  Service: Dentistry;  Laterality: N/A;   RIGHT HEART CATH AND CORONARY/GRAFT ANGIOGRAPHY N/A 09/22/2021   Procedure: RIGHT HEART CATH AND CORONARY/GRAFT ANGIOGRAPHY;  Surgeon: Early Osmond, MD;  Location: [facility];  Service: Cardiovascular;  Laterality: N/A;   TOE SURGERY     TRANSCATHETER AORTIC VALVE REPLACEMENT, TRANSFEMORAL N/A 12/14/2021   Procedure: Transcatheter Aortic Valve Replacement, Transfemoral using  Edwards 23 MM SAPIEN 3 Ultra;  Surgeon: Early Osmond, MD;  Location: [facility];  Service: Open Heart Surgery;  Laterality: N/A;  Transfemoral approach   TUBAL LIGATION      Current Medications: Current Meds  Medication Sig   acetaminophen (TYLENOL) 500 MG tablet Take 1,000 mg by mouth in the morning.   apixaban (ELIQUIS) 5 MG TABS tablet Take 1 tablet (5 mg total) by mouth 2 (two) times daily.   azithromycin (ZITHROMAX) 500 MG tablet Take 1 tablet by mouth 1 hour before dental cleanings and procedures   benazepril-hydrochlorthiazide (LOTENSIN HCT) 20-25 MG tablet Take 1 tablet by mouth daily.   carboxymethylcellulose (REFRESH TEARS) 0.5 % SOLN Place 2 drops into both eyes See admin instructions. Instill 2 drops into both eyes scheduled every morning & as needed for dry/irritated eyes.   digoxin (LANOXIN) 0.125 MG tablet Take 1 tablet (0.125 mg total) by mouth daily.   furosemide (LASIX) 20 MG tablet Take 20 mg by mouth daily as needed for edema or fluid.   levocetirizine (XYZAL) 5 MG tablet Take 1 tablet (5 mg total) by mouth every evening.   levothyroxine (SYNTHROID) 88 MCG tablet Take 1 tablet (88 mcg total) by mouth daily.   metoprolol tartrate (LOPRESSOR) 50 MG tablet Take 1 tablet (50 mg total) by mouth 2 (two) times daily.   mometasone (ELOCON) 0.1 % cream Apply 1 application topically daily.   Multiple Vitamin (MULTIVITAMIN WITH MINERALS) TABS tablet Take 1 tablet by mouth every evening.   pantoprazole (PROTONIX) 40 MG tablet Take 1 tablet (40 mg total) by mouth daily.   tacrolimus (PROTOPIC) 0.1 % ointment Apply topically in the morning and at bedtime.   [DISCONTINUED] metoprolol tartrate (LOPRESSOR) 25 MG tablet Take 1.5 tablets (37.5 mg total) by mouth 2 (two) times daily.     Allergies:   Noroxin [norfloxacin] and Augmentin [amoxicillin-pot clavulanate]   Social History   Socioeconomic History   Marital status: Married    Spouse name: Danny   Number of children: 2   Years  of education: Not on file   Highest education level: Not on file  Occupational History   Not on file  Tobacco Use   Smoking status: Never    Passive exposure: Past   Smokeless tobacco: Never  Vaping Use   Vaping Use: Never used  Substance and Sexual Activity   Alcohol use: No    Alcohol/week: 0.0 standard drinks of alcohol   Drug use: No   Sexual activity: Not Currently    Partners: Male  Other Topics Concern   Not on file  Social History Narrative   Lives with husband.  Married x 56 years in 2022.   2 daughters   5 grand children   1 great grandchild.   Social Determinants of Health   Financial Resource Strain: Low Risk  (02/03/2021)   Overall Financial Resource  Strain (CARDIA)    Difficulty of Paying Living Expenses: Not hard at all  Food Insecurity: No Food Insecurity (12/14/2021)   Hunger Vital Sign    Worried About Running Out of Food in the Last Year: Never true    Ran Out of Food in the Last Year: Never true  Transportation Needs: No Transportation Needs (12/14/2021)   PRAPARE - Hydrologist (Medical): No    Lack of Transportation (Non-Medical): No  Physical Activity: Insufficiently Active (02/03/2021)   Exercise Vital Sign    Days of Exercise per Week: 3 days    Minutes of Exercise per Session: 20 min  Stress: No Stress Concern Present (02/03/2021)   [place]    Feeling of Stress : Not at all  Social Connections: [place] (02/03/2021)   Social Connection and Isolation Panel [NHANES]    Frequency of Communication with Friends and Family: More than three times a week    Frequency of Social Gatherings with Friends and Family: More than three times a week    Attends Religious Services: More than 4 times per year    Active Member of [employer] or Organizations: Yes    Attends Music therapist: More than 4 times per year    Marital Status: Married      Family History: The patient's family history includes Breast cancer in her daughter and paternal aunt; Heart disease in her father; Heart failure in her mother; Hyperlipidemia in her father and mother; Hypertension in her father, mother, and sister; Lung cancer in her father.  ROS:   Please see the history of present illness.    All other systems reviewed and are negative.  EKGs/Labs/Other Studies Reviewed:    The following studies were reviewed today:  Echocardiogram 01/17/22:   1. Left ventricular ejection fraction, by estimation, is >75%. The left  ventricle has hyperdynamic function. The left ventricle has no regional  wall motion abnormalities. Left ventricular diastolic parameters are  consistent with Grade II diastolic  dysfunction (pseudonormalization).   2. Right ventricular systolic function is normal. The right ventricular  size is mildly enlarged. There is mildly elevated pulmonary artery  systolic pressure. The estimated right ventricular systolic pressure is  69.4 mmHg.   3. Left atrial size was severely dilated.   4. Right atrial size was moderately dilated.   5. The mitral valve is normal in structure. Mild mitral valve  regurgitation. No evidence of mitral stenosis.   6. Tricuspid valve regurgitation is moderate.   7. The aortic valve is normal in structure. Aortic valve regurgitation is  not visualized. No aortic stenosis is present. There is a 23 mm Sapien  prosthetic (TAVR) valve present in the aortic position. Procedure Date:  12-14-21. Echo findings are  consistent with normal structure and function of the aortic valve  prosthesis. Aortic valve area, by VTI measures 1.98 cm. Aortic valve mean  gradient measures 8.7 mmHg. Aortic valve Vmax measures 1.99 m/s.   8. The inferior vena cava is normal in size with greater than 50%  respiratory variability, suggesting right atrial pressure of 3 mmHg.      TAVR OPERATIVE NOTE     Date of Procedure:                 12/14/2021   Preoperative Diagnosis:      Severe Aortic Stenosis    Postoperative Diagnosis:    Same    Procedure:  Transcatheter Aortic Valve Replacement - Transfemoral Approach             Edwards Sapien 3 Resilia THV (size 23 mm, model # 9755RLS, serial # [id number])              Co-Surgeons:                         Gaye Pollack, MD and Lenna Sciara, MD Anesthesiologist:                  Renold Don, MD   Echocardiographer:              Rudean Haskell, MD   Pre-operative Echo Findings: Severe aortic stenosis Normal left ventricular systolic function   Post-operative Echo Findings: No paravalvular leak Normal left ventricular systolic function   Left Heart Catheterization Findings: Left ventricular end-diastolic pressure of 93GHWE _____________     Echo 12/15/21: IMPRESSIONS   1. The aortic valve has been replaced with a 23 mm Sapien Valve. Aortic  valve regurgitation is not visualized. Effective orifice area, by VTI  measures 2.44 cm. Aortic valve mean gradient measures 9.0 mmHg. Aortic  valve Vmax measures 2.06 m/s. DVI 0.64.   Peak gradient 14 mm Hg.   2. Left ventricular ejection fraction, by estimation, is 60 to 65%. The  left ventricle has normal function. The left ventricle has no regional  wall motion abnormalities. Left ventricular diastolic parameters are  indeterminate.   3. Right ventricular systolic function is normal. The right ventricular  size is normal. There is moderately elevated pulmonary artery systolic  pressure. The estimated right ventricular systolic pressure is 99.3 mmHg.   4. Left atrial size was severely dilated.   5. Right atrial size was severely dilated.   6. The mitral valve is degenerative. Mild mitral valve regurgitation. The  mean mitral valve gradient is 2.0 mmHg with average heart rate of 82 bpm.  Moderate mitral annular calcification.   7. Tricuspid valve regurgitation is moderate.   Comparison(s): Echo  findings are consistent with normal structure and  function of the aortic valve prosthesis.   EKG:  EKG is not ordered today.    Recent Labs: 05/27/2021: TSH 2.030 12/10/2021: ALT 17 12/15/2021: BUN 12; Creatinine, Ser 0.70; Hemoglobin 11.6; Magnesium 1.7; Platelets 215; Potassium 4.1; Sodium 140  Recent Lipid Panel No results found for: "CHOL", "TRIG", "HDL", "CHOLHDL", "VLDL", "LDLCALC", "LDLDIRECT"  Physical Exam:    VS:  BP (!) 140/70 (BP Location: Left Arm, Patient Position: Sitting, Cuff Size: Normal)   Pulse 80   Ht '5\' 4"'$  (1.626 m)   Wt 154 lb (69.9 kg)   SpO2 97%   BMI 26.43 kg/m     Wt Readings from Last 3 Encounters:  01/17/22 154 lb (69.9 kg)  01/07/22 153 lb 14.1 oz (69.8 kg)  12/22/21 155 lb 12.8 oz (70.7 kg)    General: Well developed, well nourished, NAD Neck: Negative for carotid bruits. No JVD Lungs:Clear to ausculation bilaterally. No wheezes, rales, or rhonchi. Breathing is unlabored. Cardiovascular: RRR with S1 S2. No murmurs Extremities: No edema.  Neuro: Alert and oriented. No focal deficits. No facial asymmetry. MAE spontaneously. Psych: Responds to questions appropriately with normal affect.    ASSESSMENT/PLAN:    Severe AS s/p TAVR: Patient with NYHA class I symptoms s/p TAVR. Continues to do well one month post TAVR. Echo today with LVEF at >75% with G2DD, 46m S3UR valve with AVA bt VTI  at 1.98cm2, mean gradient 8.10mHg, peak 15.947mg, and DI at 0.48. Continue Eliquis monotherapy. Lifelong dental SBE discussed. Plan one year follow up with echocardiogram.   Diplopia: Reports several episodes of diplopia occurring while sitting and watching television. Symptoms lasted anywhere from 5-30 minutes then resolved with no other neuro changes including facial asymmetry, speech or movement abnormalities. Initial plan to schedule head CT and refer to neurology. Patent wishes to see neurology first. Continue current regimen for now. ED precautions reviewed with the  patient.    HTN: BP well controlled. No changes made.    Persistent atrial fibrillation: Rates stable. Continue on Eliquis, digoxin and lopressor  Bigeminy: Noted during CRII. Strips sent and reviewed. Increase Metoprolol to '50mg'$  BID.    Hiatal hernia: pre TAVR CT showed a very large hiatal hernia with displacement of the thoracic aorta into the R chest. Tortuous course noted. Pt asked if this would need to be fixed.     Medication Adjustments/Labs and Tests Ordered: Current medicines are reviewed at length with the patient today.  Concerns regarding medicines are outlined above.  Orders Placed This Encounter  Procedures   Ambulatory referral to Neurology   Meds ordered this encounter  Medications   metoprolol tartrate (LOPRESSOR) 50 MG tablet    Sig: Take 1 tablet (50 mg total) by mouth 2 (two) times daily.    Dispense:  180 tablet    Refill:  3    Patient Instructions  Medication Instructions:  Please increase your Metoprolol Tartrate to 50 mg twice day. Continue all other medications as listed.  *If you need a refill on your cardiac medications before your next appointment, please call your pharmacy*  Testing/Procedures: Your physician has requested that you have an echocardiogram in 1 yr. Echocardiography is a painless test that uses sound waves to create images of your heart. It provides your doctor with information about the size and shape of your heart and how well your heart's chambers and valves are working. This procedure takes approximately one hour. There are no restrictions for this procedure. Please do NOT wear cologne, perfume, aftershave, or lotions (deodorant is allowed). Please arrive 15 minutes prior to your appointment time.  You have been referred to Neurology for evaluation of Diplopia  Follow-Up: [facility].  As part of our continuing mission to provide you with exceptional heart care, we have  created designated Provider Care Teams.  These Care Teams include your primary Cardiologist (physician) and Advanced Practice Providers (APPs -  Physician Assistants and Nurse Practitioners) who all work together to provide you with the care you need, when you need it.  We recommend signing up for the patient portal called "MyChart".  Sign up information is provided on this After Visit Summary.  MyChart is used to connect with patients for Virtual Visits (Telemedicine).  Patients are able to view lab/test results, encounter notes, upcoming appointments, etc.  Non-urgent messages can be sent to your provider as well.   To learn more about what you can do with MyChart, go to [email or web address]   Your next appointment:   Follow up with Structural Heart Team as scheduled.  6 month(s)  The format for your next appointment:   In Person  Provider:   JaMinus BreedingMD     Important Information About Sugar         Signed, JiKathyrn DrownNP  01/21/2022 2:21 PM    [facility]

## 2022-01-14 NOTE — Progress Notes (Signed)
Daily Session Note  Patient Details  Name: Autumn Moyer MRN: [id number] Date of Birth: Jul 30, 1940 Referring Provider:   Flowsheet Row CARDIAC REHAB PHASE II ORIENTATION from 01/07/2022 in [facility]  Referring Provider Dr. Ali Lowe       Encounter Date: 01/14/2022  Check In:  Session Check In - 01/14/22 1053       Check-In   Supervising physician immediately available to respond to emergencies [facility] MD immediately available    Physician(s) Dr. Harl Bowie    Location [facility]    Staff Present Aundra Dubin, RN, BSN;Mickeal Daws Sherrie George, MS, ACSM-CEP;Leana Roe, BS, Exercise Physiologist    Virtual Visit No    Medication changes reported     No    Fall or balance concerns reported    Yes    Comments Patient reports frequently losing her balance.    Tobacco Cessation No Change    Warm-up and Cool-down Performed as group-led instruction    Resistance Training Performed Yes    VAD Patient? No    PAD/SET Patient? No      Pain Assessment   Currently in Pain? No/denies    Pain Score 0-No pain    Multiple Pain Sites No             Capillary Blood Glucose: No results found for this or any previous visit (from the past 24 hour(s)).    Social History   Tobacco Use  Smoking Status Never   Passive exposure: Past  Smokeless Tobacco Never    Goals Met:  Independence with exercise equipment Exercise tolerated well No report of concerns or symptoms today Strength training completed today  Goals Unmet:  Not Applicable  Comments: checkout time is 1200   Dr. Carlyle Dolly is Medical Director for [facility]

## 2022-01-17 ENCOUNTER — Other Ambulatory Visit (HOSPITAL_COMMUNITY): Payer: Self-pay | Admitting: Physician Assistant

## 2022-01-17 ENCOUNTER — Other Ambulatory Visit: Payer: Self-pay | Admitting: Cardiology

## 2022-01-17 ENCOUNTER — Ambulatory Visit: Payer: PPO | Attending: Cardiology | Admitting: Cardiology

## 2022-01-17 ENCOUNTER — Ambulatory Visit (HOSPITAL_BASED_OUTPATIENT_CLINIC_OR_DEPARTMENT_OTHER): Payer: PPO

## 2022-01-17 ENCOUNTER — Encounter (HOSPITAL_COMMUNITY): Payer: PPO

## 2022-01-17 VITALS — BP 140/70 | HR 80 | Ht 64.0 in | Wt 154.0 lb

## 2022-01-17 DIAGNOSIS — H532 Diplopia: Secondary | ICD-10-CM | POA: Insufficient documentation

## 2022-01-17 DIAGNOSIS — Z952 Presence of prosthetic heart valve: Secondary | ICD-10-CM | POA: Diagnosis not present

## 2022-01-17 DIAGNOSIS — I10 Essential (primary) hypertension: Secondary | ICD-10-CM | POA: Diagnosis not present

## 2022-01-17 DIAGNOSIS — I4811 Longstanding persistent atrial fibrillation: Secondary | ICD-10-CM | POA: Diagnosis not present

## 2022-01-17 LAB — ECHOCARDIOGRAM COMPLETE
AR max vel: 2.01 cm2
AV Area VTI: 1.98 cm2
AV Area mean vel: 1.99 cm2
AV Mean grad: 8.7 mmHg
AV Peak grad: 15.9 mmHg
Ao pk vel: 1.99 m/s
Area-P 1/2: 4.23 cm2
S' Lateral: 2.2 cm

## 2022-01-17 MED ORDER — METOPROLOL TARTRATE 50 MG PO TABS: 50.0000 mg | ORAL_TABLET | Freq: Two times a day (BID) | ORAL | 3 refills | Status: DC

## 2022-01-17 NOTE — Patient Instructions (Signed)
Medication Instructions:  Please increase your Metoprolol Tartrate to 50 mg twice day. Continue all other medications as listed.  *If you need a refill on your cardiac medications before your next appointment, please call your pharmacy*  Testing/Procedures: Your physician has requested that you have an echocardiogram in 1 yr. Echocardiography is a painless test that uses sound waves to create images of your heart. It provides your doctor with information about the size and shape of your heart and how well your heart's chambers and valves are working. This procedure takes approximately one hour. There are no restrictions for this procedure. Please do NOT wear cologne, perfume, aftershave, or lotions (deodorant is allowed). Please arrive 15 minutes prior to your appointment time.  You have been referred to Neurology for evaluation of Diplopia  Follow-Up: At [facility], you and your health needs are our priority.  As part of our continuing mission to provide you with exceptional heart care, we have created designated Provider Care Teams.  These Care Teams include your primary Cardiologist (physician) and Advanced Practice Providers (APPs -  Physician Assistants and Nurse Practitioners) who all work together to provide you with the care you need, when you need it.  We recommend signing up for the patient portal called "MyChart".  Sign up information is provided on this After Visit Summary.  MyChart is used to connect with patients for Virtual Visits (Telemedicine).  Patients are able to view lab/test results, encounter notes, upcoming appointments, etc.  Non-urgent messages can be sent to your provider as well.   To learn more about what you can do with MyChart, go to [email or web address].    Your next appointment:   Follow up with Structural Heart Team as scheduled.  6 month(s)  The format for your next appointment:   In Person  Provider:   Minus Breeding, MD     Important  Information About Sugar

## 2022-01-19 ENCOUNTER — Encounter (HOSPITAL_COMMUNITY)
Admission: RE | Admit: 2022-01-19 | Discharge: 2022-01-19 | Disposition: A | Discharge status: Home / Self Care | Payer: PPO | Source: Ambulatory Visit | Attending: Internal Medicine | Admitting: Internal Medicine

## 2022-01-19 DIAGNOSIS — Z952 Presence of prosthetic heart valve: Secondary | ICD-10-CM | POA: Diagnosis not present

## 2022-01-19 NOTE — Progress Notes (Signed)
Daily Session Note  Patient Details  Name: Autumn Moyer MRN: [id number] Date of Birth: Dec 29, 1940 Referring Provider:   Flowsheet Row CARDIAC REHAB PHASE II ORIENTATION from 01/07/2022 in [facility]  Referring Provider Dr. Ali Lowe       Encounter Date: 01/19/2022  Check In:  Session Check In - 01/19/22 1100       Check-In   Supervising physician immediately available to respond to emergencies [facility] MD immediately available    Physician(s) Dr. Domenic Polite    Location [facility]    Staff Present Leana Roe, BS, Exercise Physiologist;[place] Sherrie George, MS, ACSM-CEP    Virtual Visit No    Medication changes reported     No    Fall or balance concerns reported    Yes    Comments Patient reports frequently losing her balance.    Tobacco Cessation No Change    Warm-up and Cool-down Performed as group-led instruction    Resistance Training Performed Yes    VAD Patient? No    PAD/SET Patient? No      Pain Assessment   Currently in Pain? No/denies    Pain Score 0-No pain    Multiple Pain Sites No             Capillary Blood Glucose: No results found for this or any previous visit (from the past 24 hour(s)).    Social History   Tobacco Use  Smoking Status Never   Passive exposure: Past  Smokeless Tobacco Never    Goals Met:  Independence with exercise equipment Exercise tolerated well No report of concerns or symptoms today Strength training completed today  Goals Unmet:  Not Applicable  Comments: check out 1200   Dr. Carlyle Dolly is Medical Director for [facility]

## 2022-01-21 ENCOUNTER — Encounter (HOSPITAL_COMMUNITY)
Admission: RE | Admit: 2022-01-21 | Discharge: 2022-01-21 | Disposition: A | Discharge status: Home / Self Care | Payer: PPO | Source: Ambulatory Visit | Attending: Internal Medicine | Admitting: Internal Medicine

## 2022-01-21 DIAGNOSIS — Z952 Presence of prosthetic heart valve: Secondary | ICD-10-CM | POA: Diagnosis not present

## 2022-01-21 NOTE — Progress Notes (Signed)
Daily Session Note  Patient Details  Name: Autumn Moyer MRN: [id number] Date of Birth: 1940-05-26 Referring Provider:   Flowsheet Row CARDIAC REHAB PHASE II ORIENTATION from 01/07/2022 in [facility]  Referring Provider Dr. Ali Lowe       Encounter Date: 01/21/2022  Check In:  Session Check In - 01/21/22 1100       Check-In   Supervising physician immediately available to respond to emergencies [facility] MD immediately available    Physician(s) Dr. Zandra Abts    Location [facility]    Staff Present Leana Roe, BS, Exercise Physiologist;Elain Wixon Wynetta Emery, RN, Joanette Gula, RN, BSN    Virtual Visit No    Medication changes reported     No    Fall or balance concerns reported    Yes    Comments Patient reports frequently losing her balance.    Tobacco Cessation No Change    Warm-up and Cool-down Performed as group-led instruction    Resistance Training Performed Yes    VAD Patient? No    PAD/SET Patient? No      Pain Assessment   Currently in Pain? No/denies    Pain Score 0-No pain    Multiple Pain Sites No             Capillary Blood Glucose: No results found for this or any previous visit (from the past 24 hour(s)).    Social History   Tobacco Use  Smoking Status Never   Passive exposure: Past  Smokeless Tobacco Never    Goals Met:  Independence with exercise equipment Exercise tolerated well No report of concerns or symptoms today Strength training completed today  Goals Unmet:  Not Applicable  Comments: Check out 1200.   Dr. Carlyle Dolly is Medical Director for [facility]

## 2022-01-24 ENCOUNTER — Encounter: Payer: Self-pay | Admitting: Neurology

## 2022-01-24 ENCOUNTER — Encounter (HOSPITAL_COMMUNITY): Payer: PPO

## 2022-01-26 ENCOUNTER — Encounter (HOSPITAL_COMMUNITY)
Admission: RE | Admit: 2022-01-26 | Discharge: 2022-01-26 | Disposition: A | Discharge status: Home / Self Care | Payer: PPO | Source: Ambulatory Visit | Attending: Internal Medicine | Admitting: Internal Medicine

## 2022-01-26 DIAGNOSIS — Z952 Presence of prosthetic heart valve: Secondary | ICD-10-CM

## 2022-01-26 NOTE — Progress Notes (Signed)
Cardiac Individual Treatment Plan  Patient Details  Name: SALIAH CRISP MRN: [id number] Date of Birth: 10-19-1940 Referring Provider:   Flowsheet Row CARDIAC REHAB PHASE II ORIENTATION from 01/07/2022 in [facility]  Referring Provider Dr. Ali Lowe       Initial Encounter Date:  Flowsheet Row CARDIAC REHAB PHASE II ORIENTATION from 01/07/2022 in [facility]  Date 01/07/22       Visit Diagnosis: S/P TAVR (transcatheter aortic valve replacement)  Patient's Home Medications on Admission:  Current Outpatient Medications:    acetaminophen (TYLENOL) 500 MG tablet, Take 1,000 mg by mouth in the morning., Disp: , Rfl:    apixaban (ELIQUIS) 5 MG TABS tablet, Take 1 tablet (5 mg total) by mouth 2 (two) times daily., Disp: 180 tablet, Rfl: 1   azithromycin (ZITHROMAX) 500 MG tablet, Take 1 tablet by mouth 1 hour before dental cleanings and procedures, Disp: 12 tablet, Rfl: 6   benazepril-hydrochlorthiazide (LOTENSIN HCT) 20-25 MG tablet, Take 1 tablet by mouth daily., Disp: 90 tablet, Rfl: 1   carboxymethylcellulose (REFRESH TEARS) 0.5 % SOLN, Place 2 drops into both eyes See admin instructions. Instill 2 drops into both eyes scheduled every morning & as needed for dry/irritated eyes., Disp: , Rfl:    digoxin (LANOXIN) 0.125 MG tablet, Take 1 tablet (0.125 mg total) by mouth daily., Disp: 90 tablet, Rfl: 3   furosemide (LASIX) 20 MG tablet, Take 20 mg by mouth daily as needed for edema or fluid., Disp: , Rfl:    levocetirizine (XYZAL) 5 MG tablet, Take 1 tablet (5 mg total) by mouth every evening., Disp: 90 tablet, Rfl: 1   levothyroxine (SYNTHROID) 88 MCG tablet, Take 1 tablet (88 mcg total) by mouth daily., Disp: 90 tablet, Rfl: 3   metoprolol tartrate (LOPRESSOR) 50 MG tablet, Take 1 tablet (50 mg total) by mouth 2 (two) times daily., Disp: 180 tablet, Rfl: 3   mometasone (ELOCON) 0.1 % cream, Apply 1 application topically daily., Disp: 45 g, Rfl:  2   Multiple Vitamin (MULTIVITAMIN WITH MINERALS) TABS tablet, Take 1 tablet by mouth every evening., Disp: , Rfl:    pantoprazole (PROTONIX) 40 MG tablet, Take 1 tablet (40 mg total) by mouth daily., Disp: 90 tablet, Rfl: 1   tacrolimus (PROTOPIC) 0.1 % ointment, Apply topically in the morning and at bedtime., Disp: 30 g, Rfl: 4  Past Medical History: Past Medical History:  Diagnosis Date   Atrial fibrillation, persistent (HCC)    Complication of anesthesia    Hiatal hernia    Hypertension    PONV (postoperative nausea and vomiting)    S/P TAVR (transcatheter aortic valve replacement) 12/14/2021   s/p TAVR with a 19m Edwards S3UR via the TF approach by Dr. TAli Lowe& Dr. WLavonna Monarch  Severe aortic stenosis    Squamous cell carcinoma of skin 01/16/2018   in situ-right knee post (txpbx)   Thyroid disease     Tobacco Use: Social History   Tobacco Use  Smoking Status Never   Passive exposure: Past  Smokeless Tobacco Never    Labs: Review Flowsheet       Latest Ref Rng & Units 06/13/2016 02/07/2018 09/22/2021 12/14/2021  Labs for ITP Cardiac and Pulmonary Rehab  Hemoglobin A1c <7.0 % 6.0  6.0  - -  PH, Arterial 7.35 - 7.45 - - 7.350  7.290   PCO2 arterial 32 - 48 mmHg - - 48.5  60.6   Bicarbonate 20.0 - 28.0 mmol/L - - 27.5  28.8  26.8  29.1   TCO2 22 - 32 mmol/L - - '29  30  28  27  26  30  31   '$ O2 Saturation % - - 71  71  83  96     Capillary Blood Glucose: No results found for: "GLUCAP"   Exercise Target Goals: Exercise Program Goal: Individual exercise prescription set using results from initial 6 min walk test and THRR while considering  patient's activity barriers and safety.   Exercise Prescription Goal: Starting with aerobic activity 30 plus minutes a day, 3 days per week for initial exercise prescription. Provide home exercise prescription and guidelines that participant acknowledges understanding prior to discharge.  Activity Barriers & Risk Stratification:   Activity Barriers & Cardiac Risk Stratification - 01/07/22 1248       Activity Barriers & Cardiac Risk Stratification   Activity Barriers Back Problems;Shortness of Breath;Balance Concerns    Cardiac Risk Stratification High             6 Minute Walk:  6 Minute Walk     Row Name 01/07/22 1352         6 Minute Walk   Phase Initial     Distance 1350 feet     Walk Time 6 minutes     # of Rest Breaks 0     MPH 2.56     METS 3.08     RPE 12     VO2 Peak 10.79     Symptoms No     Resting HR 77 bpm     Resting BP 130/60     Resting Oxygen Saturation  94 %     Exercise Oxygen Saturation  during 6 min walk 95 %     Max Ex. HR 128 bpm     Max Ex. BP 164/74     2 Minute Post BP 120/72              Oxygen Initial Assessment:   Oxygen Re-Evaluation:   Oxygen Discharge (Final Oxygen Re-Evaluation):   Initial Exercise Prescription:  Initial Exercise Prescription - 01/07/22 1300       Date of Initial Exercise RX and Referring Provider   Date 01/07/22    Referring Provider Dr. Ali Lowe    Expected Discharge Date 04/01/22      Treadmill   MPH 2    Grade 0    Minutes 17      NuStep   Level 1    SPM 80    Minutes 22      Prescription Details   Frequency (times per week) 3    Duration Progress to 30 minutes of continuous aerobic without signs/symptoms of physical distress      Intensity   THRR 40-80% of Max Heartrate 56-113    Ratings of Perceived Exertion 11-13    Perceived Dyspnea 0-4      Resistance Training   Training Prescription Yes    Weight 3    Reps 10-15             Perform Capillary Blood Glucose checks as needed.  Exercise Prescription Changes:   Exercise Prescription Changes     Row Name 01/21/22 1300             Response to Exercise   Blood Pressure (Admit) 124/68       Blood Pressure (Exercise) 140/60       Blood Pressure (Exit) 126/60       Heart  Rate (Admit) 60 bpm       Heart Rate (Exercise) 122 bpm       Heart  Rate (Exit) 67 bpm       Rating of Perceived Exertion (Exercise) 13       Duration Continue with 30 min of aerobic exercise without signs/symptoms of physical distress.       Intensity THRR unchanged         Progression   Progression Continue to progress workloads to maintain intensity without signs/symptoms of physical distress.         Resistance Training   Training Prescription Yes       Weight 3       Reps 10-15       Time 10 Minutes         Treadmill   MPH 2       Grade 0       Minutes 17       METs 2.53         NuStep   Level 2       SPM 129       Minutes 22       METs 2.73                Exercise Comments:   Exercise Goals and Review:   Exercise Goals     Row Name 01/07/22 1354 01/25/22 0821           Exercise Goals   Increase Physical Activity Yes Yes      Intervention Provide advice, education, support and counseling about physical activity/exercise needs.;Develop an individualized exercise prescription for aerobic and resistive training based on initial evaluation findings, risk stratification, comorbidities and participant's personal goals. Provide advice, education, support and counseling about physical activity/exercise needs.;Develop an individualized exercise prescription for aerobic and resistive training based on initial evaluation findings, risk stratification, comorbidities and participant's personal goals.      Expected Outcomes Short Term: Attend rehab on a regular basis to increase amount of physical activity.;Long Term: Add in home exercise to make exercise part of routine and to increase amount of physical activity.;Long Term: Exercising regularly at least 3-5 days a week. Short Term: Attend rehab on a regular basis to increase amount of physical activity.;Long Term: Add in home exercise to make exercise part of routine and to increase amount of physical activity.;Long Term: Exercising regularly at least 3-5 days a week.      Increase Strength  and Stamina Yes Yes      Intervention Provide advice, education, support and counseling about physical activity/exercise needs.;Develop an individualized exercise prescription for aerobic and resistive training based on initial evaluation findings, risk stratification, comorbidities and participant's personal goals. Provide advice, education, support and counseling about physical activity/exercise needs.;Develop an individualized exercise prescription for aerobic and resistive training based on initial evaluation findings, risk stratification, comorbidities and participant's personal goals.      Expected Outcomes Short Term: Increase workloads from initial exercise prescription for resistance, speed, and METs.;Short Term: Perform resistance training exercises routinely during rehab and add in resistance training at home;Long Term: Improve cardiorespiratory fitness, muscular endurance and strength as measured by increased METs and functional capacity (6MWT) Short Term: Increase workloads from initial exercise prescription for resistance, speed, and METs.;Short Term: Perform resistance training exercises routinely during rehab and add in resistance training at home;Long Term: Improve cardiorespiratory fitness, muscular endurance and strength as measured by increased METs and functional capacity (6MWT)  Able to understand and use rate of perceived exertion (RPE) scale Yes Yes      Intervention Provide education and explanation on how to use RPE scale Provide education and explanation on how to use RPE scale      Expected Outcomes Short Term: Able to use RPE daily in rehab to express subjective intensity level;Long Term:  Able to use RPE to guide intensity level when exercising independently Short Term: Able to use RPE daily in rehab to express subjective intensity level;Long Term:  Able to use RPE to guide intensity level when exercising independently      Knowledge and understanding of Target Heart Rate  Range (THRR) Yes Yes      Intervention Provide education and explanation of THRR including how the numbers were predicted and where they are located for reference Provide education and explanation of THRR including how the numbers were predicted and where they are located for reference      Expected Outcomes Short Term: Able to state/look up THRR;Short Term: Able to use daily as guideline for intensity in rehab;Long Term: Able to use THRR to govern intensity when exercising independently Short Term: Able to state/look up THRR;Short Term: Able to use daily as guideline for intensity in rehab;Long Term: Able to use THRR to govern intensity when exercising independently      Able to check pulse independently Yes Yes      Intervention Provide education and demonstration on how to check pulse in carotid and radial arteries.;Review the importance of being able to check your own pulse for safety during independent exercise Provide education and demonstration on how to check pulse in carotid and radial arteries.;Review the importance of being able to check your own pulse for safety during independent exercise      Expected Outcomes Short Term: Able to explain why pulse checking is important during independent exercise;Long Term: Able to check pulse independently and accurately Short Term: Able to explain why pulse checking is important during independent exercise;Long Term: Able to check pulse independently and accurately      Understanding of Exercise Prescription Yes Yes      Intervention Provide education, explanation, and written materials on patient's individual exercise prescription Provide education, explanation, and written materials on patient's individual exercise prescription      Expected Outcomes Long Term: Able to explain home exercise prescription to exercise independently;Short Term: Able to explain program exercise prescription Long Term: Able to explain home exercise prescription to exercise  independently;Short Term: Able to explain program exercise prescription               Exercise Goals Re-Evaluation :  Exercise Goals Re-Evaluation     [place] Name 01/25/22 0822             Exercise Goal Re-Evaluation   Exercise Goals Review Increase Physical Activity;Increase Strength and Stamina;Able to understand and use rate of perceived exertion (RPE) scale;Knowledge and understanding of Target Heart Rate Range (THRR);Able to check pulse independently;Understanding of Exercise Prescription       Comments Pt has completed 5 sessions of cardiac rehab. She is very motivated during class and is pushing herself. She has already increased her workloads on the treadmill and stepper. She is currently exercising at 2.72 METs on the stepper. Will continue to monitor and progress as able.       Expected Outcomes Through exercise at rehab and home, the patient will meet their stated goals.  Discharge Exercise Prescription (Final Exercise Prescription Changes):  Exercise Prescription Changes - 01/21/22 1300       Response to Exercise   Blood Pressure (Admit) 124/68    Blood Pressure (Exercise) 140/60    Blood Pressure (Exit) 126/60    Heart Rate (Admit) 60 bpm    Heart Rate (Exercise) 122 bpm    Heart Rate (Exit) 67 bpm    Rating of Perceived Exertion (Exercise) 13    Duration Continue with 30 min of aerobic exercise without signs/symptoms of physical distress.    Intensity THRR unchanged      Progression   Progression Continue to progress workloads to maintain intensity without signs/symptoms of physical distress.      Resistance Training   Training Prescription Yes    Weight 3    Reps 10-15    Time 10 Minutes      Treadmill   MPH 2    Grade 0    Minutes 17    METs 2.53      NuStep   Level 2    SPM 129    Minutes 22    METs 2.73             Nutrition:  Target Goals: Understanding of nutrition guidelines, daily intake of sodium '1500mg'$ ,  cholesterol '200mg'$ , calories 30% from fat and 7% or less from saturated fats, daily to have 5 or more servings of fruits and vegetables.  Biometrics:  Pre Biometrics - 01/07/22 1355       Pre Biometrics   Height '5\' 4"'$  (1.626 m)    Weight 69.8 kg    Waist Circumference 36.5 inches    Hip Circumference 39 inches    Waist to Hip Ratio 0.94 %    BMI (Calculated) 26.4    Triceps Skinfold 26 mm    % Body Fat 39.3 %    Grip Strength 22.1 kg    Flexibility 7 in    Single Leg Stand 5.14 seconds              Nutrition Therapy Plan and Nutrition Goals:  Nutrition Therapy & Goals - 01/07/22 1323       Personal Nutrition Goals   Comments Patient scored 22 on her diet assessment. Handout explained and provided regarding heathier choices. We offer 2 educational sessions on heart healthy nutrition with handouts and assistance with RD referral if patient is interested.      Intervention Plan   Intervention Nutrition handout(s) given to patient.    Expected Outcomes Short Term Goal: Understand basic principles of dietary content, such as calories, fat, sodium, cholesterol and nutrients.             Nutrition Assessments:  Nutrition Assessments - 01/07/22 1323       MEDFICTS Scores   Pre Score 22            MEDIFICTS Score Key: ?70 Need to make dietary changes  40-70 Heart Healthy Diet ? 40 Therapeutic Level Cholesterol Diet   Picture Your Plate Scores: <54 Unhealthy dietary pattern with much room for improvement. 41-50 Dietary pattern unlikely to meet recommendations for good health and room for improvement. 51-60 More healthful dietary pattern, with some room for improvement.  >60 Healthy dietary pattern, although there may be some specific behaviors that could be improved.    Nutrition Goals Re-Evaluation:   Nutrition Goals Discharge (Final Nutrition Goals Re-Evaluation):   Psychosocial: Target Goals: Acknowledge presence or absence of significant depression  and/or stress,  maximize coping skills, provide positive support system. Participant is able to verbalize types and ability to use techniques and skills needed for reducing stress and depression.  Initial Review & Psychosocial Screening:  Initial Psych Review & Screening - 01/07/22 1335       Initial Review   Current issues with None Identified      Family Dynamics   Good Support System? Yes      Barriers   Psychosocial barriers to participate in program There are no identifiable barriers or psychosocial needs.      Screening Interventions   Interventions Encouraged to exercise;Provide feedback about the scores to participant    Expected Outcomes Short Term goal: Utilizing psychosocial counselor, staff and physician to assist with identification of specific Stressors or current issues interfering with healing process. Setting desired goal for each stressor or current issue identified.             Quality of Life Scores:  Quality of Life - 01/07/22 1352       Quality of Life   Select Quality of Life      Quality of Life Scores   Health/Function Pre 22.71 %    Socioeconomic Pre 30 %    Psych/Spiritual Pre 26.57 %    Family Pre 26.3 %    GLOBAL Pre 25.48 %            Scores of 19 and below usually indicate a poorer quality of life in these areas.  A difference of  2-3 points is a clinically meaningful difference.  A difference of 2-3 points in the total score of the Quality of Life Index has been associated with significant improvement in overall quality of life, self-image, physical symptoms, and general health in studies assessing change in quality of life.  PHQ-9: Review Flowsheet  More data exists      01/07/2022 12/17/2021 06/10/2021 05/27/2021 02/03/2021  Depression screen PHQ 2/9  Decreased Interest 0 0 0 0 0  Down, Depressed, Hopeless 0 0 0 0 0  PHQ - 2 Score 0 0 0 0 0  Altered sleeping 1 - - - -  Tired, decreased energy 1 - - - -  Change in appetite 0 - - - -   Feeling bad or failure about yourself  0 - - - -  Trouble concentrating 0 - - - -  Moving slowly or fidgety/restless 0 - - - -  Suicidal thoughts 0 - - - -  PHQ-9 Score 2 - - - -  Difficult doing work/chores Somewhat difficult - - - -   Interpretation of Total Score  Total Score Depression Severity:  1-4 = Minimal depression, 5-9 = Mild depression, 10-14 = Moderate depression, 15-19 = Moderately severe depression, 20-27 = Severe depression   Psychosocial Evaluation and Intervention:  Psychosocial Evaluation - 01/07/22 1335       Psychosocial Evaluation & Interventions   Interventions Stress management education;Relaxation education;Encouraged to exercise with the program and follow exercise prescription    Comments Patient has no psychosocial barriers or issues identified at her orientation visit. Her PHQ-9 score was 2 due to lack of energy and trouble falling asleep at timess. She is a very pleasant 81 year old female who lives with her husband of many years. They have 3 children, 5 grandchildren and 1 great grandchild that all live near. She says her children and grandchildren are very supportative of she and her husband and she feels very blessed to have them. Her husband  is currently doing our PR program. She is active around the house and is currently walking 3 days/week but wants to do more exercising. She is ready to start the program hoping to gain strength and stamina and have less SOB.    Expected Outcomes Patient will continue to have no psychosocial barriers or issues identified.    Continue Psychosocial Services  No Follow up required             Psychosocial Re-Evaluation:  Psychosocial Re-Evaluation     [place] Name 01/19/22 1425             Psychosocial Re-Evaluation   Current issues with None Identified       Comments Patient is new to the program comleting 4 sessions. She continues to have no psychosocial barriers or issues identified. She seems to enjoy the  sessions and demonstrates an interest to improve her health.       Expected Outcomes Patient will continue to have no psychosocial barriers or issues identified.       Interventions Stress management education;Relaxation education;Encouraged to attend Cardiac Rehabilitation for the exercise       Continue Psychosocial Services  No Follow up required                Psychosocial Discharge (Final Psychosocial Re-Evaluation):  Psychosocial Re-Evaluation - 01/19/22 1425       Psychosocial Re-Evaluation   Current issues with None Identified    Comments Patient is new to the program comleting 4 sessions. She continues to have no psychosocial barriers or issues identified. She seems to enjoy the sessions and demonstrates an interest to improve her health.    Expected Outcomes Patient will continue to have no psychosocial barriers or issues identified.    Interventions Stress management education;Relaxation education;Encouraged to attend Cardiac Rehabilitation for the exercise    Continue Psychosocial Services  No Follow up required             Vocational Rehabilitation: Provide vocational rehab assistance to qualifying candidates.   Vocational Rehab Evaluation & Intervention:  Vocational Rehab - 01/07/22 1325       Vocational Rehab Re-[facility]   Comments Patient is retired and does not need vocational rehab.             Education: Education Goals: Education classes will be provided on a weekly basis, covering required topics. Participant will state understanding/return demonstration of topics presented.  Learning Barriers/Preferences:  Learning Barriers/Preferences - 01/07/22 1325       Learning Barriers/Preferences   Learning Barriers None    Learning Preferences Written Material;Skilled Demonstration;Pictoral             Education Topics: Hypertension, Hypertension Reduction -Define heart disease and high blood pressure. Discus how high blood pressure  affects the body and ways to reduce high blood pressure.   Exercise and Your Heart -Discuss why it is important to exercise, the FITT principles of exercise, normal and abnormal responses to exercise, and how to exercise safely.   Angina -Discuss definition of angina, causes of angina, treatment of angina, and how to decrease risk of having angina.   Cardiac Medications -Review what the following cardiac medications are used for, how they affect the body, and side effects that may occur when taking the medications.  Medications include Aspirin, Beta blockers, calcium channel blockers, ACE Inhibitors, angiotensin receptor blockers, diuretics, digoxin, and antihyperlipidemics. Flowsheet Row CARDIAC REHAB PHASE II EXERCISE from 01/19/2022 in [facility]  Date 01/12/22  Educator hj  Instruction Review Code 1- Verbalizes Understanding       Congestive Heart Failure -Discuss the definition of CHF, how to live with CHF, the signs and symptoms of CHF, and how keep track of weight and sodium intake. Flowsheet Row CARDIAC REHAB PHASE II EXERCISE from 01/19/2022 in [facility]  Date 01/19/22  Educator HB  Instruction Review Code 1- Verbalizes Understanding       Heart Disease and Intimacy -Discus the effect sexual activity has on the heart, how changes occur during intimacy as we age, and safety during sexual activity.   Smoking Cessation / COPD -Discuss different methods to quit smoking, the health benefits of quitting smoking, and the definition of COPD.   Nutrition I: Fats -Discuss the types of cholesterol, what cholesterol does to the heart, and how cholesterol levels can be controlled.   Nutrition II: Labels -Discuss the different components of food labels and how to read food label   Heart Parts/Heart Disease and PAD -Discuss the anatomy of the heart, the pathway of blood circulation through the heart, and these are affected by  heart disease.   Stress I: Signs and Symptoms -Discuss the causes of stress, how stress may lead to anxiety and depression, and ways to limit stress.   Stress II: Relaxation -Discuss different types of relaxation techniques to limit stress.   Warning Signs of Stroke / TIA -Discuss definition of a stroke, what the signs and symptoms are of a stroke, and how to identify when someone is having stroke.   Knowledge Questionnaire Score:  Knowledge Questionnaire Score - 01/07/22 1323       Knowledge Questionnaire Score   Pre Score 22/24             Core Components/Risk Factors/Patient Goals at Admission:  Personal Goals and Risk Factors at Admission - 01/07/22 1325       Core Components/Risk Factors/Patient Goals on Admission    Weight Management Weight Maintenance    Improve shortness of breath with ADL's Yes    Intervention Provide education, individualized exercise plan and daily activity instruction to help decrease symptoms of SOB with activities of daily living.    Expected Outcomes Short Term: Improve cardiorespiratory fitness to achieve a reduction of symptoms when performing ADLs;Long Term: Be able to perform more ADLs without symptoms or delay the onset of symptoms    Hypertension Yes    Intervention Provide education on lifestyle modifcations including regular physical activity/exercise, weight management, moderate sodium restriction and increased consumption of fresh fruit, vegetables, and low fat dairy, alcohol moderation, and smoking cessation.;Monitor prescription use compliance.    Expected Outcomes Short Term: Continued assessment and intervention until BP is < 140/76m HG in hypertensive participants. < 130/847mHG in hypertensive participants with diabetes, heart failure or chronic kidney disease.;Long Term: Maintenance of blood pressure at goal levels.    Personal Goal Other Yes    Personal Goal Patient wants to improve her strenght and stamina and have less SOB.     Intervention Patient will attend CR 3 days/week with exercise and education.    Expected Outcomes Patient will complete the program meeting both personal and program goals.             Core Components/Risk Factors/Patient Goals Review:   Goals and Risk Factor Review     Row Name 01/19/22 1427             Core Components/Risk Factors/Patient Goals Review   Personal Goals Review  Weight Management/Obesity;Hypertension;Other       Review Patient was referred to CR with [facility]. She has multiple risk factors for CAD and is participating in the program for risk modification. She has completed 4 sessions. Her current weight is 154.0 maintained from her initial visit. She say Kathyrn Drown, PA with cardiology 10/30. Patient is having Bigeminy during her walk test and when walking on the treadmill. I sent a message to Kathyrn Drown, PA the morning of patient's visit and sent telemetry strips with patient. Office note has not been completed. Patient says she increased her Metoprolol from 25 mg BID to 50 mg BID. Patient's personal goals for the program are to improve her strength and stamina and have less SOB. We will continue to monitor her progress as she works towards meeting these goals.       Expected Outcomes Patient will complete the program meeting both personal and program goals.                Core Components/Risk Factors/Patient Goals at Discharge (Final Review):   Goals and Risk Factor Review - 01/19/22 1427       Core Components/Risk Factors/Patient Goals Review   Personal Goals Review Weight Management/Obesity;Hypertension;Other    Review Patient was referred to CR with [facility]. She has multiple risk factors for CAD and is participating in the program for risk modification. She has completed 4 sessions. Her current weight is 154.0 maintained from her initial visit. She say Kathyrn Drown, PA with cardiology 10/30. Patient is having Bigeminy during her walk test and when walking on  the treadmill. I sent a message to Kathyrn Drown, PA the morning of patient's visit and sent telemetry strips with patient. Office note has not been completed. Patient says she increased her Metoprolol from 25 mg BID to 50 mg BID. Patient's personal goals for the program are to improve her strength and stamina and have less SOB. We will continue to monitor her progress as she works towards meeting these goals.    Expected Outcomes Patient will complete the program meeting both personal and program goals.             ITP Comments:   Comments: ITP REVIEW Pt is making expected progress toward Cardiac Rehab goals after completing 5 sessions. Recommend continued exercise, life style modification, education, and increased stamina and strength.

## 2022-01-26 NOTE — Progress Notes (Signed)
Daily Session Note  Patient Details  Name: Autumn Moyer MRN: [id number] Date of Birth: 27-Sep-1940 Referring Provider:   Flowsheet Row CARDIAC REHAB PHASE II ORIENTATION from 01/07/2022 in [place]  Referring Provider Dr. Ali Lowe       Encounter Date: 01/26/2022  Check In:  Session Check In - 01/26/22 1100       Check-In   Supervising physician immediately available to respond to emergencies [facility] MD immediately available    Physician(s) Dr. Dellia Cloud    Location [facility]    Staff Present Leana Roe, BS, Exercise Physiologist;Dalton Sherrie George, MS, ACSM-CEP    Virtual Visit No    Medication changes reported     No    Fall or balance concerns reported    Yes    Comments Patient reports frequently losing her balance.    Tobacco Cessation No Change    Warm-up and Cool-down Performed as group-led instruction    Resistance Training Performed Yes    VAD Patient? No    PAD/SET Patient? No      Pain Assessment   Currently in Pain? No/denies    Pain Score 0-No pain    Multiple Pain Sites No             Capillary Blood Glucose: No results found for this or any previous visit (from the past 24 hour(s)).    Social History   Tobacco Use  Smoking Status Never   Passive exposure: Past  Smokeless Tobacco Never    Goals Met:  Independence with exercise equipment Exercise tolerated well No report of concerns or symptoms today Strength training completed today  Goals Unmet:  Not Applicable  Comments: check out 1200   Dr. Carlyle Dolly is Medical Director for [place]

## 2022-01-28 ENCOUNTER — Encounter (HOSPITAL_COMMUNITY): Payer: PPO

## 2022-01-28 DIAGNOSIS — L820 Inflamed seborrheic keratosis: Secondary | ICD-10-CM | POA: Diagnosis not present

## 2022-01-28 DIAGNOSIS — Z1283 Encounter for screening for malignant neoplasm of skin: Secondary | ICD-10-CM | POA: Diagnosis not present

## 2022-01-28 DIAGNOSIS — L821 Other seborrheic keratosis: Secondary | ICD-10-CM | POA: Diagnosis not present

## 2022-01-28 DIAGNOSIS — L570 Actinic keratosis: Secondary | ICD-10-CM | POA: Diagnosis not present

## 2022-01-28 DIAGNOSIS — D225 Melanocytic nevi of trunk: Secondary | ICD-10-CM | POA: Diagnosis not present

## 2022-01-28 DIAGNOSIS — X32XXXA Exposure to sunlight, initial encounter: Secondary | ICD-10-CM | POA: Diagnosis not present

## 2022-01-31 ENCOUNTER — Encounter (HOSPITAL_COMMUNITY)
Admission: RE | Admit: 2022-01-31 | Discharge: 2022-01-31 | Disposition: A | Discharge status: Home / Self Care | Payer: PPO | Source: Ambulatory Visit | Attending: Internal Medicine | Admitting: Internal Medicine

## 2022-01-31 DIAGNOSIS — Z952 Presence of prosthetic heart valve: Secondary | ICD-10-CM

## 2022-01-31 NOTE — Progress Notes (Signed)
Daily Session Note  Patient Details  Name: Autumn Moyer MRN: [id number] Date of Birth: January 20, 1941 Referring Provider:   Flowsheet Row CARDIAC REHAB PHASE II ORIENTATION from 01/07/2022 in [place]  Referring Provider Dr. Ali Lowe       Encounter Date: 01/31/2022  Check In:  Session Check In - 01/31/22 1100       Check-In   Supervising physician immediately available to respond to emergencies [facility] MD immediately available    Physician(s) Dr Harl Bowie    Location [facility]    Staff Present Leana Roe, BS, Exercise Physiologist;Elizet Kaplan Hassell Done, RN, BSN    Virtual Visit No    Medication changes reported     No    Fall or balance concerns reported    Yes    Comments Patient reports frequently losing her balance.    Tobacco Cessation No Change    Warm-up and Cool-down Performed as group-led instruction    Resistance Training Performed Yes    VAD Patient? No    PAD/SET Patient? No      Pain Assessment   Currently in Pain? No/denies    Pain Score 0-No pain    Multiple Pain Sites No             Capillary Blood Glucose: No results found for this or any previous visit (from the past 24 hour(s)).    Social History   Tobacco Use  Smoking Status Never   Passive exposure: Past  Smokeless Tobacco Never    Goals Met:  Independence with exercise equipment Exercise tolerated well No report of concerns or symptoms today Strength training completed today  Goals Unmet:  Not Applicable  Comments: Checkout at 1200.   Dr. Carlyle Dolly is Medical Director for [facility]

## 2022-02-02 ENCOUNTER — Encounter (HOSPITAL_COMMUNITY)
Admission: RE | Admit: 2022-02-02 | Discharge: 2022-02-02 | Disposition: A | Discharge status: Home / Self Care | Payer: PPO | Source: Ambulatory Visit | Attending: Internal Medicine | Admitting: Internal Medicine

## 2022-02-02 DIAGNOSIS — Z952 Presence of prosthetic heart valve: Secondary | ICD-10-CM | POA: Diagnosis not present

## 2022-02-02 NOTE — Progress Notes (Signed)
Daily Session Note  Patient Details  Name: Autumn Moyer MRN: [id number] Date of Birth: 1940/08/20 Referring Provider:   Flowsheet Row CARDIAC REHAB PHASE II ORIENTATION from 01/07/2022 in [place]  Referring Provider Dr. Ali Lowe       Encounter Date: 02/02/2022  Check In:  Session Check In - 02/02/22 1436       Check-In   Supervising physician immediately available to respond to emergencies [facility] immediately available    Physician(s) Dr Harl Bowie    Location [facility]    Staff Present Leana Roe, BS, Exercise Physiologist;Hendrick Pavich Hassell Done, RN, BSN;Dalton Sherrie George, MS, ACSM-CEP    Virtual Visit No    Medication changes reported     No    Fall or balance concerns reported    Yes    Comments Patient reports frequently losing her balance.    Tobacco Cessation No Change    Warm-up and Cool-down Performed as group-led instruction    Resistance Training Performed Yes    VAD Patient? No    PAD/SET Patient? No      Pain Assessment   Currently in Pain? No/denies    Pain Score 0-No pain    Multiple Pain Sites No             Capillary Blood Glucose: No results found for this or any previous visit (from the past 24 hour(s)).    Social History   Tobacco Use  Smoking Status Never   Passive exposure: Past  Smokeless Tobacco Never    Goals Met:  Independence with exercise equipment Exercise tolerated well No report of concerns or symptoms today Strength training completed today  Goals Unmet:  Not Applicable  Comments: Checkout at 1545.   Dr. Carlyle Dolly is Medical Director for [facility]

## 2022-02-04 ENCOUNTER — Encounter (HOSPITAL_COMMUNITY)
Admission: RE | Admit: 2022-02-04 | Discharge: 2022-02-04 | Disposition: A | Discharge status: Home / Self Care | Payer: PPO | Source: Ambulatory Visit | Attending: Internal Medicine | Admitting: Internal Medicine

## 2022-02-04 DIAGNOSIS — Z952 Presence of prosthetic heart valve: Secondary | ICD-10-CM

## 2022-02-04 NOTE — Progress Notes (Signed)
Daily Session Note  Patient Details  Name: Autumn Moyer MRN: [id number] Date of Birth: [birth year] Referring Provider:   Flowsheet Row CARDIAC REHAB PHASE II ORIENTATION from 01/07/2022 in [place]  Referring Provider Dr. Ali Lowe       Encounter Date: 02/04/2022  Check In:  Session Check In - 02/04/22 1100       Check-In   Supervising physician immediately available to respond to emergencies [facility] immediately available    Physician(s) Dr Harl Bowie    Location [facility]    Staff Present Leana Roe, BS, Exercise Physiologist;Daphyne Hassell Done, RN, Jennye Moccasin, RN, BSN;Brileigh Sevcik, RN    Virtual Visit No    Medication changes reported     No    Fall or balance concerns reported    Yes    Comments Patient reports frequently losing her balance.    Tobacco Cessation No Change    Warm-up and Cool-down Performed as group-led instruction    Resistance Training Performed Yes    VAD Patient? No    PAD/SET Patient? No      Pain Assessment   Currently in Pain? No/denies    Pain Score 0-No pain    Multiple Pain Sites No             Capillary Blood Glucose: No results found for this or any previous visit (from the past 24 hour(s)).    Social History   Tobacco Use  Smoking Status Never   Passive exposure: Past  Smokeless Tobacco Never    Goals Met:  Independence with exercise equipment Exercise tolerated well No report of concerns or symptoms today Strength training completed today  Goals Unmet:  Not Applicable  Comments: check out @ 12:00   Dr. Carlyle Dolly is Medical Director for [place]

## 2022-02-07 ENCOUNTER — Encounter (HOSPITAL_COMMUNITY)
Admission: RE | Admit: 2022-02-07 | Discharge: 2022-02-07 | Disposition: A | Discharge status: Home / Self Care | Payer: PPO | Source: Ambulatory Visit | Attending: Internal Medicine | Admitting: Internal Medicine

## 2022-02-07 DIAGNOSIS — Z952 Presence of prosthetic heart valve: Secondary | ICD-10-CM | POA: Diagnosis not present

## 2022-02-07 NOTE — Progress Notes (Signed)
Daily Session Note  Patient Details  Name: Autumn Moyer MRN: [id number] Date of Birth: Apr 28, 1940 Referring Provider:   Flowsheet Row CARDIAC REHAB PHASE II ORIENTATION from 01/07/2022 in [place]  Referring Provider Dr. Ali Lowe       Encounter Date: 02/07/2022  Check In:  Session Check In - 02/07/22 1059       Check-In   Supervising physician immediately available to respond to emergencies [facility] immediately available    Physician(s) Dr. Domenic Polite    Location [facility]    Staff Present Aundra Dubin, RN, Joanette Gula, RN, BSN;Heather Mel Almond, BS, Exercise Physiologist    Virtual Visit No    Medication changes reported     No    Fall or balance concerns reported    Yes    Comments Patient reports frequently losing her balance.    Tobacco Cessation No Change    Warm-up and Cool-down Performed as group-led instruction    Resistance Training Performed Yes    VAD Patient? No    PAD/SET Patient? No      Pain Assessment   Currently in Pain? No/denies    Pain Score 0-No pain    Multiple Pain Sites No             Capillary Blood Glucose: No results found for this or any previous visit (from the past 24 hour(s)).    Social History   Tobacco Use  Smoking Status Never   Passive exposure: Past  Smokeless Tobacco Never    Goals Met:  Independence with exercise equipment Exercise tolerated well No report of concerns or symptoms today Strength training completed today  Goals Unmet:  Not Applicable  Comments: Check out 1200.   Dr. Carlyle Dolly is Medical Director for [facility] Card[facility] Re[facility]

## 2022-02-09 ENCOUNTER — Encounter (HOSPITAL_COMMUNITY): Payer: PPO

## 2022-02-11 ENCOUNTER — Encounter (HOSPITAL_COMMUNITY): Payer: PPO

## 2022-02-14 ENCOUNTER — Encounter (HOSPITAL_COMMUNITY)
Admission: RE | Admit: 2022-02-14 | Discharge: 2022-02-14 | Disposition: A | Discharge status: Home / Self Care | Payer: PPO | Source: Ambulatory Visit | Attending: Internal Medicine | Admitting: Internal Medicine

## 2022-02-14 DIAGNOSIS — Z952 Presence of prosthetic heart valve: Secondary | ICD-10-CM

## 2022-02-14 NOTE — Progress Notes (Signed)
Daily Session Note  Patient Details  Name: Autumn Moyer MRN: [id number] Date of Birth: 06/15/1940 Referring Provider:   Flowsheet Row CARDIAC REHAB PHASE II ORIENTATION from 01/07/2022 in [place]  Referring Provider Dr. Ali Lowe       Encounter Date: 02/14/2022  Check In:  Session Check In - 02/14/22 1057       Check-In   Supervising physician immediately available to respond to emergencies [facility] immediately available    Physician(s) Dr Dellia Cloud    Location [facility]    Staff Present Marchia Diguglielmo Hassell Done, RN, BSN;Heather Mel Almond, BS, Exercise Physiologist;Dalton Sherrie George, MS, ACSM-CEP    Virtual Visit No    Medication changes reported     No    Fall or balance concerns reported    Yes    Comments Patient reports frequently losing her balance.    Tobacco Cessation No Change    Warm-up and Cool-down Performed as group-led instruction    Resistance Training Performed Yes    VAD Patient? No    PAD/SET Patient? No      Pain Assessment   Currently in Pain? No/denies    Pain Score 0-No pain    Multiple Pain Sites No             Capillary Blood Glucose: No results found for this or any previous visit (from the past 24 hour(s)).    Social History   Tobacco Use  Smoking Status Never   Passive exposure: Past  Smokeless Tobacco Never    Goals Met:  Independence with exercise equipment Exercise tolerated well No report of concerns or symptoms today Strength training completed today  Goals Unmet:  Not Applicable  Comments: Checkout at 1200.   Dr. Carlyle Dolly is Medical Director for [facility] Card[facility]

## 2022-02-16 ENCOUNTER — Encounter (HOSPITAL_COMMUNITY): Payer: PPO | Admitting: Dentistry

## 2022-02-16 ENCOUNTER — Encounter (HOSPITAL_COMMUNITY)
Admission: RE | Admit: 2022-02-16 | Discharge: 2022-02-16 | Disposition: A | Discharge status: Home / Self Care | Payer: PPO | Source: Ambulatory Visit | Attending: Internal Medicine | Admitting: Internal Medicine

## 2022-02-16 DIAGNOSIS — Z952 Presence of prosthetic heart valve: Secondary | ICD-10-CM

## 2022-02-16 NOTE — Progress Notes (Signed)
Daily Session Note  Patient Details  Name: Autumn Moyer MRN: [id number] Date of Birth: [birth year] Referring Provider:   Flowsheet Row CARDIAC REHAB PHASE II ORIENTATION from 01/07/2022 in [place]  Referring Provider Dr. Ali Lowe       Encounter Date: 02/16/2022  Check In:  Session Check In - 02/16/22 0930       Check-In   Supervising physician immediately available to respond to emergencies [facility] immediately available    Physician(s) Dr Dellia Cloud    Location [facility]    Staff Present Leana Roe, BS, Exercise Physiologist;Dalton Sherrie George, MS, ACSM-CEP;Melven Sartorius BSN, RN    Virtual Visit No    Medication changes reported     No    Fall or balance concerns reported    Yes    Comments Patient reports frequently losing her balance.    Tobacco Cessation No Change    Warm-up and Cool-down Performed as group-led instruction    Resistance Training Performed Yes    VAD Patient? No    PAD/SET Patient? No      Pain Assessment   Currently in Pain? No/denies    Pain Score 0-No pain    Multiple Pain Sites No             Capillary Blood Glucose: No results found for this or any previous visit (from the past 24 hour(s)).    Social History   Tobacco Use  Smoking Status Never   Passive exposure: Past  Smokeless Tobacco Never    Goals Met:  Independence with exercise equipment Exercise tolerated well No report of concerns or symptoms today Strength training completed today  Goals Unmet:  Not Applicable  Comments: check out at 10:30   Dr. Carlyle Dolly is Medical Director for [place]

## 2022-02-18 ENCOUNTER — Encounter (HOSPITAL_COMMUNITY)
Admission: RE | Admit: 2022-02-18 | Discharge: 2022-02-18 | Disposition: A | Discharge status: Home / Self Care | Payer: PPO | Source: Ambulatory Visit | Attending: Internal Medicine | Admitting: Internal Medicine

## 2022-02-18 DIAGNOSIS — Z952 Presence of prosthetic heart valve: Secondary | ICD-10-CM | POA: Diagnosis not present

## 2022-02-21 ENCOUNTER — Encounter (HOSPITAL_COMMUNITY)
Admission: RE | Admit: 2022-02-21 | Discharge: 2022-02-21 | Disposition: A | Discharge status: Home / Self Care | Payer: PPO | Source: Ambulatory Visit | Attending: Internal Medicine | Admitting: Internal Medicine

## 2022-02-21 VITALS — Wt 153.2 lb

## 2022-02-21 DIAGNOSIS — Z952 Presence of prosthetic heart valve: Secondary | ICD-10-CM

## 2022-02-21 NOTE — Progress Notes (Signed)
I have reviewed a Home Exercise Prescription with [name] . Elga is not currently exercising at home but is planning on walking for her exercise at home.  The patient was advised to walk 2 days a week for 30-45 minutes.  Natelie and I discussed how to progress their exercise prescription.  The patient stated that their goals were build up her strength and endurance.  The patient stated that they understand the exercise prescription.  We reviewed exercise guidelines, target heart rate during exercise, RPE Scale, weather conditions, NTG use, endpoints for exercise, warmup and cool down.  Patient is encouraged to come to me with any questions. I will continue to follow up with the patient to assist them with progression and safety.

## 2022-02-21 NOTE — Progress Notes (Signed)
Daily Session Note  Patient Details  Name: Autumn Moyer MRN: [id number] Date of Birth: [birth year] Referring Provider:   Flowsheet Row CARDIAC REHAB PHASE II ORIENTATION from 01/07/2022 in [place]  Referring Provider Dr. Ali Lowe       Encounter Date: 02/21/2022  Check In:  Session Check In - 02/21/22 1057       Check-In   Supervising physician immediately available to respond to emergencies [facility] MD immediately available    Physician(s) Dr Harl Bowie    Location [facility]    Staff Present Leana Roe, BS, Exercise Physiologist;Janeisha Ryle Hassell Done, RN, Jennye Moccasin, RN, BSN    Virtual Visit No    Medication changes reported     No    Fall or balance concerns reported    Yes    Comments Patient reports frequently losing her balance.    Tobacco Cessation No Change    Warm-up and Cool-down Performed as group-led instruction    Resistance Training Performed Yes    VAD Patient? No    PAD/SET Patient? No      Pain Assessment   Currently in Pain? No/denies    Pain Score 0-No pain    Multiple Pain Sites No             Capillary Blood Glucose: No results found for this or any previous visit (from the past 24 hour(s)).    Social History   Tobacco Use  Smoking Status Never   Passive exposure: Past  Smokeless Tobacco Never    Goals Met:  Independence with exercise equipment Exercise tolerated well No report of concerns or symptoms today Strength training completed today  Goals Unmet:  Not Applicable  Comments: Checkout at 1200.   Dr. Carlyle Dolly is Medical Director for [facility] Card[facility] Re[facility]

## 2022-02-22 ENCOUNTER — Encounter (HOSPITAL_COMMUNITY): Payer: PPO | Admitting: Dentistry

## 2022-02-23 ENCOUNTER — Encounter (HOSPITAL_COMMUNITY): Payer: PPO

## 2022-02-23 NOTE — Progress Notes (Signed)
Cardiac Individual Treatment Plan  Patient Details  Name: Autumn Moyer MRN: [id number] Date of Birth: [birth year] Referring Provider:   Flowsheet Row CARDIAC REHAB PHASE II ORIENTATION from 01/07/2022 in [facility]  Referring Provider Dr. Ali Lowe       Initial Encounter Date:  Flowsheet Row CARDIAC REHAB PHASE II ORIENTATION from 01/07/2022 in [facility]  Date 01/07/22       Visit Diagnosis: S/P TAVR (transcatheter aortic valve replacement)  Patient's Home Medications on Admission:  Current Outpatient Medications:    acetaminophen (TYLENOL) 500 MG tablet, Take 1,000 mg by mouth in the morning., Disp: , Rfl:    apixaban (ELIQUIS) 5 MG TABS tablet, Take 1 tablet (5 mg total) by mouth 2 (two) times daily., Disp: 180 tablet, Rfl: 1   azithromycin (ZITHROMAX) 500 MG tablet, Take 1 tablet by mouth 1 hour before dental cleanings and procedures, Disp: 12 tablet, Rfl: 6   benazepril-hydrochlorthiazide (LOTENSIN HCT) 20-25 MG tablet, Take 1 tablet by mouth daily., Disp: 90 tablet, Rfl: 1   carboxymethylcellulose (REFRESH TEARS) 0.5 % SOLN, Place 2 drops into both eyes See admin instructions. Instill 2 drops into both eyes scheduled every morning & as needed for dry/irritated eyes., Disp: , Rfl:    digoxin (LANOXIN) 0.125 MG tablet, Take 1 tablet (0.125 mg total) by mouth daily., Disp: 90 tablet, Rfl: 3   furosemide (LASIX) 20 MG tablet, Take 20 mg by mouth daily as needed for edema or fluid., Disp: , Rfl:    levocetirizine (XYZAL) 5 MG tablet, Take 1 tablet (5 mg total) by mouth every evening., Disp: 90 tablet, Rfl: 1   levothyroxine (SYNTHROID) 88 MCG tablet, Take 1 tablet (88 mcg total) by mouth daily., Disp: 90 tablet, Rfl: 3   metoprolol tartrate (LOPRESSOR) 50 MG tablet, Take 1 tablet (50 mg total) by mouth 2 (two) times daily., Disp: 180 tablet, Rfl: 3   mometasone (ELOCON) 0.1 % cream, Apply 1 application topically daily., Disp: 45 g, Rfl:  2   Multiple Vitamin (MULTIVITAMIN WITH MINERALS) TABS tablet, Take 1 tablet by mouth every evening., Disp: , Rfl:    pantoprazole (PROTONIX) 40 MG tablet, Take 1 tablet (40 mg total) by mouth daily., Disp: 90 tablet, Rfl: 1   tacrolimus (PROTOPIC) 0.1 % ointment, Apply topically in the morning and at bedtime., Disp: 30 g, Rfl: 4  Past Medical History: Past Medical History:  Diagnosis Date   Atrial fibrillation, persistent (HCC)    Complication of anesthesia    Hiatal hernia    Hypertension    PONV (postoperative nausea and vomiting)    S/P TAVR (transcatheter aortic valve replacement) 12/14/2021   s/p TAVR with a 43m Edwards S3UR via the TF approach by Dr. TAli Lowe& Dr. WLavonna Monarch  Severe aortic stenosis    Squamous cell carcinoma of skin 01/16/2018   in situ-right knee post (txpbx)   Thyroid disease     Tobacco Use: Social History   Tobacco Use  Smoking Status Never   Passive exposure: Past  Smokeless Tobacco Never    Labs: Review Flowsheet       Latest Ref Rng & Units 06/13/2016 02/07/2018 09/22/2021 12/14/2021  Labs for ITP Cardiac and Pulmonary Rehab  Hemoglobin A1c <7.0 % 6.0  6.0  - -  PH, Arterial 7.35 - 7.45 - - 7.350  7.290   PCO2 arterial 32 - 48 mmHg - - 48.5  60.6   Bicarbonate 20.0 - 28.0 mmol/L - - 27.5  28.8  26.8  29.1   TCO2 22 - 32 mmol/L - - '29  30  28  27  26  30  31   '$ O2 Saturation % - - 71  71  83  96     Capillary Blood Glucose: No results found for: "GLUCAP"   Exercise Target Goals: Exercise Program Goal: Individual exercise prescription set using results from initial 6 min walk test and THRR while considering  patient's activity barriers and safety.   Exercise Prescription Goal: Starting with aerobic activity 30 plus minutes a day, 3 days per week for initial exercise prescription. Provide home exercise prescription and guidelines that participant acknowledges understanding prior to discharge.  Activity Barriers & Risk Stratification:   Activity Barriers & Cardiac Risk Stratification - 01/07/22 1248       Activity Barriers & Cardiac Risk Stratification   Activity Barriers Back Problems;Shortness of Breath;Balance Concerns    Cardiac Risk Stratification High             6 Minute Walk:  6 Minute Walk     Row Name 01/07/22 1352         6 Minute Walk   Phase Initial     Distance 1350 feet     Walk Time 6 minutes     # of Rest Breaks 0     MPH 2.56     METS 3.08     RPE 12     VO2 Peak 10.79     Symptoms No     Resting HR 77 bpm     Resting BP 130/60     Resting Oxygen Saturation  94 %     Exercise Oxygen Saturation  during 6 min walk 95 %     Max Ex. HR 128 bpm     Max Ex. BP 164/74     2 Minute Post BP 120/72              Oxygen Initial Assessment:   Oxygen Re-Evaluation:   Oxygen Discharge (Final Oxygen Re-Evaluation):   Initial Exercise Prescription:  Initial Exercise Prescription - 01/07/22 1300       Date of Initial Exercise RX and Referring Provider   Date 01/07/22    Referring Provider Dr. Ali Lowe    Expected Discharge Date 04/01/22      Treadmill   MPH 2    Grade 0    Minutes 17      NuStep   Level 1    SPM 80    Minutes 22      Prescription Details   Frequency (times per week) 3    Duration Progress to 30 minutes of continuous aerobic without signs/symptoms of physical distress      Intensity   THRR 40-80% of Max Heartrate 56-113    Ratings of Perceived Exertion 11-13    Perceived Dyspnea 0-4      Resistance Training   Training Prescription Yes    Weight 3    Reps 10-15             Perform Capillary Blood Glucose checks as needed.  Exercise Prescription Changes:   Exercise Prescription Changes     Row Name 01/21/22 1300 02/07/22 1200 02/21/22 1100 02/21/22 1400       Response to Exercise   Blood Pressure (Admit) 124/68 118/70 -- 116/68    Blood Pressure (Exercise) 140/60 128/70 -- 136/78    Blood Pressure (Exit) 126/60 110/60 -- 116/60  Heart Rate (Admit) 60 bpm 63 bpm -- 70 bpm    Heart Rate (Exercise) 122 bpm 107 bpm -- 92 bpm    Heart Rate (Exit) 67 bpm 71 bpm -- 77 bpm    Rating of Perceived Exertion (Exercise) 13 11 -- 12    Duration Continue with 30 min of aerobic exercise without signs/symptoms of physical distress. Continue with 30 min of aerobic exercise without signs/symptoms of physical distress. -- Continue with 30 min of aerobic exercise without signs/symptoms of physical distress.    Intensity THRR unchanged THRR unchanged -- THRR unchanged      Progression   Progression Continue to progress workloads to maintain intensity without signs/symptoms of physical distress. Continue to progress workloads to maintain intensity without signs/symptoms of physical distress. -- Continue to progress workloads to maintain intensity without signs/symptoms of physical distress.      Resistance Training   Training Prescription Yes Yes -- Yes    Weight 3 3 -- 3    Reps 10-15 10-15 -- 10-15    Time 10 Minutes 10 Minutes -- 10 Minutes      Treadmill   MPH 2 2.2 -- 2.3    Grade 0 0 -- 0    Minutes 17 17 -- 17    METs 2.53 2.69 -- 2.76      NuStep   Level 2 2 -- 2    SPM 129 121 -- 127    Minutes 22 22 -- 22    METs 2.73 2.42 -- 2.64      Home Exercise Plan   Plans to continue exercise at -- -- Home (comment) --    Frequency -- -- Add 2 additional days to program exercise sessions. --    Initial Home Exercises Provided -- -- 02/21/22 --             Exercise Comments:   Exercise Comments     Row Name 02/21/22 1117           Exercise Comments home exercise reviewed                Exercise Goals and Review:   Exercise Goals     Row Name 01/07/22 1354 01/25/22 0821 02/21/22 1447         Exercise Goals   Increase Physical Activity Yes Yes Yes     Intervention Provide advice, education, support and counseling about physical activity/exercise needs.;Develop an individualized exercise prescription  for aerobic and resistive training based on initial evaluation findings, risk stratification, comorbidities and participant's personal goals. Provide advice, education, support and counseling about physical activity/exercise needs.;Develop an individualized exercise prescription for aerobic and resistive training based on initial evaluation findings, risk stratification, comorbidities and participant's personal goals. Provide advice, education, support and counseling about physical activity/exercise needs.;Develop an individualized exercise prescription for aerobic and resistive training based on initial evaluation findings, risk stratification, comorbidities and participant's personal goals.     Expected Outcomes Short Term: Attend rehab on a regular basis to increase amount of physical activity.;Long Term: Add in home exercise to make exercise part of routine and to increase amount of physical activity.;Long Term: Exercising regularly at least 3-5 days a week. Short Term: Attend rehab on a regular basis to increase amount of physical activity.;Long Term: Add in home exercise to make exercise part of routine and to increase amount of physical activity.;Long Term: Exercising regularly at least 3-5 days a week. Short Term: Attend rehab on a regular basis to increase amount  of physical activity.;Long Term: Add in home exercise to make exercise part of routine and to increase amount of physical activity.;Long Term: Exercising regularly at least 3-5 days a week.     Increase Strength and Stamina Yes Yes Yes     Intervention Provide advice, education, support and counseling about physical activity/exercise needs.;Develop an individualized exercise prescription for aerobic and resistive training based on initial evaluation findings, risk stratification, comorbidities and participant's personal goals. Provide advice, education, support and counseling about physical activity/exercise needs.;Develop an individualized  exercise prescription for aerobic and resistive training based on initial evaluation findings, risk stratification, comorbidities and participant's personal goals. Provide advice, education, support and counseling about physical activity/exercise needs.;Develop an individualized exercise prescription for aerobic and resistive training based on initial evaluation findings, risk stratification, comorbidities and participant's personal goals.     Expected Outcomes Short Term: Increase workloads from initial exercise prescription for resistance, speed, and METs.;Short Term: Perform resistance training exercises routinely during rehab and add in resistance training at home;Long Term: Improve cardiorespiratory fitness, muscular endurance and strength as measured by increased METs and functional capacity (6MWT) Short Term: Increase workloads from initial exercise prescription for resistance, speed, and METs.;Short Term: Perform resistance training exercises routinely during rehab and add in resistance training at home;Long Term: Improve cardiorespiratory fitness, muscular endurance and strength as measured by increased METs and functional capacity (6MWT) Short Term: Increase workloads from initial exercise prescription for resistance, speed, and METs.;Short Term: Perform resistance training exercises routinely during rehab and add in resistance training at home;Long Term: Improve cardiorespiratory fitness, muscular endurance and strength as measured by increased METs and functional capacity (6MWT)     Able to understand and use rate of perceived exertion (RPE) scale Yes Yes Yes     Intervention Provide education and explanation on how to use RPE scale Provide education and explanation on how to use RPE scale Provide education and explanation on how to use RPE scale     Expected Outcomes Short Term: Able to use RPE daily in rehab to express subjective intensity level;Long Term:  Able to use RPE to guide intensity level  when exercising independently Short Term: Able to use RPE daily in rehab to express subjective intensity level;Long Term:  Able to use RPE to guide intensity level when exercising independently Short Term: Able to use RPE daily in rehab to express subjective intensity level;Long Term:  Able to use RPE to guide intensity level when exercising independently     Knowledge and understanding of Target Heart Rate Range (THRR) Yes Yes Yes     Intervention Provide education and explanation of THRR including how the numbers were predicted and where they are located for reference Provide education and explanation of THRR including how the numbers were predicted and where they are located for reference Provide education and explanation of THRR including how the numbers were predicted and where they are located for reference     Expected Outcomes Short Term: Able to state/look up THRR;Short Term: Able to use daily as guideline for intensity in rehab;Long Term: Able to use THRR to govern intensity when exercising independently Short Term: Able to state/look up THRR;Short Term: Able to use daily as guideline for intensity in rehab;Long Term: Able to use THRR to govern intensity when exercising independently Short Term: Able to state/look up THRR;Short Term: Able to use daily as guideline for intensity in rehab;Long Term: Able to use THRR to govern intensity when exercising independently     Able to check  pulse independently Yes Yes Yes     Intervention Provide education and demonstration on how to check pulse in carotid and radial arteries.;Review the importance of being able to check your own pulse for safety during independent exercise Provide education and demonstration on how to check pulse in carotid and radial arteries.;Review the importance of being able to check your own pulse for safety during independent exercise Provide education and demonstration on how to check pulse in carotid and radial arteries.;Review the  importance of being able to check your own pulse for safety during independent exercise     Expected Outcomes Short Term: Able to explain why pulse checking is important during independent exercise;Long Term: Able to check pulse independently and accurately Short Term: Able to explain why pulse checking is important during independent exercise;Long Term: Able to check pulse independently and accurately Short Term: Able to explain why pulse checking is important during independent exercise;Long Term: Able to check pulse independently and accurately     Understanding of Exercise Prescription Yes Yes Yes     Intervention Provide education, explanation, and written materials on patient's individual exercise prescription Provide education, explanation, and written materials on patient's individual exercise prescription Provide education, explanation, and written materials on patient's individual exercise prescription     Expected Outcomes Long Term: Able to explain home exercise prescription to exercise independently;Short Term: Able to explain program exercise prescription Long Term: Able to explain home exercise prescription to exercise independently;Short Term: Able to explain program exercise prescription Long Term: Able to explain home exercise prescription to exercise independently;Short Term: Able to explain program exercise prescription              Exercise Goals Re-Evaluation :  Exercise Goals Re-Evaluation     Row Name 01/25/22 2376 02/21/22 1447           Exercise Goal Re-Evaluation   Exercise Goals Review Increase Physical Activity;Increase Strength and Stamina;Able to understand and use rate of perceived exertion (RPE) scale;Knowledge and understanding of Target Heart Rate Range (THRR);Able to check pulse independently;Understanding of Exercise Prescription Increase Physical Activity;Increase Strength and Stamina;Able to understand and use rate of perceived exertion (RPE)  scale;Knowledge and understanding of Target Heart Rate Range (THRR);Able to check pulse independently;Understanding of Exercise Prescription      Comments Pt has completed 5 sessions of cardiac rehab. She is very motivated during class and is pushing herself. She has already increased her workloads on the treadmill and stepper. She is currently exercising at 2.72 METs on the stepper. Will continue to monitor and progress as able. Pt has completed 14 sessions of cardiac rehab. She continues to push herself and increasing her workload. She is currently not exercising at home but is planning to go [place] on her off days from rehab. She is currently exercising at 2.76 METs on the treadmill. will cotninue to montior and progress as able,      Expected Outcomes Through exercise at rehab and home, the patient will meet their stated goals. Through exercise at rehab and home, the patient will meet their stated goals.                Discharge Exercise Prescription (Final Exercise Prescription Changes):  Exercise Prescription Changes - 02/21/22 1400       Response to Exercise   Blood Pressure (Admit) 116/68    Blood Pressure (Exercise) 136/78    Blood Pressure (Exit) 116/60    Heart Rate (Admit) 70 bpm    Heart Rate (Exercise)  92 bpm    Heart Rate (Exit) 77 bpm    Rating of Perceived Exertion (Exercise) 12    Duration Continue with 30 min of aerobic exercise without signs/symptoms of physical distress.    Intensity THRR unchanged      Progression   Progression Continue to progress workloads to maintain intensity without signs/symptoms of physical distress.      Resistance Training   Training Prescription Yes    Weight 3    Reps 10-15    Time 10 Minutes      Treadmill   MPH 2.3    Grade 0    Minutes 17    METs 2.76      NuStep   Level 2    SPM 127    Minutes 22    METs 2.64             Nutrition:  Target Goals: Understanding of nutrition guidelines, daily intake of sodium  '1500mg'$ , cholesterol '200mg'$ , calories 30% from fat and 7% or less from saturated fats, daily to have 5 or more servings of fruits and vegetables.  Biometrics:  Pre Biometrics - 01/07/22 1355       Pre Biometrics   Height '5\' 4"'$  (1.626 m)    Weight 69.8 kg    Waist Circumference 36.5 inches    Hip Circumference 39 inches    Waist to Hip Ratio 0.94 %    BMI (Calculated) 26.4    Triceps Skinfold 26 mm    % Body Fat 39.3 %    Grip Strength 22.1 kg    Flexibility 7 in    Single Leg Stand 5.14 seconds              Nutrition Therapy Plan and Nutrition Goals:  Nutrition Therapy & Goals - 01/07/22 1323       Personal Nutrition Goals   Comments Patient scored 22 on her diet assessment. Handout explained and provided regarding heathier choices. We offer 2 educational sessions on heart healthy nutrition with handouts and assistance with RD referral if patient is interested.      Intervention Plan   Intervention Nutrition handout(s) given to patient.    Expected Outcomes Short Term Goal: Understand basic principles of dietary content, such as calories, fat, sodium, cholesterol and nutrients.             Nutrition Assessments:  Nutrition Assessments - 01/07/22 1323       MEDFICTS Scores   Pre Score 22            MEDIFICTS Score Key: ?70 Need to make dietary changes  40-70 Heart Healthy Diet ? 40 Therapeutic Level Cholesterol Diet   Picture Your Plate Scores: <67 Unhealthy dietary pattern with much room for improvement. 41-50 Dietary pattern unlikely to meet recommendations for good health and room for improvement. 51-60 More healthful dietary pattern, with some room for improvement.  >60 Healthy dietary pattern, although there may be some specific behaviors that could be improved.    Nutrition Goals Re-Evaluation:   Nutrition Goals Discharge (Final Nutrition Goals Re-Evaluation):   Psychosocial: Target Goals: Acknowledge presence or absence of significant  depression and/or stress, maximize coping skills, provide positive support system. Participant is able to verbalize types and ability to use techniques and skills needed for reducing stress and depression.  Initial Review & Psychosocial Screening:  Initial Psych Review & Screening - 01/07/22 1335       Initial Review   Current issues with None Identified  Family Dynamics   Good Support System? Yes      Barriers   Psychosocial barriers to participate in program There are no identifiable barriers or psychosocial needs.      Screening Interventions   Interventions Encouraged to exercise;Provide feedback about the scores to participant    Expected Outcomes Short Term goal: Utilizing psychosocial counselor, staff and physician to assist with identification of specific Stressors or current issues interfering with healing process. Setting desired goal for each stressor or current issue identified.             Quality of Life Scores:  Quality of Life - 01/07/22 1352       Quality of Life   Select Quality of Life      Quality of Life Scores   Health/Function Pre 22.71 %    Socioeconomic Pre 30 %    Psych/Spiritual Pre 26.57 %    Family Pre 26.3 %    GLOBAL Pre 25.48 %            Scores of 19 and below usually indicate a poorer quality of life in these areas.  A difference of  2-3 points is a clinically meaningful difference.  A difference of 2-3 points in the total score of the Quality of Life Index has been associated with significant improvement in overall quality of life, self-image, physical symptoms, and general health in studies assessing change in quality of life.  PHQ-9: Review Flowsheet  More data exists      01/07/2022 12/17/2021 06/10/2021 05/27/2021 02/03/2021  Depression screen PHQ 2/9  Decreased Interest 0 0 0 0 0  Down, Depressed, Hopeless 0 0 0 0 0  PHQ - 2 Score 0 0 0 0 0  Altered sleeping 1 - - - -  Tired, decreased energy 1 - - - -  Change in  appetite 0 - - - -  Feeling bad or failure about yourself  0 - - - -  Trouble concentrating 0 - - - -  Moving slowly or fidgety/restless 0 - - - -  Suicidal thoughts 0 - - - -  PHQ-9 Score 2 - - - -  Difficult doing work/chores Somewhat difficult - - - -   Interpretation of Total Score  Total Score Depression Severity:  1-4 = Minimal depression, 5-9 = Mild depression, 10-14 = Moderate depression, 15-19 = Moderately severe depression, 20-27 = Severe depression   Psychosocial Evaluation and Intervention:  Psychosocial Evaluation - 01/07/22 1335       Psychosocial Evaluation & Interventions   Interventions Stress management education;Relaxation education;Encouraged to exercise with the program and follow exercise prescription    Comments Patient has no psychosocial barriers or issues identified at her orientation visit. Her PHQ-9 score was 2 due to lack of energy and trouble falling asleep at timess. She is a very pleasant 81 year old female who lives with her husband of many years. They have 3 children, 5 grandchildren and 1 great grandchild that all live near. She says her children and grandchildren are very supportative of she and her husband and she feels very blessed to have them. Her husband is currently doing our PR program. She is active around the house and is currently walking 3 days/week but wants to do more exercising. She is ready to start the program hoping to gain strength and stamina and have less SOB.    Expected Outcomes Patient will continue to have no psychosocial barriers or issues identified.    Continue  Psychosocial Services  No Follow up required             Psychosocial Re-Evaluation:  Psychosocial Re-Evaluation     [place] Name 01/19/22 1425 02/16/22 0942           Psychosocial Re-Evaluation   Current issues with None Identified None Identified      Comments Patient is new to the program comleting 4 sessions. She continues to have no psychosocial barriers or  issues identified. She seems to enjoy the sessions and demonstrates an interest to improve her health. Patient has comleted 10 sessions. She continues to have no psychosocial barriers or issues identified. She continues to enjoy the sessions and demonstrates an interest to improve her health. She is very interactive with starff and other patient in her class. We will continue to monitor her progress.      Expected Outcomes Patient will continue to have no psychosocial barriers or issues identified. Patient will continue to have no psychosocial barriers or issues identified.      Interventions Stress management education;Relaxation education;Encouraged to attend Cardiac Rehabilitation for the exercise Stress management education;Relaxation education;Encouraged to attend Cardiac Rehabilitation for the exercise      Continue Psychosocial Services  No Follow up required No Follow up required               Psychosocial Discharge (Final Psychosocial Re-Evaluation):  Psychosocial Re-Evaluation - 02/16/22 0942       Psychosocial Re-Evaluation   Current issues with None Identified    Comments Patient has comleted 10 sessions. She continues to have no psychosocial barriers or issues identified. She continues to enjoy the sessions and demonstrates an interest to improve her health. She is very interactive with starff and other patient in her class. We will continue to monitor her progress.    Expected Outcomes Patient will continue to have no psychosocial barriers or issues identified.    Interventions Stress management education;Relaxation education;Encouraged to attend Cardiac Rehabilitation for the exercise    Continue Psychosocial Services  No Follow up required             Vocational Rehabilitation: Provide vocational rehab assistance to qualifying candidates.   Vocational Rehab Evaluation & Intervention:  Vocational Rehab - 01/07/22 1325       Vocational Rehab Re-[facility]   Comments  Patient is retired and does not need vocational rehab.             Education: Education Goals: Education classes will be provided on a weekly basis, covering required topics. Participant will state understanding/return demonstration of topics presented.  Learning Barriers/Preferences:  Learning Barriers/Preferences - 01/07/22 1325       Learning Barriers/Preferences   Learning Barriers None    Learning Preferences Written Material;Skilled Demonstration;Pictoral             Education Topics: Hypertension, Hypertension Reduction -Define heart disease and high blood pressure. Discus how high blood pressure affects the body and ways to reduce high blood pressure.   Exercise and Your Heart -Discuss why it is important to exercise, the FITT principles of exercise, normal and abnormal responses to exercise, and how to exercise safely.   Angina -Discuss definition of angina, causes of angina, treatment of angina, and how to decrease risk of having angina.   Cardiac Medications -Review what the following cardiac medications are used for, how they affect the body, and side effects that may occur when taking the medications.  Medications include Aspirin, Beta blockers, calcium channel blockers, ACE Inhibitors,  angiotensin receptor blockers, diuretics, digoxin, and antihyperlipidemics. Flowsheet Row CARDIAC REHAB PHASE II EXERCISE from 02/16/2022 in [place]  Date 01/12/22  Educator hj  Instruction Review Code 1- Verbalizes Understanding       Congestive Heart Failure -Discuss the definition of CHF, how to live with CHF, the signs and symptoms of CHF, and how keep track of weight and sodium intake. Flowsheet Row CARDIAC REHAB PHASE II EXERCISE from 02/16/2022 in [place]  Date 01/19/22  Educator HB  Instruction Review Code 1- Verbalizes Understanding       Heart Disease and Intimacy -Discus the effect sexual activity has  on the heart, how changes occur during intimacy as we age, and safety during sexual activity. Flowsheet Row CARDIAC REHAB PHASE II EXERCISE from 02/16/2022 in [place]  Date 01/26/22  Educator hb  Instruction Review Code 1- Verbalizes Understanding       Smoking Cessation / COPD -Discuss different methods to quit smoking, the health benefits of quitting smoking, and the definition of COPD. Flowsheet Row CARDIAC REHAB PHASE II EXERCISE from 02/16/2022 in [place]  Date 02/02/22  Educator HB  Instruction Review Code 1- Verbalizes Understanding       Nutrition I: Fats -Discuss the types of cholesterol, what cholesterol does to the heart, and how cholesterol levels can be controlled. Flowsheet Row CARDIAC REHAB PHASE II EXERCISE from 02/16/2022 in [place]  Date 02/14/22  Educator HB  Instruction Review Code 1- Verbalizes Understanding       Nutrition II: Labels -Discuss the different components of food labels and how to read food label [place] from 02/16/2022 in [place]  Date 02/16/22  Educator HB  Instruction Review Code 1- Verbalizes Understanding       Heart Parts/Heart Disease and PAD -Discuss the anatomy of the heart, the pathway of blood circulation through the heart, and these are affected by heart disease.   Stress I: Signs and Symptoms -Discuss the causes of stress, how stress may lead to anxiety and depression, and ways to limit stress.   Stress II: Relaxation -Discuss different types of relaxation techniques to limit stress.   Warning Signs of Stroke / TIA -Discuss definition of a stroke, what the signs and symptoms are of a stroke, and how to identify when someone is having stroke.   Knowledge Questionnaire Score:  Knowledge Questionnaire Score - 01/07/22 1323       Knowledge Questionnaire Score   Pre Score  22/24             Core Components/Risk Factors/Patient Goals at Admission:  Personal Goals and Risk Factors at Admission - 01/07/22 1325       Core Components/Risk Factors/Patient Goals on Admission    Weight Management Weight Maintenance    Improve shortness of breath with ADL's Yes    Intervention Provide education, individualized exercise plan and daily activity instruction to help decrease symptoms of SOB with activities of daily living.    Expected Outcomes Short Term: Improve cardiorespiratory fitness to achieve a reduction of symptoms when performing ADLs;Long Term: Be able to perform more ADLs without symptoms or delay the onset of symptoms    Hypertension Yes    Intervention Provide education on lifestyle modifcations including regular physical activity/exercise, weight management, moderate sodium restriction and increased consumption of fresh fruit, vegetables, and low fat dairy, alcohol moderation, and smoking cessation.;Monitor prescription use compliance.  Expected Outcomes Short Term: Continued assessment and intervention until BP is < 140/59m HG in hypertensive participants. < 130/848mHG in hypertensive participants with diabetes, heart failure or chronic kidney disease.;Long Term: Maintenance of blood pressure at goal levels.    Personal Goal Other Yes    Personal Goal Patient wants to improve her strenght and stamina and have less SOB.    Intervention Patient will attend CR 3 days/week with exercise and education.    Expected Outcomes Patient will complete the program meeting both personal and program goals.             Core Components/Risk Factors/Patient Goals Review:   Goals and Risk Factor Review     Row Name 01/19/22 1427 02/16/22 0943           Core Components/Risk Factors/Patient Goals Review   Personal Goals Review Weight Management/Obesity;Hypertension;Other Weight Management/Obesity;Hypertension;Other      Review Patient was referred to CR with  [facility]. She has multiple risk factors for CAD and is participating in the program for risk modification. She has completed 4 sessions. Her current weight is 154.0 maintained from her initial visit. She say JiKathyrn DrownPA with cardiology 10/30. Patient is having Bigeminy during her walk test and when walking on the treadmill. I sent a message to JiKathyrn DrownPA the morning of patient's visit and sent telemetry strips with patient. Office note has not been completed. Patient says she increased her Metoprolol from 25 mg BID to 50 mg BID. Patient's personal goals for the program are to improve her strength and stamina and have less SOB. We will continue to monitor her progress as she works towards meeting these goals. Patient has completed 10 sessions. Her current weight is 154.2 maintained since last 30 day review. She is doing well in the program with consistent attendance and progressions. She continues to have PVC's but no longer in bigeminy on the increased dosage of Metoprolol. When she saw cardiology 10/30 she was complaining of Diplopia and was referred to Neurology. No appointemt has been made. Patient's personal goals for the program continue to be to improve her strength and stamina and have less SOB. We will continue to monitor her progress as she works towards meeting these goals.      Expected Outcomes Patient will complete the program meeting both personal and program goals. Patient will complete the program meeting both personal and program goals.               Core Components/Risk Factors/Patient Goals at Discharge (Final Review):   Goals and Risk Factor Review - 02/16/22 0943       Core Components/Risk Factors/Patient Goals Review   Personal Goals Review Weight Management/Obesity;Hypertension;Other    Review Patient has completed 10 sessions. Her current weight is 154.2 maintained since last 30 day review. She is doing well in the program with consistent attendance and progressions.  She continues to have PVC's but no longer in bigeminy on the increased dosage of Metoprolol. When she saw cardiology 10/30 she was complaining of Diplopia and was referred to Neurology. No appointemt has been made. Patient's personal goals for the program continue to be to improve her strength and stamina and have less SOB. We will continue to monitor her progress as she works towards meeting these goals.    Expected Outcomes Patient will complete the program meeting both personal and program goals.             ITP Comments:   Comments: ITP  REVIEW Pt is making expected progress toward Cardiac Rehab goals after completing 14 sessions. Recommend continued exercise, life style modification, education, and increased stamina and strength.

## 2022-02-24 ENCOUNTER — Encounter: Payer: Self-pay | Admitting: Family Medicine

## 2022-02-24 ENCOUNTER — Ambulatory Visit (INDEPENDENT_AMBULATORY_CARE_PROVIDER_SITE_OTHER): Payer: PPO | Admitting: Family Medicine

## 2022-02-24 DIAGNOSIS — U071 COVID-19: Secondary | ICD-10-CM | POA: Diagnosis not present

## 2022-02-24 MED ORDER — MOLNUPIRAVIR EUA 200MG CAPSULE: 4.0000 | ORAL_CAPSULE | Freq: Two times a day (BID) | ORAL | 0 refills | Status: AC

## 2022-02-24 NOTE — Progress Notes (Signed)
Virtual Visit via telephone Note  I connected with Autumn Moyer on 02/24/22 at 1344 by telephone and verified that I am speaking with the correct person using two identifiers. Autumn Moyer is currently located at home and patient are currently with her during visit. The provider, Fransisca Kaufmann Alicha Raspberry, MD is located in their office at time of visit.  Call ended at 1350  I discussed the limitations, risks, security and privacy concerns of performing an evaluation and management service by telephone and the availability of in person appointments. I also discussed with the patient that there may be a patient responsible charge related to this service. The patient expressed understanding and agreed to proceed.   History and Present Illness: Patient is calling in for covid. She is stopped up and achy and sinus pressure that started 3 days ago.  She is vaccinated for the first four covid vaccines.  She thinks her husband recently had it.  She had covid last year. She denies SOB or wheezing. She uses tylenol to help.    1. COVID-19 virus infection     Outpatient Encounter Medications as of 02/24/2022  Medication Sig   molnupiravir EUA (LAGEVRIO) 200 mg CAPS capsule Take 4 capsules (800 mg total) by mouth 2 (two) times daily for 5 days.   acetaminophen (TYLENOL) 500 MG tablet Take 1,000 mg by mouth in the morning.   apixaban (ELIQUIS) 5 MG TABS tablet Take 1 tablet (5 mg total) by mouth 2 (two) times daily.   azithromycin (ZITHROMAX) 500 MG tablet Take 1 tablet by mouth 1 hour before dental cleanings and procedures   benazepril-hydrochlorthiazide (LOTENSIN HCT) 20-25 MG tablet Take 1 tablet by mouth daily.   carboxymethylcellulose (REFRESH TEARS) 0.5 % SOLN Place 2 drops into both eyes See admin instructions. Instill 2 drops into both eyes scheduled every morning & as needed for dry/irritated eyes.   digoxin (LANOXIN) 0.125 MG tablet Take 1 tablet (0.125 mg total) by mouth daily.   furosemide  (LASIX) 20 MG tablet Take 20 mg by mouth daily as needed for edema or fluid.   levocetirizine (XYZAL) 5 MG tablet Take 1 tablet (5 mg total) by mouth every evening.   levothyroxine (SYNTHROID) 88 MCG tablet Take 1 tablet (88 mcg total) by mouth daily.   metoprolol tartrate (LOPRESSOR) 50 MG tablet Take 1 tablet (50 mg total) by mouth 2 (two) times daily.   mometasone (ELOCON) 0.1 % cream Apply 1 application topically daily.   Multiple Vitamin (MULTIVITAMIN WITH MINERALS) TABS tablet Take 1 tablet by mouth every evening.   pantoprazole (PROTONIX) 40 MG tablet Take 1 tablet (40 mg total) by mouth daily.   tacrolimus (PROTOPIC) 0.1 % ointment Apply topically in the morning and at bedtime.   No facility-administered encounter medications on file as of 02/24/2022.    Review of Systems  Constitutional:  Positive for fever. Negative for chills.  HENT:  Positive for congestion, postnasal drip, rhinorrhea and sinus pressure. Negative for ear discharge, ear pain, sneezing and sore throat.   Eyes:  Negative for pain, redness and visual disturbance.  Respiratory:  Positive for cough. Negative for chest tightness and shortness of breath.   Cardiovascular:  Negative for chest pain and leg swelling.  Genitourinary:  Negative for difficulty urinating and dysuria.  Musculoskeletal:  Positive for myalgias. Negative for back pain and gait problem.  Skin:  Negative for rash.  Neurological:  Negative for light-headedness and headaches.  Psychiatric/Behavioral:  Negative for agitation and behavioral  problems.   All other systems reviewed and are negative.   Observations/Objective: Patient sounds comfortable and in no acute distress  Assessment and Plan: Problem List Items Addressed This Visit   None Visit Diagnoses     COVID-19 virus infection    -  Primary   Relevant Medications   molnupiravir EUA (LAGEVRIO) 200 mg CAPS capsule      She can use mucinex and flonase and sent molnupiravir Call if  worsens or does not improve Follow up plan: Return if symptoms worsen or fail to improve.     I discussed the assessment and treatment plan with the patient. The patient was provided an opportunity to ask questions and all were answered. The patient agreed with the plan and demonstrated an understanding of the instructions.   The patient was advised to call back or seek an in-person evaluation if the symptoms worsen or if the condition fails to improve as anticipated.  The above assessment and management plan was discussed with the patient. The patient verbalized understanding of and has agreed to the management plan. Patient is aware to call the clinic if symptoms persist or worsen. Patient is aware when to return to the clinic for a follow-up visit. Patient educated on when it is appropriate to go to the emergency department.    I provided 6 minutes of non-face-to-face time during this encounter.    Worthy Rancher, MD

## 2022-02-25 ENCOUNTER — Encounter (HOSPITAL_COMMUNITY): Payer: PPO

## 2022-02-28 ENCOUNTER — Encounter (HOSPITAL_COMMUNITY): Payer: PPO

## 2022-03-02 ENCOUNTER — Encounter (HOSPITAL_COMMUNITY): Payer: PPO

## 2022-03-04 ENCOUNTER — Encounter (HOSPITAL_COMMUNITY): Payer: PPO

## 2022-03-07 ENCOUNTER — Encounter (HOSPITAL_COMMUNITY)
Admission: RE | Admit: 2022-03-07 | Discharge: 2022-03-07 | Disposition: A | Discharge status: Home / Self Care | Payer: PPO | Source: Ambulatory Visit | Attending: Internal Medicine | Admitting: Internal Medicine

## 2022-03-07 VITALS — Wt 150.4 lb

## 2022-03-07 DIAGNOSIS — Z952 Presence of prosthetic heart valve: Secondary | ICD-10-CM | POA: Diagnosis not present

## 2022-03-07 NOTE — Progress Notes (Signed)
Daily Session Note  Patient Details  Name: Autumn Moyer MRN: [id number] Date of Birth: Sep 04, 1940 Referring Provider:   Flowsheet Row CARDIAC REHAB PHASE II ORIENTATION from 01/07/2022 in [place]  Referring Provider Dr. Ali Lowe       Encounter Date: 03/07/2022  Check In:  Session Check In - 03/07/22 1042       Check-In   Supervising physician immediately available to respond to emergencies [facility] MD immediately available    Physician(s) Dr Dellia Cloud    Location [facility]    Staff Present Leana Roe, BS, Exercise Physiologist;Chivas Notz Hassell Done, RN, BSN    Virtual Visit No    Medication changes reported     No    Fall or balance concerns reported    Yes    Comments Patient reports frequently losing her balance.    Tobacco Cessation No Change    Warm-up and Cool-down Performed as group-led instruction    Resistance Training Performed Yes    VAD Patient? No    PAD/SET Patient? No      Pain Assessment   Currently in Pain? No/denies    Pain Score 0-No pain    Multiple Pain Sites No             Capillary Blood Glucose: No results found for this or any previous visit (from the past 24 hour(s)).    Social History   Tobacco Use  Smoking Status Never   Passive exposure: Past  Smokeless Tobacco Never    Goals Met:  Independence with exercise equipment Exercise tolerated well No report of concerns or symptoms today Strength training completed today  Goals Unmet:  Not Applicable  Comments: Checkout at 1200.   Dr. Carlyle Dolly is Medical Director for [facility] Cardiac Rehab

## 2022-03-09 ENCOUNTER — Encounter (HOSPITAL_COMMUNITY): Payer: Self-pay | Admitting: Dentistry

## 2022-03-09 ENCOUNTER — Ambulatory Visit (INDEPENDENT_AMBULATORY_CARE_PROVIDER_SITE_OTHER): Payer: PPO | Admitting: Dentistry

## 2022-03-09 ENCOUNTER — Encounter (HOSPITAL_COMMUNITY): Payer: PPO

## 2022-03-09 VITALS — BP 126/68 | HR 69 | Temp 98.0°F

## 2022-03-09 DIAGNOSIS — K029 Dental caries, unspecified: Secondary | ICD-10-CM | POA: Diagnosis not present

## 2022-03-09 DIAGNOSIS — Z952 Presence of prosthetic heart valve: Secondary | ICD-10-CM

## 2022-03-09 NOTE — Progress Notes (Signed)
[facility]      TODAY'S VISIT:    RESTORATIVE   DIAGNOSIS: Tooth #14 caries PROCEDURES: Restorative on #14 PLAN: Follow-up as needed in the hospital dental clinic.  Patient has an appointment scheduled with a new dental provider in February to establish care with. Call if any questions or concerns arise.    Service Date:   03/09/2022  Patient Name:   Autumn Moyer Date of Birth:   29-Dec-1940 Medical Record Number: [id number]   HISTORY OF PRESENT ILLNESS: Autumn Moyer is a 81 y.o. female who presents today for restorative treatment on tooth number 14 per treatment plan. Medical and dental history reviewed with the patient.  She is now status-post TAVR which was completed on 12/14/21. She states that she has been doing really well since her heart surgery.   CHIEF COMPLAINT:  Here for a routine dental appointment; patient with no complaints.   Patient Active Problem List   Diagnosis Date Noted   S/P TAVR (transcatheter aortic valve replacement) 12/14/2021   Long term current use of anticoagulant therapy 09/28/2021   Torus palatinus 09/28/2021   Hiatal hernia 07/17/2020   Atrial fibrillation ([place]) 02/10/2020   Severe aortic stenosis 10/09/2018   Prediabetes 06/29/2016   Overweight (BMI 25.0-29.9) 11/27/2015   Allergic rhinitis 05/22/2015   Iron deficiency anemia 05/22/2015   Essential hypertension, benign 11/15/2013   Esophageal reflux 11/15/2013   Hypothyroid 06/11/2010   Past Medical History:  Diagnosis Date   Atrial fibrillation, persistent (HCC)    Complication of anesthesia    Hiatal hernia    Hypertension    PONV (postoperative nausea and vomiting)    S/P TAVR (transcatheter aortic valve replacement) 12/14/2021   s/p TAVR with a 56m Edwards S3UR via the TF approach by Dr. TAli Lowe& Dr. WLavonna Monarch  Severe aortic stenosis    Squamous cell carcinoma of skin 01/16/2018   in situ-right knee post (txpbx)    Thyroid disease    Past Surgical History:  Procedure Laterality Date   ABDOMINAL AORTOGRAM N/A 09/22/2021   Procedure: ABDOMINAL AORTOGRAM;  Surgeon: TEarly Osmond MD;  [facility];  Service: Cardiovascular;  Laterality: N/A;   CARDIAC CATHETERIZATION     EYE SURGERY     INTRAOPERATIVE TRANSTHORACIC ECHOCARDIOGRAM N/A 12/14/2021   Procedure: INTRAOPERATIVE TRANSTHORACIC ECHOCARDIOGRAM;  Surgeon: TEarly Osmond MD;  [facility]  Service: Open Heart Surgery;  Laterality: N/A;   MULTIPLE EXTRACTIONS WITH ALVEOLOPLASTY N/A 12/02/2021   Procedure: MULTIPLE EXTRACTION WITH ALVEOLOPLASTY;  Surgeon: OCharlaine Dalton DMD;  [facility]  Service: Dentistry;  Laterality: N/A;   RIGHT HEART CATH AND CORONARY/GRAFT ANGIOGRAPHY N/A 09/22/2021   Procedure: RIGHT HEART CATH AND CORONARY/GRAFT ANGIOGRAPHY;  Surgeon: TEarly Osmond MD;  [facility];  Service: Cardiovascular;  Laterality: N/A;   TOE SURGERY     TRANSCATHETER AORTIC VALVE REPLACEMENT, TRANSFEMORAL N/A 12/14/2021   Procedure: Transcatheter Aortic Valve Replacement, Transfemoral using Edwards 23 MM SAPIEN 3 Ultra;  Surgeon: TEarly Osmond MD;  [facility]  Service: Open Heart Surgery;  Laterality: N/A;  Transfemoral approach   TUBAL LIGATION     Allergies  Allergen Reactions   Noroxin [Norfloxacin] Nausea And Vomiting   Augmentin [Amoxicillin-Pot Clavulanate] Other (See Comments)    Gums swelling, lips numb   Current Outpatient Medications  Medication Sig Dispense Refill   acetaminophen (TYLENOL) 500 MG tablet Take 1,000 mg by mouth in  the morning.     apixaban (ELIQUIS) 5 MG TABS tablet Take 1 tablet (5 mg total) by mouth 2 (two) times daily. 180 tablet 1   azithromycin (ZITHROMAX) 500 MG tablet Take 1 tablet by mouth 1 hour before dental cleanings and procedures 12 tablet 6   benazepril-hydrochlorthiazide (LOTENSIN HCT) 20-25 MG tablet Take 1 tablet by mouth daily. 90 tablet 1    carboxymethylcellulose (REFRESH TEARS) 0.5 % SOLN Place 2 drops into both eyes See admin instructions. Instill 2 drops into both eyes scheduled every morning & as needed for dry/irritated eyes.     digoxin (LANOXIN) 0.125 MG tablet Take 1 tablet (0.125 mg total) by mouth daily. 90 tablet 3   furosemide (LASIX) 20 MG tablet Take 20 mg by mouth daily as needed for edema or fluid.     levocetirizine (XYZAL) 5 MG tablet Take 1 tablet (5 mg total) by mouth every evening. 90 tablet 1   levothyroxine (SYNTHROID) 88 MCG tablet Take 1 tablet (88 mcg total) by mouth daily. 90 tablet 3   metoprolol tartrate (LOPRESSOR) 50 MG tablet Take 1 tablet (50 mg total) by mouth 2 (two) times daily. 180 tablet 3   mometasone (ELOCON) 0.1 % cream Apply 1 application topically daily. 45 g 2   Multiple Vitamin (MULTIVITAMIN WITH MINERALS) TABS tablet Take 1 tablet by mouth every evening.     pantoprazole (PROTONIX) 40 MG tablet Take 1 tablet (40 mg total) by mouth daily. 90 tablet 1   tacrolimus (PROTOPIC) 0.1 % ointment Apply topically in the morning and at bedtime. 30 g 4   No current facility-administered medications for this visit.    LABS: Lab Results  Component Value Date   WBC 10.5 12/15/2021   HGB 11.6 (L) 12/15/2021   HCT 34.3 (L) 12/15/2021   MCV 89.8 12/15/2021   PLT 215 12/15/2021      Component Value Date/Time   NA 140 12/15/2021 0206   NA 143 09/10/2021 1644   K 4.1 12/15/2021 0206   CL 107 12/15/2021 0206   CO2 23 12/15/2021 0206   GLUCOSE 154 (H) 12/15/2021 0206   BUN 12 12/15/2021 0206   BUN 18 09/10/2021 1644   CREATININE 0.70 12/15/2021 0206   CALCIUM 8.7 (L) 12/15/2021 0206   GFRNONAA >60 12/15/2021 0206   GFRAA 69 04/13/2020 0918   Lab Results  Component Value Date   INR 1.1 12/10/2021   No results found for: "PTT"  Social History   Socioeconomic History   Marital status: Married    Spouse name: Geneticist, molecular   Number of children: 2   Years of education: Not on file   Highest  education level: Not on file  Occupational History   Not on file  Tobacco Use   Smoking status: Never    Passive exposure: Past   Smokeless tobacco: Never  Vaping Use   Vaping Use: Never used  Substance and Sexual Activity   Alcohol use: No    Alcohol/week: 0.0 standard drinks of alcohol   Drug use: No   Sexual activity: Not Currently    Partners: Male  Other Topics Concern   Not on file  Social History Narrative   Lives with husband.  Married x 56 years in 2022.   2 daughters   5 grand children   1 great grandchild.   Social Determinants of Health   Financial Resource Strain: Low Risk  (02/03/2021)   Overall Financial Resource Strain (CARDIA)    Difficulty of Paying  Living Expenses: Not hard at all  Food Insecurity: No Food Insecurity (12/14/2021)   Hunger Vital Sign    Worried About Running Out of Food in the Last Year: Never true    Ran Out of Food in the Last Year: Never true  Transportation Needs: No Transportation Needs (12/14/2021)   PRAPARE - Hydrologist (Medical): No    Lack of Transportation (Non-Medical): No  Physical Activity: Insufficiently Active (02/03/2021)   Exercise Vital Sign    Days of Exercise per Week: 3 days    Minutes of Exercise per Session: 20 min  Stress: No Stress Concern Present (02/03/2021)   [place]    Feeling of Stress : Not at all  Social Connections: [place] (02/03/2021)   Social Connection and Isolation Panel [NHANES]    Frequency of Communication with Friends and Family: More than three times a week    Frequency of Social Gatherings with Friends and Family: More than three times a week    Attends Religious Services: More than 4 times per year    Active Member of [employer] or Organizations: Yes    Attends Music therapist: More than 4 times per year    Marital Status: Married  Human resources officer Violence: Not At Risk  (12/14/2021)   Humiliation, Afraid, Rape, and Kick questionnaire    Fear of Current or Ex-Partner: No    Emotionally Abused: No    Physically Abused: No    Sexually Abused: No   Family History  Problem Relation Age of Onset   Hypertension Mother    Hyperlipidemia Mother    Heart failure Mother        Age 68   Hyperlipidemia Father    Hypertension Father    Heart disease Father        died of MI after developoing cancer.   Lung cancer Father    Hypertension Sister    Breast cancer Daughter    Breast cancer Paternal Aunt      ANTIBIOTIC PROPHYLAXIS/OTHER PREMEDICATION: Patient reports taking Azithromycin 500 mg (x1 tab PO) 1 hour prior to dental appointment as instructed (patient has an Augmentin/amoxicillin/norfloxacin allergy).   VITAL SIGNS: BP 126/68 (BP Location: Right Arm, Patient Position: Sitting, Cuff Size: Normal)   Pulse 69   Temp 98 F (36.7 C) (Oral)    RADIOGRAPH(S): Periapical taken on 09/27/21.    ASSESSMENT/INDICATION(S): Recurrent decay tooth #14   PROCEDURES: Restorative treatment on tooth #14 *** . ANESTHESIA: Topical:  Benzocaine 20% applied Type of anesthesia used:  *** mg lidocaine, *** mg epinephrine Location given:  *** Aspiration negative. COMPOSITE/FUJI II***RESTORATION(S): Cotton roll isolation. Excavated decay *** . Tooth/teeth number(s) prepared for composite. The extent of caries was *** . Placed Vitrabond *** Gluma *** Dycal *** on pulpal floor. Etched enamel and dentin surfaces  with 32% phosphoric acid for 15 seconds and rinsed thoroughly. Removed excess water with a brief burst of air. Optibond bonding agent placed and air dried until no movement of bonding agent was seen. Repeated and then light cured for 10 seconds. Composite shade *** material placed in increments and light-cured. Removed excess material with carbide finishing bur. Occlusion, margins and contact *** verified and adjusted as needed.  Restoration(s) *** finished  and polished. The patient was advised of possible normal sensitivity to hot and cold for the next few days/weeks.   PLAN:   NEXT VISIT:  Restorative per treatment plan ***  All questions and concerns were invited and addressed.  The patient tolerated today's visit well and departed in stable condition.  -Sandi Mariscal, DMD

## 2022-03-11 ENCOUNTER — Encounter: Payer: Self-pay | Admitting: Neurology

## 2022-03-11 ENCOUNTER — Ambulatory Visit: Payer: PPO | Admitting: Neurology

## 2022-03-11 ENCOUNTER — Encounter (HOSPITAL_COMMUNITY)
Admission: RE | Admit: 2022-03-11 | Discharge: 2022-03-11 | Disposition: A | Discharge status: Home / Self Care | Payer: PPO | Source: Ambulatory Visit | Attending: Internal Medicine | Admitting: Internal Medicine

## 2022-03-11 VITALS — BP 148/82 | HR 85 | Ht 64.0 in | Wt 153.2 lb

## 2022-03-11 DIAGNOSIS — H532 Diplopia: Secondary | ICD-10-CM | POA: Diagnosis not present

## 2022-03-11 DIAGNOSIS — Z952 Presence of prosthetic heart valve: Secondary | ICD-10-CM

## 2022-03-11 DIAGNOSIS — I4811 Longstanding persistent atrial fibrillation: Secondary | ICD-10-CM

## 2022-03-11 NOTE — Progress Notes (Signed)
Daily Session Note  Patient Details  Name: Autumn Moyer MRN: [id number] Date of Birth: 06/30/1940 Referring Provider:   Flowsheet Row CARDIAC REHAB PHASE II ORIENTATION from 01/07/2022 in [facility]  Referring Provider Dr. Thukkani       Encounter Date: 03/11/2022  Check In:  Session Check In - 03/11/22 1045       Check-In   Supervising physician immediately available to respond to emergencies [facility] MD immediately available    Physician(s) Dr Mallipeddi    Location [facility]    Staff Present Heather Bailey, BS, Exercise Physiologist;Daphyne Martin, RN, BSN;Debra Johnson, RN, BSN    Virtual Visit No    Medication changes reported     No    Fall or balance concerns reported    Yes    Comments Patient reports frequently losing her balance.    Warm-up and Cool-down Performed as group-led instruction    Resistance Training Performed Yes    VAD Patient? No    PAD/SET Patient? No      Pain Assessment   Currently in Pain? No/denies    Pain Score 0-No pain    Multiple Pain Sites No             Capillary Blood Glucose: No results found for this or any previous visit (from the past 24 hour(s)).    Social History   Tobacco Use  Smoking Status Never   Passive exposure: Past  Smokeless Tobacco Never    Goals Met:  Independence with exercise equipment Exercise tolerated well No report of concerns or symptoms today Strength training completed today  Goals Unmet:  Not Applicable  Comments: Checkout at 1200.   Dr. Jonathan Branch is Medical Director for [facility] 

## 2022-03-11 NOTE — Patient Instructions (Signed)
These events are not classic for migraine.  I do suspect that there may be a plaque broken off still in the heart.  Will check MRI of brain without contrast and MRA of head and neck Will contact cardiology to see if we can do a transesophageal echocardiogram Further recommendations pending results.  Follow up after testing.

## 2022-03-11 NOTE — Progress Notes (Unsigned)
NEUROLOGY CONSULTATION NOTE  Autumn Moyer MRN: [id number] DOB: 07/23/1940  Referring provider: Kathyrn Drown, NP Primary care provider: Evelina Dun, FNP  Reason for consult:  visual disturbance, double vision  Assessment/Plan:   Visual disturbance presenting as vertical diplopia and brief sparkles of light.  Semiology of vertical diplopia not consistent with migraine aura and the other visual symptoms are too brief for migraine aura.  Given the recent TAVR preceding onset of symptoms, concern for cardioembolic source. Atrial fibrillation on Eliquis Status  post TAVR Coronary artery disease  Check MRI of brain and MRA head and neck (needs to be performed in open MRI) Will reach out to cardiology about possible TEE On ***    Subjective:  Autumn Moyer is a 81 year old right-handed female with a fib on Eliquis, CAD and history of aortic valve stenosis s/p TAVR who presents for diplopia.  History supplemented by referring provider's note.   Underwent TAVR on 12/14/2021.   On 10/18, she was driving and suddenly couldn't focus.  Didn't last long, just a couple of seconds.  A couple of days later, it occurred again, just a couple of seconds.  The next day, she developed monocular vertical diplopia for 5 minutes.  She had no episodes until this past week, lasting 5 minutes.  Since the surgery, she has had other visual disturbance, described seeing blades of light shooting in her vision lasting a few seconds.  They occur every other day.  No associated slurred speech, facial numbness/weakness, headache, confusion, dizziness, or unilateral numbness or weakness.  She saw her ophthalmologist who didn't find any abnormalities.  2D echo on 01/17/2022 showed LVEF >75% with Grade II diastolic dysfunction, mild MVR, moderate TVR and normal structure of prosthetic aortic valve   She has remote history of ocular migraine that resolved after cataract surgery 81 years old.  Visual aura presenting as  scintillating fortification scotoma lasting several hours without headache.     PAST MEDICAL HISTORY: Past Medical History:  Diagnosis Date   Atrial fibrillation, persistent (HCC)    Complication of anesthesia    Hiatal hernia    Hypertension    PONV (postoperative nausea and vomiting)    S/P TAVR (transcatheter aortic valve replacement) 12/14/2021   s/p TAVR with a 48m Edwards S3UR via the TF approach by Dr. TAli Lowe& Dr. WLavonna Monarch  Severe aortic stenosis    Squamous cell carcinoma of skin 01/16/2018   in situ-right knee post (txpbx)   Thyroid disease     PAST SURGICAL HISTORY: Past Surgical History:  Procedure Laterality Date   ABDOMINAL AORTOGRAM N/A 09/22/2021   Procedure: ABDOMINAL AORTOGRAM;  Surgeon: TEarly Osmond MD;  [facility];  Service: Cardiovascular;  Laterality: N/A;   CARDIAC CATHETERIZATION     EYE SURGERY     INTRAOPERATIVE TRANSTHORACIC ECHOCARDIOGRAM N/A 12/14/2021   Procedure: INTRAOPERATIVE TRANSTHORACIC ECHOCARDIOGRAM;  Surgeon: TEarly Osmond MD;  [facility]  Service: Open Heart Surgery;  Laterality: N/A;   MULTIPLE EXTRACTIONS WITH ALVEOLOPLASTY N/A 12/02/2021   Procedure: MULTIPLE EXTRACTION WITH ALVEOLOPLASTY;  Surgeon: OCharlaine Dalton DMD;  [facility]  Service: Dentistry;  Laterality: N/A;   RIGHT HEART CATH AND CORONARY/GRAFT ANGIOGRAPHY N/A 09/22/2021   Procedure: RIGHT HEART CATH AND CORONARY/GRAFT ANGIOGRAPHY;  Surgeon: TEarly Osmond MD;  [facility];  Service: Cardiovascular;  Laterality: N/A;   TOE SURGERY     TRANSCATHETER AORTIC VALVE REPLACEMENT, TRANSFEMORAL N/A 12/14/2021  Procedure: Transcatheter Aortic Valve Replacement, Transfemoral using Edwards 23 MM SAPIEN 3 Ultra;  Surgeon: Early Osmond, MD;  Location: [facility];  Service: Open Heart Surgery;  Laterality: N/A;  Transfemoral approach   TUBAL LIGATION      MEDICATIONS: Current Outpatient Medications on File Prior to Visit   Medication Sig Dispense Refill   acetaminophen (TYLENOL) 500 MG tablet Take 1,000 mg by mouth in the morning.     apixaban (ELIQUIS) 5 MG TABS tablet Take 1 tablet (5 mg total) by mouth 2 (two) times daily. 180 tablet 1   azithromycin (ZITHROMAX) 500 MG tablet Take 1 tablet by mouth 1 hour before dental cleanings and procedures 12 tablet 6   benazepril-hydrochlorthiazide (LOTENSIN HCT) 20-25 MG tablet Take 1 tablet by mouth daily. 90 tablet 1   carboxymethylcellulose (REFRESH TEARS) 0.5 % SOLN Place 2 drops into both eyes See admin instructions. Instill 2 drops into both eyes scheduled every morning & as needed for dry/irritated eyes.     digoxin (LANOXIN) 0.125 MG tablet Take 1 tablet (0.125 mg total) by mouth daily. 90 tablet 3   furosemide (LASIX) 20 MG tablet Take 20 mg by mouth daily as needed for edema or fluid.     levocetirizine (XYZAL) 5 MG tablet Take 1 tablet (5 mg total) by mouth every evening. 90 tablet 1   levothyroxine (SYNTHROID) 88 MCG tablet Take 1 tablet (88 mcg total) by mouth daily. 90 tablet 3   metoprolol tartrate (LOPRESSOR) 50 MG tablet Take 1 tablet (50 mg total) by mouth 2 (two) times daily. 180 tablet 3   mometasone (ELOCON) 0.1 % cream Apply 1 application topically daily. 45 g 2   Multiple Vitamin (MULTIVITAMIN WITH MINERALS) TABS tablet Take 1 tablet by mouth every evening.     pantoprazole (PROTONIX) 40 MG tablet Take 1 tablet (40 mg total) by mouth daily. 90 tablet 1   tacrolimus (PROTOPIC) 0.1 % ointment Apply topically in the morning and at bedtime. 30 g 4   No current facility-administered medications on file prior to visit.    ALLERGIES: Allergies  Allergen Reactions   Noroxin [Norfloxacin] Nausea And Vomiting   Augmentin [Amoxicillin-Pot Clavulanate] Other (See Comments)    Gums swelling, lips numb    FAMILY HISTORY: Family History  Problem Relation Age of Onset   Hypertension Mother    Hyperlipidemia Mother    Heart failure Mother        Age 86    Hyperlipidemia Father    Hypertension Father    Heart disease Father        died of MI after developoing cancer.   Lung cancer Father    Hypertension Sister    Breast cancer Daughter    Breast cancer Paternal Aunt     Objective:  Blood pressure (!) 148/82, pulse 85, height '5\' 4"'$  (1.626 m), weight 153 lb 3.2 oz (69.5 kg), SpO2 96 %. General: No acute distress.  Patient appears well-groomed.   Head:  Normocephalic/atraumatic Eyes:  fundi examined but not visualized Neck: supple, no paraspinal tenderness, full range of motion Back: No paraspinal tenderness Heart: regular rate and rhythm Lungs: Clear to auscultation bilaterally. Vascular: No carotid bruits. Neurological Exam: Mental status: alert and oriented to person, place, and time, speech fluent and not dysarthric, language intact. Cranial nerves: CN I: not tested CN II: pupils equal, round and reactive to light, visual fields intact CN III, IV, VI:  full range of motion, no nystagmus, no ptosis CN V: facial  sensation intact. CN VII: upper and lower face symmetric CN VIII: hearing intact CN IX, X: gag intact, uvula midline CN XI: sternocleidomastoid and trapezius muscles intact CN XII: tongue midline Bulk & Tone: normal, no fasciculations. Motor:  muscle strength 5/5 throughout Sensation:  Pinprick, temperature and vibratory sensation intact. Deep Tendon Reflexes:  2+ throughout,  toes downgoing.   Finger to nose testing:  Without dysmetria.   Heel to shin:  Without dysmetria.   Gait:  Normal station and stride.  Romberg negative.    Thank you for allowing me to take part in the care of this patient.  Metta Clines, DO  CC:  Evelina Dun, FNP  Kathyrn Drown, NP

## 2022-03-14 ENCOUNTER — Encounter (HOSPITAL_COMMUNITY): Payer: PPO

## 2022-03-15 ENCOUNTER — Encounter: Payer: Self-pay | Admitting: Neurology

## 2022-03-16 ENCOUNTER — Encounter (HOSPITAL_COMMUNITY)
Admission: RE | Admit: 2022-03-16 | Discharge: 2022-03-16 | Disposition: A | Discharge status: Home / Self Care | Payer: PPO | Source: Ambulatory Visit | Attending: Internal Medicine | Admitting: Internal Medicine

## 2022-03-16 DIAGNOSIS — Z952 Presence of prosthetic heart valve: Secondary | ICD-10-CM

## 2022-03-16 NOTE — Progress Notes (Signed)
Daily Session Note  Patient Details  Name: Autumn Moyer MRN: [id number] Date of Birth: 21-Feb-1941 Referring Provider:   Flowsheet Row CARDIAC REHAB PHASE II ORIENTATION from 01/07/2022 in [facility]  Referring Provider Dr. Ali Lowe       Encounter Date: 03/16/2022  Check In:  Session Check In - 03/16/22 1100       Check-In   Supervising physician immediately available to respond to emergencies [facility] MD immediately available    Physician(s) Dr. Johnsie Cancel    Location [facility]    Staff Present Leana Roe, BS, Exercise Physiologist;Dalton Sherrie George, MS, ACSM-CEP;Melven Sartorius BSN, RN    Virtual Visit No    Medication changes reported     No    Fall or balance concerns reported    Yes    Comments Patient reports frequently losing her balance.    Tobacco Cessation No Change    Warm-up and Cool-down Performed as group-led instruction    Resistance Training Performed Yes    VAD Patient? No    PAD/SET Patient? No      Pain Assessment   Currently in Pain? Yes    Pain Score 0-No pain    Multiple Pain Sites No             Capillary Blood Glucose: No results found for this or any previous visit (from the past 24 hour(s)).    Social History   Tobacco Use  Smoking Status Never   Passive exposure: Past  Smokeless Tobacco Never    Goals Met:  Independence with exercise equipment Exercise tolerated well No report of concerns or symptoms today Strength training completed today  Goals Unmet:  Not Applicable  Comments: check out at 12:00   Dr. Carlyle Dolly is Medical Director for [facility]

## 2022-03-18 ENCOUNTER — Encounter (HOSPITAL_COMMUNITY)
Admission: RE | Admit: 2022-03-18 | Discharge: 2022-03-18 | Disposition: A | Discharge status: Home / Self Care | Payer: PPO | Source: Ambulatory Visit | Attending: Internal Medicine | Admitting: Internal Medicine

## 2022-03-18 DIAGNOSIS — Z952 Presence of prosthetic heart valve: Secondary | ICD-10-CM

## 2022-03-18 NOTE — Progress Notes (Signed)
Daily Session Note  Patient Details  Name: Autumn Moyer MRN: [id number] Date of Birth: 04/12/1940 Referring Provider:   Flowsheet Row CARDIAC REHAB PHASE II ORIENTATION from 01/07/2022 in [facility]  Referring Provider Dr. Thukkani       Encounter Date: 03/18/2022  Check In:  Session Check In - 03/18/22 1057       Check-In   Supervising physician immediately available to respond to emergencies [facility] MD immediately available    Physician(s) Dr. Nishan    Location [facility]    Staff Present Heather Bailey, BS, Exercise Physiologist;Dalton Fletcher MHA, MS, ACSM-CEP;Phyllis Billingsley, RN;Debra Johnson, RN, BSN    Virtual Visit No    Medication changes reported     No    Fall or balance concerns reported    Yes    Comments Patient reports frequently losing her balance.    Tobacco Cessation No Change    Warm-up and Cool-down Performed as group-led instruction    Resistance Training Performed Yes    VAD Patient? No    PAD/SET Patient? No      Pain Assessment   Currently in Pain? No/denies    Pain Score 0-No pain    Multiple Pain Sites No             Capillary Blood Glucose: No results found for this or any previous visit (from the past 24 hour(s)).    Social History   Tobacco Use  Smoking Status Never   Passive exposure: Past  Smokeless Tobacco Never    Goals Met:  Independence with exercise equipment Exercise tolerated well No report of concerns or symptoms today Strength training completed today  Goals Unmet:  Not Applicable  Comments: check out @ 12:00   Dr. Jonathan Branch is Medical Director for [facility] 

## 2022-03-21 ENCOUNTER — Encounter (HOSPITAL_COMMUNITY): Payer: PPO

## 2022-03-23 ENCOUNTER — Ambulatory Visit (INDEPENDENT_AMBULATORY_CARE_PROVIDER_SITE_OTHER): Payer: PPO

## 2022-03-23 ENCOUNTER — Encounter (HOSPITAL_COMMUNITY)
Admission: RE | Admit: 2022-03-23 | Discharge: 2022-03-23 | Disposition: A | Discharge status: Home / Self Care | Payer: PPO | Source: Ambulatory Visit | Attending: Internal Medicine | Admitting: Internal Medicine

## 2022-03-23 VITALS — Ht 64.0 in | Wt 151.0 lb

## 2022-03-23 DIAGNOSIS — Z0000 Encounter for general adult medical examination without abnormal findings: Secondary | ICD-10-CM

## 2022-03-23 DIAGNOSIS — Z952 Presence of prosthetic heart valve: Secondary | ICD-10-CM | POA: Diagnosis not present

## 2022-03-23 NOTE — Progress Notes (Signed)
Daily Session Note  Patient Details  Name: Autumn Moyer MRN: [id number] Date of Birth: 09/25/1940 Referring Provider:   Flowsheet Row CARDIAC REHAB PHASE II ORIENTATION from 01/07/2022 in [facility]  Referring Provider Dr. Thukkani       Encounter Date: 03/23/2022  Check In:  Session Check In - 03/23/22 1100       Check-In   Supervising physician immediately available to respond to emergencies [facility] immediately available    Physician(s) Dr. McDowell    Location [facility]    Staff Present Heather Bailey, BS, Exercise Physiologist;Debra Johnson, RN, BSN;Hillary Troutman BSN, RN    Virtual Visit No    Medication changes reported     No    Fall or balance concerns reported    Yes    Comments Patient reports frequently losing her balance.    Tobacco Cessation No Change    Warm-up and Cool-down Performed as group-led instruction    Resistance Training Performed Yes    VAD Patient? No    PAD/SET Patient? No      Pain Assessment   Currently in Pain? No/denies    Pain Score 0-No pain    Multiple Pain Sites No             Capillary Blood Glucose: No results found for this or any previous visit (from the past 24 hour(s)).    Social History   Tobacco Use  Smoking Status Never   Passive exposure: Past  Smokeless Tobacco Never    Goals Met:  Independence with exercise equipment Exercise tolerated well No report of concerns or symptoms today Strength training completed today  Goals Unmet:  Not Applicable  Comments: check  out 1200   Dr. Jonathan Branch is Medical Director for  [facility] 

## 2022-03-23 NOTE — Progress Notes (Signed)
Cardiac Individual Treatment Plan  Patient Details  Name: Autumn Moyer MRN: [id number] Date of Birth: February 18, 1941 Referring Provider:   Flowsheet Row CARDIAC REHAB PHASE II ORIENTATION from 01/07/2022 in [facility]  Referring Provider Dr. Ali Lowe       Initial Encounter Date:  Flowsheet Row CARDIAC REHAB PHASE II ORIENTATION from 01/07/2022 in [facility]  Date 01/07/22       Visit Diagnosis: S/P TAVR (transcatheter aortic valve replacement)  Patient's Home Medications on Admission:  Current Outpatient Medications:    acetaminophen (TYLENOL) 500 MG tablet, Take 1,000 mg by mouth in the morning., Disp: , Rfl:    apixaban (ELIQUIS) 5 MG TABS tablet, Take 1 tablet (5 mg total) by mouth 2 (two) times daily., Disp: 180 tablet, Rfl: 1   azithromycin (ZITHROMAX) 500 MG tablet, Take 1 tablet by mouth 1 hour before dental cleanings and procedures, Disp: 12 tablet, Rfl: 6   benazepril-hydrochlorthiazide (LOTENSIN HCT) 20-25 MG tablet, Take 1 tablet by mouth daily., Disp: 90 tablet, Rfl: 1   carboxymethylcellulose (REFRESH TEARS) 0.5 % SOLN, Place 2 drops into both eyes See admin instructions. Instill 2 drops into both eyes scheduled every morning & as needed for dry/irritated eyes., Disp: , Rfl:    digoxin (LANOXIN) 0.125 MG tablet, Take 1 tablet (0.125 mg total) by mouth daily., Disp: 90 tablet, Rfl: 3   furosemide (LASIX) 20 MG tablet, Take 20 mg by mouth daily as needed for edema or fluid., Disp: , Rfl:    levocetirizine (XYZAL) 5 MG tablet, Take 1 tablet (5 mg total) by mouth every evening., Disp: 90 tablet, Rfl: 1   levothyroxine (SYNTHROID) 88 MCG tablet, Take 1 tablet (88 mcg total) by mouth daily., Disp: 90 tablet, Rfl: 3   metoprolol tartrate (LOPRESSOR) 50 MG tablet, Take 1 tablet (50 mg total) by mouth 2 (two) times daily., Disp: 180 tablet, Rfl: 3   mometasone (ELOCON) 0.1 % cream, Apply 1 application topically daily., Disp: 45 g, Rfl:  2   Multiple Vitamin (MULTIVITAMIN WITH MINERALS) TABS tablet, Take 1 tablet by mouth every evening., Disp: , Rfl:    pantoprazole (PROTONIX) 40 MG tablet, Take 1 tablet (40 mg total) by mouth daily., Disp: 90 tablet, Rfl: 1   tacrolimus (PROTOPIC) 0.1 % ointment, Apply topically in the morning and at bedtime., Disp: 30 g, Rfl: 4  Past Medical History: Past Medical History:  Diagnosis Date   Atrial fibrillation, persistent (HCC)    Complication of anesthesia    Hiatal hernia    Hypertension    PONV (postoperative nausea and vomiting)    S/P TAVR (transcatheter aortic valve replacement) 12/14/2021   s/p TAVR with a 77m Edwards S3UR via the TF approach by Dr. TAli Lowe& Dr. WLavonna Monarch  Severe aortic stenosis    Squamous cell carcinoma of skin 01/16/2018   in situ-right knee post (txpbx)   Thyroid disease     Tobacco Use: Social History   Tobacco Use  Smoking Status Never   Passive exposure: Past  Smokeless Tobacco Never    Labs: Review Flowsheet       Latest Ref Rng & Units 06/13/2016 02/07/2018 09/22/2021 12/14/2021  Labs for ITP Cardiac and Pulmonary Rehab  Hemoglobin A1c <7.0 % 6.0  6.0  - -  PH, Arterial 7.35 - 7.45 - - 7.350  7.290   PCO2 arterial 32 - 48 mmHg - - 48.5  60.6   Bicarbonate 20.0 - 28.0 mmol/L - - 27.5  28.8  26.8  29.1   TCO2 22 - 32 mmol/L - - '29  30  28  27  26  30  31   '$ O2 Saturation % - - 71  71  83  96     Capillary Blood Glucose: No results found for: "GLUCAP"   Exercise Target Goals: Exercise Program Goal: Individual exercise prescription set using results from initial 6 min walk test and THRR while considering  patient's activity barriers and safety.   Exercise Prescription Goal: Starting with aerobic activity 30 plus minutes a day, 3 days per week for initial exercise prescription. Provide home exercise prescription and guidelines that participant acknowledges understanding prior to discharge.  Activity Barriers & Risk Stratification:   Activity Barriers & Cardiac Risk Stratification - 01/07/22 1248       Activity Barriers & Cardiac Risk Stratification   Activity Barriers Back Problems;Shortness of Breath;Balance Concerns    Cardiac Risk Stratification High             6 Minute Walk:  6 Minute Walk     Row Name 01/07/22 1352         6 Minute Walk   Phase Initial     Distance 1350 feet     Walk Time 6 minutes     # of Rest Breaks 0     MPH 2.56     METS 3.08     RPE 12     VO2 Peak 10.79     Symptoms No     Resting HR 77 bpm     Resting BP 130/60     Resting Oxygen Saturation  94 %     Exercise Oxygen Saturation  during 6 min walk 95 %     Max Ex. HR 128 bpm     Max Ex. BP 164/74     2 Minute Post BP 120/72              Oxygen Initial Assessment:   Oxygen Re-Evaluation:   Oxygen Discharge (Final Oxygen Re-Evaluation):   Initial Exercise Prescription:  Initial Exercise Prescription - 01/07/22 1300       Date of Initial Exercise RX and Referring Provider   Date 01/07/22    Referring Provider Dr. Ali Lowe    Expected Discharge Date 04/01/22      Treadmill   MPH 2    Grade 0    Minutes 17      NuStep   Level 1    SPM 80    Minutes 22      Prescription Details   Frequency (times per week) 3    Duration Progress to 30 minutes of continuous aerobic without signs/symptoms of physical distress      Intensity   THRR 40-80% of Max Heartrate 56-113    Ratings of Perceived Exertion 11-13    Perceived Dyspnea 0-4      Resistance Training   Training Prescription Yes    Weight 3    Reps 10-15             Perform Capillary Blood Glucose checks as needed.  Exercise Prescription Changes:   Exercise Prescription Changes     Row Name 01/21/22 1300 02/07/22 1200 02/21/22 1100 02/21/22 1400 03/07/22 1200     Response to Exercise   Blood Pressure (Admit) 124/68 118/70 -- 116/68 106/66   Blood Pressure (Exercise) 140/60 128/70 -- 136/78 118/64   Blood Pressure (Exit)  126/60 110/60 -- 116/60 102/62  Heart Rate (Admit) 60 bpm 63 bpm -- 70 bpm 62 bpm   Heart Rate (Exercise) 122 bpm 107 bpm -- 92 bpm 108 bpm   Heart Rate (Exit) 67 bpm 71 bpm -- 77 bpm 70 bpm   Rating of Perceived Exertion (Exercise) 13 11 -- 12 11   Duration Continue with 30 min of aerobic exercise without signs/symptoms of physical distress. Continue with 30 min of aerobic exercise without signs/symptoms of physical distress. -- Continue with 30 min of aerobic exercise without signs/symptoms of physical distress. Continue with 30 min of aerobic exercise without signs/symptoms of physical distress.   Intensity THRR unchanged THRR unchanged -- THRR unchanged THRR unchanged     Progression   Progression Continue to progress workloads to maintain intensity without signs/symptoms of physical distress. Continue to progress workloads to maintain intensity without signs/symptoms of physical distress. -- Continue to progress workloads to maintain intensity without signs/symptoms of physical distress. Continue to progress workloads to maintain intensity without signs/symptoms of physical distress.     Resistance Training   Training Prescription Yes Yes -- Yes Yes   Weight 3 3 -- 3 3   Reps 10-15 10-15 -- 10-15 10-15   Time 10 Minutes 10 Minutes -- 10 Minutes 10 Minutes     Treadmill   MPH 2 2.2 -- 2.3 2.3   Grade 0 0 -- 0 0   Minutes 17 17 -- 17 17   METs 2.53 2.69 -- 2.76 2.76     NuStep   Level 2 2 -- 2 2   SPM 129 121 -- 127 116   Minutes 22 22 -- 22 22   METs 2.73 2.42 -- 2.64 2.47     Home Exercise Plan   Plans to continue exercise at -- -- Home (comment) -- --   Frequency -- -- Add 2 additional days to program exercise sessions. -- --   Initial Home Exercises Provided -- -- 02/21/22 -- --    [place] Name 03/18/22 1200             Response to Exercise   Blood Pressure (Admit) 110/60       Blood Pressure (Exercise) 146/70       Blood Pressure (Exit) 120/64       Heart Rate (Admit)  58 bpm       Heart Rate (Exercise) 111 bpm       Heart Rate (Exit) 67 bpm       Rating of Perceived Exertion (Exercise) 12       Duration Continue with 30 min of aerobic exercise without signs/symptoms of physical distress.       Intensity THRR unchanged         Progression   Progression Continue to progress workloads to maintain intensity without signs/symptoms of physical distress.         Resistance Training   Training Prescription Yes       Weight 3       Reps 10-15       Time 10 Minutes         Treadmill   MPH 2.3       Grade 0       Minutes 17       METs 2.76         NuStep   Level 2       SPM 126       Minutes 22       METs 2.64  Exercise Comments:   Exercise Comments     Row Name 02/21/22 1117           Exercise Comments home exercise reviewed                Exercise Goals and Review:   Exercise Goals     Row Name 01/07/22 1354 01/25/22 0821 02/21/22 1447 03/22/22 1148       Exercise Goals   Increase Physical Activity Yes Yes Yes Yes    Intervention Provide advice, education, support and counseling about physical activity/exercise needs.;Develop an individualized exercise prescription for aerobic and resistive training based on initial evaluation findings, risk stratification, comorbidities and participant's personal goals. Provide advice, education, support and counseling about physical activity/exercise needs.;Develop an individualized exercise prescription for aerobic and resistive training based on initial evaluation findings, risk stratification, comorbidities and participant's personal goals. Provide advice, education, support and counseling about physical activity/exercise needs.;Develop an individualized exercise prescription for aerobic and resistive training based on initial evaluation findings, risk stratification, comorbidities and participant's personal goals. Provide advice, education, support and counseling about  physical activity/exercise needs.;Develop an individualized exercise prescription for aerobic and resistive training based on initial evaluation findings, risk stratification, comorbidities and participant's personal goals.    Expected Outcomes Short Term: Attend rehab on a regular basis to increase amount of physical activity.;Long Term: Add in home exercise to make exercise part of routine and to increase amount of physical activity.;Long Term: Exercising regularly at least 3-5 days a week. Short Term: Attend rehab on a regular basis to increase amount of physical activity.;Long Term: Add in home exercise to make exercise part of routine and to increase amount of physical activity.;Long Term: Exercising regularly at least 3-5 days a week. Short Term: Attend rehab on a regular basis to increase amount of physical activity.;Long Term: Add in home exercise to make exercise part of routine and to increase amount of physical activity.;Long Term: Exercising regularly at least 3-5 days a week. Short Term: Attend rehab on a regular basis to increase amount of physical activity.;Long Term: Add in home exercise to make exercise part of routine and to increase amount of physical activity.;Long Term: Exercising regularly at least 3-5 days a week.    Increase Strength and Stamina Yes Yes Yes Yes    Intervention Provide advice, education, support and counseling about physical activity/exercise needs.;Develop an individualized exercise prescription for aerobic and resistive training based on initial evaluation findings, risk stratification, comorbidities and participant's personal goals. Provide advice, education, support and counseling about physical activity/exercise needs.;Develop an individualized exercise prescription for aerobic and resistive training based on initial evaluation findings, risk stratification, comorbidities and participant's personal goals. Provide advice, education, support and counseling about physical  activity/exercise needs.;Develop an individualized exercise prescription for aerobic and resistive training based on initial evaluation findings, risk stratification, comorbidities and participant's personal goals. Provide advice, education, support and counseling about physical activity/exercise needs.;Develop an individualized exercise prescription for aerobic and resistive training based on initial evaluation findings, risk stratification, comorbidities and participant's personal goals.    Expected Outcomes Short Term: Increase workloads from initial exercise prescription for resistance, speed, and METs.;Short Term: Perform resistance training exercises routinely during rehab and add in resistance training at home;Long Term: Improve cardiorespiratory fitness, muscular endurance and strength as measured by increased METs and functional capacity (6MWT) Short Term: Increase workloads from initial exercise prescription for resistance, speed, and METs.;Short Term: Perform resistance training exercises routinely during rehab and add in resistance training at home;Long Term:  Improve cardiorespiratory fitness, muscular endurance and strength as measured by increased METs and functional capacity (6MWT) Short Term: Increase workloads from initial exercise prescription for resistance, speed, and METs.;Short Term: Perform resistance training exercises routinely during rehab and add in resistance training at home;Long Term: Improve cardiorespiratory fitness, muscular endurance and strength as measured by increased METs and functional capacity (6MWT) Short Term: Increase workloads from initial exercise prescription for resistance, speed, and METs.;Short Term: Perform resistance training exercises routinely during rehab and add in resistance training at home;Long Term: Improve cardiorespiratory fitness, muscular endurance and strength as measured by increased METs and functional capacity (6MWT)    Able to understand and use  rate of perceived exertion (RPE) scale Yes Yes Yes Yes    Intervention Provide education and explanation on how to use RPE scale Provide education and explanation on how to use RPE scale Provide education and explanation on how to use RPE scale Provide education and explanation on how to use RPE scale    Expected Outcomes Short Term: Able to use RPE daily in rehab to express subjective intensity level;Long Term:  Able to use RPE to guide intensity level when exercising independently Short Term: Able to use RPE daily in rehab to express subjective intensity level;Long Term:  Able to use RPE to guide intensity level when exercising independently Short Term: Able to use RPE daily in rehab to express subjective intensity level;Long Term:  Able to use RPE to guide intensity level when exercising independently Short Term: Able to use RPE daily in rehab to express subjective intensity level;Long Term:  Able to use RPE to guide intensity level when exercising independently    Knowledge and understanding of Target Heart Rate Range (THRR) Yes Yes Yes Yes    Intervention Provide education and explanation of THRR including how the numbers were predicted and where they are located for reference Provide education and explanation of THRR including how the numbers were predicted and where they are located for reference Provide education and explanation of THRR including how the numbers were predicted and where they are located for reference Provide education and explanation of THRR including how the numbers were predicted and where they are located for reference    Expected Outcomes Short Term: Able to state/look up THRR;Short Term: Able to use daily as guideline for intensity in rehab;Long Term: Able to use THRR to govern intensity when exercising independently Short Term: Able to state/look up THRR;Short Term: Able to use daily as guideline for intensity in rehab;Long Term: Able to use THRR to govern intensity when  exercising independently Short Term: Able to state/look up THRR;Short Term: Able to use daily as guideline for intensity in rehab;Long Term: Able to use THRR to govern intensity when exercising independently Short Term: Able to state/look up THRR;Short Term: Able to use daily as guideline for intensity in rehab;Long Term: Able to use THRR to govern intensity when exercising independently    Able to check pulse independently Yes Yes Yes Yes    Intervention Provide education and demonstration on how to check pulse in carotid and radial arteries.;Review the importance of being able to check your own pulse for safety during independent exercise Provide education and demonstration on how to check pulse in carotid and radial arteries.;Review the importance of being able to check your own pulse for safety during independent exercise Provide education and demonstration on how to check pulse in carotid and radial arteries.;Review the importance of being able to check your own pulse for safety  during independent exercise Provide education and demonstration on how to check pulse in carotid and radial arteries.;Review the importance of being able to check your own pulse for safety during independent exercise    Expected Outcomes Short Term: Able to explain why pulse checking is important during independent exercise;Long Term: Able to check pulse independently and accurately Short Term: Able to explain why pulse checking is important during independent exercise;Long Term: Able to check pulse independently and accurately Short Term: Able to explain why pulse checking is important during independent exercise;Long Term: Able to check pulse independently and accurately Short Term: Able to explain why pulse checking is important during independent exercise;Long Term: Able to check pulse independently and accurately    Understanding of Exercise Prescription Yes Yes Yes Yes    Intervention Provide education, explanation, and  written materials on patient's individual exercise prescription Provide education, explanation, and written materials on patient's individual exercise prescription Provide education, explanation, and written materials on patient's individual exercise prescription Provide education, explanation, and written materials on patient's individual exercise prescription    Expected Outcomes Long Term: Able to explain home exercise prescription to exercise independently;Short Term: Able to explain program exercise prescription Long Term: Able to explain home exercise prescription to exercise independently;Short Term: Able to explain program exercise prescription Long Term: Able to explain home exercise prescription to exercise independently;Short Term: Able to explain program exercise prescription Long Term: Able to explain home exercise prescription to exercise independently;Short Term: Able to explain program exercise prescription             Exercise Goals Re-Evaluation :  Exercise Goals Re-Evaluation     Row Name 01/25/22 0822 02/21/22 1447 03/22/22 1148         Exercise Goal Re-Evaluation   Exercise Goals Review Increase Physical Activity;Increase Strength and Stamina;Able to understand and use rate of perceived exertion (RPE) scale;Knowledge and understanding of Target Heart Rate Range (THRR);Able to check pulse independently;Understanding of Exercise Prescription Increase Physical Activity;Increase Strength and Stamina;Able to understand and use rate of perceived exertion (RPE) scale;Knowledge and understanding of Target Heart Rate Range (THRR);Able to check pulse independently;Understanding of Exercise Prescription Increase Physical Activity;Able to understand and use rate of perceived exertion (RPE) scale;Increase Strength and Stamina;Knowledge and understanding of Target Heart Rate Range (THRR);Understanding of Exercise Prescription;Able to check pulse independently     Comments Pt has completed 5  sessions of cardiac rehab. She is very motivated during class and is pushing herself. She has already increased her workloads on the treadmill and stepper. She is currently exercising at 2.72 METs on the stepper. Will continue to monitor and progress as able. Pt has completed 14 sessions of cardiac rehab. She continues to push herself and increasing her workload. She is currently not exercising at home but is planning to [place] on her off days from rehab. She is currently exercising at 2.76 METs on the treadmill. will cotninue to montior and progress as able, Pt has completed 18 sessions of CR. She continues to be motivated during class. She is currently exercising at home by using weights and walking. Pt is currently exercising at 2.76 METs on the treadmill. Will continue to monitor and progress as able.     Expected Outcomes Through exercise at rehab and home, the patient will meet their stated goals. Through exercise at rehab and home, the patient will meet their stated goals. Through exercise at rehab and home, the patient will meet their stated goals.  Discharge Exercise Prescription (Final Exercise Prescription Changes):  Exercise Prescription Changes - 03/18/22 1200       Response to Exercise   Blood Pressure (Admit) 110/60    Blood Pressure (Exercise) 146/70    Blood Pressure (Exit) 120/64    Heart Rate (Admit) 58 bpm    Heart Rate (Exercise) 111 bpm    Heart Rate (Exit) 67 bpm    Rating of Perceived Exertion (Exercise) 12    Duration Continue with 30 min of aerobic exercise without signs/symptoms of physical distress.    Intensity THRR unchanged      Progression   Progression Continue to progress workloads to maintain intensity without signs/symptoms of physical distress.      Resistance Training   Training Prescription Yes    Weight 3    Reps 10-15    Time 10 Minutes      Treadmill   MPH 2.3    Grade 0    Minutes 17    METs 2.76      NuStep   Level 2     SPM 126    Minutes 22    METs 2.64             Nutrition:  Target Goals: Understanding of nutrition guidelines, daily intake of sodium '1500mg'$ , cholesterol '200mg'$ , calories 30% from fat and 7% or less from saturated fats, daily to have 5 or more servings of fruits and vegetables.  Biometrics:  Pre Biometrics - 01/07/22 1355       Pre Biometrics   Height '5\' 4"'$  (1.626 m)    Weight 69.8 kg    Waist Circumference 36.5 inches    Hip Circumference 39 inches    Waist to Hip Ratio 0.94 %    BMI (Calculated) 26.4    Triceps Skinfold 26 mm    % Body Fat 39.3 %    Grip Strength 22.1 kg    Flexibility 7 in    Single Leg Stand 5.14 seconds              Nutrition Therapy Plan and Nutrition Goals:  Nutrition Therapy & Goals - 01/07/22 1323       Personal Nutrition Goals   Comments Patient scored 22 on her diet assessment. Handout explained and provided regarding heathier choices. We offer 2 educational sessions on heart healthy nutrition with handouts and assistance with RD referral if patient is interested.      Intervention Plan   Intervention Nutrition handout(s) given to patient.    Expected Outcomes Short Term Goal: Understand basic principles of dietary content, such as calories, fat, sodium, cholesterol and nutrients.             Nutrition Assessments:  Nutrition Assessments - 01/07/22 1323       MEDFICTS Scores   Pre Score 22            MEDIFICTS Score Key: ?70 Need to make dietary changes  40-70 Heart Healthy Diet ? 40 Therapeutic Level Cholesterol Diet   Picture Your Plate Scores: <32 Unhealthy dietary pattern with much room for improvement. 41-50 Dietary pattern unlikely to meet recommendations for good health and room for improvement. 51-60 More healthful dietary pattern, with some room for improvement.  >60 Healthy dietary pattern, although there may be some specific behaviors that could be improved.    Nutrition Goals  Re-Evaluation:   Nutrition Goals Discharge (Final Nutrition Goals Re-Evaluation):   Psychosocial: Target Goals: Acknowledge presence or absence of significant depression and/or stress,  maximize coping skills, provide positive support system. Participant is able to verbalize types and ability to use techniques and skills needed for reducing stress and depression.  Initial Review & Psychosocial Screening:  Initial Psych Review & Screening - 01/07/22 1335       Initial Review   Current issues with None Identified      Family Dynamics   Good Support System? Yes      Barriers   Psychosocial barriers to participate in program There are no identifiable barriers or psychosocial needs.      Screening Interventions   Interventions Encouraged to exercise;Provide feedback about the scores to participant    Expected Outcomes Short Term goal: Utilizing psychosocial counselor, staff and physician to assist with identification of specific Stressors or current issues interfering with healing process. Setting desired goal for each stressor or current issue identified.             Quality of Life Scores:  Quality of Life - 01/07/22 1352       Quality of Life   Select Quality of Life      Quality of Life Scores   Health/Function Pre 22.71 %    Socioeconomic Pre 30 %    Psych/Spiritual Pre 26.57 %    Family Pre 26.3 %    GLOBAL Pre 25.48 %            Scores of 19 and below usually indicate a poorer quality of life in these areas.  A difference of  2-3 points is a clinically meaningful difference.  A difference of 2-3 points in the total score of the Quality of Life Index has been associated with significant improvement in overall quality of life, self-image, physical symptoms, and general health in studies assessing change in quality of life.  PHQ-9: Review Flowsheet  More data exists      01/07/2022 12/17/2021 06/10/2021 05/27/2021 02/03/2021  Depression screen PHQ 2/9  Decreased  Interest 0 0 0 0 0  Down, Depressed, Hopeless 0 0 0 0 0  PHQ - 2 Score 0 0 0 0 0  Altered sleeping 1 - - - -  Tired, decreased energy 1 - - - -  Change in appetite 0 - - - -  Feeling bad or failure about yourself  0 - - - -  Trouble concentrating 0 - - - -  Moving slowly or fidgety/restless 0 - - - -  Suicidal thoughts 0 - - - -  PHQ-9 Score 2 - - - -  Difficult doing work/chores Somewhat difficult - - - -   Interpretation of Total Score  Total Score Depression Severity:  1-4 = Minimal depression, 5-9 = Mild depression, 10-14 = Moderate depression, 15-19 = Moderately severe depression, 20-27 = Severe depression   Psychosocial Evaluation and Intervention:  Psychosocial Evaluation - 01/07/22 1335       Psychosocial Evaluation & Interventions   Interventions Stress management education;Relaxation education;Encouraged to exercise with the program and follow exercise prescription    Comments Patient has no psychosocial barriers or issues identified at her orientation visit. Her PHQ-9 score was 2 due to lack of energy and trouble falling asleep at timess. She is a very pleasant 82 year old female who lives with her husband of many years. They have 3 children, 5 grandchildren and 1 great grandchild that all live near. She says her children and grandchildren are very supportative of she and her husband and she feels very blessed to have them. Her husband  is currently doing our PR program. She is active around the house and is currently walking 3 days/week but wants to do more exercising. She is ready to start the program hoping to gain strength and stamina and have less SOB.    Expected Outcomes Patient will continue to have no psychosocial barriers or issues identified.    Continue Psychosocial Services  No Follow up required             Psychosocial Re-Evaluation:  Psychosocial Re-Evaluation     Ladora Name 01/19/22 1425 02/16/22 0942 03/16/22 1315         Psychosocial Re-Evaluation    Current issues with None Identified None Identified None Identified     Comments Patient is new to the program comleting 4 sessions. She continues to have no psychosocial barriers or issues identified. She seems to enjoy the sessions and demonstrates an interest to improve her health. Patient has comleted 10 sessions. She continues to have no psychosocial barriers or issues identified. She continues to enjoy the sessions and demonstrates an interest to improve her health. She is very interactive with starff and other patient in her class. We will continue to monitor her progress. Patient has comleted 16 sessions. She continues to have no psychosocial barriers or issues identified. She continues to enjoy the sessions and demonstrates an interest to improve her health. She is very interactive with starff and other patient in her class. We will continue to monitor her progress.     Expected Outcomes Patient will continue to have no psychosocial barriers or issues identified. Patient will continue to have no psychosocial barriers or issues identified. Patient will continue to have no psychosocial barriers or issues identified.     Interventions Stress management education;Relaxation education;Encouraged to attend Cardiac Rehabilitation for the exercise Stress management education;Relaxation education;Encouraged to attend Cardiac Rehabilitation for the exercise Stress management education;Relaxation education;Encouraged to attend Cardiac Rehabilitation for the exercise     Continue Psychosocial Services  No Follow up required No Follow up required No Follow up required              Psychosocial Discharge (Final Psychosocial Re-Evaluation):  Psychosocial Re-Evaluation - 03/16/22 1315       Psychosocial Re-Evaluation   Current issues with None Identified    Comments Patient has comleted 16 sessions. She continues to have no psychosocial barriers or issues identified. She continues to enjoy the sessions  and demonstrates an interest to improve her health. She is very interactive with starff and other patient in her class. We will continue to monitor her progress.    Expected Outcomes Patient will continue to have no psychosocial barriers or issues identified.    Interventions Stress management education;Relaxation education;Encouraged to attend Cardiac Rehabilitation for the exercise    Continue Psychosocial Services  No Follow up required             Vocational Rehabilitation: Provide vocational rehab assistance to qualifying candidates.   Vocational Rehab Evaluation & Intervention:  Vocational Rehab - 01/07/22 1325       Vocational Rehab Re-Evaulation   Comments Patient is retired and does not need vocational rehab.             Education: Education Goals: Education classes will be provided on a weekly basis, covering required topics. Participant will state understanding/return demonstration of topics presented.  Learning Barriers/Preferences:  Learning Barriers/Preferences - 01/07/22 1325       Learning Barriers/Preferences   Learning Barriers None    Learning  Preferences Written Material;Skilled Demonstration;Pictoral             Education Topics: Hypertension, Hypertension Reduction -Define heart disease and high blood pressure. Discus how high blood pressure affects the body and ways to reduce high blood pressure.   Exercise and Your Heart -Discuss why it is important to exercise, the FITT principles of exercise, normal and abnormal responses to exercise, and how to exercise safely.   Angina -Discuss definition of angina, causes of angina, treatment of angina, and how to decrease risk of having angina.   Cardiac Medications -Review what the following cardiac medications are used for, how they affect the body, and side effects that may occur when taking the medications.  Medications include Aspirin, Beta blockers, calcium channel blockers, ACE Inhibitors,  angiotensin receptor blockers, diuretics, digoxin, and antihyperlipidemics. Flowsheet Row CARDIAC REHAB PHASE II EXERCISE from 03/16/2022 in [place]  Date 01/12/22  Educator hj  Instruction Review Code 1- Verbalizes Understanding       Congestive Heart Failure -Discuss the definition of CHF, how to live with CHF, the signs and symptoms of CHF, and how keep track of weight and sodium intake. Flowsheet Row CARDIAC REHAB PHASE II EXERCISE from 03/16/2022 in [place]  Date 01/19/22  Educator HB  Instruction Review Code 1- Verbalizes Understanding       Heart Disease and Intimacy -Discus the effect sexual activity has on the heart, how changes occur during intimacy as we age, and safety during sexual activity. Flowsheet Row CARDIAC REHAB PHASE II EXERCISE from 03/16/2022 in [place]  Date 01/26/22  Educator hb  Instruction Review Code 1- Verbalizes Understanding       Smoking Cessation / COPD -Discuss different methods to quit smoking, the health benefits of quitting smoking, and the definition of COPD. Flowsheet Row CARDIAC REHAB PHASE II EXERCISE from 03/16/2022 in [place]  Date 02/02/22  Educator HB  Instruction Review Code 1- Verbalizes Understanding       Nutrition I: Fats -Discuss the types of cholesterol, what cholesterol does to the heart, and how cholesterol levels can be controlled. Flowsheet Row CARDIAC REHAB PHASE II EXERCISE from 03/16/2022 in [place]  Date 02/14/22  Educator HB  Instruction Review Code 1- Verbalizes Understanding       Nutrition II: Labels -Discuss the different components of food labels and how to read food label [place] from 03/16/2022 in [place]  Date 02/16/22  Educator HB  Instruction Review Code 1- Verbalizes Understanding       Heart  Parts/Heart Disease and PAD -Discuss the anatomy of the heart, the pathway of blood circulation through the heart, and these are affected by heart disease.   Stress I: Signs and Symptoms -Discuss the causes of stress, how stress may lead to anxiety and depression, and ways to limit stress.   Stress II: Relaxation -Discuss different types of relaxation techniques to limit stress. Flowsheet Row CARDIAC REHAB PHASE II EXERCISE from 03/16/2022 in [place]  Date 03/16/22  Educator HB  Instruction Review Code 1- Verbalizes Understanding       Warning Signs of Stroke / TIA -Discuss definition of a stroke, what the signs and symptoms are of a stroke, and how to identify when someone is having stroke.   Knowledge Questionnaire Score:  Knowledge Questionnaire Score - 01/07/22 1323       Knowledge Questionnaire Score  Pre Score 22/24             Core Components/Risk Factors/Patient Goals at Admission:  Personal Goals and Risk Factors at Admission - 01/07/22 1325       Core Components/Risk Factors/Patient Goals on Admission    Weight Management Weight Maintenance    Improve shortness of breath with ADL's Yes    Intervention Provide education, individualized exercise plan and daily activity instruction to help decrease symptoms of SOB with activities of daily living.    Expected Outcomes Short Term: Improve cardiorespiratory fitness to achieve a reduction of symptoms when performing ADLs;Long Term: Be able to perform more ADLs without symptoms or delay the onset of symptoms    Hypertension Yes    Intervention Provide education on lifestyle modifcations including regular physical activity/exercise, weight management, moderate sodium restriction and increased consumption of fresh fruit, vegetables, and low fat dairy, alcohol moderation, and smoking cessation.;Monitor prescription use compliance.    Expected Outcomes Short Term: Continued assessment and  intervention until BP is < 140/35m HG in hypertensive participants. < 130/874mHG in hypertensive participants with diabetes, heart failure or chronic kidney disease.;Long Term: Maintenance of blood pressure at goal levels.    Personal Goal Other Yes    Personal Goal Patient wants to improve her strenght and stamina and have less SOB.    Intervention Patient will attend CR 3 days/week with exercise and education.    Expected Outcomes Patient will complete the program meeting both personal and program goals.             Core Components/Risk Factors/Patient Goals Review:   Goals and Risk Factor Review     Row Name 01/19/22 1427 02/16/22 0943 03/16/22 1315         Core Components/Risk Factors/Patient Goals Review   Personal Goals Review Weight Management/Obesity;Hypertension;Other Weight Management/Obesity;Hypertension;Other Weight Management/Obesity;Hypertension;Other     Review Patient was referred to CR with [facility]. She has multiple risk factors for CAD and is participating in the program for risk modification. She has completed 4 sessions. Her current weight is 154.0 maintained from her initial visit. She say JiKathyrn DrownPA with cardiology 10/30. Patient is having Bigeminy during her walk test and when walking on the treadmill. I sent a message to JiKathyrn DrownPA the morning of patient's visit and sent telemetry strips with patient. Office note has not been completed. Patient says she increased her Metoprolol from 25 mg BID to 50 mg BID. Patient's personal goals for the program are to improve her strength and stamina and have less SOB. We will continue to monitor her progress as she works towards meeting these goals. Patient has completed 10 sessions. Her current weight is 154.2 maintained since last 30 day review. She is doing well in the program with consistent attendance and progressions. She continues to have PVC's but no longer in bigeminy on the increased dosage of Metoprolol. When  she saw cardiology 10/30 she was complaining of Diplopia and was referred to Neurology. No appointemt has been made. Patient's personal goals for the program continue to be to improve her strength and stamina and have less SOB. We will continue to monitor her progress as she works towards meeting these goals. Patient has completed 16 sessions. Her current weight is 153.5 gaining 0.7 lbs since last 30 day review. She continues to do well in the program with consistent attendance and progressions. She continues to have PVC's but no longer in bigeminy on the increased dosage of Metoprolol. Her blood  pressure is at goal. Patient was evaluated by Neurology for Diplopia 12/22. Neurologist ordered MRI brain and TEE stating symptoms were concerning for embolic plaque in the heart. No test has been scheduled.  Patient's personal goals for the program continue to be to improve her strength and stamina and have less SOB. We will continue to monitor her progress as she works towards meeting these goals.     Expected Outcomes Patient will complete the program meeting both personal and program goals. Patient will complete the program meeting both personal and program goals. Patient will complete the program meeting both personal and program goals.              Core Components/Risk Factors/Patient Goals at Discharge (Final Review):   Goals and Risk Factor Review - 03/16/22 1315       Core Components/Risk Factors/Patient Goals Review   Personal Goals Review Weight Management/Obesity;Hypertension;Other    Review Patient has completed 16 sessions. Her current weight is 153.5 gaining 0.7 lbs since last 30 day review. She continues to do well in the program with consistent attendance and progressions. She continues to have PVC's but no longer in bigeminy on the increased dosage of Metoprolol. Her blood pressure is at goal. Patient was evaluated by Neurology for Diplopia 12/22. Neurologist ordered MRI brain and TEE  stating symptoms were concerning for embolic plaque in the heart. No test has been scheduled.  Patient's personal goals for the program continue to be to improve her strength and stamina and have less SOB. We will continue to monitor her progress as she works towards meeting these goals.    Expected Outcomes Patient will complete the program meeting both personal and program goals.             ITP Comments:   Comments: ITP REVIEW Pt is making expected progress toward Cardiac Rehab goals after completing 18 sessions. Recommend continued exercise, life style modification, education, and increased stamina and strength.

## 2022-03-23 NOTE — Progress Notes (Signed)
Subjective:   Autumn Moyer is a 82 y.o. female who presents for Medicare Annual (Subsequent) preventive examination. I connected with  [name] on 03/23/22 by a audio enabled telemedicine application and verified that I am speaking with the correct person using two identifiers.  Patient Location: Home  Provider Location: Home Office  I discussed the limitations of evaluation and management by telemedicine. The patient expressed understanding and agreed to proceed.  Review of Systems     Cardiac Risk Factors include: advanced age (>10mn, >>18women);hypertension     Objective:    Today's Vitals   03/23/22 1323  Weight: 151 lb (68.5 kg)  Height: '5\' 4"'$  (1.626 m)   Body mass index is 25.92 kg/m.     03/23/2022    1:26 PM 03/11/2022    8:15 AM 01/07/2022   12:50 PM 12/14/2021    9:00 PM 12/10/2021    1:06 PM 12/02/2021   10:50 AM 11/24/2021   12:16 PM  Advanced Directives  Does Patient Have a Medical Advance Directive? Yes Yes Yes Yes Yes Yes Yes  Type of [place]aramed[place]of [place] will Living will Living will Living will  Living will  Does patient want to make changes to medical advance directive? No - Patient declined   No - Patient declined No - Patient declined  No - Patient declined  Copy of [place] Chart? Yes - validated most recent copy scanned in chart (See row information)          Current Medications (verified) Outpatient Encounter Medications as of 03/23/2022  Medication Sig   acetaminophen (TYLENOL) 500 MG tablet Take 1,000 mg by mouth in the morning.   apixaban (ELIQUIS) 5 MG TABS tablet Take 1 tablet (5 mg total) by mouth 2 (two) times daily.   azithromycin (ZITHROMAX) 500 MG tablet Take 1 tablet by mouth 1 hour before dental cleanings and procedures   benazepril-hydrochlorthiazide (LOTENSIN HCT) 20-25 MG tablet Take 1 tablet by mouth daily.   carboxymethylcellulose  (REFRESH TEARS) 0.5 % SOLN Place 2 drops into both eyes See admin instructions. Instill 2 drops into both eyes scheduled every morning & as needed for dry/irritated eyes.   digoxin (LANOXIN) 0.125 MG tablet Take 1 tablet (0.125 mg total) by mouth daily.   furosemide (LASIX) 20 MG tablet Take 20 mg by mouth daily as needed for edema or fluid.   levocetirizine (XYZAL) 5 MG tablet Take 1 tablet (5 mg total) by mouth every evening.   levothyroxine (SYNTHROID) 88 MCG tablet Take 1 tablet (88 mcg total) by mouth daily.   metoprolol tartrate (LOPRESSOR) 50 MG tablet Take 1 tablet (50 mg total) by mouth 2 (two) times daily.   mometasone (ELOCON) 0.1 % cream Apply 1 application topically daily.   Multiple Vitamin (MULTIVITAMIN WITH MINERALS) TABS tablet Take 1 tablet by mouth every evening.   pantoprazole (PROTONIX) 40 MG tablet Take 1 tablet (40 mg total) by mouth daily.   tacrolimus (PROTOPIC) 0.1 % ointment Apply topically in the morning and at bedtime.   No facility-administered encounter medications on file as of 03/23/2022.    Allergies (verified) Noroxin [norfloxacin] and Augmentin [amoxicillin-pot clavulanate]   History: Past Medical History:  Diagnosis Date   Atrial fibrillation, persistent (HCC)    Complication of anesthesia    Hiatal hernia    Hypertension    PONV (postoperative nausea and vomiting)    S/P TAVR (transcatheter aortic valve  replacement) 12/14/2021   s/p TAVR with a 53m Edwards S3UR via the TF approach by Dr. TAli Lowe& Dr. WLavonna Monarch  Severe aortic stenosis    Squamous cell carcinoma of skin 01/16/2018   in situ-right knee post (txpbx)   Thyroid disease    Past Surgical History:  Procedure Laterality Date   ABDOMINAL AORTOGRAM N/A 09/22/2021   Procedure: ABDOMINAL AORTOGRAM;  Surgeon: TEarly Osmond MD;  [facility];  Service: Cardiovascular;  Laterality: N/A;   CARDIAC CATHETERIZATION     EYE SURGERY     INTRAOPERATIVE TRANSTHORACIC  ECHOCARDIOGRAM N/A 12/14/2021   Procedure: INTRAOPERATIVE TRANSTHORACIC ECHOCARDIOGRAM;  Surgeon: TEarly Osmond MD;  [facility]  Service: Open Heart Surgery;  Laterality: N/A;   MULTIPLE EXTRACTIONS WITH ALVEOLOPLASTY N/A 12/02/2021   Procedure: MULTIPLE EXTRACTION WITH ALVEOLOPLASTY;  Surgeon: OCharlaine Dalton DMD;  [place] [facility]  Service: Dentistry;  Laterality: N/A;   RIGHT HEART CATH AND CORONARY/GRAFT ANGIOGRAPHY N/A 09/22/2021   Procedure: RIGHT HEART CATH AND CORONARY/GRAFT ANGIOGRAPHY;  Surgeon: TEarly Osmond MD;  [facility];  Service: Cardiovascular;  Laterality: N/A;   TOE SURGERY     TRANSCATHETER AORTIC VALVE REPLACEMENT, TRANSFEMORAL N/A 12/14/2021   Procedure: Transcatheter Aortic Valve Replacement, Transfemoral using Edwards 23 MM SAPIEN 3 Ultra;  Surgeon: TEarly Osmond MD;  [facility]  Service: Open Heart Surgery;  Laterality: N/A;  Transfemoral approach   TUBAL LIGATION     Family History  Problem Relation Age of Onset   Migraines Mother    Hypertension Mother    Hyperlipidemia Mother    Heart failure Mother        Age 80  Hyperlipidemia Father    Hypertension Father    Heart disease Father        died of MI after developoing cancer.   Lung cancer Father    Hypertension Sister    Breast cancer Paternal Aunt    Migraines Maternal Grandfather    Migraines Daughter    Breast cancer Daughter    Social History   Socioeconomic History   Marital status: Married    Spouse name: Danny   Number of children: 2   Years of education: Not on file   Highest education level: Not on file  Occupational History   Not on file  Tobacco Use   Smoking status: Never    Passive exposure: Past   Smokeless tobacco: Never  Vaping Use   Vaping Use: Never used  Substance and Sexual Activity   Alcohol use: No    Alcohol/week: 0.0 standard drinks of alcohol   Drug use: No   Sexual activity: Not Currently    Partners: Male  Other Topics  Concern   Not on file  Social History Narrative   Lives with husband.  Married x 56 years in 2022.   2 daughters   5 grand children   1 great grandchild.   Social Determinants of Health   Financial Resource Strain: Low Risk  (03/23/2022)   Overall Financial Resource Strain (CARDIA)    Difficulty of Paying Living Expenses: Not hard at all  Food Insecurity: No Food Insecurity (03/23/2022)   Hunger Vital Sign    Worried About Running Out of Food in the Last Year: Never true    Ran Out of Food in the Last Year: Never true  Transportation Needs: No Transportation Needs (03/23/2022)   PRAPARE - THydrologist(  Medical): No    Lack of Transportation (Non-Medical): No  Physical Activity: Sufficiently Active (03/23/2022)   Exercise Vital Sign    Days of Exercise per Week: 3 days    Minutes of Exercise per Session: 60 min  Stress: No Stress Concern Present (03/23/2022)   [place]    Feeling of Stress : Not at all  Social Connections: [place] (03/23/2022)   Social Connection and Isolation Panel [NHANES]    Frequency of Communication with Friends and Family: More than three times a week    Frequency of Social Gatherings with Friends and Family: More than three times a week    Attends Religious Services: More than 4 times per year    Active Member of [employer] or Organizations: Yes    Attends Music therapist: More than 4 times per year    Marital Status: Married    Tobacco Counseling Counseling given: Not Answered   Clinical Intake:  Pre-visit preparation completed: Yes  Pain : No/denies pain     Nutritional Risks: None Diabetes: No  How often do you need to have someone help you when you read instructions, pamphlets, or other written materials from your doctor or pharmacy?: 1 - Never  Diabetic?no   Interpreter Needed?: No  Information entered by :: Jadene Pierini,  LPN   Activities of Daily Living    03/23/2022    1:26 PM 12/14/2021    9:00 PM  In your present state of health, do you have any difficulty performing the following activities:  Hearing? 0 0  Vision? 0 0  Difficulty concentrating or making decisions? 0 0  Walking or climbing stairs? 0 0  Dressing or bathing? 0 0  Doing errands, shopping? 0 0  Preparing Food and eating ? N   Using the Toilet? N   In the past six months, have you accidently leaked urine? N   Do you have problems with loss of bowel control? N   Managing your Medications? N   Managing your Finances? N   Housekeeping or managing your Housekeeping? N     Patient Care Team: Sharion Balloon, FNP as PCP - General (Family Medicine) Minus Breeding, MD as PCP - Cardiology (Cardiology) Pieter Partridge, DO as Consulting Physician (Neurology)  Indicate any recent Medical Services you may have received from other than [facility] providers in the past year (date may be approximate).     Assessment:   This is a routine wellness examination for [place].  Hearing/Vision screen Vision Screening - Comments:: Wears rx glasses - up to date with routine eye exams with  Dr.tanner  Dietary issues and exercise activities discussed: Current Exercise Habits: Home exercise routine, Type of exercise: walking, Time (Minutes): 60, Frequency (Times/Week): 3, Weekly Exercise (Minutes/Week): 180, Intensity: Mild, Exercise limited by: cardiac condition(s)   Goals Addressed             This Visit's Progress    DIET - REDUCE CALORIE INTAKE   On track    Pt states she would like to lose weight.        Depression Screen    03/23/2022    1:25 PM 01/07/2022    1:22 PM 12/17/2021    9:37 AM 06/10/2021   11:39 AM 05/27/2021    8:21 AM 02/03/2021    3:55 PM 11/27/2020   10:21 AM  PHQ 2/9 Scores  PHQ - 2 Score 0 0 0 0 0 0 0  PHQ- 9 Score  2         Fall Risk    03/23/2022    1:23 PM 03/11/2022    8:15 AM 01/07/2022    1:22 PM 12/17/2021    9:37  AM 06/10/2021   11:39 AM  Fall Risk   Falls in the past year? 0 0 0 0 0  Number falls in past yr: 0 0     Injury with Fall? 0 0     Risk for fall due to : No Fall Risks  Impaired mobility    Follow up Falls prevention discussed  Falls evaluation completed      FALL RISK PREVENTION PERTAINING TO THE HOME:  Any stairs in or around the home? Yes  If so, are there any without handrails? No  Home free of loose throw rugs in walkways, pet beds, electrical cords, etc? No  Adequate lighting in your home to reduce risk of falls? No   ASSISTIVE DEVICES UTILIZED TO PREVENT FALLS:  Life alert? No  Use of a cane, walker or w/c? No  Grab bars in the bathroom? No  Shower chair or bench in shower? No  Elevated toilet seat or a handicapped toilet? No        03/23/2022    1:26 PM 02/03/2021    4:03 PM  6CIT Screen  What Year? 0 points 0 points  What month? 0 points 0 points  What time? 0 points 0 points  Count back from 20 0 points 0 points  Months in reverse 0 points 0 points  Repeat phrase 0 points 0 points  Total Score 0 points 0 points    Immunizations Immunization History  Administered Date(s) Administered   Fluad Quad(high Dose 65+) 12/17/2018, 12/23/2019, 12/17/2021   Influenza, High Dose Seasonal PF 01/05/2016, 01/15/2018   Influenza,inj,Quad PF,6+ Mos 01/09/2015, 12/26/2016   Influenza-Unspecified 11/19/2020   Moderna SARS-COV2 Booster Vaccination 11/17/2020   Moderna Sars-Covid-2 Vaccination 04/02/2019, 05/03/2019, 01/25/2020   Pneumococcal Conjugate-13 07/18/2016   Pneumococcal Polysaccharide-23 01/09/2015   Td 06/20/1998   Tdap 05/26/2016   Zoster Recombinat (Shingrix) 07/17/2020, 09/22/2020   Zoster, Live 10/24/2005    TDAP status: Up to date  Flu Vaccine status: Up to date  Pneumococcal vaccine status: Up to date  Covid-19 vaccine status: Completed vaccines  Qualifies for Shingles Vaccine? Yes   Zostavax completed Yes   Shingrix Completed?: Yes  Screening  Tests Health Maintenance  Topic Date Due   COVID-19 Vaccine (4 - 2023-24 season) 11/19/2021   Medicare Annual Wellness (AWV)  03/24/2023   DTaP/Tdap/Td (3 - Td or Tdap) 05/27/2026   Pneumonia Vaccine 67+ Years old  Completed   INFLUENZA VACCINE  Completed   DEXA SCAN  Completed   Hepatitis C Screening  Completed   Zoster Vaccines- Shingrix  Completed   HPV VACCINES  Aged Out   COLONOSCOPY (Pts 45-89yr Insurance coverage will need to be confirmed)  Discontinued    Health Maintenance  Health Maintenance Due  Topic Date Due   COVID-19 Vaccine (4 - 2023-24 season) 11/19/2021    Colorectal cancer screening: No longer required.   Mammogram status: No longer required due to age.  Bone Density status: Ordered declined . Pt provided with contact info and advised to call to schedule appt.  Lung Cancer Screening: (Low Dose CT Chest recommended if Age 80-80years, 30 pack-year currently smoking OR have quit w/in 15years.) does not qualify.   Lung Cancer Screening Referral: n/a  Additional Screening:  Hepatitis C Screening: does not qualify; Completed 09/18/2019  Vision Screening: Recommended annual ophthalmology exams for early detection of glaucoma and other disorders of the eye. Is the patient up to date with their annual eye exam?  Yes  Who is the provider or what is the name of the office in which the patient attends annual eye exams? Dr.tanner  If pt is not established with a provider, would they like to be referred to a provider to establish care? No .   Dental Screening: Recommended annual dental exams for proper oral hygiene  Community Resource Referral / Chronic Care Management: CRR required this visit?  No   CCM required this visit?  No      Plan:     I have personally reviewed and noted the following in the patient's chart:   Medical and social history Use of alcohol, tobacco or illicit drugs  Current medications and supplements including opioid prescriptions.  Patient is not currently taking opioid prescriptions. Functional ability and status Nutritional status Physical activity Advanced directives List of other physicians Hospitalizations, surgeries, and ER visits in previous 12 months Vitals Screenings to include cognitive, depression, and falls Referrals and appointments  In addition, I have reviewed and discussed with patient certain preventive protocols, quality metrics, and best practice recommendations. A written personalized care plan for preventive services as well as general preventive health recommendations were provided to patient.     RYAH CRIBB, LPN   11/23/3274   Nurse Notes: Declins DEXA

## 2022-03-23 NOTE — Patient Instructions (Signed)
Autumn Moyer , Thank you for taking time to come for your Medicare Wellness Visit. I appreciate your ongoing commitment to your health goals. Please review the following plan we discussed and let me know if I can assist you in the future.   These are the goals we discussed:  Goals      DIET - REDUCE CALORIE INTAKE     Pt states she would like to lose weight.         This is a list of the screening recommended for you and due dates:  Health Maintenance  Topic Date Due   COVID-19 Vaccine (4 - 2023-24 season) 11/19/2021   Medicare Annual Wellness Visit  03/24/2023   DTaP/Tdap/Td vaccine (3 - Td or Tdap) 05/27/2026   Pneumonia Vaccine  Completed   Flu Shot  Completed   DEXA scan (bone density measurement)  Completed   Hepatitis C Screening: USPSTF Recommendation to screen - Ages 53-79 yo.  Completed   Zoster (Shingles) Vaccine  Completed   HPV Vaccine  Aged Out   Colon Cancer Screening  Discontinued    Advanced directives: In Chart   Conditions/risks identified: Aim for 30 minutes of exercise or brisk walking, 6-8 glasses of water, and 5 servings of fruits and vegetables each day.   Next appointment: Follow up in one year for your annual wellness visit    Preventive Care 65 Years and Older, Female Preventive care refers to lifestyle choices and visits with your health care provider that can promote health and wellness. What does preventive care include? A yearly physical exam. This is also called an annual well check. Dental exams once or twice a year. Routine eye exams. Ask your health care provider how often you should have your eyes checked. Personal lifestyle choices, including: Daily care of your teeth and gums. Regular physical activity. Eating a healthy diet. Avoiding tobacco and drug use. Limiting alcohol use. Practicing safe sex. Taking low-dose aspirin every day. Taking vitamin and mineral supplements as recommended by your health care provider. What happens  during an annual well check? The services and screenings done by your health care provider during your annual well check will depend on your age, overall health, lifestyle risk factors, and family history of disease. Counseling  Your health care provider may ask you questions about your: Alcohol use. Tobacco use. Drug use. Emotional well-being. Home and relationship well-being. Sexual activity. Eating habits. History of falls. Memory and ability to understand (cognition). Work and work Statistician. Reproductive health. Screening  You may have the following tests or measurements: Height, weight, and BMI. Blood pressure. Lipid and cholesterol levels. These may be checked every 5 years, or more frequently if you are over 63 years old. Skin check. Lung cancer screening. You may have this screening every year starting at age 64 if you have a 30-pack-year history of smoking and currently smoke or have quit within the past 15 years. Fecal occult blood test (FOBT) of the stool. You may have this test every year starting at age 25. Flexible sigmoidoscopy or colonoscopy. You may have a sigmoidoscopy every 5 years or a colonoscopy every 10 years starting at age 15. Hepatitis C blood test. Hepatitis B blood test. Sexually transmitted disease (STD) testing. Diabetes screening. This is done by checking your blood sugar (glucose) after you have not eaten for a while (fasting). You may have this done every 1-3 years. Bone density scan. This is done to screen for osteoporosis. You may have this done starting  at age 21. Mammogram. This may be done every 1-2 years. Talk to your health care provider about how often you should have regular mammograms. Talk with your health care provider about your test results, treatment options, and if necessary, the need for more tests. Vaccines  Your health care provider may recommend certain vaccines, such as: Influenza vaccine. This is recommended every  year. Tetanus, diphtheria, and acellular pertussis (Tdap, Td) vaccine. You may need a Td booster every 10 years. Zoster vaccine. You may need this after age 22. Pneumococcal 13-valent conjugate (PCV13) vaccine. One dose is recommended after age 41. Pneumococcal polysaccharide (PPSV23) vaccine. One dose is recommended after age 15. Talk to your health care provider about which screenings and vaccines you need and how often you need them. This information is not intended to replace advice given to you by your health care provider. Make sure you discuss any questions you have with your health care provider. Document Released: 04/03/2015 Document Revised: 11/25/2015 Document Reviewed: 01/06/2015 Elsevier Interactive Patient Education  2017 [place] Prevention in the Home Falls can cause injuries. They can happen to people of all ages. There are many things you can do to make your home safe and to help prevent falls. What can I do on the outside of my home? Regularly fix the edges of walkways and driveways and fix any cracks. Remove anything that might make you trip as you walk through a door, such as a raised step or threshold. Trim any bushes or trees on the path to your home. Use bright outdoor lighting. Clear any walking paths of anything that might make someone trip, such as rocks or tools. Regularly check to see if handrails are loose or broken. Make sure that both sides of any steps have handrails. Any raised decks and porches should have guardrails on the edges. Have any leaves, snow, or ice cleared regularly. Use sand or salt on walking paths during winter. Clean up any spills in your garage right away. This includes oil or grease spills. What can I do in the bathroom? Use night lights. Install grab bars by the toilet and in the tub and shower. Do not use towel bars as grab bars. Use non-skid mats or decals in the tub or shower. If you need to sit down in the shower, use a  plastic, non-slip stool. Keep the floor dry. Clean up any water that spills on the floor as soon as it happens. Remove soap buildup in the tub or shower regularly. Attach bath mats securely with double-sided non-slip rug tape. Do not have throw rugs and other things on the floor that can make you trip. What can I do in the bedroom? Use night lights. Make sure that you have a light by your bed that is easy to reach. Do not use any sheets or blankets that are too big for your bed. They should not hang down onto the floor. Have a firm chair that has side arms. You can use this for support while you get dressed. Do not have throw rugs and other things on the floor that can make you trip. What can I do in the kitchen? Clean up any spills right away. Avoid walking on wet floors. Keep items that you use a lot in easy-to-reach places. If you need to reach something above you, use a strong step stool that has a grab bar. Keep electrical cords out of the way. Do not use floor polish or wax that makes  floors slippery. If you must use wax, use non-skid floor wax. Do not have throw rugs and other things on the floor that can make you trip. What can I do with my stairs? Do not leave any items on the stairs. Make sure that there are handrails on both sides of the stairs and use them. Fix handrails that are broken or loose. Make sure that handrails are as long as the stairways. Check any carpeting to make sure that it is firmly attached to the stairs. Fix any carpet that is loose or worn. Avoid having throw rugs at the top or bottom of the stairs. If you do have throw rugs, attach them to the floor with carpet tape. Make sure that you have a light switch at the top of the stairs and the bottom of the stairs. If you do not have them, ask someone to add them for you. What else can I do to help prevent falls? Wear shoes that: Do not have high heels. Have rubber bottoms. Are comfortable and fit you  well. Are closed at the toe. Do not wear sandals. If you use a stepladder: Make sure that it is fully opened. Do not climb a closed stepladder. Make sure that both sides of the stepladder are locked into place. Ask someone to hold it for you, if possible. Clearly mark and make sure that you can see: Any grab bars or handrails. First and last steps. Where the edge of each step is. Use tools that help you move around (mobility aids) if they are needed. These include: Canes. Walkers. Scooters. Crutches. Turn on the lights when you go into a dark area. Replace any light bulbs as soon as they burn out. Set up your furniture so you have a clear path. Avoid moving your furniture around. If any of your floors are uneven, fix them. If there are any pets around you, be aware of where they are. Review your medicines with your doctor. Some medicines can make you feel dizzy. This can increase your chance of falling. Ask your doctor what other things that you can do to help prevent falls. This information is not intended to replace advice given to you by your health care provider. Make sure you discuss any questions you have with your health care provider. Document Released: 01/01/2009 Document Revised: 08/13/2015 Document Reviewed: 04/11/2014 Elsevier Interactive Patient Education  2017 [employer].

## 2022-03-25 ENCOUNTER — Encounter (HOSPITAL_COMMUNITY)
Admission: RE | Admit: 2022-03-25 | Discharge: 2022-03-25 | Disposition: A | Discharge status: Home / Self Care | Payer: PPO | Source: Ambulatory Visit | Attending: Internal Medicine | Admitting: Internal Medicine

## 2022-03-25 DIAGNOSIS — Z952 Presence of prosthetic heart valve: Secondary | ICD-10-CM

## 2022-03-25 NOTE — Progress Notes (Signed)
Daily Session Note  Patient Details  Name: Autumn Moyer MRN: [id number] Date of Birth: 08-18-1940 Referring Provider:   Flowsheet Row CARDIAC REHAB PHASE II ORIENTATION from 01/07/2022 in [place]  Referring Provider Dr. Ali Lowe       Encounter Date: 03/25/2022  Check In:  Session Check In - 03/25/22 1056       Check-In   Supervising physician immediately available to respond to emergencies [facility] MD immediately available    Physician(s) Dr. Domenic Polite    Location [facility]    Staff Present Hoy Register MHA, MS, ACSM-CEP;Debra Wynetta Emery, RN, BSN;Heather Mel Almond, BS, Exercise Physiologist    Virtual Visit No    Medication changes reported     No    Fall or balance concerns reported    Yes    Comments Patient reports frequently losing her balance.    Tobacco Cessation No Change    Warm-up and Cool-down Performed as group-led instruction    Resistance Training Performed Yes    VAD Patient? No    PAD/SET Patient? No      Pain Assessment   Currently in Pain? No/denies    Pain Score 0-No pain    Multiple Pain Sites No             Capillary Blood Glucose: No results found for this or any previous visit (from the past 24 hour(s)).    Social History   Tobacco Use  Smoking Status Never   Passive exposure: Past  Smokeless Tobacco Never    Goals Met:  Independence with exercise equipment Exercise tolerated well No report of concerns or symptoms today Strength training completed today  Goals Unmet:  Not Applicable  Comments: checkout time is 1200   Dr. Carlyle Dolly is Medical Director for [place]

## 2022-03-28 ENCOUNTER — Encounter (HOSPITAL_COMMUNITY)
Admission: RE | Admit: 2022-03-28 | Discharge: 2022-03-28 | Disposition: A | Discharge status: Home / Self Care | Payer: PPO | Source: Ambulatory Visit | Attending: Internal Medicine | Admitting: Internal Medicine

## 2022-03-28 DIAGNOSIS — Z952 Presence of prosthetic heart valve: Secondary | ICD-10-CM

## 2022-03-28 NOTE — Progress Notes (Signed)
Daily Session Note  Patient Details  Name: Autumn Moyer MRN: [id number] Date of Birth: 1940-09-25 Referring Provider:   Flowsheet Row CARDIAC REHAB PHASE II ORIENTATION from 01/07/2022 in [facility]  Referring Provider Dr. Ali Lowe       Encounter Date: 03/28/2022  Check In:  Session Check In - 03/28/22 1057       Check-In   Supervising physician immediately available to respond to emergencies [facility] MD immediately available    Physician(s) Dr Dellia Cloud    Location [facility]    Staff Present Aundra Dubin, RN, BSN;Heather Mel Almond, BS, Exercise Physiologist;Lillymae Duet Hassell Done, RN, BSN    Virtual Visit No    Medication changes reported     No    Fall or balance concerns reported    Yes    Comments Patient reports frequently losing her balance.    Tobacco Cessation No Change    Warm-up and Cool-down Performed as group-led instruction    Resistance Training Performed Yes    VAD Patient? No    PAD/SET Patient? No      Pain Assessment   Currently in Pain? No/denies    Multiple Pain Sites No             Capillary Blood Glucose: No results found for this or any previous visit (from the past 24 hour(s)).    Social History   Tobacco Use  Smoking Status Never   Passive exposure: Past  Smokeless Tobacco Never    Goals Met:  Independence with exercise equipment Exercise tolerated well No report of concerns or symptoms today Strength training completed today  Goals Unmet:  Not Applicable  Comments: Checkout at 1200.   Dr. Carlyle Dolly is Medical Director for [facility]

## 2022-03-30 ENCOUNTER — Encounter (HOSPITAL_COMMUNITY)
Admission: RE | Admit: 2022-03-30 | Discharge: 2022-03-30 | Disposition: A | Discharge status: Home / Self Care | Payer: PPO | Source: Ambulatory Visit | Attending: Internal Medicine | Admitting: Internal Medicine

## 2022-03-30 DIAGNOSIS — Z952 Presence of prosthetic heart valve: Secondary | ICD-10-CM

## 2022-03-30 NOTE — Progress Notes (Signed)
Daily Session Note  Patient Details  Name: Autumn Moyer MRN: [id number] Date of Birth: [birth year] Referring Provider:   Flowsheet Row CARDIAC REHAB PHASE II ORIENTATION from 01/07/2022 in [place]  Referring Provider Dr. Ali Lowe       Encounter Date: 03/30/2022  Check In:  Session Check In - 03/30/22 1058       Check-In   Supervising physician immediately available to respond to emergencies [facility] MD immediately available    Physician(s) Dr Dellia Cloud    Location [facility]    Staff Present Leana Roe, BS, Exercise Physiologist;Henlee Donovan BSN, RN;Debra Wynetta Emery, RN, BSN    Virtual Visit No    Medication changes reported     No    Fall or balance concerns reported    Yes    Comments Patient reports frequently losing her balance.    Tobacco Cessation No Change    Warm-up and Cool-down Performed as group-led instruction    Resistance Training Performed Yes    VAD Patient? No    PAD/SET Patient? No      Pain Assessment   Currently in Pain? No/denies    Pain Score 0-No pain    Multiple Pain Sites No             Capillary Blood Glucose: No results found for this or any previous visit (from the past 24 hour(s)).    Social History   Tobacco Use  Smoking Status Never   Passive exposure: Past  Smokeless Tobacco Never    Goals Met:  Independence with exercise equipment Exercise tolerated well No report of concerns or symptoms today Strength training completed today  Goals Unmet:  Not Applicable  Comments: check out at 12:00   Dr. Carlyle Dolly is Medical Director for [place]

## 2022-04-01 ENCOUNTER — Encounter (HOSPITAL_COMMUNITY)
Admission: RE | Admit: 2022-04-01 | Discharge: 2022-04-01 | Disposition: A | Discharge status: Home / Self Care | Payer: PPO | Source: Ambulatory Visit | Attending: Internal Medicine | Admitting: Internal Medicine

## 2022-04-01 VITALS — Ht 64.0 in | Wt 151.9 lb

## 2022-04-01 DIAGNOSIS — Z952 Presence of prosthetic heart valve: Secondary | ICD-10-CM | POA: Diagnosis not present

## 2022-04-01 NOTE — Progress Notes (Signed)
Daily Session Note  Patient Details  Name: Autumn Moyer MRN: [id number] Date of Birth: October 02, 1940 Referring Provider:   Flowsheet Row CARDIAC REHAB PHASE II ORIENTATION from 01/07/2022 in [place]  Referring Provider Dr. Ali Lowe       Encounter Date: 04/01/2022  Check In:  Session Check In - 04/01/22 1059       Check-In   Supervising physician immediately available to respond to emergencies [facility] MD immediately available    Physician(s) Dr Dellia Cloud    Location [facility]    Staff Present Leana Roe, BS, Exercise Physiologist;Debra Wynetta Emery, RN, Joanette Gula, RN, BSN    Virtual Visit No    Medication changes reported     No    Fall or balance concerns reported    Yes    Comments Patient reports frequently losing her balance.    Tobacco Cessation No Change    Warm-up and Cool-down Performed as group-led instruction    Resistance Training Performed Yes    VAD Patient? No    PAD/SET Patient? No      Pain Assessment   Currently in Pain? No/denies    Pain Score 0-No pain    Multiple Pain Sites No             Capillary Blood Glucose: No results found for this or any previous visit (from the past 24 hour(s)).    Social History   Tobacco Use  Smoking Status Never   Passive exposure: Past  Smokeless Tobacco Never    Goals Met:  Independence with exercise equipment Exercise tolerated well No report of concerns or symptoms today Strength training completed today  Goals Unmet:  Not Applicable  Comments: Checkout at 1200.   Dr. Carlyle Dolly is Medical Director for [facility]

## 2022-04-04 ENCOUNTER — Telehealth: Payer: Self-pay | Admitting: Anesthesiology

## 2022-04-04 NOTE — Telephone Encounter (Signed)
Pt called stating that Dr Tomi Likens had ordered an MRI for her, she has not heard back from Imaging yet. Pt requests call back.

## 2022-04-04 NOTE — Telephone Encounter (Signed)
Called [facility] and they are closed today.

## 2022-04-05 NOTE — Telephone Encounter (Signed)
[facility] to call her, no pa require, awaiting scheduling.

## 2022-04-05 NOTE — Telephone Encounter (Signed)
Pt's wife called back in stating she never heard back from our office. I explained we were still waiting on the information because [facility] was closed yesterday. She would like a call back today.

## 2022-04-05 NOTE — Progress Notes (Deleted)
Discharge Progress Report  Patient Details  Name: Autumn Moyer MRN: [id number] Date of Birth: [birth year] Referring Provider:   Flowsheet Row CARDIAC REHAB PHASE II ORIENTATION from 01/07/2022 in [place]  Referring Provider Dr. Ali Lowe        Number of Visits: 23  Reason for Discharge:  Patient reached a stable level of exercise. Patient independent in their exercise. Patient has met program and personal goals.  Smoking History:  Social History   Tobacco Use  Smoking Status Never   Passive exposure: Past  Smokeless Tobacco Never    Diagnosis:  S/P TAVR (transcatheter aortic valve replacement)  ADL [facility]:   Initial Exercise Prescription:  Initial Exercise Prescription - 01/07/22 1300       Date of Initial Exercise RX and Referring Provider   Date 01/07/22    Referring Provider Dr. Ali Lowe    Expected Discharge Date 04/01/22      Treadmill   MPH 2    Grade 0    Minutes 17      NuStep   Level 1    SPM 80    Minutes 22      Prescription Details   Frequency (times per week) 3    Duration Progress to 30 minutes of continuous aerobic without signs/symptoms of physical distress      Intensity   THRR 40-80% of Max Heartrate 56-113    Ratings of Perceived Exertion 11-13    Perceived Dyspnea 0-4      Resistance Training   Training Prescription Yes    Weight 3    Reps 10-15             Discharge Exercise Prescription (Final Exercise Prescription Changes):  Exercise Prescription Changes - 03/18/22 1200       Response to Exercise   Blood Pressure (Admit) 110/60    Blood Pressure (Exercise) 146/70    Blood Pressure (Exit) 120/64    Heart Rate (Admit) 58 bpm    Heart Rate (Exercise) 111 bpm    Heart Rate (Exit) 67 bpm    Rating of Perceived Exertion (Exercise) 12    Duration Continue with 30 min of aerobic exercise without signs/symptoms of physical distress.    Intensity THRR unchanged      Progression   Progression  Continue to progress workloads to maintain intensity without signs/symptoms of physical distress.      Resistance Training   Training Prescription Yes    Weight 3    Reps 10-15    Time 10 Minutes      Treadmill   MPH 2.3    Grade 0    Minutes 17    METs 2.76      NuStep   Level 2    SPM 126    Minutes 22    METs 2.64             Functional Capacity:  6 Minute Walk     Row Name 01/07/22 1352 04/01/22 1129       6 Minute Walk   Phase Initial Discharge    Distance 1350 feet 1600 feet    Walk Time 6 minutes 6 minutes    # of Rest Breaks 0 0    MPH 2.56 3.03    METS 3.08 3.13    RPE 12 12    VO2 Peak 10.79 10.97    Symptoms No No    Resting HR 77 bpm 66 bpm    Resting BP 130/60  112/60    Resting Oxygen Saturation  94 % 97 %    Exercise Oxygen Saturation  during 6 min walk 95 % 94 %    Max Ex. HR 128 bpm 120 bpm    Max Ex. BP 164/74 130/60    2 Minute Post BP 120/72 118/60             Psychological, QOL, Others - Outcomes: PHQ 2/9:    03/23/2022    1:25 PM 01/07/2022    1:22 PM 12/17/2021    9:37 AM 06/10/2021   11:39 AM 05/27/2021    8:21 AM  Depression screen PHQ 2/9  Decreased Interest 0 0 0 0 0  Down, Depressed, Hopeless 0 0 0 0 0  PHQ - 2 Score 0 0 0 0 0  Altered sleeping  1     Tired, decreased energy  1     Change in appetite  0     Feeling bad or failure about yourself   0     Trouble concentrating  0     Moving slowly or fidgety/restless  0     Suicidal thoughts  0     PHQ-9 Score  2     Difficult doing work/chores  Somewhat difficult       Quality of Life:  Quality of Life - 04/01/22 1131       Quality of Life   Select Quality of Life      Quality of Life Scores   Health/Function Pre 22.71 %    Health/Function Post 25.83 %    Health/Function % Change 13.74 %    Socioeconomic Pre 30 %    Socioeconomic Post 30 %    Socioeconomic % Change  0 %    Psych/Spiritual Pre 26.57 %    Psych/Spiritual Post 26.36 %    Psych/Spiritual %  Change -0.79 %    Family Pre 26.3 %    Family Post 28.8 %    Family % Change 9.51 %    GLOBAL Pre 25.48 %    GLOBAL Post 27.15 %    GLOBAL % Change 6.55 %             Personal Goals: Goals established at orientation with interventions provided to work toward goal.  Personal Goals and Risk Factors at Admission - 01/07/22 1325       Core Components/Risk Factors/Patient Goals on Admission    Weight Management Weight Maintenance    Improve shortness of breath with ADL's Yes    Intervention Provide education, individualized exercise plan and daily activity instruction to help decrease symptoms of SOB with activities of daily living.    Expected Outcomes Short Term: Improve cardiorespiratory fitness to achieve a reduction of symptoms when performing ADLs;Long Term: Be able to perform more ADLs without symptoms or delay the onset of symptoms    Hypertension Yes    Intervention Provide education on lifestyle modifcations including regular physical activity/exercise, weight management, moderate sodium restriction and increased consumption of fresh fruit, vegetables, and low fat dairy, alcohol moderation, and smoking cessation.;Monitor prescription use compliance.    Expected Outcomes Short Term: Continued assessment and intervention until BP is < 140/87m HG in hypertensive participants. < 130/827mHG in hypertensive participants with diabetes, heart failure or chronic kidney disease.;Long Term: Maintenance of blood pressure at goal levels.    Personal Goal Other Yes    Personal Goal Patient wants to improve her strenght and stamina and have less SOB.  Intervention Patient will attend CR 3 days/week with exercise and education.    Expected Outcomes Patient will complete the program meeting both personal and program goals.              Personal Goals Discharge:  Goals and Risk Factor Review     Row Name 01/19/22 1427 02/16/22 0943 03/16/22 1315 04/05/22 1121       Core  Components/Risk Factors/Patient Goals Review   Personal Goals Review Weight Management/Obesity;Hypertension;Other Weight Management/Obesity;Hypertension;Other Weight Management/Obesity;Hypertension;Other Weight Management/Obesity;Hypertension;Other    Review Patient was referred to CR with TAVR. She has multiple risk factors for CAD and is participating in the program for risk modification. She has completed 4 sessions. Her current weight is 154.0 maintained from her initial visit. She say Kathyrn Drown, PA with cardiology 10/30. Patient is having Bigeminy during her walk test and when walking on the treadmill. I sent a message to Kathyrn Drown, PA the morning of patient's visit and sent telemetry strips with patient. Office note has not been completed. Patient says she increased her Metoprolol from 25 mg BID to 50 mg BID. Patient's personal goals for the program are to improve her strength and stamina and have less SOB. We will continue to monitor her progress as she works towards meeting these goals. Patient has completed 10 sessions. Her current weight is 154.2 maintained since last 30 day review. She is doing well in the program with consistent attendance and progressions. She continues to have PVC's but no longer in bigeminy on the increased dosage of Metoprolol. When she saw cardiology 10/30 she was complaining of Diplopia and was referred to Neurology. No appointemt has been made. Patient's personal goals for the program continue to be to improve her strength and stamina and have less SOB. We will continue to monitor her progress as she works towards meeting these goals. Patient has completed 16 sessions. Her current weight is 153.5 gaining 0.7 lbs since last 30 day review. She continues to do well in the program with consistent attendance and progressions. She continues to have PVC's but no longer in bigeminy on the increased dosage of Metoprolol. Her blood pressure is at goal. Patient was evaluated by  Neurology for Diplopia 12/22. Neurologist ordered MRI brain and TEE stating symptoms were concerning for embolic plaque in the heart. No test has been scheduled.  Patient's personal goals for the program continue to be to improve her strength and stamina and have less SOB. We will continue to monitor her progress as she works towards meeting these goals. Pt graduated from CR after 23 sessions. Her weight was stable while she was in the program. She had good attendance for the most part, she did miss some time when she had COVID. Her vitals were WNL's. Her PVC's continued, but she was no longer having bigeminy after her Metoprolol dosage was increased. She was able to increase her strength and stamina, and she reports that her SOB with exertion has decreased.    Expected Outcomes Patient will complete the program meeting both personal and program goals. Patient will complete the program meeting both personal and program goals. Patient will complete the program meeting both personal and program goals. Pt will continue to work towards her goals post discharge.             Exercise Goals and Review:  Exercise Goals     Row Name 01/07/22 1354 01/25/22 0821 02/21/22 1447 03/22/22 1148       Exercise Goals  Increase Physical Activity Yes Yes Yes Yes    Intervention Provide advice, education, support and counseling about physical activity/exercise needs.;Develop an individualized exercise prescription for aerobic and resistive training based on initial evaluation findings, risk stratification, comorbidities and participant's personal goals. Provide advice, education, support and counseling about physical activity/exercise needs.;Develop an individualized exercise prescription for aerobic and resistive training based on initial evaluation findings, risk stratification, comorbidities and participant's personal goals. Provide advice, education, support and counseling about physical activity/exercise  needs.;Develop an individualized exercise prescription for aerobic and resistive training based on initial evaluation findings, risk stratification, comorbidities and participant's personal goals. Provide advice, education, support and counseling about physical activity/exercise needs.;Develop an individualized exercise prescription for aerobic and resistive training based on initial evaluation findings, risk stratification, comorbidities and participant's personal goals.    Expected Outcomes Short Term: Attend rehab on a regular basis to increase amount of physical activity.;Long Term: Add in home exercise to make exercise part of routine and to increase amount of physical activity.;Long Term: Exercising regularly at least 3-5 days a week. Short Term: Attend rehab on a regular basis to increase amount of physical activity.;Long Term: Add in home exercise to make exercise part of routine and to increase amount of physical activity.;Long Term: Exercising regularly at least 3-5 days a week. Short Term: Attend rehab on a regular basis to increase amount of physical activity.;Long Term: Add in home exercise to make exercise part of routine and to increase amount of physical activity.;Long Term: Exercising regularly at least 3-5 days a week. Short Term: Attend rehab on a regular basis to increase amount of physical activity.;Long Term: Add in home exercise to make exercise part of routine and to increase amount of physical activity.;Long Term: Exercising regularly at least 3-5 days a week.    Increase Strength and Stamina Yes Yes Yes Yes    Intervention Provide advice, education, support and counseling about physical activity/exercise needs.;Develop an individualized exercise prescription for aerobic and resistive training based on initial evaluation findings, risk stratification, comorbidities and participant's personal goals. Provide advice, education, support and counseling about physical activity/exercise  needs.;Develop an individualized exercise prescription for aerobic and resistive training based on initial evaluation findings, risk stratification, comorbidities and participant's personal goals. Provide advice, education, support and counseling about physical activity/exercise needs.;Develop an individualized exercise prescription for aerobic and resistive training based on initial evaluation findings, risk stratification, comorbidities and participant's personal goals. Provide advice, education, support and counseling about physical activity/exercise needs.;Develop an individualized exercise prescription for aerobic and resistive training based on initial evaluation findings, risk stratification, comorbidities and participant's personal goals.    Expected Outcomes Short Term: Increase workloads from initial exercise prescription for resistance, speed, and METs.;Short Term: Perform resistance training exercises routinely during rehab and add in resistance training at home;Long Term: Improve cardiorespiratory fitness, muscular endurance and strength as measured by increased METs and functional capacity (6MWT) Short Term: Increase workloads from initial exercise prescription for resistance, speed, and METs.;Short Term: Perform resistance training exercises routinely during rehab and add in resistance training at home;Long Term: Improve cardiorespiratory fitness, muscular endurance and strength as measured by increased METs and functional capacity (6MWT) Short Term: Increase workloads from initial exercise prescription for resistance, speed, and METs.;Short Term: Perform resistance training exercises routinely during rehab and add in resistance training at home;Long Term: Improve cardiorespiratory fitness, muscular endurance and strength as measured by increased METs and functional capacity (6MWT) Short Term: Increase workloads from initial exercise prescription for resistance, speed, and METs.;Short Term: Perform  resistance training exercises routinely during rehab and add in resistance training at home;Long Term: Improve cardiorespiratory fitness, muscular endurance and strength as measured by increased METs and functional capacity (6MWT)    Able to understand and use rate of perceived exertion (RPE) scale Yes Yes Yes Yes    Intervention Provide education and explanation on how to use RPE scale Provide education and explanation on how to use RPE scale Provide education and explanation on how to use RPE scale Provide education and explanation on how to use RPE scale    Expected Outcomes Short Term: Able to use RPE daily in rehab to express subjective intensity level;Long Term:  Able to use RPE to guide intensity level when exercising independently Short Term: Able to use RPE daily in rehab to express subjective intensity level;Long Term:  Able to use RPE to guide intensity level when exercising independently Short Term: Able to use RPE daily in rehab to express subjective intensity level;Long Term:  Able to use RPE to guide intensity level when exercising independently Short Term: Able to use RPE daily in rehab to express subjective intensity level;Long Term:  Able to use RPE to guide intensity level when exercising independently    Knowledge and understanding of Target Heart Rate Range (THRR) Yes Yes Yes Yes    Intervention Provide education and explanation of THRR including how the numbers were predicted and where they are located for reference Provide education and explanation of THRR including how the numbers were predicted and where they are located for reference Provide education and explanation of THRR including how the numbers were predicted and where they are located for reference Provide education and explanation of THRR including how the numbers were predicted and where they are located for reference    Expected Outcomes Short Term: Able to state/look up THRR;Short Term: Able to use daily as guideline for  intensity in rehab;Long Term: Able to use THRR to govern intensity when exercising independently Short Term: Able to state/look up THRR;Short Term: Able to use daily as guideline for intensity in rehab;Long Term: Able to use THRR to govern intensity when exercising independently Short Term: Able to state/look up THRR;Short Term: Able to use daily as guideline for intensity in rehab;Long Term: Able to use THRR to govern intensity when exercising independently Short Term: Able to state/look up THRR;Short Term: Able to use daily as guideline for intensity in rehab;Long Term: Able to use THRR to govern intensity when exercising independently    Able to check pulse independently Yes Yes Yes Yes    Intervention Provide education and demonstration on how to check pulse in carotid and radial arteries.;Review the importance of being able to check your own pulse for safety during independent exercise Provide education and demonstration on how to check pulse in carotid and radial arteries.;Review the importance of being able to check your own pulse for safety during independent exercise Provide education and demonstration on how to check pulse in carotid and radial arteries.;Review the importance of being able to check your own pulse for safety during independent exercise Provide education and demonstration on how to check pulse in carotid and radial arteries.;Review the importance of being able to check your own pulse for safety during independent exercise    Expected Outcomes Short Term: Able to explain why pulse checking is important during independent exercise;Long Term: Able to check pulse independently and accurately Short Term: Able to explain why pulse checking is important during independent exercise;Long Term: Able to check pulse independently  and accurately Short Term: Able to explain why pulse checking is important during independent exercise;Long Term: Able to check pulse independently and accurately Short  Term: Able to explain why pulse checking is important during independent exercise;Long Term: Able to check pulse independently and accurately    Understanding of Exercise Prescription Yes Yes Yes Yes    Intervention Provide education, explanation, and written materials on patient's individual exercise prescription Provide education, explanation, and written materials on patient's individual exercise prescription Provide education, explanation, and written materials on patient's individual exercise prescription Provide education, explanation, and written materials on patient's individual exercise prescription    Expected Outcomes Long Term: Able to explain home exercise prescription to exercise independently;Short Term: Able to explain program exercise prescription Long Term: Able to explain home exercise prescription to exercise independently;Short Term: Able to explain program exercise prescription Long Term: Able to explain home exercise prescription to exercise independently;Short Term: Able to explain program exercise prescription Long Term: Able to explain home exercise prescription to exercise independently;Short Term: Able to explain program exercise prescription             Exercise Goals Re-Evaluation:  Exercise Goals Re-Evaluation     Row Name 01/25/22 0822 02/21/22 1447 03/22/22 1148         Exercise Goal Re-Evaluation   Exercise Goals Review Increase Physical Activity;Increase Strength and Stamina;Able to understand and use rate of perceived exertion (RPE) scale;Knowledge and understanding of Target Heart Rate Range (THRR);Able to check pulse independently;Understanding of Exercise Prescription Increase Physical Activity;Increase Strength and Stamina;Able to understand and use rate of perceived exertion (RPE) scale;Knowledge and understanding of Target Heart Rate Range (THRR);Able to check pulse independently;Understanding of Exercise Prescription Increase Physical Activity;Able to  understand and use rate of perceived exertion (RPE) scale;Increase Strength and Stamina;Knowledge and understanding of Target Heart Rate Range (THRR);Understanding of Exercise Prescription;Able to check pulse independently     Comments Pt has completed 5 sessions of cardiac rehab. She is very motivated during class and is pushing herself. She has already increased her workloads on the treadmill and stepper. She is currently exercising at 2.72 METs on the stepper. Will continue to monitor and progress as able. Pt has completed 14 sessions of cardiac rehab. She continues to push herself and increasing her workload. She is currently not exercising at home but is planning to go [place] on her off days from rehab. She is currently exercising at 2.76 METs on the treadmill. will cotninue to montior and progress as able, Pt has completed 18 sessions of CR. She continues to be motivated during class. She is currently exercising at home by using weights and walking. Pt is currently exercising at 2.76 METs on the treadmill. Will continue to monitor and progress as able.     Expected Outcomes Through exercise at rehab and home, the patient will meet their stated goals. Through exercise at rehab and home, the patient will meet their stated goals. Through exercise at rehab and home, the patient will meet their stated goals.              Nutrition & Weight - Outcomes:  Pre Biometrics - 01/07/22 1355       Pre Biometrics   Height '5\' 4"'$  (1.626 m)    Weight 153 lb 14.1 oz (69.8 kg)    Waist Circumference 36.5 inches    Hip Circumference 39 inches    Waist to Hip Ratio 0.94 %    BMI (Calculated) 26.4    Triceps Skinfold 26  mm    % Body Fat 39.3 %    Grip Strength 22.1 kg    Flexibility 7 in    Single Leg Stand 5.14 seconds             Post Biometrics - 04/01/22 1130        Post  Biometrics   Height '5\' 4"'$  (1.626 m)    Weight 151 lb 14.4 oz (68.9 kg)    Waist Circumference 37 inches    Hip  Circumference 38.5 inches    Waist to Hip Ratio 0.96 %    BMI (Calculated) 26.06    Triceps Skinfold 25 mm    % Body Fat 39.1 %    Grip Strength 23.6 kg    Flexibility 7 in    Single Leg Stand 15 seconds             Nutrition:  Nutrition Therapy & Goals - 01/07/22 1323       Personal Nutrition Goals   Comments Patient scored 22 on her diet assessment. Handout explained and provided regarding heathier choices. We offer 2 educational sessions on heart healthy nutrition with handouts and assistance with RD referral if patient is interested.      Intervention Plan   Intervention Nutrition handout(s) given to patient.    Expected Outcomes Short Term Goal: Understand basic principles of dietary content, such as calories, fat, sodium, cholesterol and nutrients.             Nutrition Discharge:  Nutrition Assessments - 01/07/22 1323       MEDFICTS Scores   Pre Score 22             Education Questionnaire Score:  Knowledge Questionnaire Score - 01/07/22 1323       Knowledge Questionnaire Score   Pre Score 22/24             Goals reviewed with patient; copy given to patient. Pt graduated from CR after 23 sessions. She was able to improve her walk test distance by 18.5%, and her MET level was 3.32. She plans to walk at home to continue her exercise.

## 2022-04-05 NOTE — Progress Notes (Signed)
Discharge Progress Report  Patient Details  Name: Autumn Moyer MRN: [id number] Date of Birth: [birth year] Referring Provider:   Flowsheet Row CARDIAC REHAB PHASE II ORIENTATION from 01/07/2022 in [facility]  Referring Provider Dr. Ali Lowe        Number of Visits: 23  Reason for Discharge:  Patient reached a stable level of exercise. Patient independent in their exercise. Patient has met program and personal goals.  Smoking History:  Social History   Tobacco Use  Smoking Status Never   Passive exposure: Past  Smokeless Tobacco Never    Diagnosis:  S/P TAVR (transcatheter aortic valve replacement)  ADL [facility]SD:   Initial Exercise Prescription:  Initial Exercise Prescription - 01/07/22 1300       Date of Initial Exercise RX and Referring Provider   Date 01/07/22    Referring Provider Dr. Ali Lowe    Expected Discharge Date 04/01/22      Treadmill   MPH 2    Grade 0    Minutes 17      NuStep   Level 1    SPM 80    Minutes 22      Prescription Details   Frequency (times per week) 3    Duration Progress to 30 minutes of continuous aerobic without signs/symptoms of physical distress      Intensity   THRR 40-80% of Max Heartrate 56-113    Ratings of Perceived Exertion 11-13    Perceived Dyspnea 0-4      Resistance Training   Training Prescription Yes    Weight 3    Reps 10-15             Discharge Exercise Prescription (Final Exercise Prescription Changes):  Exercise Prescription Changes - 03/18/22 1200       Response to Exercise   Blood Pressure (Admit) 110/60    Blood Pressure (Exercise) 146/70    Blood Pressure (Exit) 120/64    Heart Rate (Admit) 58 bpm    Heart Rate (Exercise) 111 bpm    Heart Rate (Exit) 67 bpm    Rating of Perceived Exertion (Exercise) 12    Duration Continue with 30 min of aerobic exercise without signs/symptoms of physical distress.    Intensity THRR unchanged      Progression   Progression  Continue to progress workloads to maintain intensity without signs/symptoms of physical distress.      Resistance Training   Training Prescription Yes    Weight 3    Reps 10-15    Time 10 Minutes      Treadmill   MPH 2.3    Grade 0    Minutes 17    METs 2.76      NuStep   Level 2    SPM 126    Minutes 22    METs 2.64             Functional Capacity:  6 Minute Walk     Row Name 01/07/22 1352 04/01/22 1129       6 Minute Walk   Phase Initial Discharge    Distance 1350 feet 1600 feet    Walk Time 6 minutes 6 minutes    # of Rest Breaks 0 0    MPH 2.56 3.03    METS 3.08 3.13    RPE 12 12    VO2 Peak 10.79 10.97    Symptoms No No    Resting HR 77 bpm 66 bpm    Resting BP 130/60  112/60    Resting Oxygen Saturation  94 % 97 %    Exercise Oxygen Saturation  during 6 min walk 95 % 94 %    Max Ex. HR 128 bpm 120 bpm    Max Ex. BP 164/74 130/60    2 Minute Post BP 120/72 118/60             Psychological, QOL, Others - Outcomes: PHQ 2/9:    04/05/2022   11:36 AM 03/23/2022    1:25 PM 01/07/2022    1:22 PM 12/17/2021    9:37 AM 06/10/2021   11:39 AM  Depression screen PHQ 2/9  Decreased Interest 0 0 0 0 0  Down, Depressed, Hopeless 0 0 0 0 0  PHQ - 2 Score 0 0 0 0 0  Altered sleeping 1  1    Tired, decreased energy 1  1    Change in appetite 1  0    Feeling bad or failure about yourself  0  0    Trouble concentrating 0  0    Moving slowly or fidgety/restless 0  0    Suicidal thoughts 0  0    PHQ-9 Score 3  2    Difficult doing work/chores Somewhat difficult  Somewhat difficult      Quality of Life:  Quality of Life - 04/01/22 1131       Quality of Life   Select Quality of Life      Quality of Life Scores   Health/Function Pre 22.71 %    Health/Function Post 25.83 %    Health/Function % Change 13.74 %    Socioeconomic Pre 30 %    Socioeconomic Post 30 %    Socioeconomic % Change  0 %    Psych/Spiritual Pre 26.57 %    Psych/Spiritual Post  26.36 %    Psych/Spiritual % Change -0.79 %    Family Pre 26.3 %    Family Post 28.8 %    Family % Change 9.51 %    GLOBAL Pre 25.48 %    GLOBAL Post 27.15 %    GLOBAL % Change 6.55 %             Personal Goals: Goals established at orientation with interventions provided to work toward goal.  Personal Goals and Risk Factors at Admission - 01/07/22 1325       Core Components/Risk Factors/Patient Goals on Admission    Weight Management Weight Maintenance    Improve shortness of breath with ADL's Yes    Intervention Provide education, individualized exercise plan and daily activity instruction to help decrease symptoms of SOB with activities of daily living.    Expected Outcomes Short Term: Improve cardiorespiratory fitness to achieve a reduction of symptoms when performing ADLs;Long Term: Be able to perform more ADLs without symptoms or delay the onset of symptoms    Hypertension Yes    Intervention Provide education on lifestyle modifcations including regular physical activity/exercise, weight management, moderate sodium restriction and increased consumption of fresh fruit, vegetables, and low fat dairy, alcohol moderation, and smoking cessation.;Monitor prescription use compliance.    Expected Outcomes Short Term: Continued assessment and intervention until BP is < 140/49m HG in hypertensive participants. < 130/874mHG in hypertensive participants with diabetes, heart failure or chronic kidney disease.;Long Term: Maintenance of blood pressure at goal levels.    Personal Goal Other Yes    Personal Goal Patient wants to improve her strenght and stamina and have less SOB.  Intervention Patient will attend CR 3 days/week with exercise and education.    Expected Outcomes Patient will complete the program meeting both personal and program goals.              Personal Goals Discharge:  Goals and Risk Factor Review     Row Name 01/19/22 1427 02/16/22 0943 03/16/22 1315 04/05/22  1121       Core Components/Risk Factors/Patient Goals Review   Personal Goals Review Weight Management/Obesity;Hypertension;Other Weight Management/Obesity;Hypertension;Other Weight Management/Obesity;Hypertension;Other Weight Management/Obesity;Hypertension;Other    Review Patient was referred to CR with TAVR. She has multiple risk factors for CAD and is participating in the program for risk modification. She has completed 4 sessions. Her current weight is 154.0 maintained from her initial visit. She say Kathyrn Drown, PA with cardiology 10/30. Patient is having Bigeminy during her walk test and when walking on the treadmill. I sent a message to Kathyrn Drown, PA the morning of patient's visit and sent telemetry strips with patient. Office note has not been completed. Patient says she increased her Metoprolol from 25 mg BID to 50 mg BID. Patient's personal goals for the program are to improve her strength and stamina and have less SOB. We will continue to monitor her progress as she works towards meeting these goals. Patient has completed 10 sessions. Her current weight is 154.2 maintained since last 30 day review. She is doing well in the program with consistent attendance and progressions. She continues to have PVC's but no longer in bigeminy on the increased dosage of Metoprolol. When she saw cardiology 10/30 she was complaining of Diplopia and was referred to Neurology. No appointemt has been made. Patient's personal goals for the program continue to be to improve her strength and stamina and have less SOB. We will continue to monitor her progress as she works towards meeting these goals. Patient has completed 16 sessions. Her current weight is 153.5 gaining 0.7 lbs since last 30 day review. She continues to do well in the program with consistent attendance and progressions. She continues to have PVC's but no longer in bigeminy on the increased dosage of Metoprolol. Her blood pressure is at goal. Patient  was evaluated by Neurology for Diplopia 12/22. Neurologist ordered MRI brain and TEE stating symptoms were concerning for embolic plaque in the heart. No test has been scheduled.  Patient's personal goals for the program continue to be to improve her strength and stamina and have less SOB. We will continue to monitor her progress as she works towards meeting these goals. Pt graduated from CR after 23 sessions. Her weight was stable while she was in the program. She had good attendance for the most part, she did miss some time when she had COVID. Her vitals were WNL's. Her PVC's continued, but she was no longer having bigeminy after her Metoprolol dosage was increased. She was able to increase her strength and stamina, and she reports that her SOB with exertion has decreased.    Expected Outcomes Patient will complete the program meeting both personal and program goals. Patient will complete the program meeting both personal and program goals. Patient will complete the program meeting both personal and program goals. Pt will continue to work towards her goals post discharge.             Exercise Goals and Review:  Exercise Goals     Row Name 01/07/22 1354 01/25/22 0821 02/21/22 1447 03/22/22 1148       Exercise Goals  Increase Physical Activity Yes Yes Yes Yes    Intervention Provide advice, education, support and counseling about physical activity/exercise needs.;Develop an individualized exercise prescription for aerobic and resistive training based on initial evaluation findings, risk stratification, comorbidities and participant's personal goals. Provide advice, education, support and counseling about physical activity/exercise needs.;Develop an individualized exercise prescription for aerobic and resistive training based on initial evaluation findings, risk stratification, comorbidities and participant's personal goals. Provide advice, education, support and counseling about physical  activity/exercise needs.;Develop an individualized exercise prescription for aerobic and resistive training based on initial evaluation findings, risk stratification, comorbidities and participant's personal goals. Provide advice, education, support and counseling about physical activity/exercise needs.;Develop an individualized exercise prescription for aerobic and resistive training based on initial evaluation findings, risk stratification, comorbidities and participant's personal goals.    Expected Outcomes Short Term: Attend rehab on a regular basis to increase amount of physical activity.;Long Term: Add in home exercise to make exercise part of routine and to increase amount of physical activity.;Long Term: Exercising regularly at least 3-5 days a week. Short Term: Attend rehab on a regular basis to increase amount of physical activity.;Long Term: Add in home exercise to make exercise part of routine and to increase amount of physical activity.;Long Term: Exercising regularly at least 3-5 days a week. Short Term: Attend rehab on a regular basis to increase amount of physical activity.;Long Term: Add in home exercise to make exercise part of routine and to increase amount of physical activity.;Long Term: Exercising regularly at least 3-5 days a week. Short Term: Attend rehab on a regular basis to increase amount of physical activity.;Long Term: Add in home exercise to make exercise part of routine and to increase amount of physical activity.;Long Term: Exercising regularly at least 3-5 days a week.    Increase Strength and Stamina Yes Yes Yes Yes    Intervention Provide advice, education, support and counseling about physical activity/exercise needs.;Develop an individualized exercise prescription for aerobic and resistive training based on initial evaluation findings, risk stratification, comorbidities and participant's personal goals. Provide advice, education, support and counseling about physical  activity/exercise needs.;Develop an individualized exercise prescription for aerobic and resistive training based on initial evaluation findings, risk stratification, comorbidities and participant's personal goals. Provide advice, education, support and counseling about physical activity/exercise needs.;Develop an individualized exercise prescription for aerobic and resistive training based on initial evaluation findings, risk stratification, comorbidities and participant's personal goals. Provide advice, education, support and counseling about physical activity/exercise needs.;Develop an individualized exercise prescription for aerobic and resistive training based on initial evaluation findings, risk stratification, comorbidities and participant's personal goals.    Expected Outcomes Short Term: Increase workloads from initial exercise prescription for resistance, speed, and METs.;Short Term: Perform resistance training exercises routinely during rehab and add in resistance training at home;Long Term: Improve cardiorespiratory fitness, muscular endurance and strength as measured by increased METs and functional capacity (6MWT) Short Term: Increase workloads from initial exercise prescription for resistance, speed, and METs.;Short Term: Perform resistance training exercises routinely during rehab and add in resistance training at home;Long Term: Improve cardiorespiratory fitness, muscular endurance and strength as measured by increased METs and functional capacity (6MWT) Short Term: Increase workloads from initial exercise prescription for resistance, speed, and METs.;Short Term: Perform resistance training exercises routinely during rehab and add in resistance training at home;Long Term: Improve cardiorespiratory fitness, muscular endurance and strength as measured by increased METs and functional capacity (6MWT) Short Term: Increase workloads from initial exercise prescription for resistance, speed, and  METs.;Short Term:  Perform resistance training exercises routinely during rehab and add in resistance training at home;Long Term: Improve cardiorespiratory fitness, muscular endurance and strength as measured by increased METs and functional capacity (6MWT)    Able to understand and use rate of perceived exertion (RPE) scale Yes Yes Yes Yes    Intervention Provide education and explanation on how to use RPE scale Provide education and explanation on how to use RPE scale Provide education and explanation on how to use RPE scale Provide education and explanation on how to use RPE scale    Expected Outcomes Short Term: Able to use RPE daily in rehab to express subjective intensity level;Long Term:  Able to use RPE to guide intensity level when exercising independently Short Term: Able to use RPE daily in rehab to express subjective intensity level;Long Term:  Able to use RPE to guide intensity level when exercising independently Short Term: Able to use RPE daily in rehab to express subjective intensity level;Long Term:  Able to use RPE to guide intensity level when exercising independently Short Term: Able to use RPE daily in rehab to express subjective intensity level;Long Term:  Able to use RPE to guide intensity level when exercising independently    Knowledge and understanding of Target Heart Rate Range (THRR) Yes Yes Yes Yes    Intervention Provide education and explanation of THRR including how the numbers were predicted and where they are located for reference Provide education and explanation of THRR including how the numbers were predicted and where they are located for reference Provide education and explanation of THRR including how the numbers were predicted and where they are located for reference Provide education and explanation of THRR including how the numbers were predicted and where they are located for reference    Expected Outcomes Short Term: Able to state/look up THRR;Short Term: Able to use  daily as guideline for intensity in rehab;Long Term: Able to use THRR to govern intensity when exercising independently Short Term: Able to state/look up THRR;Short Term: Able to use daily as guideline for intensity in rehab;Long Term: Able to use THRR to govern intensity when exercising independently Short Term: Able to state/look up THRR;Short Term: Able to use daily as guideline for intensity in rehab;Long Term: Able to use THRR to govern intensity when exercising independently Short Term: Able to state/look up THRR;Short Term: Able to use daily as guideline for intensity in rehab;Long Term: Able to use THRR to govern intensity when exercising independently    Able to check pulse independently Yes Yes Yes Yes    Intervention Provide education and demonstration on how to check pulse in carotid and radial arteries.;Review the importance of being able to check your own pulse for safety during independent exercise Provide education and demonstration on how to check pulse in carotid and radial arteries.;Review the importance of being able to check your own pulse for safety during independent exercise Provide education and demonstration on how to check pulse in carotid and radial arteries.;Review the importance of being able to check your own pulse for safety during independent exercise Provide education and demonstration on how to check pulse in carotid and radial arteries.;Review the importance of being able to check your own pulse for safety during independent exercise    Expected Outcomes Short Term: Able to explain why pulse checking is important during independent exercise;Long Term: Able to check pulse independently and accurately Short Term: Able to explain why pulse checking is important during independent exercise;Long Term: Able to check pulse  independently and accurately Short Term: Able to explain why pulse checking is important during independent exercise;Long Term: Able to check pulse independently  and accurately Short Term: Able to explain why pulse checking is important during independent exercise;Long Term: Able to check pulse independently and accurately    Understanding of Exercise Prescription Yes Yes Yes Yes    Intervention Provide education, explanation, and written materials on patient's individual exercise prescription Provide education, explanation, and written materials on patient's individual exercise prescription Provide education, explanation, and written materials on patient's individual exercise prescription Provide education, explanation, and written materials on patient's individual exercise prescription    Expected Outcomes Long Term: Able to explain home exercise prescription to exercise independently;Short Term: Able to explain program exercise prescription Long Term: Able to explain home exercise prescription to exercise independently;Short Term: Able to explain program exercise prescription Long Term: Able to explain home exercise prescription to exercise independently;Short Term: Able to explain program exercise prescription Long Term: Able to explain home exercise prescription to exercise independently;Short Term: Able to explain program exercise prescription             Exercise Goals Re-Evaluation:  Exercise Goals Re-Evaluation     Row Name 01/25/22 0822 02/21/22 1447 03/22/22 1148         Exercise Goal Re-Evaluation   Exercise Goals Review Increase Physical Activity;Increase Strength and Stamina;Able to understand and use rate of perceived exertion (RPE) scale;Knowledge and understanding of Target Heart Rate Range (THRR);Able to check pulse independently;Understanding of Exercise Prescription Increase Physical Activity;Increase Strength and Stamina;Able to understand and use rate of perceived exertion (RPE) scale;Knowledge and understanding of Target Heart Rate Range (THRR);Able to check pulse independently;Understanding of Exercise Prescription Increase Physical  Activity;Able to understand and use rate of perceived exertion (RPE) scale;Increase Strength and Stamina;Knowledge and understanding of Target Heart Rate Range (THRR);Understanding of Exercise Prescription;Able to check pulse independently     Comments Pt has completed 5 sessions of cardiac rehab. She is very motivated during class and is pushing herself. She has already increased her workloads on the treadmill and stepper. She is currently exercising at 2.72 METs on the stepper. Will continue to monitor and progress as able. Pt has completed 14 sessions of cardiac rehab. She continues to push herself and increasing her workload. She is currently not exercising at home but is planning to go [place] on her off days from rehab. She is currently exercising at 2.76 METs on the treadmill. will cotninue to montior and progress as able, Pt has completed 18 sessions of CR. She continues to be motivated during class. She is currently exercising at home by using weights and walking. Pt is currently exercising at 2.76 METs on the treadmill. Will continue to monitor and progress as able.     Expected Outcomes Through exercise at rehab and home, the patient will meet their stated goals. Through exercise at rehab and home, the patient will meet their stated goals. Through exercise at rehab and home, the patient will meet their stated goals.              Nutrition & Weight - Outcomes:  Pre Biometrics - 01/07/22 1355       Pre Biometrics   Height '5\' 4"'$  (1.626 m)    Weight 153 lb 14.1 oz (69.8 kg)    Waist Circumference 36.5 inches    Hip Circumference 39 inches    Waist to Hip Ratio 0.94 %    BMI (Calculated) 26.4    Triceps Skinfold  26 mm    % Body Fat 39.3 %    Grip Strength 22.1 kg    Flexibility 7 in    Single Leg Stand 5.14 seconds             Post Biometrics - 04/01/22 1130        Post  Biometrics   Height '5\' 4"'$  (1.626 m)    Weight 151 lb 14.4 oz (68.9 kg)    Waist Circumference 37 inches     Hip Circumference 38.5 inches    Waist to Hip Ratio 0.96 %    BMI (Calculated) 26.06    Triceps Skinfold 25 mm    % Body Fat 39.1 %    Grip Strength 23.6 kg    Flexibility 7 in    Single Leg Stand 15 seconds             Nutrition:  Nutrition Therapy & Goals - 01/07/22 1323       Personal Nutrition Goals   Comments Patient scored 22 on her diet assessment. Handout explained and provided regarding heathier choices. We offer 2 educational sessions on heart healthy nutrition with handouts and assistance with RD referral if patient is interested.      Intervention Plan   Intervention Nutrition handout(s) given to patient.    Expected Outcomes Short Term Goal: Understand basic principles of dietary content, such as calories, fat, sodium, cholesterol and nutrients.             Nutrition Discharge:  Nutrition Assessments - 04/05/22 1135       MEDFICTS Scores   Pre Score 22    Post Score 15    Score Difference -7             Education Questionnaire Score:  Knowledge Questionnaire Score - 04/05/22 1134       Knowledge Questionnaire Score   Pre Score 22/24    Post Score 24/24             Goals reviewed with patient; copy given to patient. Pt graduated from CR after 23 sessions. She was able to improve her walk test distance by 18.5%, and her MET level was 3.32. She plans to walk at home to continue her exercise.

## 2022-04-20 DIAGNOSIS — X32XXXD Exposure to sunlight, subsequent encounter: Secondary | ICD-10-CM | POA: Diagnosis not present

## 2022-04-20 DIAGNOSIS — L821 Other seborrheic keratosis: Secondary | ICD-10-CM | POA: Diagnosis not present

## 2022-04-20 DIAGNOSIS — L570 Actinic keratosis: Secondary | ICD-10-CM | POA: Diagnosis not present

## 2022-04-21 ENCOUNTER — Ambulatory Visit
Admission: RE | Admit: 2022-04-21 | Discharge: 2022-04-21 | Disposition: A | Discharge status: Home / Self Care | Payer: PPO | Source: Ambulatory Visit | Attending: Neurology | Admitting: Neurology

## 2022-04-21 DIAGNOSIS — H532 Diplopia: Secondary | ICD-10-CM | POA: Diagnosis not present

## 2022-04-21 DIAGNOSIS — I672 Cerebral atherosclerosis: Secondary | ICD-10-CM | POA: Diagnosis not present

## 2022-04-21 MED ORDER — GADOPICLENOL 0.5 MMOL/ML IV SOLN
7.0000 mL | Freq: Once | INTRAVENOUS | Status: AC | PRN
  Administered 2022-04-21: 7 mL via INTRAVENOUS

## 2022-04-25 ENCOUNTER — Telehealth: Payer: Self-pay

## 2022-04-25 NOTE — Telephone Encounter (Signed)
Patient calling to check on order for Cardiologist for a TEE.

## 2022-04-26 NOTE — Progress Notes (Unsigned)
NEUROLOGY FOLLOW UP OFFICE NOTE  MABREY HOWLAND [id number]  Assessment/Plan:   Visual disturbance presenting as vertical diplopia and brief sparkles of light.  Semiology of vertical diplopia not consistent with migraine aura and the other visual symptoms are too brief for migraine aura.  Given the recent TAVR preceding onset of symptoms, concern for cardioembolic source. Atrial fibrillation on Eliquis Status  post TAVR Coronary artery disease   Check MRI of brain and MRA head and neck (needs to be performed in open MRI) Will reach out to cardiology about possible TEE Already on [facility], Eliquis Further recommendations pending results.       Subjective:  Autumn Moyer is a 82 year old right-handed female with a fib on Eliquis, CAD and history of aortic valve stenosis s/p TAVR who follows up for transient diplopia.  UPDATE: MRI of brain and MRA of head and neck on 04/21/2022 personally reviewed were all unremarkable without stroke, LVO or hemodynamically significant stenosis.      HISTORY: Underwent TAVR on 12/14/2021.   On 10/18, she was driving and suddenly couldn't focus.  Didn't last long, just a couple of seconds.  A couple of days later, it occurred again, just a couple of seconds.  The next day, she developed monocular vertical diplopia for 5 minutes.  She had no episodes until this past week, lasting 5 minutes.  Since the surgery, she has had other visual disturbance, described seeing blades of light shooting in her vision lasting a few seconds.  They occur every other day.  No associated slurred speech, facial numbness/weakness, headache, confusion, dizziness, or unilateral numbness or weakness.  She saw her ophthalmologist who didn't find any abnormalities.  2D echo on 01/17/2022 showed LVEF >75% with Grade II diastolic dysfunction, mild MVR, moderate TVR and normal structure of prosthetic aortic valve     She has remote history of ocular migraine that resolved after cataract surgery  82 years old.  Visual aura presenting as scintillating fortification scotoma lasting several hours without headache.    PAST MEDICAL HISTORY: Past Medical History:  Diagnosis Date   Atrial fibrillation, persistent (HCC)    Complication of anesthesia    Hiatal hernia    Hypertension    PONV (postoperative nausea and vomiting)    S/P TAVR (transcatheter aortic valve replacement) 12/14/2021   s/p TAVR with a 52m Edwards S3UR via the TF approach by Dr. TAli Lowe& Dr. WLavonna Monarch  Severe aortic stenosis    Squamous cell carcinoma of skin 01/16/2018   in situ-right knee post (txpbx)   Thyroid disease     MEDICATIONS: Current Outpatient Medications on File Prior to Visit  Medication Sig Dispense Refill   acetaminophen (TYLENOL) 500 MG tablet Take 1,000 mg by mouth in the morning.     apixaban (ELIQUIS) 5 MG TABS tablet Take 1 tablet (5 mg total) by mouth 2 (two) times daily. 180 tablet 1   azithromycin (ZITHROMAX) 500 MG tablet Take 1 tablet by mouth 1 hour before dental cleanings and procedures 12 tablet 6   benazepril-hydrochlorthiazide (LOTENSIN HCT) 20-25 MG tablet Take 1 tablet by mouth daily. 90 tablet 1   carboxymethylcellulose (REFRESH TEARS) 0.5 % SOLN Place 2 drops into both eyes See admin instructions. Instill 2 drops into both eyes scheduled every morning & as needed for dry/irritated eyes.     digoxin (LANOXIN) 0.125 MG tablet Take 1 tablet (0.125 mg total) by mouth daily. 90 tablet 3   furosemide (LASIX) 20 MG tablet Take  20 mg by mouth daily as needed for edema or fluid.     levocetirizine (XYZAL) 5 MG tablet Take 1 tablet (5 mg total) by mouth every evening. 90 tablet 1   levothyroxine (SYNTHROID) 88 MCG tablet Take 1 tablet (88 mcg total) by mouth daily. 90 tablet 3   metoprolol tartrate (LOPRESSOR) 50 MG tablet Take 1 tablet (50 mg total) by mouth 2 (two) times daily. 180 tablet 3   mometasone (ELOCON) 0.1 % cream Apply 1 application topically daily. 45 g 2   Multiple Vitamin  (MULTIVITAMIN WITH MINERALS) TABS tablet Take 1 tablet by mouth every evening.     pantoprazole (PROTONIX) 40 MG tablet Take 1 tablet (40 mg total) by mouth daily. 90 tablet 1   tacrolimus (PROTOPIC) 0.1 % ointment Apply topically in the morning and at bedtime. 30 g 4   No current facility-administered medications on file prior to visit.    ALLERGIES: Allergies  Allergen Reactions   Noroxin [Norfloxacin] Nausea And Vomiting   Augmentin [Amoxicillin-Pot Clavulanate] Other (See Comments)    Gums swelling, lips numb    FAMILY HISTORY: Family History  Problem Relation Age of Onset   Migraines Mother    Hypertension Mother    Hyperlipidemia Mother    Heart failure Mother        Age 75   Hyperlipidemia Father    Hypertension Father    Heart disease Father        died of MI after developoing cancer.   Lung cancer Father    Hypertension Sister    Breast cancer Paternal Aunt    Migraines Maternal Grandfather    Migraines Daughter    Breast cancer Daughter       Objective:  *** General: No acute distress.  Patient appears ***-groomed.   Head:  Normocephalic/atraumatic Eyes:  Fundi examined but not visualized Neck: supple, no paraspinal tenderness, full range of motion Heart:  Regular rate and rhythm Lungs:  Clear to auscultation bilaterally Back: No paraspinal tenderness Neurological Exam: alert and oriented to person, place, and time.  Speech fluent and not dysarthric, language intact.  CN II-XII intact. Bulk and tone normal, muscle strength 5/5 throughout.  Sensation to light touch intact.  Deep tendon reflexes 2+ throughout, toes downgoing.  Finger to nose testing intact.  Gait normal, Romberg negative.   Metta Clines, DO  CC: ***

## 2022-04-26 NOTE — Telephone Encounter (Signed)
Called and informed pt of Dr. Georgie Chard answer. She understood.

## 2022-04-27 ENCOUNTER — Ambulatory Visit: Payer: PPO | Admitting: Neurology

## 2022-04-27 ENCOUNTER — Encounter: Payer: Self-pay | Admitting: Neurology

## 2022-04-27 VITALS — BP 141/84 | HR 83 | Ht 64.0 in | Wt 153.6 lb

## 2022-04-27 DIAGNOSIS — I10 Essential (primary) hypertension: Secondary | ICD-10-CM

## 2022-04-27 DIAGNOSIS — G43109 Migraine with aura, not intractable, without status migrainosus: Secondary | ICD-10-CM

## 2022-04-27 DIAGNOSIS — H532 Diplopia: Secondary | ICD-10-CM | POA: Diagnosis not present

## 2022-04-27 DIAGNOSIS — I4811 Longstanding persistent atrial fibrillation: Secondary | ICD-10-CM

## 2022-04-27 DIAGNOSIS — Z952 Presence of prosthetic heart valve: Secondary | ICD-10-CM | POA: Diagnosis not present

## 2022-05-02 ENCOUNTER — Telehealth: Payer: Self-pay | Admitting: *Deleted

## 2022-05-02 NOTE — Telephone Encounter (Signed)
Follow up scheduled

## 2022-05-02 NOTE — Telephone Encounter (Signed)
-----   Message from Minus Breeding, MD sent at 04/30/2022  6:09 PM EST ----- That is fine.  ----- Message ----- From: Cristopher Estimable, RN Sent: 04/29/2022   3:18 PM EST To: Minus Breeding, MD  She has an April appt and that is the first time in [place], is that ok?  ----- Message ----- From: Minus Breeding, MD Sent: 04/28/2022   4:33 PM EST To: Pieter Partridge, DO; Cristopher Estimable, RN  I will see her back in follow up since I haven't seen her in awhile and then she and I can talk about it.  Thanks.    ----- Message ----- From: Pieter Partridge, DO Sent: 04/25/2022   4:18 PM EST To: Minus Breeding, MD  Hi Dr. Percival Spanish  Ms. Higdon had what sounds like an embolic event back in October - brief vertical diplopia.  It occurred about a month after her TAVR.  I was wondering if a TEE would be beneficial as she is already on Eliquis for a fib.  If you think it would be useful, would you be able to set this up?  Thank you Metta Clines

## 2022-05-23 ENCOUNTER — Other Ambulatory Visit: Payer: Self-pay | Admitting: Cardiology

## 2022-06-14 ENCOUNTER — Ambulatory Visit: Payer: PPO | Admitting: Neurology

## 2022-06-16 ENCOUNTER — Encounter: Payer: Self-pay | Admitting: Family

## 2022-06-16 ENCOUNTER — Ambulatory Visit (INDEPENDENT_AMBULATORY_CARE_PROVIDER_SITE_OTHER): Payer: PPO | Admitting: Family

## 2022-06-16 VITALS — BP 124/75 | HR 56 | Temp 97.0°F | Ht 64.0 in | Wt 152.0 lb

## 2022-06-16 DIAGNOSIS — Z0001 Encounter for general adult medical examination with abnormal findings: Secondary | ICD-10-CM

## 2022-06-16 DIAGNOSIS — L858 Other specified epidermal thickening: Secondary | ICD-10-CM | POA: Diagnosis not present

## 2022-06-16 DIAGNOSIS — I4811 Longstanding persistent atrial fibrillation: Secondary | ICD-10-CM

## 2022-06-16 DIAGNOSIS — Z952 Presence of prosthetic heart valve: Secondary | ICD-10-CM

## 2022-06-16 DIAGNOSIS — D509 Iron deficiency anemia, unspecified: Secondary | ICD-10-CM

## 2022-06-16 DIAGNOSIS — E663 Overweight: Secondary | ICD-10-CM | POA: Diagnosis not present

## 2022-06-16 DIAGNOSIS — K2100 Gastro-esophageal reflux disease with esophagitis, without bleeding: Secondary | ICD-10-CM | POA: Diagnosis not present

## 2022-06-16 DIAGNOSIS — I10 Essential (primary) hypertension: Secondary | ICD-10-CM

## 2022-06-16 DIAGNOSIS — Z0000 Encounter for general adult medical examination without abnormal findings: Secondary | ICD-10-CM

## 2022-06-16 DIAGNOSIS — I4891 Unspecified atrial fibrillation: Secondary | ICD-10-CM | POA: Diagnosis not present

## 2022-06-16 DIAGNOSIS — J309 Allergic rhinitis, unspecified: Secondary | ICD-10-CM

## 2022-06-16 DIAGNOSIS — I350 Nonrheumatic aortic (valve) stenosis: Secondary | ICD-10-CM

## 2022-06-16 DIAGNOSIS — E039 Hypothyroidism, unspecified: Secondary | ICD-10-CM

## 2022-06-16 DIAGNOSIS — R7303 Prediabetes: Secondary | ICD-10-CM | POA: Diagnosis not present

## 2022-06-16 DIAGNOSIS — L089 Local infection of the skin and subcutaneous tissue, unspecified: Secondary | ICD-10-CM

## 2022-06-16 LAB — BAYER DCA HB A1C WAIVED: HB A1C (BAYER DCA - WAIVED): 6.2 % — ABNORMAL HIGH (ref 4.8–5.6)

## 2022-06-16 MED ORDER — APIXABAN 5 MG PO TABS: 5.0000 mg | ORAL_TABLET | Freq: Two times a day (BID) | ORAL | 1 refills | Status: DC

## 2022-06-16 MED ORDER — LEVOTHYROXINE SODIUM 88 MCG PO TABS: 88.0000 ug | ORAL_TABLET | Freq: Every day | ORAL | 3 refills | Status: DC

## 2022-06-16 MED ORDER — FUROSEMIDE 20 MG PO TABS: 20.0000 mg | ORAL_TABLET | Freq: Every day | ORAL | 1 refills | Status: DC | PRN

## 2022-06-16 MED ORDER — DOXYCYCLINE HYCLATE 100 MG PO TABS: 100.0000 mg | ORAL_TABLET | Freq: Two times a day (BID) | ORAL | 0 refills | Status: DC

## 2022-06-16 MED ORDER — LEVOCETIRIZINE DIHYDROCHLORIDE 5 MG PO TABS: 5.0000 mg | ORAL_TABLET | Freq: Every evening | ORAL | 1 refills | Status: DC

## 2022-06-16 NOTE — Patient Instructions (Signed)
Health Maintenance After Age 80 After age 80, you are at a higher risk for certain long-term diseases and infections as well as injuries from falls. Falls are a major cause of broken bones and head injuries in people who are older than age 80. Getting regular preventive care can help to keep you healthy and well. Preventive care includes getting regular testing and making lifestyle changes as recommended by your health care provider. Talk with your health care provider about: Which screenings and tests you should have. A screening is a test that checks for a disease when you have no symptoms. A diet and exercise plan that is right for you. What should I know about screenings and tests to prevent falls? Screening and testing are the best ways to find a health problem early. Early diagnosis and treatment give you the best chance of managing medical conditions that are common after age 80. Certain conditions and lifestyle choices may make you more likely to have a fall. Your health care provider may recommend: Regular vision checks. Poor vision and conditions such as cataracts can make you more likely to have a fall. If you wear glasses, make sure to get your prescription updated if your vision changes. Medicine review. Work with your health care provider to regularly review all of the medicines you are taking, including over-the-counter medicines. Ask your health care provider about any side effects that may make you more likely to have a fall. Tell your health care provider if any medicines that you take make you feel dizzy or sleepy. Strength and balance checks. Your health care provider may recommend certain tests to check your strength and balance while standing, walking, or changing positions. Foot health exam. Foot pain and numbness, as well as not wearing proper footwear, can make you more likely to have a fall. Screenings, including: Osteoporosis screening. Osteoporosis is a condition that causes  the bones to get weaker and break more easily. Blood pressure screening. Blood pressure changes and medicines to control blood pressure can make you feel dizzy. Depression screening. You may be more likely to have a fall if you have a fear of falling, feel depressed, or feel unable to do activities that you used to do. Alcohol use screening. Using too much alcohol can affect your balance and may make you more likely to have a fall. Follow these instructions at home: Lifestyle Do not drink alcohol if: Your health care provider tells you not to drink. If you drink alcohol: Limit how much you have to: 0-1 drink a day for women. 0-2 drinks a day for men. Know how much alcohol is in your drink. In the [place] one drink equals one 12 oz bottle of beer (355 mL), one 5 oz glass of wine (148 mL), or one 1 oz glass of hard liquor (44 mL). Do not use any products that contain nicotine or tobacco. These products include cigarettes, chewing tobacco, and vaping devices, such as e-cigarettes. If you need help quitting, ask your health care provider. Activity  Follow a regular exercise program to stay fit. This will help you maintain your balance. Ask your health care provider what types of exercise are appropriate for you. If you need a cane or walker, use it as recommended by your health care provider. Wear supportive shoes that have nonskid soles. Safety  Remove any tripping hazards, such as rugs, cords, and clutter. Install safety equipment such as grab bars in bathrooms and safety rails on stairs. Keep rooms and walkways   well-lit. General instructions Talk with your health care provider about your risks for falling. Tell your health care provider if: You fall. Be sure to tell your health care provider about all falls, even ones that seem minor. You feel dizzy, tiredness (fatigue), or off-balance. Take over-the-counter and prescription medicines only as told by your health care provider. These include  supplements. Eat a healthy diet and maintain a healthy weight. A healthy diet includes low-fat dairy products, low-fat (lean) meats, and fiber from whole grains, beans, and lots of fruits and vegetables. Stay current with your vaccines. Schedule regular health, dental, and eye exams. Summary Having a healthy lifestyle and getting preventive care can help to protect your health and wellness after age 80. Screening and testing are the best way to find a health problem early and help you avoid having a fall. Early diagnosis and treatment give you the best chance for managing medical conditions that are more common for people who are older than age 80. Falls are a major cause of broken bones and head injuries in people who are older than age 80. Take precautions to prevent a fall at home. Work with your health care provider to learn what changes you can make to improve your health and wellness and to prevent falls. This information is not intended to replace advice given to you by your health care provider. Make sure you discuss any questions you have with your health care provider. Document Revised: 07/27/2020 Document Reviewed: 07/27/2020 Elsevier Patient Education  2023 Elsevier Inc.  

## 2022-06-16 NOTE — Progress Notes (Addendum)
Subjective:    Patient ID: Autumn Moyer, female    DOB: [birth year], 82 y.o.   MRN: [id number]  Chief Complaint  Patient presents with   Medical Management of Chronic Issues   Pt presents to the offic today for CPE and chronic follow up. She is followed by Cardiologists  for A Fib, Aortic stenosis and gets an Echo each year. She is currently on Eliquis 5 mg BID.    She had aortic valve replacement on 12/14/21.  She had several lesions removed on her lower leg 04/20/22. She still has several open ulcers on her right lower leg. Reports mild aching pain of 6 out 10 and mildly erythemas.   She is also complaining of a lesion on her scalp that is getting larger and getting count of her comb.  Hypertension This is a chronic problem. The current episode started more than 1 year ago. The problem has been resolved since onset. The problem is controlled. Associated symptoms include malaise/fatigue, peripheral edema ("slightly") and shortness of breath. Pertinent negatives include no blurred vision. Risk factors for coronary artery disease include dyslipidemia and obesity. The current treatment provides moderate improvement. Identifiable causes of hypertension include a thyroid problem.  Gastroesophageal Reflux She complains of belching, heartburn and a hoarse voice. This is a chronic problem. The current episode started more than 1 year ago. The problem occurs occasionally. Associated symptoms include fatigue. She has tried a PPI for the symptoms. The treatment provided moderate relief.  Thyroid Problem Presents for follow-up visit. Symptoms include fatigue and hoarse voice. Patient reports no constipation or dry skin.  Anemia Presents for follow-up visit. Symptoms include malaise/fatigue.  Diabetes She presents for her follow-up diabetic visit. Diabetes type: prediabetes. Associated symptoms include fatigue. Pertinent negatives for diabetes include no blurred vision. Diabetic complications include  peripheral neuropathy. Risk factors for coronary artery disease include dyslipidemia, diabetes mellitus, hypertension and sedentary lifestyle. (Does not check BS at home)      Review of Systems  Constitutional:  Positive for fatigue and malaise/fatigue.  HENT:  Positive for hoarse voice.   Eyes:  Negative for blurred vision.  Respiratory:  Positive for shortness of breath.   Gastrointestinal:  Positive for heartburn. Negative for constipation.  All other systems reviewed and are negative.  Family History  Problem Relation Age of Onset   Migraines Mother    Hypertension Mother    Hyperlipidemia Mother    Heart failure Mother        Age 29   Hyperlipidemia Father    Hypertension Father    Heart disease Father        died of MI after developoing cancer.   Lung cancer Father    Hypertension Sister    Breast cancer Paternal Aunt    Migraines Maternal Grandfather    Migraines Daughter    Breast cancer Daughter    Social History   Socioeconomic History   Marital status: Married    Spouse name: Danny   Number of children: 2   Years of education: Not on file   Highest education level: Not on file  Occupational History   Not on file  Tobacco Use   Smoking status: Never    Passive exposure: Past   Smokeless tobacco: Never  Vaping Use   Vaping Use: Never used  Substance and Sexual Activity   Alcohol use: No    Alcohol/week: 0.0 standard drinks of alcohol   Drug use: No   Sexual activity: Not Currently  Partners: Male  Other Topics Concern   Not on file  Social History Narrative   Lives with husband.  Married x 56 years in 2022.   2 daughters   5 grand children   1 great grandchild.   Social Determinants of Health   Financial Resource Strain: Low Risk  (03/23/2022)   Overall Financial Resource Strain (CARDIA)    Difficulty of Paying Living Expenses: Not hard at all  Food Insecurity: No Food Insecurity (03/23/2022)   Hunger Vital Sign    Worried About Running Out  of Food in the Last Year: Never true    Ran Out of Food in the Last Year: Never true  Transportation Needs: No Transportation Needs (03/23/2022)   PRAPARE - Hydrologist (Medical): No    Lack of Transportation (Non-Medical): No  Physical Activity: Sufficiently Active (03/23/2022)   Exercise Vital Sign    Days of Exercise per Week: 3 days    Minutes of Exercise per Session: 60 min  Stress: No Stress Concern Present (03/23/2022)   [place]    Feeling of Stress : Not at all  Social Connections: [place] (03/23/2022)   Social Connection and Isolation Panel [NHANES]    Frequency of Communication with Friends and Family: More than three times a week    Frequency of Social Gatherings with Friends and Family: More than three times a week    Attends Religious Services: More than 4 times per year    Active Member of [employer] or Organizations: Yes    Attends Music therapist: More than 4 times per year    Marital Status: Married       Objective:   Physical Exam Vitals reviewed.  Constitutional:      General: She is not in acute distress.    Appearance: She is well-developed. She is obese.  HENT:     Head: Normocephalic and atraumatic.     Right Ear: Tympanic membrane normal.     Left Ear: Tympanic membrane normal.  Eyes:     Pupils: Pupils are equal, round, and reactive to light.  Neck:     Thyroid: No thyromegaly.  Cardiovascular:     Rate and Rhythm: Normal rate and regular rhythm.     Heart sounds: Normal heart sounds. No murmur heard. Pulmonary:     Effort: Pulmonary effort is normal. No respiratory distress.     Breath sounds: Normal breath sounds. No wheezing.  Abdominal:     General: Bowel sounds are normal. There is no distension.     Palpations: Abdomen is soft.     Tenderness: There is no abdominal tenderness.  Musculoskeletal:        General: No tenderness.  Normal range of motion.     Cervical back: Normal range of motion and neck supple.  Skin:    General: Skin is warm and dry.          Comments: Circular lesion approx 1.1X1 cm on lower leg, with erythemas border and mild tenderness  Two horn lesions on scalp.  Neurological:     Mental Status: She is alert and oriented to person, place, and time.     Cranial Nerves: No cranial nerve deficit.     Deep Tendon Reflexes: Reflexes are normal and symmetric.  Psychiatric:        Behavior: Behavior normal.        Thought Content: Thought content normal.  Judgment: Judgment normal.     cutaneous horn and cryotherapy used on scalp. Pt tolerated well.   BP 124/75   Pulse (!) 56   Temp (!) 97 F (36.1 C) (Temporal)   Ht 5\' 4"  (1.626 m)   Wt 152 lb (68.9 kg)   SpO2 96%   BMI 26.09 kg/m      Assessment & Plan:  TAYDE SHAMMAS comes in today with chief complaint of Medical Management of Chronic Issues   Diagnosis and orders addressed:  1. Hypothyroidism, unspecified type - CMP14+EGFR - CBC with Differential/Platelet - TSH - levothyroxine (SYNTHROID) 88 MCG tablet; Take 1 tablet (88 mcg total) by mouth daily.  Dispense: 90 tablet; Refill: 3  2. Essential hypertension, benign - CMP14+EGFR - CBC with Differential/Platelet  3. Gastroesophageal reflux disease with esophagitis, unspecified whether hemorrhage - CMP14+EGFR - CBC with Differential/Platelet  4. Iron deficiency anemia, unspecified iron deficiency anemia type - CMP14+EGFR - CBC with Differential/Platelet  5. Overweight (BMI 25.0-29.9) - CMP14+EGFR - CBC with Differential/Platelet  6. Prediabetes - CMP14+EGFR - CBC with Differential/Platelet - Bayer DCA Hb A1c Waived  7. Severe aortic stenosis - CMP14+EGFR - CBC with Differential/Platelet  8. Longstanding persistent atrial fibrillation (HCC) - CMP14+EGFR - CBC with Differential/Platelet  9. S/P TAVR (transcatheter aortic valve replacement) -  CMP14+EGFR - CBC with Differential/Platelet  10. Annual physical exam - CMP14+EGFR - CBC with Differential/Platelet - Lipid panel - TSH  11. Atrial fibrillation, unspecified type (HCC) - apixaban (ELIQUIS) 5 MG TABS tablet; Take 1 tablet (5 mg total) by mouth 2 (two) times daily.  Dispense: 180 tablet; Refill: 1  12. Allergic rhinitis, unspecified seasonality, unspecified trigger  - levocetirizine (XYZAL) 5 MG tablet; Take 1 tablet (5 mg total) by mouth every evening.  Dispense: 90 tablet; Refill: 1  13. Skin lesion, infected Start doxycyline BID Keep clean and dry Report any fever, increased redness, or tenderness  - doxycycline (VIBRA-TABS) 100 MG tablet; Take 1 tablet (100 mg total) by mouth 2 (two) times daily.  Dispense: 20 tablet; Refill: 0   14. Cutaneous horn Cryotherapy used Avoid picking   Labs pending Health Maintenance reviewed Diet and exercise encouraged  Follow up plan: 6 months   Evelina Dun, FNP

## 2022-06-16 NOTE — Addendum Note (Signed)
Addended by: Evelina Dun A on: 06/16/2022 10:25 AM   Modules accepted: Level of Service

## 2022-06-17 LAB — CBC WITH DIFFERENTIAL/PLATELET
Basophils Absolute: 0.1 10*3/uL (ref 0.0–0.2)
Basos: 1 %
EOS (ABSOLUTE): 0.3 10*3/uL (ref 0.0–0.4)
Eos: 3 %
Hematocrit: 44.4 % (ref 34.0–46.6)
Hemoglobin: 14.7 g/dL (ref 11.1–15.9)
Immature Grans (Abs): 0 10*3/uL (ref 0.0–0.1)
Immature Granulocytes: 0 %
Lymphocytes Absolute: 2.9 10*3/uL (ref 0.7–3.1)
Lymphs: 33 %
MCH: 30.7 pg (ref 26.6–33.0)
MCHC: 33.1 g/dL (ref 31.5–35.7)
MCV: 93 fL (ref 79–97)
Monocytes Absolute: 0.7 10*3/uL (ref 0.1–0.9)
Monocytes: 8 %
Neutrophils Absolute: 4.7 10*3/uL (ref 1.4–7.0)
Neutrophils: 55 %
Platelets: 214 10*3/uL (ref 150–450)
RBC: 4.79 x10E6/uL (ref 3.77–5.28)
RDW: 13.3 % (ref 11.7–15.4)
WBC: 8.7 10*3/uL (ref 3.4–10.8)

## 2022-06-17 LAB — CMP14+EGFR
ALT: 16 IU/L (ref 0–32)
AST: 23 IU/L (ref 0–40)
Albumin/Globulin Ratio: 1.7 (ref 1.2–2.2)
Albumin: 4.7 g/dL (ref 3.8–4.8)
Alkaline Phosphatase: 64 IU/L (ref 44–121)
BUN/Creatinine Ratio: 17 (ref 12–28)
BUN: 20 mg/dL (ref 8–27)
Bilirubin Total: 1.7 mg/dL — ABNORMAL HIGH (ref 0.0–1.2)
CO2: 27 mmol/L (ref 20–29)
Calcium: 10.2 mg/dL (ref 8.7–10.3)
Chloride: 98 mmol/L (ref 96–106)
Creatinine, Ser: 1.19 mg/dL — ABNORMAL HIGH (ref 0.57–1.00)
Globulin, Total: 2.8 g/dL (ref 1.5–4.5)
Glucose: 115 mg/dL — ABNORMAL HIGH (ref 70–99)
Potassium: 4.2 mmol/L (ref 3.5–5.2)
Sodium: 139 mmol/L (ref 134–144)
Total Protein: 7.5 g/dL (ref 6.0–8.5)
eGFR: 47 mL/min/{1.73_m2} — ABNORMAL LOW (ref >59)

## 2022-06-17 LAB — LIPID PANEL
Chol/HDL Ratio: 3.8 ratio (ref 0.0–4.4)
Cholesterol, Total: 165 mg/dL (ref 100–199)
HDL: 43 mg/dL (ref >39)
LDL Chol Calc (NIH): 86 mg/dL (ref 0–99)
Triglycerides: 214 mg/dL — ABNORMAL HIGH (ref 0–149)
VLDL Cholesterol Cal: 36 mg/dL (ref 5–40)

## 2022-06-17 LAB — TSH: TSH: 5.54 u[IU]/mL — ABNORMAL HIGH (ref 0.450–4.500)

## 2022-06-20 ENCOUNTER — Other Ambulatory Visit: Payer: Self-pay | Admitting: Family

## 2022-06-20 MED ORDER — ROSUVASTATIN CALCIUM 10 MG PO TABS: 10.0000 mg | ORAL_TABLET | Freq: Every day | ORAL | 3 refills | Status: DC

## 2022-06-20 MED ORDER — LEVOTHYROXINE SODIUM 100 MCG PO TABS: 100.0000 ug | ORAL_TABLET | Freq: Every day | ORAL | 1 refills | Status: DC

## 2022-06-20 NOTE — Progress Notes (Unsigned)
Cardiology Office Note   Date:  06/22/2022   ID:  Autumn, Moyer 08/20/1940, MRN [id number]  PCP:  Sharion Balloon, FNP  Cardiologist:   Minus Breeding, MD   Chief Complaint  Patient presents with   Aortic Stenosis      History of Present Illness: Autumn Moyer is a 82 y.o. female who presents for evaluation of aortic stenosis and new onset atrial fibrillation. Her AS was moderate in August in 2020.  She had SOB.  Lexiscan Myoview was negative for ischemia.     She had worsening aortic stenosis over time and now is status post TAVR.  She had an [place] 3 via the transfemoral approach.  She does think her breathing is better.  It is not completely resolved and it is not this might be related to a large hiatal hernia she has.  She is in atrial fibrillation but does not really notice this.  This is persistent.  She is not having any new chest pressure, neck or arm discomfort.  She is not having any weight gain or edema.  She has been active.  She might get a little short of breath climbing up a hill.   Past Medical History:  Diagnosis Date   Atrial fibrillation, persistent    Complication of anesthesia    Hiatal hernia    Hypertension    PONV (postoperative nausea and vomiting)    S/P TAVR (transcatheter aortic valve replacement) 12/14/2021   s/p TAVR with a 91mm Edwards S3UR via the TF approach by Dr. Ali Lowe & Dr. Lavonna Monarch   Severe aortic stenosis    Squamous cell carcinoma of skin 01/16/2018   in situ-right knee post (txpbx)   Thyroid disease     Past Surgical History:  Procedure Laterality Date   ABDOMINAL AORTOGRAM N/A 09/22/2021   Procedure: ABDOMINAL AORTOGRAM;  Surgeon: Early Osmond, MD;  Location: [facility];  Service: Cardiovascular;  Laterality: N/A;   CARDIAC CATHETERIZATION     EYE SURGERY     INTRAOPERATIVE TRANSTHORACIC ECHOCARDIOGRAM N/A 12/14/2021   Procedure: INTRAOPERATIVE TRANSTHORACIC ECHOCARDIOGRAM;  Surgeon: Early Osmond,  MD;  Location: [place] [facility];  Service: Open Heart Surgery;  Laterality: N/A;   MULTIPLE EXTRACTIONS WITH ALVEOLOPLASTY N/A 12/02/2021   Procedure: MULTIPLE EXTRACTION WITH ALVEOLOPLASTY;  Surgeon: Charlaine Dalton, DMD;  Location: [place];  Service: Dentistry;  Laterality: N/A;   RIGHT HEART CATH AND CORONARY/GRAFT ANGIOGRAPHY N/A 09/22/2021   Procedure: RIGHT HEART CATH AND CORONARY/GRAFT ANGIOGRAPHY;  Surgeon: Early Osmond, MD;  Location: [facility];  Service: Cardiovascular;  Laterality: N/A;   TOE SURGERY     TRANSCATHETER AORTIC VALVE REPLACEMENT, TRANSFEMORAL N/A 12/14/2021   Procedure: Transcatheter Aortic Valve Replacement, Transfemoral using Edwards 23 MM SAPIEN 3 Ultra;  Surgeon: Early Osmond, MD;  Location: [facility];  Service: Open Heart Surgery;  Laterality: N/A;  Transfemoral approach   TUBAL LIGATION       Current Outpatient Medications  Medication Sig Dispense Refill   acetaminophen (TYLENOL) 500 MG tablet Take 1,000 mg by mouth in the morning.     apixaban (ELIQUIS) 5 MG TABS tablet Take 1 tablet (5 mg total) by mouth 2 (two) times daily. 180 tablet 1   benazepril-hydrochlorthiazide (LOTENSIN HCT) 20-25 MG tablet Take 1 tablet by mouth daily. 90 tablet 1   carboxymethylcellulose (REFRESH TEARS) 0.5 % SOLN Place 2 drops into both eyes See admin instructions. Instill 2 drops into both eyes  scheduled every morning & as needed for dry/irritated eyes.     digoxin (LANOXIN) 0.125 MG tablet Take 1 tablet by mouth once daily 90 tablet 3   doxycycline (VIBRA-TABS) 100 MG tablet Take 1 tablet (100 mg total) by mouth 2 (two) times daily. 20 tablet 0   furosemide (LASIX) 20 MG tablet Take 1 tablet (20 mg total) by mouth daily as needed for edema or fluid. 90 tablet 1   levocetirizine (XYZAL) 5 MG tablet Take 1 tablet (5 mg total) by mouth every evening. 90 tablet 1   levothyroxine (SYNTHROID) 100 MCG tablet Take 1 tablet (100 mcg total) by mouth daily. 90 tablet 1   metoprolol tartrate  (LOPRESSOR) 50 MG tablet Take 1 tablet (50 mg total) by mouth 2 (two) times daily. 180 tablet 3   mometasone (ELOCON) 0.1 % cream Apply 1 application topically daily. 45 g 2   Multiple Vitamin (MULTIVITAMIN WITH MINERALS) TABS tablet Take 1 tablet by mouth every evening.     pantoprazole (PROTONIX) 40 MG tablet Take 1 tablet (40 mg total) by mouth daily. 90 tablet 1   tacrolimus (PROTOPIC) 0.1 % ointment Apply topically in the morning and at bedtime. 30 g 4   rosuvastatin (CRESTOR) 10 MG tablet Take 1 tablet (10 mg total) by mouth daily. (Patient not taking: Reported on 06/22/2022) 90 tablet 3   No current facility-administered medications for this visit.    Allergies:   Noroxin [norfloxacin] and Augmentin [amoxicillin-pot clavulanate]    ROS:  Please see the history of present illness.   Otherwise, review of systems are positive for none.   All other systems are reviewed and negative.    PHYSICAL EXAM: VS:  BP 104/70   Pulse 68   Ht 5\' 4"  (1.626 m)   Wt 151 lb (68.5 kg)   BMI 25.92 kg/m  , BMI Body mass index is 25.92 kg/m. GENERAL:  Well appearing NECK:  No jugular venous distention, waveform within normal limits, carotid upstroke brisk and symmetric, no bruits, no thyromegaly LUNGS:  Clear to auscultation bilaterally CHEST:  Unremarkable HEART:  PMI not displaced or sustained,S1 and S2 within normal limits, no S3,  no clicks, no rubs, soft apical systolic murmurs, irregular ABD:  Flat, positive bowel sounds normal in frequency in pitch, no bruits, no rebound, no guarding, no midline pulsatile mass, no hepatomegaly, no splenomegaly EXT:  2 plus pulses throughout, no edema, no cyanosis no clubbing   EKG:  EKG is not ordered today.    Recent Labs: 12/15/2021: Magnesium 1.7 06/16/2022: ALT 16; BUN 20; Creatinine, Ser 1.19; Hemoglobin 14.7; Platelets 214; Potassium 4.2; Sodium 139; TSH 5.540    Lipid Panel    Component Value Date/Time   CHOL 165 06/16/2022 1007   TRIG 214 (H)  06/16/2022 1007   HDL 43 06/16/2022 1007   CHOLHDL 3.8 06/16/2022 1007   LDLCALC 86 06/16/2022 1007      Wt Readings from Last 3 Encounters:  06/22/22 151 lb (68.5 kg)  06/16/22 152 lb (68.9 kg)  04/27/22 153 lb 9.6 oz (69.7 kg)      Other studies Reviewed: Additional studies/ records that were reviewed today include: Labs Review of the above records demonstrates:    See elsewhere   ASSESSMENT AND PLAN:  AS:   She is status post TAVR.  She is doing well.  She has follow-up in September.  No change in therapy.  Anemia   HTN: Her blood pressure is at target.  No change  in therapy.  ELEVATED TRIGLYCERIDES:  Dietary management should focus on attaining and maintaining a healthy weight and reduction of intake of simple carbohydrates, (<6 percent calories of added sugar and 30 to 35 percent calories of total fat.)  Dietary fat is not a primary source for liver TG, and higher-fat diets do not raise fasting plasma TG levels in most people. Nevertheless, a change in the types of fat (ie, reducing saturated versus poly- and monounsaturated fats) is recommended . I suggest increased consumption of fish that contain high amounts of omega-3 fatty acids (baked not fried.)  She was given Crestor but she really wants to try diet.  SOB:   I have asked her to discuss this with Dr. Cyndia Bent.  I wonder if she might be a candidate for robotic reduction of her hiatal hernia.  ATRIAL FIB:     Ms. KATRICE BUDAY has a CHA2DS2 - VASc score of 4.   Given the size of the left atrium and the asymptomatic nature we are not going to try rhythm management.  She does well with rate control and anticoagulation.  She is on digoxin and I would like to check a digoxin level.  Current medicines are reviewed at length with the patient today.  The patient does not have concerns regarding medicines.  The following changes have been made: None   Disposition:   Follow up me in one year.   Signed, Minus Breeding, MD   06/22/2022 12:16 PM    [place]

## 2022-06-22 ENCOUNTER — Encounter: Payer: Self-pay | Admitting: Cardiology

## 2022-06-22 ENCOUNTER — Other Ambulatory Visit: Payer: Self-pay | Admitting: *Deleted

## 2022-06-22 ENCOUNTER — Ambulatory Visit (INDEPENDENT_AMBULATORY_CARE_PROVIDER_SITE_OTHER): Payer: PPO | Admitting: Cardiology

## 2022-06-22 VITALS — BP 104/70 | HR 68 | Ht 64.0 in | Wt 151.0 lb

## 2022-06-22 DIAGNOSIS — Z79899 Other long term (current) drug therapy: Secondary | ICD-10-CM

## 2022-06-22 DIAGNOSIS — I10 Essential (primary) hypertension: Secondary | ICD-10-CM | POA: Diagnosis not present

## 2022-06-22 DIAGNOSIS — R0602 Shortness of breath: Secondary | ICD-10-CM | POA: Diagnosis not present

## 2022-06-22 DIAGNOSIS — I350 Nonrheumatic aortic (valve) stenosis: Secondary | ICD-10-CM

## 2022-06-22 DIAGNOSIS — I4819 Other persistent atrial fibrillation: Secondary | ICD-10-CM | POA: Diagnosis not present

## 2022-06-22 NOTE — Patient Instructions (Signed)
Medication Instructions:  The current medical regimen is effective;  continue present plan and medications.  *If you need a refill on your cardiac medications before your next appointment, please call your pharmacy*  Lab Work: Please have blood work at [facility] (Digoxin level)  If you have labs (blood work) drawn today and your tests are completely normal, you will receive your results only by: MyChart Message (if you have MyChart) OR A paper copy in the mail If you have any lab test that is abnormal or we need to change your treatment, we will call you to review the results.   Follow-Up: At [facility], you and your health needs are our priority.  As part of our continuing mission to provide you with exceptional heart care, we have created designated Provider Care Teams.  These Care Teams include your primary Cardiologist (physician) and Advanced Practice Providers (APPs -  Physician Assistants and Nurse Practitioners) who all work together to provide you with the care you need, when you need it.  We recommend signing up for the patient portal called "MyChart".  Sign up information is provided on this After Visit Summary.  MyChart is used to connect with patients for Virtual Visits (Telemedicine).  Patients are able to view lab/test results, encounter notes, upcoming appointments, etc.  Non-urgent messages can be sent to your provider as well.   To learn more about what you can do with MyChart, go to [email or web address].    Your next appointment:   1 year(s)  Provider:   Minus Breeding, MD

## 2022-07-06 ENCOUNTER — Other Ambulatory Visit: Payer: PPO

## 2022-07-06 DIAGNOSIS — Z79899 Other long term (current) drug therapy: Secondary | ICD-10-CM | POA: Diagnosis not present

## 2022-07-06 DIAGNOSIS — I4819 Other persistent atrial fibrillation: Secondary | ICD-10-CM | POA: Diagnosis not present

## 2022-07-08 LAB — DIGOXIN LEVEL: Digoxin, Serum: 0.8 ng/mL (ref 0.5–0.9)

## 2022-07-08 IMAGING — MG MM DIGITAL SCREENING BILAT W/ TOMO AND CAD
8 series · 8 of 24 positions shown · non-contrast
Comparison: Previous exam(s).

CLINICAL DATA: Screening.

EXAM:
DIGITAL SCREENING BILATERAL MAMMOGRAM WITH TOMOSYNTHESIS AND CAD
TECHNIQUE: Bilateral screening digital craniocaudal and mediolateral oblique
mammograms were obtained. Bilateral screening digital breast
tomosynthesis was performed. The images were evaluated with
computer-aided detection.

[L CC synth-2D]
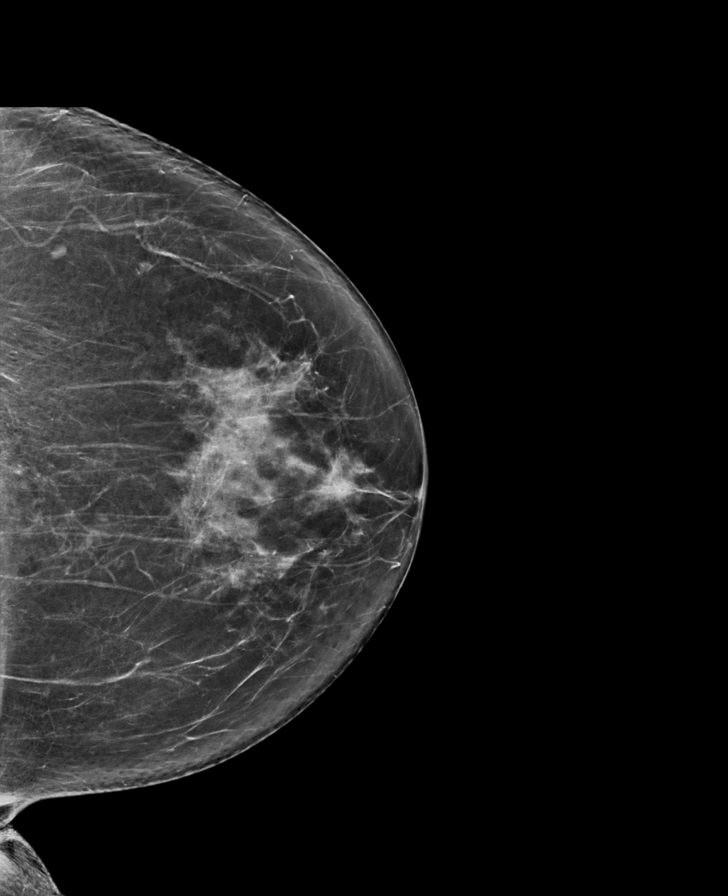

[R CC synth-2D]
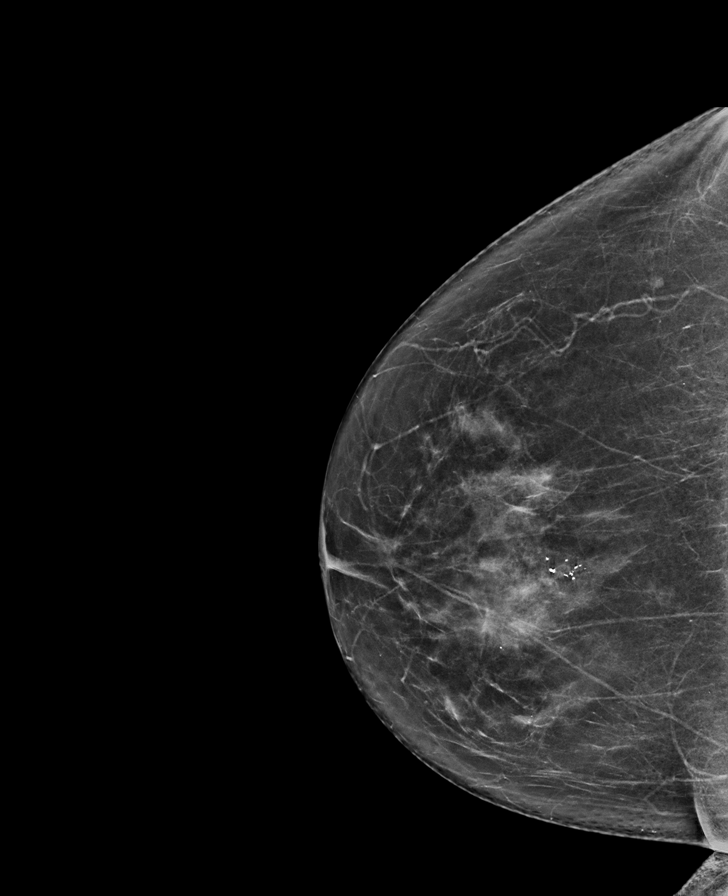

[R MLO synth-2D]
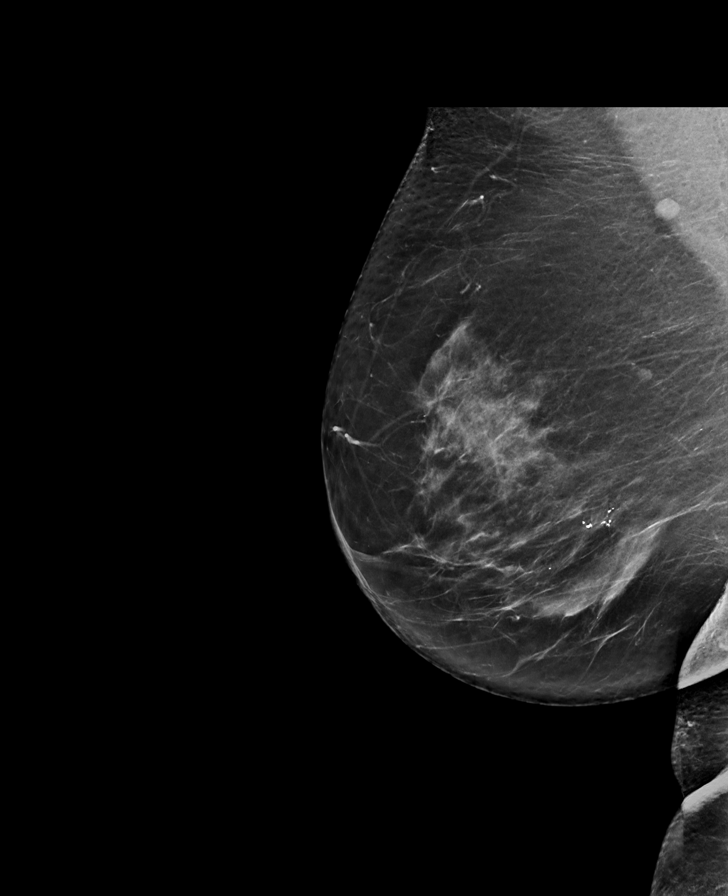

[L MLO synth-2D]
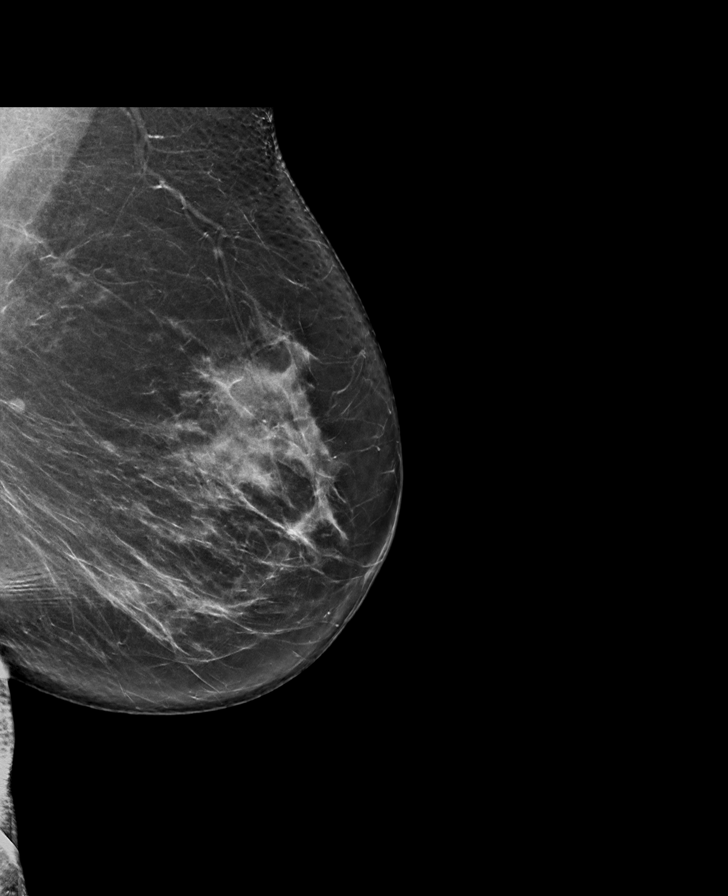

[R MLO tomo · tomo slice 41/82.0]
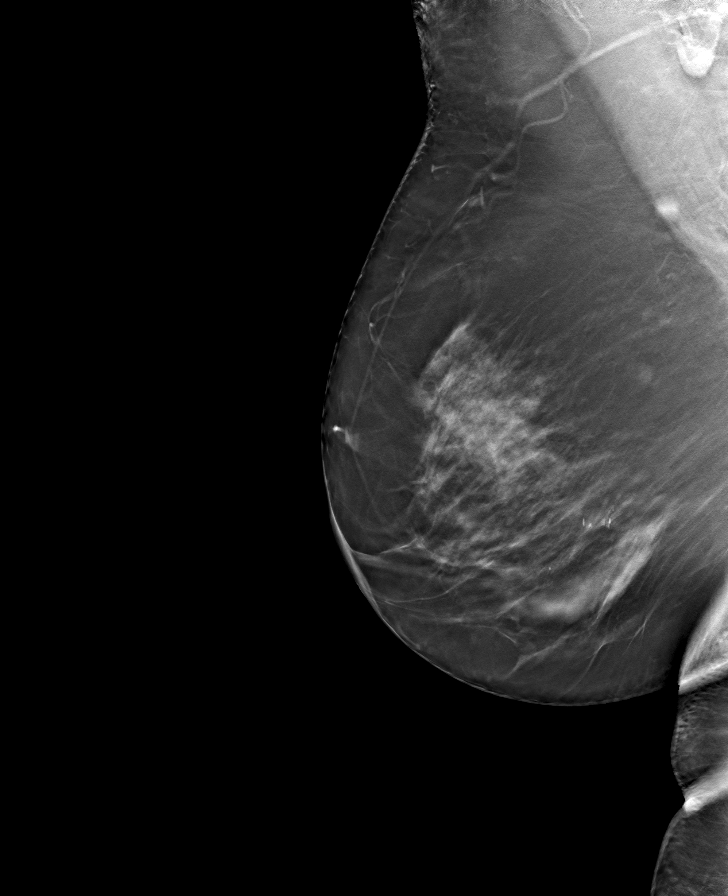

[L MLO tomo · tomo slice 41/80.0]
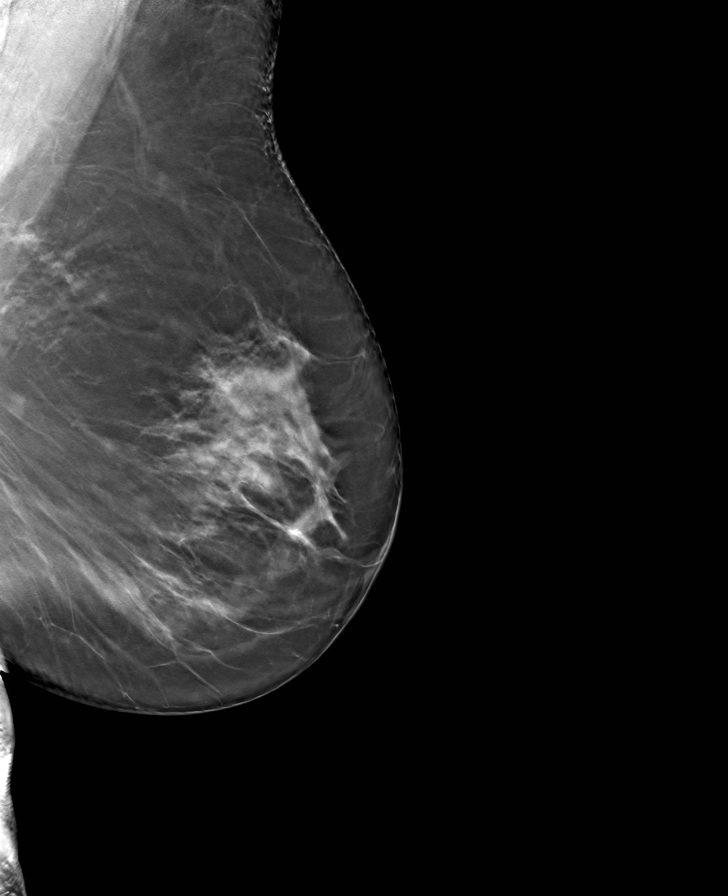

[R CC tomo · tomo slice 35/70.0]
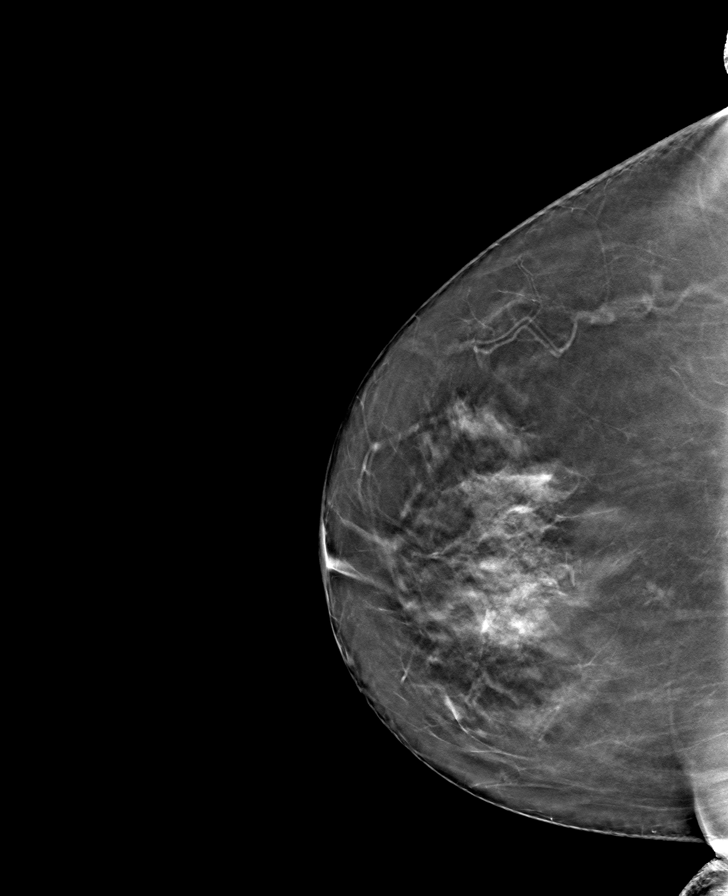

[L CC tomo · tomo slice 37/74.0]
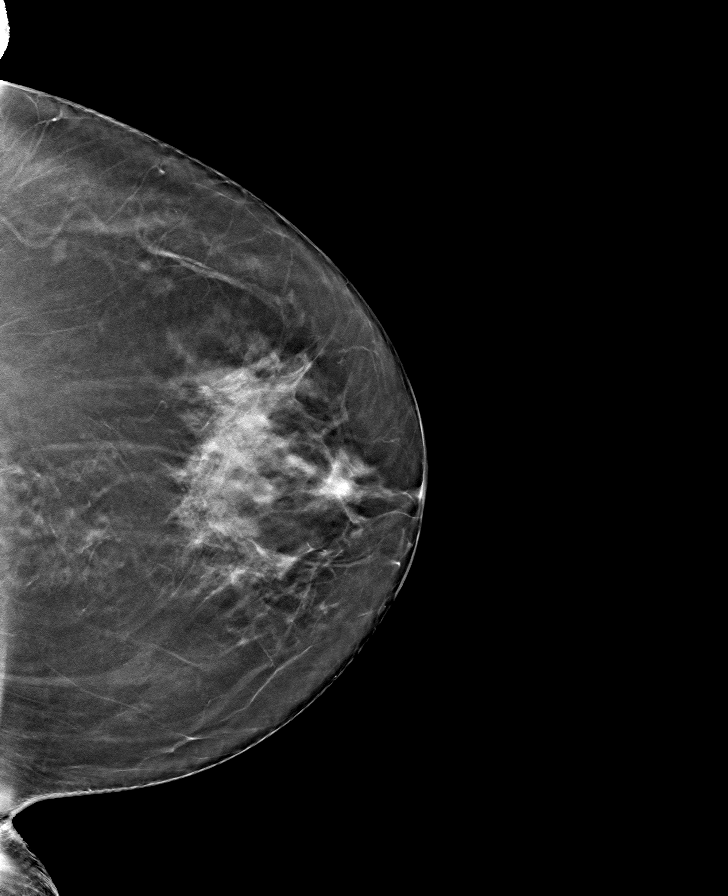

[8 of 24 positions shown; findings below may reference images not displayed]

ACR Breast Density Category b: There are scattered areas of
fibroglandular density.
FINDINGS: There are no findings suspicious for malignancy.
IMPRESSION: No mammographic evidence of malignancy. A result letter of this
screening mammogram will be mailed directly to the patient.

RECOMMENDATION:
Screening mammogram in one year. (Code:51-O-LD2)

BI-RADS CATEGORY  1: Negative.

## 2022-08-19 ENCOUNTER — Encounter: Payer: Self-pay | Admitting: Family

## 2022-08-19 ENCOUNTER — Ambulatory Visit (INDEPENDENT_AMBULATORY_CARE_PROVIDER_SITE_OTHER): Payer: PPO | Admitting: Family

## 2022-08-19 VITALS — BP 112/71 | HR 86 | Temp 97.6°F | Ht 64.0 in | Wt 145.4 lb

## 2022-08-19 DIAGNOSIS — I10 Essential (primary) hypertension: Secondary | ICD-10-CM

## 2022-08-19 DIAGNOSIS — E039 Hypothyroidism, unspecified: Secondary | ICD-10-CM | POA: Diagnosis not present

## 2022-08-19 NOTE — Progress Notes (Signed)
Subjective:    Patient ID: Autumn Moyer, female    DOB: 08-06-1940, 82 y.o.   MRN: [id number]  Chief Complaint  Patient presents with   Hypothyroidism    2 mth rck    Pt presents to the office today to recheck thyroid. She was seen on 06/16/22 and her TSH was elevated and her levothyroxine was increased to 100  mcg from 88 mcg.  Thyroid Problem Presents for follow-up visit. Symptoms include fatigue. Patient reports no anxiety, constipation, diaphoresis, diarrhea, dry skin or hoarse voice. The symptoms have been stable.  Hypertension This is a chronic problem. The current episode started more than 1 year ago. The problem has been resolved since onset. The problem is controlled. Associated symptoms include malaise/fatigue. Pertinent negatives include no peripheral edema or shortness of breath. The current treatment provides moderate improvement. Identifiable causes of hypertension include a thyroid problem.      Review of Systems  Constitutional:  Positive for fatigue and malaise/fatigue. Negative for diaphoresis.  HENT:  Negative for hoarse voice.   Respiratory:  Negative for shortness of breath.   Gastrointestinal:  Negative for constipation and diarrhea.  Psychiatric/Behavioral:  The patient is not nervous/anxious.   All other systems reviewed and are negative.      Objective:   Physical Exam Vitals reviewed.  Constitutional:      General: She is not in acute distress.    Appearance: She is well-developed.  HENT:     Head: Normocephalic and atraumatic.     Right Ear: Tympanic membrane normal.     Left Ear: Tympanic membrane normal.  Eyes:     Pupils: Pupils are equal, round, and reactive to light.  Neck:     Thyroid: No thyromegaly.  Cardiovascular:     Rate and Rhythm: Normal rate and regular rhythm.     Heart sounds: Normal heart sounds. No murmur heard. Pulmonary:     Effort: Pulmonary effort is normal. No respiratory distress.     Breath sounds: Normal breath  sounds. No wheezing.  Abdominal:     General: Bowel sounds are normal. There is no distension.     Palpations: Abdomen is soft.     Tenderness: There is no abdominal tenderness.  Musculoskeletal:        General: No tenderness. Normal range of motion.     Cervical back: Normal range of motion and neck supple.  Skin:    General: Skin is warm and dry.  Neurological:     Mental Status: She is alert and oriented to person, place, and time.     Cranial Nerves: No cranial nerve deficit.     Deep Tendon Reflexes: Reflexes are normal and symmetric.  Psychiatric:        Behavior: Behavior normal.        Thought Content: Thought content normal.        Judgment: Judgment normal.       BP 112/71   Pulse 86   Temp 97.6 F (36.4 C) (Temporal)   Ht 5\' 4"  (1.626 m)   Wt 145 lb 6.6 oz (66 kg)   SpO2 95%   BMI 24.96 kg/m      Assessment & Plan:   Autumn Moyer comes in today with chief complaint of Hypothyroidism (2 mth rck )   Diagnosis and orders addressed:  1. Hypothyroidism, unspecified type - TSH  2. Essential hypertension, benign   Labs pending Health Maintenance reviewed Diet and exercise encouraged  Follow up  plan: Keep chronic follow up   Jannifer Rodney, FNP

## 2022-08-19 NOTE — Patient Instructions (Signed)

## 2022-08-20 LAB — TSH: TSH: 0.641 u[IU]/mL (ref 0.450–4.500)

## 2022-10-31 ENCOUNTER — Other Ambulatory Visit: Payer: Self-pay | Admitting: Family

## 2022-10-31 DIAGNOSIS — Z1231 Encounter for screening mammogram for malignant neoplasm of breast: Secondary | ICD-10-CM

## 2022-10-31 NOTE — Progress Notes (Unsigned)
NEUROLOGY FOLLOW UP OFFICE NOTE  Autumn Moyer [id number]  Assessment/Plan:   Ocular migraines.  Episodes of "blades of lights" are clearly migraine.  As she has since had recurrent episodes of diplopia, this makes a cerebrovascular event unlikely and more likely migraine.  For sake of completeness, will check myasthenia gravis panel Atrial fibrillation on Eliquis Status  post TAVR Coronary artery disease   Check myasthenia gravis panel (AchR antibodies, anti-MUSK) On Eliqus Follow up 6 months.       Subjective:  Autumn Moyer is a 82 year old right-handed female with a fib on Eliquis, CAD and history of aortic valve stenosis s/p TAVR who follows up for transient diplopia.  UPDATE: Has continued to have recurrent episodes of seeing lights or double vision last a few minutes.  Usually once a week.  A month ago, they were occurring more frequent (about 3 times a week) but have leveled back to once a week.  Moving her eyes a lot will trigger it.        HISTORY: Underwent TAVR on 12/14/2021.   On 10/18, she was driving and suddenly couldn't focus.  Didn't last long, just a couple of seconds.  A couple of days later, it occurred again, just a couple of seconds.  The next day, she developed monocular vertical diplopia for 5 minutes.  She had no episodes until this past week, lasting 5 minutes.  Since the surgery, she has had other visual disturbance, described seeing blades of light shooting in her vision lasting a few seconds.  They occur every other day.  No associated slurred speech, facial numbness/weakness, headache, confusion, dizziness, or unilateral numbness or weakness.  She saw her ophthalmologist who didn't find any abnormalities.  2D echo on 01/17/2022 showed LVEF >75% with Grade II diastolic dysfunction, mild MVR, moderate TVR and normal structure of prosthetic aortic valve.  MRI of brain and MRA of head and neck on 04/21/2022 were all unremarkable without stroke, LVO or  hemodynamically significant stenosis.       She has remote history of ocular migraine that resolved after cataract surgery 82 years old.  Visual aura presenting as scintillating fortification scotoma lasting several hours without headache.  She also has remote history of classic migraines as well.  Not for decades.  Then in January 2024, she was at the grocery store when she was looking at the grocery list and it was jumbled (words were jumbled and moving around.  It lasted longer, about 5 minutes.  She still has brief episodes of "blades of light" in her vision lasting a few seconds.  However, the size of the "blades" were smaller.    PAST MEDICAL HISTORY: Past Medical History:  Diagnosis Date   Atrial fibrillation, persistent (HCC)    Complication of anesthesia    Hiatal hernia    Hypertension    PONV (postoperative nausea and vomiting)    S/P TAVR (transcatheter aortic valve replacement) 12/14/2021   s/p TAVR with a 23mm Edwards S3UR via the TF approach by Dr. Lynnette Caffey & Dr. Leafy Ro   Severe aortic stenosis    Squamous cell carcinoma of skin 01/16/2018   in situ-right knee post (txpbx)   Thyroid disease     MEDICATIONS: Current Outpatient Medications on File Prior to Visit  Medication Sig Dispense Refill   acetaminophen (TYLENOL) 500 MG tablet Take 1,000 mg by mouth in the morning.     apixaban (ELIQUIS) 5 MG TABS tablet Take 1 tablet (5 mg total)  by mouth 2 (two) times daily. 180 tablet 1   benazepril-hydrochlorthiazide (LOTENSIN HCT) 20-25 MG tablet Take 1 tablet by mouth daily. 90 tablet 1   carboxymethylcellulose (REFRESH TEARS) 0.5 % SOLN Place 2 drops into both eyes See admin instructions. Instill 2 drops into both eyes scheduled every morning & as needed for dry/irritated eyes.     digoxin (LANOXIN) 0.125 MG tablet Take 1 tablet by mouth once daily 90 tablet 3   furosemide (LASIX) 20 MG tablet Take 1 tablet (20 mg total) by mouth daily as needed for edema or fluid. 90 tablet 1    levocetirizine (XYZAL) 5 MG tablet Take 1 tablet (5 mg total) by mouth every evening. 90 tablet 1   levothyroxine (SYNTHROID) 100 MCG tablet Take 1 tablet (100 mcg total) by mouth daily. 90 tablet 1   metoprolol tartrate (LOPRESSOR) 50 MG tablet Take 1 tablet (50 mg total) by mouth 2 (two) times daily. 180 tablet 3   mometasone (ELOCON) 0.1 % cream Apply 1 application topically daily. 45 g 2   Multiple Vitamin (MULTIVITAMIN WITH MINERALS) TABS tablet Take 1 tablet by mouth every evening.     pantoprazole (PROTONIX) 40 MG tablet Take 1 tablet (40 mg total) by mouth daily. 90 tablet 1   rosuvastatin (CRESTOR) 10 MG tablet Take 1 tablet (10 mg total) by mouth daily. 90 tablet 3   tacrolimus (PROTOPIC) 0.1 % ointment Apply topically in the morning and at bedtime. 30 g 4   No current facility-administered medications on file prior to visit.    ALLERGIES: Allergies  Allergen Reactions   Noroxin [Norfloxacin] Nausea And Vomiting   Augmentin [Amoxicillin-Pot Clavulanate] Other (See Comments)    Gums swelling, lips numb    FAMILY HISTORY: Family History  Problem Relation Age of Onset   Migraines Mother    Hypertension Mother    Hyperlipidemia Mother    Heart failure Mother        Age 76   Hyperlipidemia Father    Hypertension Father    Heart disease Father        died of MI after developoing cancer.   Lung cancer Father    Hypertension Sister    Breast cancer Paternal Aunt    Migraines Maternal Grandfather    Migraines Daughter    Breast cancer Daughter       Objective:  Blood pressure 97/64, pulse 64, height 5\' 4"  (1.626 m), weight 146 lb 6.4 oz (66.4 kg), SpO2 93%. General: No acute distress.  Patient appears well-groomed.      Shon Millet, DO  CC: Autumn Rodney, FNP

## 2022-11-01 ENCOUNTER — Encounter: Payer: Self-pay | Admitting: Neurology

## 2022-11-01 ENCOUNTER — Other Ambulatory Visit (INDEPENDENT_AMBULATORY_CARE_PROVIDER_SITE_OTHER): Payer: PPO

## 2022-11-01 ENCOUNTER — Ambulatory Visit: Payer: PPO | Admitting: Neurology

## 2022-11-01 VITALS — BP 97/64 | HR 64 | Ht 64.0 in | Wt 146.4 lb

## 2022-11-01 DIAGNOSIS — G43109 Migraine with aura, not intractable, without status migrainosus: Secondary | ICD-10-CM

## 2022-11-01 DIAGNOSIS — H532 Diplopia: Secondary | ICD-10-CM | POA: Diagnosis not present

## 2022-11-01 NOTE — Patient Instructions (Signed)
I think these episodes are migraines.  For sake of completeness, will check Myasthenia gravis panel with anti-MUSK.

## 2022-11-08 ENCOUNTER — Encounter: Payer: Self-pay | Admitting: Family

## 2022-11-08 ENCOUNTER — Ambulatory Visit (INDEPENDENT_AMBULATORY_CARE_PROVIDER_SITE_OTHER): Payer: PPO | Admitting: Family

## 2022-11-08 VITALS — BP 109/69 | HR 84 | Temp 98.7°F | Ht 64.0 in | Wt 145.4 lb

## 2022-11-08 DIAGNOSIS — M948X9 Other specified disorders of cartilage, unspecified sites: Secondary | ICD-10-CM

## 2022-11-08 MED ORDER — CIPROFLOXACIN HCL 500 MG PO TABS: 500.0000 mg | ORAL_TABLET | Freq: Two times a day (BID) | ORAL | 0 refills | Status: DC

## 2022-11-08 NOTE — Patient Instructions (Signed)
Perichondritis, Adult  Perichondritis is an infection of the outer ear (pinna). The pinna is shaped to direct sound into the inner ear. It gets its shape from a firm, elastic tissue (cartilage). The cartilage is protected by another tissue called the perichondrium, which is just under the skin of the pinna. If you injure your ear, bacteria can get under the skin and infect the perichondrium. A collection of pus (abscess) may form. This may separate the perichondrium and its blood supply from the cartilage of the ear. A severe or untreated infection can lead to a type of ear deformity known as cauliflower ear. What are the causes? This condition is caused by bacteria that enter the perichondrium when the ear is injured. The injury may be from an upper ear piercing that goes through cartilage instead of the earlobe. Other causes include: Injury to the outer ear (trauma). Using needles in or on the ear to treat problems (acupuncture). Insect bite. Ear pimple or boil. Ear surgery. Ear burn. What increases the risk? You are more likely to develop this condition if: You have diabetes. Your body has a weak defense system (immune system). You have piercings through the cartilage of the ear. What are the signs or symptoms? Symptoms of this condition include: Dull pain that gets worse. Ear swelling that gets worse. Skin over the ear that is warm, tender, and red. Fever. Swollen lymph nodes near the ear. Pus draining from the ear. How is this diagnosed? This condition may be diagnosed based on: Your signs and symptoms, especially if you had a recent ear injury. A physical exam. The health care provider will check the ear for swelling, redness, or drainage. Lab tests. This may include a test of a sample of pus to see which bacteria is causing the infection (culture). How is this treated? This condition is usually treated with antibiotics that work well against most bacteria that cause this  condition. Your antibiotics will be changed if culture results show that your symptoms are caused by a different kind of bacteria. Other treatments may include: Using warm or cold compresses to help with pain and discomfort. Taking pain medicine. Removing pus and dead cartilage with a surgical knife (incision and drainage) or a needle (aspiration). Injecting antibiotics and medicines (corticosteroids) into the affected area. If your condition is severe, you may be given antibiotics through an IV. Follow these instructions at home: If you had incision and drainage done: Follow instructions from your health care provider about how to take care of your incision. Make sure you: Wash your hands with soap and water for at least 20 seconds before and after you change your bandage (dressing). If soap and water are not available, use hand sanitizer. Change your dressing as told by your health care provider. Leave stitches (sutures), skin glue, or adhesive strips in place. These skin closures may need to stay in place for 2 weeks or longer. If adhesive strip edges start to loosen and curl up, you may trim the loose edges. Do not remove adhesive strips completely unless your health care provider tells you to do that. Check your incision area every day for signs of infection. Check for: More redness, swelling, or pain. Fluid or blood. Warmth. Pus or a bad smell. Do not take baths, swim, or use a hot tub until your health care provider approves. Ask your health care provider if you may take showers. You may only be allowed to take sponge baths. Managing pain and swelling If directed,  put ice on the affected area. To do this: Put ice in a plastic bag. Place a towel between your skin and the bag. Leave the ice on for 20 minutes, 2-3 times a day. If your skin turns bright red, remove the ice right away to prevent skin damage. The risk of skin damage is higher if you cannot feel pain, heat, or cold. If  directed, apply heat to the affected area as often as told by your health care provider. Use the heat source that your health care provider recommends, such as a moist heat pack or a heating pad. Place a towel between your skin and the heat source. Leave the heat on for 20-30 minutes. If your skin turns bright red, remove the heat right away to prevent burns. The risk of burns is higher if you cannot feel pain, heat, or cold. General instructions Take over-the-counter and prescription medicines as told by your health care provider. Finish your antibiotics even if you start to feel better. Clean the ear as told by your health care provider. Return to your normal activities as told by your health care provider. Ask your health care provider what activities are safe for you. Contact a health care provider if: You have new or increasing chills or fever. You have any signs of infection that get worse or do not get better. These may include more redness or drainage. You have a severe headache. Get help right away if: You have a sudden increase in symptoms such as pain and swelling. This information is not intended to replace advice given to you by your health care provider. Make sure you discuss any questions you have with your health care provider. Document Revised: 07/13/2021 Document Reviewed: 07/13/2021 Elsevier Patient Education  2024 [employer].

## 2022-11-08 NOTE — Progress Notes (Signed)
Subjective:    Patient ID: Autumn Moyer, female    DOB: 1940-06-19, 82 y.o.   MRN: [id number]  Chief Complaint  Patient presents with   Ear Pain    Right ear red and swollen since Sunday morning    PT presents to the office today with outer ear swelling and pain that started Sunday. Denies any injury.  Otalgia  There is pain in the right ear. This is a new problem. The current episode started in the past 7 days. The problem occurs constantly. There has been no fever. The pain is mild. Pertinent negatives include no coughing, diarrhea, ear discharge, headaches, hearing loss, rhinorrhea or sore throat. She has tried acetaminophen for the symptoms. The treatment provided mild relief.      Review of Systems  HENT:  Positive for ear pain. Negative for ear discharge, hearing loss, rhinorrhea and sore throat.   Respiratory:  Negative for cough.   Gastrointestinal:  Negative for diarrhea.  Neurological:  Negative for headaches.  All other systems reviewed and are negative.      Objective:   Physical Exam Vitals reviewed.  Constitutional:      General: She is not in acute distress.    Appearance: She is well-developed.  HENT:     Head: Normocephalic and atraumatic.     Right Ear: Tenderness present.     Ears:     Comments: Right outer ear erythemas, warm, and tender Eyes:     Pupils: Pupils are equal, round, and reactive to light.  Neck:     Thyroid: No thyromegaly.  Cardiovascular:     Rate and Rhythm: Normal rate and regular rhythm.     Heart sounds: Normal heart sounds. No murmur heard. Pulmonary:     Effort: Pulmonary effort is normal. No respiratory distress.     Breath sounds: Normal breath sounds. No wheezing.  Abdominal:     General: Bowel sounds are normal. There is no distension.     Palpations: Abdomen is soft.     Tenderness: There is no abdominal tenderness.  Musculoskeletal:        General: No tenderness. Normal range of motion.     Cervical back: Normal  range of motion and neck supple.  Skin:    General: Skin is warm and dry.  Neurological:     Mental Status: She is alert and oriented to person, place, and time.     Cranial Nerves: No cranial nerve deficit.     Deep Tendon Reflexes: Reflexes are normal and symmetric.  Psychiatric:        Behavior: Behavior normal.        Thought Content: Thought content normal.        Judgment: Judgment normal.       BP 109/69   Pulse 84   Temp 98.7 F (37.1 C) (Temporal)   Ht 5\' 4"  (1.626 m)   Wt 145 lb 6.4 oz (66 kg)   SpO2 96%   BMI 24.96 kg/m      Assessment & Plan:   Autumn Moyer comes in today with chief complaint of Ear Pain (Right ear red and swollen since Sunday morning )   Diagnosis and orders addressed:  1. Perichondritis Keep clean and dry Start cipro 500 mg BID  Follow up if symptoms worsen or do not improve  - ciprofloxacin (CIPRO) 500 MG tablet; Take 1 tablet (500 mg total) by mouth 2 (two) times daily.  Dispense: 14 tablet; Refill: 0  Jannifer Rodney, FNP

## 2022-12-01 ENCOUNTER — Ambulatory Visit
Admission: RE | Admit: 2022-12-01 | Discharge: 2022-12-01 | Disposition: A | Discharge status: Home / Self Care | Payer: PPO | Source: Ambulatory Visit | Attending: Family | Admitting: Family

## 2022-12-01 DIAGNOSIS — Z1231 Encounter for screening mammogram for malignant neoplasm of breast: Secondary | ICD-10-CM

## 2022-12-05 ENCOUNTER — Other Ambulatory Visit: Payer: Self-pay | Admitting: Family

## 2022-12-05 DIAGNOSIS — R928 Other abnormal and inconclusive findings on diagnostic imaging of breast: Secondary | ICD-10-CM

## 2022-12-08 DIAGNOSIS — X32XXXD Exposure to sunlight, subsequent encounter: Secondary | ICD-10-CM | POA: Diagnosis not present

## 2022-12-08 DIAGNOSIS — L570 Actinic keratosis: Secondary | ICD-10-CM | POA: Diagnosis not present

## 2022-12-08 DIAGNOSIS — L820 Inflamed seborrheic keratosis: Secondary | ICD-10-CM | POA: Diagnosis not present

## 2022-12-09 ENCOUNTER — Ambulatory Visit
Admission: RE | Admit: 2022-12-09 | Discharge: 2022-12-09 | Disposition: A | Discharge status: Home / Self Care | Payer: PPO | Source: Ambulatory Visit | Attending: Family | Admitting: Family

## 2022-12-09 DIAGNOSIS — R928 Other abnormal and inconclusive findings on diagnostic imaging of breast: Secondary | ICD-10-CM

## 2022-12-12 ENCOUNTER — Telehealth: Payer: Self-pay | Admitting: Family

## 2022-12-12 MED ORDER — LEVOTHYROXINE SODIUM 100 MCG PO TABS: 100.0000 ug | ORAL_TABLET | Freq: Every day | ORAL | 1 refills | Status: DC

## 2022-12-12 NOTE — Telephone Encounter (Signed)
  Prescription Request  12/12/2022  Is this a "Controlled Substance" medicine? levothyroxine (SYNTHROID) 100 MCG tablet   Have you seen your PCP in the last 2 weeks? Yes next week 12/19/2022  If YES, route message to pool  -  If NO, patient needs to be scheduled for appointment.  What is the name of the medication or equipment? levothyroxine (SYNTHROID) 100 MCG tablet   Have you contacted your pharmacy to request a refill? No needs rx and pt will be out if rx on Sat or can she wait until apt on Monday. Please call back   Which pharmacy would you like this sent to? [facility]   Patient notified that their request is being sent to the clinical staff for review and that they should receive a response within 2 business days.

## 2022-12-12 NOTE — Telephone Encounter (Signed)
Aware refill sent to pharmacy ?

## 2022-12-13 NOTE — Progress Notes (Addendum)
[facility]   MULTIDISCIPLINARY HEART VALVE CLINIC                                     Cardiology Office Note:    Date:  12/30/2022   ID:  NAHAL TRAUTMAN, DOB 08/22/1940, MRN [id number]  PCP:  [name], FNP  [facility] Cardiologist:  Rollene Rotunda, MD  [facility] Electrophysiologist:  None   Referring MD: [name], FNP   1 year s/p TAVR  History of Present Illness:    Autumn Moyer is a 82 y.o. female with a hx of HTN, PAF on Eliquis, hypothyroidism, and severe aortic stenosis s/p TAVR (12/14/21) who presents to clinic for follow up.    Echo 05/2021 showed severe AS with a mean gradient at 38 mmHg with an AVA at 0.5cm2. She underwent [facility] 09/23/22 showed mild obstructive CAD with a proximal LAD lesion at 20% stenosis. S/p TAVR with a 23 mm Edwards Sapien 3 THV via the TF approach on 12/14/21. Post operative echo showed EF 60%, normally functioning TAVR with a mean gradient of 9 mmHg and no PVL as well as mild MR and moderate pulm HTN. She was discharged on home Eliquis.    Today the patient presents to clinic for follow up. Here alone. She is doing relatively well. Takes care of her husband who is not in good health. No CP. She does have continued shortness of breath but markedly improved since TAVR. She still cannot do all the things she would like without shortness of breath. No LE edema, orthopnea or PND. No dizziness or syncope. No blood in stool or urine. No palpitations.    Past Medical History:  Diagnosis Date   Atrial fibrillation, persistent (HCC)    Complication of anesthesia    Hiatal hernia    Hypertension    PONV (postoperative nausea and vomiting)    S/P TAVR (transcatheter aortic valve replacement) 12/14/2021   s/p TAVR with a 23mm Edwards S3UR via the TF approach by Dr. Lynnette Caffey & Dr. Leafy Ro   Severe aortic stenosis    Squamous cell carcinoma of skin 01/16/2018   in situ-right knee post (txpbx)   Thyroid disease     Past  Surgical History:  Procedure Laterality Date   ABDOMINAL AORTOGRAM N/A 09/22/2021   Procedure: ABDOMINAL AORTOGRAM;  Surgeon: Orbie Pyo, MD;  Location: [facility] INVASIVE CV LAB;  Service: Cardiovascular;  Laterality: N/A;   CARDIAC CATHETERIZATION     EYE SURGERY     INTRAOPERATIVE TRANSTHORACIC ECHOCARDIOGRAM N/A 12/14/2021   Procedure: INTRAOPERATIVE TRANSTHORACIC ECHOCARDIOGRAM;  Surgeon: Orbie Pyo, MD;  Location: [facility] OR;  Service: Open Heart Surgery;  Laterality: N/A;   MULTIPLE EXTRACTIONS WITH ALVEOLOPLASTY N/A 12/02/2021   Procedure: MULTIPLE EXTRACTION WITH ALVEOLOPLASTY;  Surgeon: Sharman Cheek, DMD;  Location: [facility] OR;  Service: Dentistry;  Laterality: N/A;   RIGHT HEART CATH AND CORONARY/GRAFT ANGIOGRAPHY N/A 09/22/2021   Procedure: RIGHT HEART CATH AND CORONARY/GRAFT ANGIOGRAPHY;  Surgeon: Orbie Pyo, MD;  Location: [facility] INVASIVE CV LAB;  Service: Cardiovascular;  Laterality: N/A;   TOE SURGERY     TRANSCATHETER AORTIC VALVE REPLACEMENT, TRANSFEMORAL N/A 12/14/2021   Procedure: Transcatheter Aortic Valve Replacement, Transfemoral using Edwards 23 MM SAPIEN 3 Ultra;  Surgeon: Orbie Pyo, MD;  Location: [facility] OR;  Service: Open Heart Surgery;  Laterality: N/A;  Transfemoral approach   TUBAL  LIGATION      Current Medications: Current Meds  Medication Sig   acetaminophen (TYLENOL) 500 MG tablet Take 1,000 mg by mouth in the morning.   azithromycin (ZITHROMAX) 500 MG tablet Take 500 mg by mouth once. Before dental or surgery   carboxymethylcellulose (REFRESH TEARS) 0.5 % SOLN Place 2 drops into both eyes 3 (three) times daily as needed (dry eyes).   levothyroxine (SYNTHROID) 100 MCG tablet Take 1 tablet (100 mcg total) by mouth daily.   Multiple Vitamin (MULTIVITAMIN WITH MINERALS) TABS tablet Take 1 tablet by mouth every evening.   [DISCONTINUED] apixaban (ELIQUIS) 5 MG TABS tablet Take 1 tablet (5 mg total) by mouth 2 (two) times daily.   [DISCONTINUED]  benazepril-hydrochlorthiazide (LOTENSIN HCT) 20-25 MG tablet Take 1 tablet by mouth daily.   [DISCONTINUED] digoxin (LANOXIN) 0.125 MG tablet Take 1 tablet by mouth once daily   [DISCONTINUED] furosemide (LASIX) 20 MG tablet Take 1 tablet (20 mg total) by mouth daily as needed for edema or fluid.   [DISCONTINUED] levocetirizine (XYZAL) 5 MG tablet Take 1 tablet (5 mg total) by mouth every evening.   [DISCONTINUED] metoprolol tartrate (LOPRESSOR) 50 MG tablet Take 1 tablet (50 mg total) by mouth 2 (two) times daily.   [DISCONTINUED] mometasone (ELOCON) 0.1 % cream Apply 1 application topically daily.   [DISCONTINUED] pantoprazole (PROTONIX) 40 MG tablet Take 1 tablet (40 mg total) by mouth daily.   [DISCONTINUED] rosuvastatin (CRESTOR) 10 MG tablet Take 1 tablet (10 mg total) by mouth daily.   [DISCONTINUED] tacrolimus (PROTOPIC) 0.1 % ointment Apply topically in the morning and at bedtime.      ROS:   Please see the history of present illness.    All other systems reviewed and are negative.  EKGs   EKG:  EKG is NOT ordered today.    Recent Labs: 08/19/2022: TSH 0.641 12/19/2022: ALT 16; BUN 22; Creatinine, Ser 1.01; Hemoglobin 14.4; Platelets 206; Potassium 4.2; Sodium 141  Recent Lipid Panel    Component Value Date/Time   CHOL 165 06/16/2022 1007   TRIG 214 (H) 06/16/2022 1007   HDL 43 06/16/2022 1007   CHOLHDL 3.8 06/16/2022 1007   LDLCALC 86 06/16/2022 1007     Risk Assessment/Calculations:    CHA2DS2-VASc Score =   4  This indicates a  % annual risk of stroke. The patient's score is based upon: CHF History: 0 HTN History: 1 Diabetes History: 0 Stroke History: 0 Age Score: 2 Gender Score: 1            Physical Exam:    VS:  BP 116/78   Pulse 73   Ht 5\' 4"  (1.626 m)   Wt 143 lb 6.4 oz (65 kg)   SpO2 98%   BMI 24.61 kg/m     Wt Readings from Last 3 Encounters:  12/23/22 145 lb (65.8 kg)  12/19/22 144 lb 12.8 oz (65.7 kg)  12/14/22 143 lb 6.4 oz (65 kg)      GEN: Well nourished, well developed in no acute distress NECK: No JVD; No carotid bruits CARDIAC: irreg irreg, soft apical systolic murmur. No rubs, gallops RESPIRATORY:  Clear to auscultation without rales, wheezing or rhonchi  ABDOMEN: Soft, non-tender, non-distended EXTREMITIES:  No edema; No deformity   ASSESSMENT:    1. S/P TAVR (transcatheter aortic valve replacement)   2. Essential hypertension   3. Persistent atrial fibrillation (HCC)   4. Hiatal hernia   5. Hypertriglyceridemia    PLAN:    In  order of problems listed above:  Severe AS s/p TAVR: echo today shows EF 60%, normally functioning TAVR with a mean gradient of 15.2 mm hg and no PVL as well as mild to mod MR and mod to severe TR. She has NYHA class II symptoms with continued shortness of breath although much improved since TAVR. SBE prophylaxis discussed; she has azithromycin due to a PCN allergy. Continue on Eliquis 5mg  BID.   HTN: Bp well controlled. No changes made.    Persistent atrial fibrillation: rates stable overall stable. Continue on Eliquis 5mg  BID, digoxin 0.125 mcg, daily, and lopressor 50 mg BID.    Hiatal hernia: pre TAVR CT showed a very large hiatal hernia with displacement of the thoracic aorta into the R chest. Tortuous course noted. Dr. Antoine Poche suggested that she discuss with Dr. Laneta Simmers as it might be contributing to continued SOB. Will get a repeat chest CT and consultation with Dr. Cliffton Asters to see if this could potentially be treated robotically.   Elevated TG: continue Crestor 10mg  daily and dietary changes.   _______________________________  ADDENDUM: 12/30/22  Pt saw Dr Cliffton Asters and is set up for robotic assisted hiatal hernia repair with fundoplication on 01/09/23. Pre op clearance provided below.   Ms. Eklof perioperative risk of a major cardiac event is 0.9% according to the Revised Cardiac Risk Index (RCRI).  Therefore, she is at low risk for perioperative complications.    Her functional capacity is excellent at 7.01 METs according to the Duke Activity Status Index (DASI). Recommendations: According to ACC/AHA guidelines, no further cardiovascular testing needed.  The patient may proceed to surgery at acceptable risk.   Antiplatelet and/or Anticoagulation Recommendations: Eliquis (Apixaban) can be held for 2 days prior to surgery.  Please resume post op when felt to be safe.  This was discussed with Carmela Hurt [facility] in clinic today.     Medication Adjustments/Labs and Tests Ordered: Current medicines are reviewed at length with the patient today.  Concerns regarding medicines are outlined above.  Orders Placed This Encounter  Procedures   CT Chest Wo Contrast   No orders of the defined types were placed in this encounter.   Patient Instructions  Medication Instructions:   Your physician recommends that you continue on your current medications as directed. Please refer to the Current Medication list given to you today.  *If you need a refill on your cardiac medications before your next appointment, please call your pharmacy*    Testing/Procedures:   CHEST CT WO CONTRAST TO BE DONE FOR HIATAL HERNIA--THIS NEEDS TO BE DONE BEFORE HER 12/23/22 APPOINTMENT WITH DR. Cliffton Asters FOR CONSULTATION OF HIATAL HERNIA REPAIR SURGERY    Follow-Up: At [facility], you and your health needs are our priority.  As part of our continuing mission to provide you with exceptional heart care, we have created designated Provider Care Teams.  These Care Teams include your primary Cardiologist (physician) and Advanced Practice Providers (APPs -  Physician Assistants and Nurse Practitioners) who all work together to provide you with the care you need, when you need it.  We recommend signing up for the patient portal called "MyChart".  Sign up information is provided on this After Visit Summary.  MyChart is used to connect with patients for Virtual Visits  (Telemedicine).  Patients are able to view lab/test results, encounter notes, upcoming appointments, etc.  Non-urgent messages can be sent to your provider as well.   To learn more about what you can do with MyChart,  go to [email or web address].    Your next appointment:   6 month(s)  Provider:   Rollene Rotunda, MD          Signed, Cline Crock, PA-C  12/30/2022 12:21 PM    [facility]

## 2022-12-14 ENCOUNTER — Ambulatory Visit: Payer: PPO | Attending: Cardiology

## 2022-12-14 ENCOUNTER — Ambulatory Visit: Payer: PPO

## 2022-12-14 VITALS — BP 116/78 | HR 73 | Ht 64.0 in | Wt 143.4 lb

## 2022-12-14 DIAGNOSIS — I4819 Other persistent atrial fibrillation: Secondary | ICD-10-CM | POA: Insufficient documentation

## 2022-12-14 DIAGNOSIS — I10 Essential (primary) hypertension: Secondary | ICD-10-CM | POA: Insufficient documentation

## 2022-12-14 DIAGNOSIS — E781 Pure hyperglyceridemia: Secondary | ICD-10-CM | POA: Diagnosis not present

## 2022-12-14 DIAGNOSIS — K449 Diaphragmatic hernia without obstruction or gangrene: Secondary | ICD-10-CM | POA: Diagnosis not present

## 2022-12-14 DIAGNOSIS — Z952 Presence of prosthetic heart valve: Secondary | ICD-10-CM

## 2022-12-14 LAB — ECHOCARDIOGRAM COMPLETE
AV Mean grad: 15.2 mmHg
AV Peak grad: 23.5 mmHg
Ao pk vel: 2.43 m/s
S' Lateral: 2.2 cm

## 2022-12-14 NOTE — Patient Instructions (Signed)
Medication Instructions:   Your physician recommends that you continue on your current medications as directed. Please refer to the Current Medication list given to you today.  *If you need a refill on your cardiac medications before your next appointment, please call your pharmacy*    Testing/Procedures:   CHEST CT WO CONTRAST TO BE DONE FOR HIATAL HERNIA--THIS NEEDS TO BE DONE BEFORE HER 12/23/22 APPOINTMENT WITH DR. Cliffton Asters FOR CONSULTATION OF HIATAL HERNIA REPAIR SURGERY    Follow-Up: At [facility], you and your health needs are our priority.  As part of our continuing mission to provide you with exceptional heart care, we have created designated Provider Care Teams.  These Care Teams include your primary Cardiologist (physician) and Advanced Practice Providers (APPs -  Physician Assistants and Nurse Practitioners) who all work together to provide you with the care you need, when you need it.  We recommend signing up for the patient portal called "MyChart".  Sign up information is provided on this After Visit Summary.  MyChart is used to connect with patients for Virtual Visits (Telemedicine).  Patients are able to view lab/test results, encounter notes, upcoming appointments, etc.  Non-urgent messages can be sent to your provider as well.   To learn more about what you can do with MyChart, go to [email or web address].    Your next appointment:   6 month(s)  Provider:   Rollene Rotunda, MD

## 2022-12-19 ENCOUNTER — Other Ambulatory Visit: Payer: PPO

## 2022-12-19 ENCOUNTER — Ambulatory Visit (INDEPENDENT_AMBULATORY_CARE_PROVIDER_SITE_OTHER): Payer: PPO | Admitting: Family

## 2022-12-19 ENCOUNTER — Encounter: Payer: Self-pay | Admitting: Family

## 2022-12-19 VITALS — BP 121/78 | HR 89 | Temp 97.9°F | Ht 64.0 in | Wt 144.8 lb

## 2022-12-19 DIAGNOSIS — E039 Hypothyroidism, unspecified: Secondary | ICD-10-CM | POA: Diagnosis not present

## 2022-12-19 DIAGNOSIS — E663 Overweight: Secondary | ICD-10-CM

## 2022-12-19 DIAGNOSIS — I350 Nonrheumatic aortic (valve) stenosis: Secondary | ICD-10-CM | POA: Diagnosis not present

## 2022-12-19 DIAGNOSIS — J309 Allergic rhinitis, unspecified: Secondary | ICD-10-CM | POA: Diagnosis not present

## 2022-12-19 DIAGNOSIS — I4891 Unspecified atrial fibrillation: Secondary | ICD-10-CM | POA: Diagnosis not present

## 2022-12-19 DIAGNOSIS — K449 Diaphragmatic hernia without obstruction or gangrene: Secondary | ICD-10-CM | POA: Diagnosis not present

## 2022-12-19 DIAGNOSIS — K219 Gastro-esophageal reflux disease without esophagitis: Secondary | ICD-10-CM

## 2022-12-19 DIAGNOSIS — Z7901 Long term (current) use of anticoagulants: Secondary | ICD-10-CM

## 2022-12-19 DIAGNOSIS — Z952 Presence of prosthetic heart valve: Secondary | ICD-10-CM

## 2022-12-19 DIAGNOSIS — I10 Essential (primary) hypertension: Secondary | ICD-10-CM

## 2022-12-19 DIAGNOSIS — L309 Dermatitis, unspecified: Secondary | ICD-10-CM

## 2022-12-19 DIAGNOSIS — D509 Iron deficiency anemia, unspecified: Secondary | ICD-10-CM

## 2022-12-19 DIAGNOSIS — Z23 Encounter for immunization: Secondary | ICD-10-CM | POA: Diagnosis not present

## 2022-12-19 MED ORDER — BENAZEPRIL-HYDROCHLOROTHIAZIDE 20-25 MG PO TABS
1.0000 | ORAL_TABLET | Freq: Every day | ORAL | 1 refills | Status: DC
Start: 2022-12-19 — End: 2023-01-12

## 2022-12-19 MED ORDER — MOMETASONE FUROATE 0.1 % EX CREA: 1.0000 | TOPICAL_CREAM | Freq: Every day | CUTANEOUS | 2 refills | Status: AC

## 2022-12-19 MED ORDER — FUROSEMIDE 20 MG PO TABS
20.0000 mg | ORAL_TABLET | Freq: Every day | ORAL | 1 refills | Status: DC | PRN
Start: 2022-12-19 — End: 2023-06-09

## 2022-12-19 MED ORDER — APIXABAN 5 MG PO TABS: 5.0000 mg | ORAL_TABLET | Freq: Two times a day (BID) | ORAL | 1 refills | Status: DC

## 2022-12-19 MED ORDER — DIGOXIN 125 MCG PO TABS: 125.0000 ug | ORAL_TABLET | Freq: Every day | ORAL | 3 refills | Status: DC

## 2022-12-19 MED ORDER — LEVOCETIRIZINE DIHYDROCHLORIDE 5 MG PO TABS: 5.0000 mg | ORAL_TABLET | Freq: Every evening | ORAL | 1 refills | Status: DC

## 2022-12-19 MED ORDER — PANTOPRAZOLE SODIUM 40 MG PO TBEC: 40.0000 mg | DELAYED_RELEASE_TABLET | Freq: Every day | ORAL | 1 refills | Status: DC

## 2022-12-19 MED ORDER — METOPROLOL TARTRATE 50 MG PO TABS: 50.0000 mg | ORAL_TABLET | Freq: Two times a day (BID) | ORAL | 3 refills | Status: DC

## 2022-12-19 MED ORDER — TACROLIMUS 0.1 % EX OINT
TOPICAL_OINTMENT | Freq: Two times a day (BID) | CUTANEOUS | 4 refills | Status: AC
Start: 2022-12-19 — End: ?

## 2022-12-19 NOTE — Progress Notes (Signed)
Subjective:    Patient ID: Autumn Moyer, female    DOB: September 24, 1940, 82 y.o.   MRN: [id number]  Chief Complaint  Patient presents with   Medical Management of Chronic Issues    Spot in scalp derm froze twice and is bigger this morning you froze it once    Pt presents to the offic today for chronic follow up. She is followed by Cardiologists  for A Fib, Aortic stenosis and gets an Echo each year. She is currently on Eliquis 5 mg BID.    She had aortic valve replacement on 12/14/21.  She is scheduled for a CT chest on 12/21/22 to check on hiatal hernia because she continues to have mild SOB.   Hypertension This is a chronic problem. The current episode started more than 1 year ago. The problem has been resolved since onset. The problem is controlled. Associated symptoms include malaise/fatigue and shortness of breath. Pertinent negatives include no peripheral edema. Risk factors for coronary artery disease include dyslipidemia, obesity and sedentary lifestyle. The current treatment provides moderate improvement. Identifiable causes of hypertension include a thyroid problem.  Gastroesophageal Reflux She complains of belching, heartburn and a hoarse voice. This is a chronic problem. The current episode started more than 1 year ago. The problem occurs occasionally. Associated symptoms include fatigue. Risk factors include obesity. She has tried a PPI for the symptoms. The treatment provided moderate relief.  Thyroid Problem Presents for follow-up visit. Symptoms include fatigue and hoarse voice. Patient reports no constipation, depressed mood or dry skin. The symptoms have been stable.  Anemia Presents for follow-up visit. Symptoms include malaise/fatigue.      Review of Systems  Constitutional:  Positive for fatigue and malaise/fatigue.  HENT:  Positive for hoarse voice.   Respiratory:  Positive for shortness of breath.   Gastrointestinal:  Positive for heartburn. Negative for  constipation.  All other systems reviewed and are negative.      Objective:   Physical Exam Vitals reviewed.  Constitutional:      General: She is not in acute distress.    Appearance: She is well-developed.  HENT:     Head: Normocephalic and atraumatic.  Eyes:     Pupils: Pupils are equal, round, and reactive to light.  Neck:     Thyroid: No thyromegaly.  Cardiovascular:     Rate and Rhythm: Normal rate and regular rhythm.     Heart sounds: Normal heart sounds. No murmur heard. Pulmonary:     Effort: Pulmonary effort is normal. No respiratory distress.     Breath sounds: Normal breath sounds. No wheezing.  Abdominal:     General: Bowel sounds are normal. There is no distension.     Palpations: Abdomen is soft.     Tenderness: There is no abdominal tenderness.  Musculoskeletal:        General: No tenderness. Normal range of motion.     Cervical back: Normal range of motion and neck supple.  Skin:    General: Skin is warm and dry.  Neurological:     Mental Status: She is alert and oriented to person, place, and time.     Cranial Nerves: No cranial nerve deficit.     Deep Tendon Reflexes: Reflexes are normal and symmetric.  Psychiatric:        Behavior: Behavior normal.        Thought Content: Thought content normal.        Judgment: Judgment normal.  BP 121/78   Pulse 89   Temp 97.9 F (36.6 C) (Temporal)   Ht 5\' 4"  (1.626 m)   Wt 144 lb 12.8 oz (65.7 kg)   SpO2 95%   BMI 24.85 kg/m      Assessment & Plan:  DORMA EICHELBERGER comes in today with chief complaint of Medical Management of Chronic Issues (Spot in scalp derm froze twice and is bigger this morning you froze it once )   Diagnosis and orders addressed:  1. Atrial fibrillation, unspecified type (HCC) - apixaban (ELIQUIS) 5 MG TABS tablet; Take 1 tablet (5 mg total) by mouth 2 (two) times daily.  Dispense: 180 tablet; Refill: 1 - digoxin (LANOXIN) 0.125 MG tablet; Take 1 tablet (125 mcg total) by  mouth daily.  Dispense: 90 tablet; Refill: 3 - CMP14+EGFR - CBC with Differential/Platelet  2. Essential hypertension  - benazepril-hydrochlorthiazide (LOTENSIN HCT) 20-25 MG tablet; Take 1 tablet by mouth daily.  Dispense: 90 tablet; Refill: 1 - digoxin (LANOXIN) 0.125 MG tablet; Take 1 tablet (125 mcg total) by mouth daily.  Dispense: 90 tablet; Refill: 3 - furosemide (LASIX) 20 MG tablet; Take 1 tablet (20 mg total) by mouth daily as needed for edema or fluid.  Dispense: 90 tablet; Refill: 1 - metoprolol tartrate (LOPRESSOR) 50 MG tablet; Take 1 tablet (50 mg total) by mouth 2 (two) times daily.  Dispense: 180 tablet; Refill: 3 - CMP14+EGFR - CBC with Differential/Platelet  3. Allergic rhinitis, unspecified seasonality, unspecified trigger - levocetirizine (XYZAL) 5 MG tablet; Take 1 tablet (5 mg total) by mouth every evening.  Dispense: 90 tablet; Refill: 1 - CMP14+EGFR - CBC with Differential/Platelet  4. Gastroesophageal reflux disease, unspecified whether esophagitis present - pantoprazole (PROTONIX) 40 MG tablet; Take 1 tablet (40 mg total) by mouth daily.  Dispense: 90 tablet; Refill: 1 - CMP14+EGFR - CBC with Differential/Platelet  5. Eczema, unspecified type - tacrolimus (PROTOPIC) 0.1 % ointment; Apply topically in the morning and at bedtime.  Dispense: 30 g; Refill: 4 - CMP14+EGFR - CBC with Differential/Platelet  6. Encounter for immunization - Flu Vaccine Trivalent High Dose (Fluad) - CMP14+EGFR - CBC with Differential/Platelet  7. Essential hypertension, benign - CMP14+EGFR - CBC with Differential/Platelet  8. Hiatal hernia - CMP14+EGFR - CBC with Differential/Platelet  9. Iron deficiency anemia, unspecified iron deficiency anemia type - CMP14+EGFR - CBC with Differential/Platelet  10. Hypothyroidism, unspecified type - CMP14+EGFR - CBC with Differential/Platelet  11. Overweight (BMI 25.0-29.9) - CMP14+EGFR - CBC with Differential/Platelet  12.  Long term current use of anticoagulant therapy - CMP14+EGFR - CBC with Differential/Platelet  13. Severe aortic stenosis - CMP14+EGFR - CBC with Differential/Platelet  14. S/P TAVR (transcatheter aortic valve replacement)  - CMP14+EGFR - CBC with Differential/Platelet   Labs pending Continue current medications  Health Maintenance reviewed Diet and exercise encouraged  Follow up plan: 6 months   Jannifer Rodney, FNP

## 2022-12-19 NOTE — Patient Instructions (Signed)
Health Maintenance After Age 80 After age 80, you are at a higher risk for certain long-term diseases and infections as well as injuries from falls. Falls are a major cause of broken bones and head injuries in people who are older than age 80. Getting regular preventive care can help to keep you healthy and well. Preventive care includes getting regular testing and making lifestyle changes as recommended by your health care provider. Talk with your health care provider about: Which screenings and tests you should have. A screening is a test that checks for a disease when you have no symptoms. A diet and exercise plan that is right for you. What should I know about screenings and tests to prevent falls? Screening and testing are the best ways to find a health problem early. Early diagnosis and treatment give you the best chance of managing medical conditions that are common after age 80. Certain conditions and lifestyle choices may make you more likely to have a fall. Your health care provider may recommend: Regular vision checks. Poor vision and conditions such as cataracts can make you more likely to have a fall. If you wear glasses, make sure to get your prescription updated if your vision changes. Medicine review. Work with your health care provider to regularly review all of the medicines you are taking, including over-the-counter medicines. Ask your health care provider about any side effects that may make you more likely to have a fall. Tell your health care provider if any medicines that you take make you feel dizzy or sleepy. Strength and balance checks. Your health care provider may recommend certain tests to check your strength and balance while standing, walking, or changing positions. Foot health exam. Foot pain and numbness, as well as not wearing proper footwear, can make you more likely to have a fall. Screenings, including: Osteoporosis screening. Osteoporosis is a condition that causes  the bones to get weaker and break more easily. Blood pressure screening. Blood pressure changes and medicines to control blood pressure can make you feel dizzy. Depression screening. You may be more likely to have a fall if you have a fear of falling, feel depressed, or feel unable to do activities that you used to do. Alcohol use screening. Using too much alcohol can affect your balance and may make you more likely to have a fall. Follow these instructions at home: Lifestyle Do not drink alcohol if: Your health care provider tells you not to drink. If you drink alcohol: Limit how much you have to: 0-1 drink a day for women. 0-2 drinks a day for men. Know how much alcohol is in your drink. In the [place] one drink equals one 12 oz bottle of beer (355 mL), one 5 oz glass of wine (148 mL), or one 1 oz glass of hard liquor (44 mL). Do not use any products that contain nicotine or tobacco. These products include cigarettes, chewing tobacco, and vaping devices, such as e-cigarettes. If you need help quitting, ask your health care provider. Activity  Follow a regular exercise program to stay fit. This will help you maintain your balance. Ask your health care provider what types of exercise are appropriate for you. If you need a cane or walker, use it as recommended by your health care provider. Wear supportive shoes that have nonskid soles. Safety  Remove any tripping hazards, such as rugs, cords, and clutter. Install safety equipment such as grab bars in bathrooms and safety rails on stairs. Keep rooms and walkways   well-lit. General instructions Talk with your health care provider about your risks for falling. Tell your health care provider if: You fall. Be sure to tell your health care provider about all falls, even ones that seem minor. You feel dizzy, tiredness (fatigue), or off-balance. Take over-the-counter and prescription medicines only as told by your health care provider. These include  supplements. Eat a healthy diet and maintain a healthy weight. A healthy diet includes low-fat dairy products, low-fat (lean) meats, and fiber from whole grains, beans, and lots of fruits and vegetables. Stay current with your vaccines. Schedule regular health, dental, and eye exams. Summary Having a healthy lifestyle and getting preventive care can help to protect your health and wellness after age 80. Screening and testing are the best way to find a health problem early and help you avoid having a fall. Early diagnosis and treatment give you the best chance for managing medical conditions that are more common for people who are older than age 80. Falls are a major cause of broken bones and head injuries in people who are older than age 80. Take precautions to prevent a fall at home. Work with your health care provider to learn what changes you can make to improve your health and wellness and to prevent falls. This information is not intended to replace advice given to you by your health care provider. Make sure you discuss any questions you have with your health care provider. Document Revised: 07/27/2020 Document Reviewed: 07/27/2020 Elsevier Patient Education  2024 [employer] Inc.  

## 2022-12-20 LAB — CBC WITH DIFFERENTIAL/PLATELET
Basophils Absolute: 0.1 10*3/uL (ref 0.0–0.2)
Basos: 1 %
EOS (ABSOLUTE): 0.3 10*3/uL (ref 0.0–0.4)
Eos: 4 %
Hematocrit: 44.3 % (ref 34.0–46.6)
Hemoglobin: 14.4 g/dL (ref 11.1–15.9)
Immature Grans (Abs): 0 10*3/uL (ref 0.0–0.1)
Immature Granulocytes: 0 %
Lymphocytes Absolute: 2.9 10*3/uL (ref 0.7–3.1)
Lymphs: 37 %
MCH: 30.6 pg (ref 26.6–33.0)
MCHC: 32.5 g/dL (ref 31.5–35.7)
MCV: 94 fL (ref 79–97)
Monocytes Absolute: 0.7 10*3/uL (ref 0.1–0.9)
Monocytes: 9 %
Neutrophils Absolute: 3.8 10*3/uL (ref 1.4–7.0)
Neutrophils: 49 %
Platelets: 206 10*3/uL (ref 150–450)
RBC: 4.7 x10E6/uL (ref 3.77–5.28)
RDW: 12.6 % (ref 11.7–15.4)
WBC: 7.7 10*3/uL (ref 3.4–10.8)

## 2022-12-20 LAB — CMP14+EGFR
ALT: 16 [IU]/L (ref 0–32)
AST: 23 [IU]/L (ref 0–40)
Albumin: 4.5 g/dL (ref 3.8–4.8)
Alkaline Phosphatase: 60 [IU]/L (ref 44–121)
BUN/Creatinine Ratio: 22 (ref 12–28)
BUN: 22 mg/dL (ref 8–27)
Bilirubin Total: 1.6 mg/dL — ABNORMAL HIGH (ref 0.0–1.2)
CO2: 24 mmol/L (ref 20–29)
Calcium: 9.9 mg/dL (ref 8.7–10.3)
Chloride: 101 mmol/L (ref 96–106)
Creatinine, Ser: 1.01 mg/dL — ABNORMAL HIGH (ref 0.57–1.00)
Globulin, Total: 2.7 g/dL (ref 1.5–4.5)
Glucose: 106 mg/dL — ABNORMAL HIGH (ref 70–99)
Potassium: 4.2 mmol/L (ref 3.5–5.2)
Sodium: 141 mmol/L (ref 134–144)
Total Protein: 7.2 g/dL (ref 6.0–8.5)
eGFR: 56 mL/min/{1.73_m2} — ABNORMAL LOW (ref >59)

## 2022-12-21 ENCOUNTER — Other Ambulatory Visit (HOSPITAL_BASED_OUTPATIENT_CLINIC_OR_DEPARTMENT_OTHER): Payer: PPO

## 2022-12-21 ENCOUNTER — Ambulatory Visit (HOSPITAL_BASED_OUTPATIENT_CLINIC_OR_DEPARTMENT_OTHER)
Admission: RE | Admit: 2022-12-21 | Discharge: 2022-12-21 | Disposition: A | Discharge status: Home / Self Care | Payer: PPO | Source: Ambulatory Visit | Attending: Physician Assistant | Admitting: Physician Assistant

## 2022-12-21 DIAGNOSIS — I700 Atherosclerosis of aorta: Secondary | ICD-10-CM | POA: Diagnosis not present

## 2022-12-21 DIAGNOSIS — K449 Diaphragmatic hernia without obstruction or gangrene: Secondary | ICD-10-CM | POA: Insufficient documentation

## 2022-12-21 DIAGNOSIS — I771 Stricture of artery: Secondary | ICD-10-CM | POA: Diagnosis not present

## 2022-12-23 ENCOUNTER — Institutional Professional Consult (permissible substitution): Payer: PPO | Admitting: Thoracic Surgery (Cardiothoracic Vascular Surgery)

## 2022-12-23 VITALS — BP 123/75 | HR 78 | Resp 20 | Ht 64.0 in | Wt 145.0 lb

## 2022-12-23 DIAGNOSIS — K449 Diaphragmatic hernia without obstruction or gangrene: Secondary | ICD-10-CM

## 2022-12-23 NOTE — H&P (View-Only) (Signed)
[address]       [place] [address]             [phone]                    NESTORA INNAMORATO [facility] Medical Record #[id number] Date of Birth: Sep 30, 1940  Referring: Rollene Rotunda, MD Primary Care: [name], FNP Primary Cardiologist: Rollene Rotunda, MD  Chief Complaint:    Chief Complaint  Patient presents with   Hiatal Hernia    CT chest 10/2    History of Present Illness:    Autumn Moyer 82 y.o. female presents for surgical evaluation of a large type IV hiatal hernia.  She previously underwent a TAVR in 2023, but despite having improvement in her exercise tolerance, she continues to have some pronounced shortness of breath with small activities.  She is unable to walk very far, or shop in the grocery store without symptoms.  She has had persistent reflux, she has occasional dysphagia, and recently has started to develop some left back/side pain with certain meals.  She also admits to worsening shortness of breath with meals.     Zubrod Score: At the time of surgery this patient's most appropriate activity status/level should be described as: [x]     0    Normal activity, no symptoms []     1    Restricted in physical strenuous activity but ambulatory, able to do out light work []     2    Ambulatory and capable of self care, unable to do work activities, up and about               >50 % of waking hours                              []     3    Only limited self care, in bed greater than 50% of waking hours []     4    Completely disabled, no self care, confined to bed or chair []     5    Moribund   Past Medical History:  Diagnosis Date   Atrial fibrillation, persistent (HCC)    Complication of anesthesia    Hiatal hernia    Hypertension    PONV (postoperative nausea and vomiting)    S/P TAVR (transcatheter aortic valve replacement) 12/14/2021   s/p TAVR with a 23mm Edwards S3UR via the TF approach by Dr. Lynnette Caffey & Dr. Leafy Ro   Severe  aortic stenosis    Squamous cell carcinoma of skin 01/16/2018   in situ-right knee post (txpbx)   Thyroid disease     Past Surgical History:  Procedure Laterality Date   ABDOMINAL AORTOGRAM N/A 09/22/2021   Procedure: ABDOMINAL AORTOGRAM;  Surgeon: Orbie Pyo, MD;  Location: [facility] INVASIVE CV LAB;  Service: Cardiovascular;  Laterality: N/A;   CARDIAC CATHETERIZATION     EYE SURGERY     INTRAOPERATIVE TRANSTHORACIC ECHOCARDIOGRAM N/A 12/14/2021   Procedure: INTRAOPERATIVE TRANSTHORACIC ECHOCARDIOGRAM;  Surgeon: Orbie Pyo, MD;  Location: [facility] OR;  Service: Open Heart Surgery;  Laterality: N/A;   MULTIPLE EXTRACTIONS WITH ALVEOLOPLASTY N/A 12/02/2021   Procedure: MULTIPLE EXTRACTION WITH ALVEOLOPLASTY;  Surgeon: Sharman Cheek, DMD;  Location: [facility] OR;  Service: Dentistry;  Laterality: N/A;   RIGHT HEART CATH AND CORONARY/GRAFT ANGIOGRAPHY N/A 09/22/2021   Procedure: RIGHT HEART CATH AND CORONARY/GRAFT ANGIOGRAPHY;  Surgeon: Orbie Pyo, MD;  Location: [facility] INVASIVE CV LAB;  Service: Cardiovascular;  Laterality: N/A;   TOE SURGERY     TRANSCATHETER AORTIC VALVE REPLACEMENT, TRANSFEMORAL N/A 12/14/2021   Procedure: Transcatheter Aortic Valve Replacement, Transfemoral using Edwards 23 MM SAPIEN 3 Ultra;  Surgeon: Orbie Pyo, MD;  Location: [facility] OR;  Service: Open Heart Surgery;  Laterality: N/A;  Transfemoral approach   TUBAL LIGATION      Family History  Problem Relation Age of Onset   Migraines Mother    Hypertension Mother    Hyperlipidemia Mother    Heart failure Mother        Age 43   Hyperlipidemia Father    Hypertension Father    Heart disease Father        died of MI after developoing cancer.   Lung cancer Father    Hypertension Sister    Breast cancer Paternal Aunt    Migraines Maternal Grandfather    Migraines Daughter    Breast cancer Daughter      Social History   Tobacco Use  Smoking Status Never   Passive exposure: Past  Smokeless Tobacco Never     Social History   Substance and Sexual Activity  Alcohol Use No   Alcohol/week: 0.0 standard drinks of alcohol     Allergies  Allergen Reactions   Noroxin [Norfloxacin] Nausea And Vomiting   Augmentin [Amoxicillin-Pot Clavulanate] Other (See Comments)    Gums swelling, lips numb    Current Outpatient Medications  Medication Sig Dispense Refill   acetaminophen (TYLENOL) 500 MG tablet Take 1,000 mg by mouth in the morning.     apixaban (ELIQUIS) 5 MG TABS tablet Take 1 tablet (5 mg total) by mouth 2 (two) times daily. 180 tablet 1   azithromycin (ZITHROMAX) 500 MG tablet Take 500 mg by mouth once. Before dental or surgery     benazepril-hydrochlorthiazide (LOTENSIN HCT) 20-25 MG tablet Take 1 tablet by mouth daily. 90 tablet 1   carboxymethylcellulose (REFRESH TEARS) 0.5 % SOLN Place 2 drops into both eyes See admin instructions. Instill 2 drops into both eyes scheduled every morning & as needed for dry/irritated eyes.     digoxin (LANOXIN) 0.125 MG tablet Take 1 tablet (125 mcg total) by mouth daily. 90 tablet 3   furosemide (LASIX) 20 MG tablet Take 1 tablet (20 mg total) by mouth daily as needed for edema or fluid. 90 tablet 1   levocetirizine (XYZAL) 5 MG tablet Take 1 tablet (5 mg total) by mouth every evening. 90 tablet 1   levothyroxine (SYNTHROID) 100 MCG tablet Take 1 tablet (100 mcg total) by mouth daily. 90 tablet 1   metoprolol tartrate (LOPRESSOR) 50 MG tablet Take 1 tablet (50 mg total) by mouth 2 (two) times daily. 180 tablet 3   mometasone (ELOCON) 0.1 % cream Apply 1 Application topically daily. 45 g 2   Multiple Vitamin (MULTIVITAMIN WITH MINERALS) TABS tablet Take 1 tablet by mouth every evening.     pantoprazole (PROTONIX) 40 MG tablet Take 1 tablet (40 mg total) by mouth daily. 90 tablet 1   tacrolimus (PROTOPIC) 0.1 % ointment Apply topically in the morning and at bedtime. 30 g 4   No current facility-administered medications for this visit.    Review of  Systems  Constitutional:  Positive for malaise/fatigue. Negative for weight loss.  Respiratory:  Positive for shortness of breath.   Cardiovascular:  Negative for chest pain.  Gastrointestinal:  Positive for heartburn. Negative  for abdominal pain and vomiting.     PHYSICAL EXAMINATION: BP 123/75   Pulse 78   Resp 20   Ht 5\' 4"  (1.626 m)   Wt 145 lb (65.8 kg)   SpO2 94% Comment: RA  BMI 24.89 kg/m  Physical Exam Constitutional:      General: She is not in acute distress.    Appearance: Normal appearance. She is not ill-appearing.  HENT:     Head: Normocephalic and atraumatic.  Eyes:     Extraocular Movements: Extraocular movements intact.  Cardiovascular:     Rate and Rhythm: Normal rate.  Pulmonary:     Effort: Pulmonary effort is normal. No respiratory distress.  Abdominal:     General: Abdomen is flat. There is no distension.  Musculoskeletal:        General: Normal range of motion.     Cervical back: Normal range of motion.  Skin:    General: Skin is warm and dry.  Neurological:     General: No focal deficit present.     Mental Status: She is alert and oriented to person, place, and time.     Diagnostic Studies & Laboratory data:    CT Scan:        I have independently reviewed the above radiology studies  and reviewed the findings with the patient.   Recent Lab Findings: Lab Results  Component Value Date   WBC 7.7 12/19/2022   HGB 14.4 12/19/2022   HCT 44.3 12/19/2022   PLT 206 12/19/2022   GLUCOSE 106 (H) 12/19/2022   CHOL 165 06/16/2022   TRIG 214 (H) 06/16/2022   HDL 43 06/16/2022   LDLCALC 86 06/16/2022   ALT 16 12/19/2022   AST 23 12/19/2022   NA 141 12/19/2022   K 4.2 12/19/2022   CL 101 12/19/2022   CREATININE 1.01 (H) 12/19/2022   BUN 22 12/19/2022   CO2 24 12/19/2022   TSH 0.641 08/19/2022   INR 1.1 12/10/2021   HGBA1C 6.2 (H) 06/16/2022      Assessment / Plan:   82yo female with large type IV hiatal hernia.  She has stomach  and colon in her left chest up to the level of her carina.  She is symptomatic from a respiratory standpoint, and only complains of reflux and early satiety from a digestive standpoint.  We discussed the risks and benefits of an EGD,  robotic assisted laparoscopy with hiatal hernia repair, and fundoplication.  She would like some time to consider things.  If she agrees, she will need to be off her eliquis 3 days prior to surgery.    I  spent 40 minutes with the patient face to face counseling and coordination of care.    Corliss Skains 12/23/2022 9:49 AM

## 2022-12-23 NOTE — Progress Notes (Signed)
[address]       [place] [address]             [phone]                    NESTORA INNAMORATO [facility] Medical Record #[id number] Date of Birth: Sep 30, 1940  Referring: Rollene Rotunda, MD Primary Care: [name], FNP Primary Cardiologist: Rollene Rotunda, MD  Chief Complaint:    Chief Complaint  Patient presents with   Hiatal Hernia    CT chest 10/2    History of Present Illness:    MEHWISH OLAH 82 y.o. female presents for surgical evaluation of a large type IV hiatal hernia.  She previously underwent a TAVR in 2023, but despite having improvement in her exercise tolerance, she continues to have some pronounced shortness of breath with small activities.  She is unable to walk very far, or shop in the grocery store without symptoms.  She has had persistent reflux, she has occasional dysphagia, and recently has started to develop some left back/side pain with certain meals.  She also admits to worsening shortness of breath with meals.     Zubrod Score: At the time of surgery this patient's most appropriate activity status/level should be described as: [x]     0    Normal activity, no symptoms []     1    Restricted in physical strenuous activity but ambulatory, able to do out light work []     2    Ambulatory and capable of self care, unable to do work activities, up and about               >50 % of waking hours                              []     3    Only limited self care, in bed greater than 50% of waking hours []     4    Completely disabled, no self care, confined to bed or chair []     5    Moribund   Past Medical History:  Diagnosis Date   Atrial fibrillation, persistent (HCC)    Complication of anesthesia    Hiatal hernia    Hypertension    PONV (postoperative nausea and vomiting)    S/P TAVR (transcatheter aortic valve replacement) 12/14/2021   s/p TAVR with a 23mm Edwards S3UR via the TF approach by Dr. Lynnette Caffey & Dr. Leafy Ro   Severe  aortic stenosis    Squamous cell carcinoma of skin 01/16/2018   in situ-right knee post (txpbx)   Thyroid disease     Past Surgical History:  Procedure Laterality Date   ABDOMINAL AORTOGRAM N/A 09/22/2021   Procedure: ABDOMINAL AORTOGRAM;  Surgeon: Orbie Pyo, MD;  Location: [facility] INVASIVE CV LAB;  Service: Cardiovascular;  Laterality: N/A;   CARDIAC CATHETERIZATION     EYE SURGERY     INTRAOPERATIVE TRANSTHORACIC ECHOCARDIOGRAM N/A 12/14/2021   Procedure: INTRAOPERATIVE TRANSTHORACIC ECHOCARDIOGRAM;  Surgeon: Orbie Pyo, MD;  Location: [facility] OR;  Service: Open Heart Surgery;  Laterality: N/A;   MULTIPLE EXTRACTIONS WITH ALVEOLOPLASTY N/A 12/02/2021   Procedure: MULTIPLE EXTRACTION WITH ALVEOLOPLASTY;  Surgeon: Sharman Cheek, DMD;  Location: [facility] OR;  Service: Dentistry;  Laterality: N/A;   RIGHT HEART CATH AND CORONARY/GRAFT ANGIOGRAPHY N/A 09/22/2021   Procedure: RIGHT HEART CATH AND CORONARY/GRAFT ANGIOGRAPHY;  Surgeon: Orbie Pyo, MD;  Location: [facility] INVASIVE CV LAB;  Service: Cardiovascular;  Laterality: N/A;   TOE SURGERY     TRANSCATHETER AORTIC VALVE REPLACEMENT, TRANSFEMORAL N/A 12/14/2021   Procedure: Transcatheter Aortic Valve Replacement, Transfemoral using Edwards 23 MM SAPIEN 3 Ultra;  Surgeon: Orbie Pyo, MD;  Location: [facility] OR;  Service: Open Heart Surgery;  Laterality: N/A;  Transfemoral approach   TUBAL LIGATION      Family History  Problem Relation Age of Onset   Migraines Mother    Hypertension Mother    Hyperlipidemia Mother    Heart failure Mother        Age 43   Hyperlipidemia Father    Hypertension Father    Heart disease Father        died of MI after developoing cancer.   Lung cancer Father    Hypertension Sister    Breast cancer Paternal Aunt    Migraines Maternal Grandfather    Migraines Daughter    Breast cancer Daughter      Social History   Tobacco Use  Smoking Status Never   Passive exposure: Past  Smokeless Tobacco Never     Social History   Substance and Sexual Activity  Alcohol Use No   Alcohol/week: 0.0 standard drinks of alcohol     Allergies  Allergen Reactions   Noroxin [Norfloxacin] Nausea And Vomiting   Augmentin [Amoxicillin-Pot Clavulanate] Other (See Comments)    Gums swelling, lips numb    Current Outpatient Medications  Medication Sig Dispense Refill   acetaminophen (TYLENOL) 500 MG tablet Take 1,000 mg by mouth in the morning.     apixaban (ELIQUIS) 5 MG TABS tablet Take 1 tablet (5 mg total) by mouth 2 (two) times daily. 180 tablet 1   azithromycin (ZITHROMAX) 500 MG tablet Take 500 mg by mouth once. Before dental or surgery     benazepril-hydrochlorthiazide (LOTENSIN HCT) 20-25 MG tablet Take 1 tablet by mouth daily. 90 tablet 1   carboxymethylcellulose (REFRESH TEARS) 0.5 % SOLN Place 2 drops into both eyes See admin instructions. Instill 2 drops into both eyes scheduled every morning & as needed for dry/irritated eyes.     digoxin (LANOXIN) 0.125 MG tablet Take 1 tablet (125 mcg total) by mouth daily. 90 tablet 3   furosemide (LASIX) 20 MG tablet Take 1 tablet (20 mg total) by mouth daily as needed for edema or fluid. 90 tablet 1   levocetirizine (XYZAL) 5 MG tablet Take 1 tablet (5 mg total) by mouth every evening. 90 tablet 1   levothyroxine (SYNTHROID) 100 MCG tablet Take 1 tablet (100 mcg total) by mouth daily. 90 tablet 1   metoprolol tartrate (LOPRESSOR) 50 MG tablet Take 1 tablet (50 mg total) by mouth 2 (two) times daily. 180 tablet 3   mometasone (ELOCON) 0.1 % cream Apply 1 Application topically daily. 45 g 2   Multiple Vitamin (MULTIVITAMIN WITH MINERALS) TABS tablet Take 1 tablet by mouth every evening.     pantoprazole (PROTONIX) 40 MG tablet Take 1 tablet (40 mg total) by mouth daily. 90 tablet 1   tacrolimus (PROTOPIC) 0.1 % ointment Apply topically in the morning and at bedtime. 30 g 4   No current facility-administered medications for this visit.    Review of  Systems  Constitutional:  Positive for malaise/fatigue. Negative for weight loss.  Respiratory:  Positive for shortness of breath.   Cardiovascular:  Negative for chest pain.  Gastrointestinal:  Positive for heartburn. Negative  for abdominal pain and vomiting.     PHYSICAL EXAMINATION: BP 123/75   Pulse 78   Resp 20   Ht 5\' 4"  (1.626 m)   Wt 145 lb (65.8 kg)   SpO2 94% Comment: RA  BMI 24.89 kg/m  Physical Exam Constitutional:      General: She is not in acute distress.    Appearance: Normal appearance. She is not ill-appearing.  HENT:     Head: Normocephalic and atraumatic.  Eyes:     Extraocular Movements: Extraocular movements intact.  Cardiovascular:     Rate and Rhythm: Normal rate.  Pulmonary:     Effort: Pulmonary effort is normal. No respiratory distress.  Abdominal:     General: Abdomen is flat. There is no distension.  Musculoskeletal:        General: Normal range of motion.     Cervical back: Normal range of motion.  Skin:    General: Skin is warm and dry.  Neurological:     General: No focal deficit present.     Mental Status: She is alert and oriented to person, place, and time.     Diagnostic Studies & Laboratory data:    CT Scan:        I have independently reviewed the above radiology studies  and reviewed the findings with the patient.   Recent Lab Findings: Lab Results  Component Value Date   WBC 7.7 12/19/2022   HGB 14.4 12/19/2022   HCT 44.3 12/19/2022   PLT 206 12/19/2022   GLUCOSE 106 (H) 12/19/2022   CHOL 165 06/16/2022   TRIG 214 (H) 06/16/2022   HDL 43 06/16/2022   LDLCALC 86 06/16/2022   ALT 16 12/19/2022   AST 23 12/19/2022   NA 141 12/19/2022   K 4.2 12/19/2022   CL 101 12/19/2022   CREATININE 1.01 (H) 12/19/2022   BUN 22 12/19/2022   CO2 24 12/19/2022   TSH 0.641 08/19/2022   INR 1.1 12/10/2021   HGBA1C 6.2 (H) 06/16/2022      Assessment / Plan:   82yo female with large type IV hiatal hernia.  She has stomach  and colon in her left chest up to the level of her carina.  She is symptomatic from a respiratory standpoint, and only complains of reflux and early satiety from a digestive standpoint.  We discussed the risks and benefits of an EGD,  robotic assisted laparoscopy with hiatal hernia repair, and fundoplication.  She would like some time to consider things.  If she agrees, she will need to be off her eliquis 3 days prior to surgery.    I  spent 40 minutes with the patient face to face counseling and coordination of care.    Corliss Skains 12/23/2022 9:49 AM

## 2022-12-26 ENCOUNTER — Other Ambulatory Visit: Payer: Self-pay | Admitting: *Deleted

## 2022-12-26 ENCOUNTER — Encounter: Payer: Self-pay | Admitting: *Deleted

## 2022-12-26 DIAGNOSIS — K449 Diaphragmatic hernia without obstruction or gangrene: Secondary | ICD-10-CM

## 2022-12-27 DIAGNOSIS — H02834 Dermatochalasis of left upper eyelid: Secondary | ICD-10-CM | POA: Diagnosis not present

## 2022-12-27 DIAGNOSIS — H02831 Dermatochalasis of right upper eyelid: Secondary | ICD-10-CM | POA: Diagnosis not present

## 2022-12-27 DIAGNOSIS — H04123 Dry eye syndrome of bilateral lacrimal glands: Secondary | ICD-10-CM | POA: Diagnosis not present

## 2022-12-27 DIAGNOSIS — H43813 Vitreous degeneration, bilateral: Secondary | ICD-10-CM | POA: Diagnosis not present

## 2022-12-27 DIAGNOSIS — H524 Presbyopia: Secondary | ICD-10-CM | POA: Diagnosis not present

## 2022-12-27 DIAGNOSIS — H52201 Unspecified astigmatism, right eye: Secondary | ICD-10-CM | POA: Diagnosis not present

## 2022-12-27 DIAGNOSIS — D3131 Benign neoplasm of right choroid: Secondary | ICD-10-CM | POA: Diagnosis not present

## 2022-12-30 ENCOUNTER — Telehealth: Payer: Self-pay | Admitting: Family

## 2022-12-30 MED ORDER — ATORVASTATIN CALCIUM 20 MG PO TABS: 20.0000 mg | ORAL_TABLET | Freq: Every day | ORAL | 3 refills | Status: AC

## 2022-12-30 NOTE — Telephone Encounter (Signed)
Pt wanted to let PCP know she is having hernia surgery 10/21. Pt also said she mentioned before to Morrie Sheldon C that she did not want to keep taking rosuvastatin and wanted to know if there was something else she could take?

## 2022-12-30 NOTE — Telephone Encounter (Signed)
Left detailed message per dpr  

## 2022-12-30 NOTE — Telephone Encounter (Signed)
Lipitor Prescription sent to pharmacy, low fat diet and exercise.

## 2023-01-05 ENCOUNTER — Other Ambulatory Visit (HOSPITAL_COMMUNITY): Payer: PPO

## 2023-01-05 NOTE — Pre-Procedure Instructions (Signed)
Surgical Instructions   Your procedure is scheduled on January 09, 2023. Report to [facility] Main Entrance "A" at 10:30 A.M., then check in with the Admitting office. Any questions or running late day of surgery: call [phone]  Questions prior to your surgery date: call [phone], Monday-Friday, 8am-4pm. If you experience any cold or flu symptoms such as cough, fever, chills, shortness of breath, etc. between now and your scheduled surgery, please notify us at the above number.     Remember:  Do not eat or drink after midnight the night before your surgery    Take these medicines the morning of surgery with A SIP OF WATER: acetaminophen (TYLENOL)  atorvastatin (LIPITOR)  digoxin (LANOXIN)  levothyroxine (SYNTHROID)  metoprolol tartrate (LOPRESSOR)  pantoprazole (PROTONIX)    May take these medicines IF NEEDED: carboxymethylcellulose (REFRESH TEARS) eye drops   STOP taking your apixaban (ELIQUIS) five days prior to surgery. Your last dose will be October 17th.   One week prior to surgery, STOP taking any Aspirin (unless otherwise instructed by your surgeon) Aleve, Naproxen, Ibuprofen, Motrin, Advil, Goody's, BC's, all herbal medications, fish oil, and non-prescription vitamins.                     Do NOT Smoke (Tobacco/Vaping) for 24 hours prior to your procedure.  If you use a CPAP at night, you may bring your mask/headgear for your overnight stay.   You will be asked to remove any contacts, glasses, piercing's, hearing aid's, dentures/partials prior to surgery. Please bring cases for these items if needed.    Patients discharged the day of surgery will not be allowed to drive home, and someone needs to stay with them for 24 hours.  SURGICAL WAITING ROOM VISITATION Patients may have no more than 2 support people in the waiting area - these visitors may rotate.   Pre-op nurse will coordinate an appropriate time for 1 ADULT support person, who may not rotate, to  accompany patient in pre-op.  Children under the age of 68 must have an adult with them who is not the patient and must remain in the main waiting area with an adult.  If the patient needs to stay at the hospital during part of their recovery, the visitor guidelines for inpatient rooms apply.  Please refer to the [facility] website for the visitor guidelines for any additional information.   If you received a COVID test during your pre-op visit  it is requested that you wear a mask when out in public, stay away from anyone that may not be feeling well and notify your surgeon if you develop symptoms. If you have been in contact with anyone that has tested positive in the last 10 days please notify you surgeon.      Pre-operative CHG Bathing Instructions   You can play a key role in reducing the risk of infection after surgery. Your skin needs to be as free of germs as possible. You can reduce the number of germs on your skin by washing with CHG (chlorhexidine gluconate) soap before surgery. CHG is an antiseptic soap that kills germs and continues to kill germs even after washing.   DO NOT use if you have an allergy to chlorhexidine/CHG or antibacterial soaps. If your skin becomes reddened or irritated, stop using the CHG and notify one of our RNs at [phone].              TAKE A SHOWER THE NIGHT BEFORE SURGERY AND  THE DAY OF SURGERY    Please keep in mind the following:  DO NOT shave, including legs and underarms, 48 hours prior to surgery.   You may shave your face before/day of surgery.  Place clean sheets on your bed the night before surgery Use a clean washcloth (not used since being washed) for each shower. DO NOT sleep with pet's night before surgery.  CHG Shower Instructions:  Wash your face and private area with normal soap. If you choose to wash your hair, wash first with your normal shampoo.  After you use shampoo/soap, rinse your hair and body thoroughly to remove  shampoo/soap residue.  Turn the water OFF and apply half the bottle of CHG soap to a CLEAN washcloth.  Apply CHG soap ONLY FROM YOUR NECK DOWN TO YOUR TOES (washing for 3-5 minutes)  DO NOT use CHG soap on face, private areas, open wounds, or sores.  Pay special attention to the area where your surgery is being performed.  If you are having back surgery, having someone wash your back for you may be helpful. Wait 2 minutes after CHG soap is applied, then you may rinse off the CHG soap.  Pat dry with a clean towel  Put on clean pajamas    Additional instructions for the day of surgery: DO NOT APPLY any lotions, deodorants, cologne, or perfumes.   Do not wear jewelry or makeup Do not wear nail polish, gel polish, artificial nails, or any other type of covering on natural nails (fingers and toes) Do not bring valuables to the hospital. [facility] is not responsible for valuables/personal belongings. Put on clean/comfortable clothes.  Please brush your teeth.  Ask your nurse before applying any prescription medications to the skin.

## 2023-01-06 ENCOUNTER — Encounter (HOSPITAL_COMMUNITY)
Admission: RE | Admit: 2023-01-06 | Discharge: 2023-01-06 | Disposition: A | Discharge status: Home / Self Care | Payer: PPO | Source: Ambulatory Visit | Attending: Thoracic Surgery (Cardiothoracic Vascular Surgery) | Admitting: Thoracic Surgery (Cardiothoracic Vascular Surgery)

## 2023-01-06 ENCOUNTER — Ambulatory Visit (HOSPITAL_COMMUNITY)
Admission: RE | Admit: 2023-01-06 | Discharge: 2023-01-06 | Disposition: A | Discharge status: Home / Self Care | Payer: PPO | Source: Ambulatory Visit | Attending: Thoracic Surgery (Cardiothoracic Vascular Surgery)

## 2023-01-06 ENCOUNTER — Encounter (HOSPITAL_COMMUNITY): Payer: Self-pay

## 2023-01-06 ENCOUNTER — Other Ambulatory Visit: Payer: Self-pay

## 2023-01-06 VITALS — BP 125/71 | HR 60 | Temp 97.8°F | Resp 18 | Ht 64.0 in | Wt 143.1 lb

## 2023-01-06 DIAGNOSIS — I4891 Unspecified atrial fibrillation: Secondary | ICD-10-CM | POA: Diagnosis not present

## 2023-01-06 DIAGNOSIS — I083 Combined rheumatic disorders of mitral, aortic and tricuspid valves: Secondary | ICD-10-CM | POA: Insufficient documentation

## 2023-01-06 DIAGNOSIS — Z79890 Hormone replacement therapy (postmenopausal): Secondary | ICD-10-CM | POA: Diagnosis not present

## 2023-01-06 DIAGNOSIS — I10 Essential (primary) hypertension: Secondary | ICD-10-CM | POA: Insufficient documentation

## 2023-01-06 DIAGNOSIS — Z953 Presence of xenogenic heart valve: Secondary | ICD-10-CM | POA: Diagnosis not present

## 2023-01-06 DIAGNOSIS — I700 Atherosclerosis of aorta: Secondary | ICD-10-CM | POA: Insufficient documentation

## 2023-01-06 DIAGNOSIS — I2510 Atherosclerotic heart disease of native coronary artery without angina pectoris: Secondary | ICD-10-CM | POA: Insufficient documentation

## 2023-01-06 DIAGNOSIS — K449 Diaphragmatic hernia without obstruction or gangrene: Secondary | ICD-10-CM | POA: Insufficient documentation

## 2023-01-06 DIAGNOSIS — R7303 Prediabetes: Secondary | ICD-10-CM | POA: Diagnosis not present

## 2023-01-06 DIAGNOSIS — J9811 Atelectasis: Secondary | ICD-10-CM | POA: Diagnosis not present

## 2023-01-06 DIAGNOSIS — Z1152 Encounter for screening for COVID-19: Secondary | ICD-10-CM | POA: Insufficient documentation

## 2023-01-06 DIAGNOSIS — Z01818 Encounter for other preprocedural examination: Secondary | ICD-10-CM | POA: Insufficient documentation

## 2023-01-06 DIAGNOSIS — J90 Pleural effusion, not elsewhere classified: Secondary | ICD-10-CM | POA: Diagnosis not present

## 2023-01-06 DIAGNOSIS — D72829 Elevated white blood cell count, unspecified: Secondary | ICD-10-CM | POA: Diagnosis not present

## 2023-01-06 DIAGNOSIS — E039 Hypothyroidism, unspecified: Secondary | ICD-10-CM | POA: Diagnosis not present

## 2023-01-06 DIAGNOSIS — R1310 Dysphagia, unspecified: Secondary | ICD-10-CM | POA: Diagnosis not present

## 2023-01-06 HISTORY — DX: Prediabetes: R73.03

## 2023-01-06 HISTORY — DX: Gastro-esophageal reflux disease without esophagitis: K21.9

## 2023-01-06 HISTORY — DX: Unspecified osteoarthritis, unspecified site: M19.90

## 2023-01-06 HISTORY — DX: Hypothyroidism, unspecified: E03.9

## 2023-01-06 HISTORY — DX: Cardiac arrhythmia, unspecified: I49.9

## 2023-01-06 HISTORY — DX: Cardiac murmur, unspecified: R01.1

## 2023-01-06 HISTORY — DX: Headache, unspecified: R51.9

## 2023-01-06 LAB — URINALYSIS, ROUTINE W REFLEX MICROSCOPIC
Bilirubin Urine: NEGATIVE
Glucose, UA: NEGATIVE mg/dL
Hgb urine dipstick: NEGATIVE
Ketones, ur: NEGATIVE mg/dL
Leukocytes,Ua: NEGATIVE
Nitrite: NEGATIVE
Protein, ur: NEGATIVE mg/dL
Specific Gravity, Urine: 1.02 (ref 1.005–1.030)
pH: 5 (ref 5.0–8.0)

## 2023-01-06 LAB — SARS CORONAVIRUS 2 BY RT PCR: SARS Coronavirus 2 by RT PCR: NEGATIVE

## 2023-01-06 LAB — COMPREHENSIVE METABOLIC PANEL
ALT: 22 U/L (ref 0–44)
AST: 29 U/L (ref 15–41)
Albumin: 3.8 g/dL (ref 3.5–5.0)
Alkaline Phosphatase: 50 U/L (ref 38–126)
Anion gap: 10 (ref 5–15)
BUN: 21 mg/dL (ref 8–23)
CO2: 27 mmol/L (ref 22–32)
Calcium: 9.6 mg/dL (ref 8.9–10.3)
Chloride: 101 mmol/L (ref 98–111)
Creatinine, Ser: 1.12 mg/dL — ABNORMAL HIGH (ref 0.44–1.00)
GFR, Estimated: 50 mL/min — ABNORMAL LOW (ref >60)
Glucose, Bld: 102 mg/dL — ABNORMAL HIGH (ref 70–99)
Potassium: 3.6 mmol/L (ref 3.5–5.1)
Sodium: 138 mmol/L (ref 135–145)
Total Bilirubin: 2.5 mg/dL — ABNORMAL HIGH (ref 0.3–1.2)
Total Protein: 7.6 g/dL (ref 6.5–8.1)

## 2023-01-06 LAB — TYPE AND SCREEN
ABO/RH(D): A NEG
Antibody Screen: NEGATIVE

## 2023-01-06 LAB — SURGICAL PCR SCREEN
MRSA, PCR: POSITIVE — AB
Staphylococcus aureus: POSITIVE — AB

## 2023-01-06 LAB — CBC
HCT: 44.1 % (ref 36.0–46.0)
Hemoglobin: 15 g/dL (ref 12.0–15.0)
MCH: 31.1 pg (ref 26.0–34.0)
MCHC: 34 g/dL (ref 30.0–36.0)
MCV: 91.5 fL (ref 80.0–100.0)
Platelets: 224 10*3/uL (ref 150–400)
RBC: 4.82 MIL/uL (ref 3.87–5.11)
RDW: 12.4 % (ref 11.5–15.5)
WBC: 9.6 10*3/uL (ref 4.0–10.5)
nRBC: 0 % (ref 0.0–0.2)

## 2023-01-06 LAB — PROTIME-INR
INR: 1.1 (ref 0.8–1.2)
Prothrombin Time: 14.8 s (ref 11.4–15.2)

## 2023-01-06 LAB — APTT: aPTT: 29 s (ref 24–36)

## 2023-01-06 NOTE — Plan of Care (Signed)
CHL Tonsillectomy/Adenoidectomy, Postoperative PEDS care plan entered in error.

## 2023-01-06 NOTE — Anesthesia Preprocedure Evaluation (Signed)
Anesthesia Evaluation  Patient identified by MRN, date of birth, ID band Patient awake    Reviewed: Allergy & Precautions, H&P , NPO status , Patient's Chart, lab work & pertinent test results, reviewed documented beta blocker date and time   History of Anesthesia Complications (+) PONV and history of anesthetic complications  Airway Mallampati: II  TM Distance: >3 FB Neck ROM: Full    Dental no notable dental hx. (+) Teeth Intact, Dental Advisory Given   Pulmonary neg pulmonary ROS   Pulmonary exam normal breath sounds clear to auscultation       Cardiovascular hypertension, Pt. on medications and Pt. on home beta blockers + dysrhythmias Atrial Fibrillation + Valvular Problems/Murmurs MR  Rhythm:Regular Rate:Normal     Neuro/Psych  Headaches negative neurological ROS  negative psych ROS   GI/Hepatic Neg liver ROS, hiatal hernia,GERD  Medicated,,  Endo/Other  Hypothyroidism    Renal/GU negative Renal ROS  negative genitourinary   Musculoskeletal  (+) Arthritis ,    Abdominal   Peds  Hematology  (+) Blood dyscrasia, anemia   Anesthesia Other Findings   Reproductive/Obstetrics negative OB ROS                             Anesthesia Physical Anesthesia Plan  ASA: 3  Anesthesia Plan: General   Post-op Pain Management: Ofirmev IV (intra-op)*   Induction: Intravenous  PONV Risk Score and Plan: 4 or greater and Ondansetron, Dexamethasone and Treatment may vary due to age or medical condition  Airway Management Planned: Oral ETT  Additional Equipment:   Intra-op Plan:   Post-operative Plan: Extubation in OR  Informed Consent: I have reviewed the patients History and Physical, chart, labs and discussed the procedure including the risks, benefits and alternatives for the proposed anesthesia with the patient or authorized representative who has indicated his/her understanding and  acceptance.     Dental advisory given  Plan Discussed with: CRNA  Anesthesia Plan Comments: (PAT note by Autumn Moyer, Autumn Moyer: 82 year old female follows with cardiology for history of A-fib on Eliquis and digoxin, HTN, severe AS s/p TAVR 12/14/2021.  Cath at that time showed mild nonobstructive CAD.  Follow-up echo 12/14/2022 showed EF 60%, normally functioning TAVR with a mean gradient of 15.2 mmHg, mild to moderate MR, moderate to severe TR.  Last seen by Autumn Moyer, Autumn Moyer 12/14/2022 and discussed upcoming hiatal hernia repair.  Per note, "Pt saw Autumn Moyer and is set up for robotic assisted hiatal hernia repair with fundoplication on 01/09/23. Pre op clearance provided below.  Autumn Moyer perioperative risk of a major cardiac event is 0.9% according to the Revised Cardiac Risk Index (RCRI).  Therefore, she is at low risk for perioperative complications.   Her functional capacity is excellent at 7.01 METs according to the Duke Activity Status Index (DASI). Recommendations: According to ACC/AHA guidelines, no further cardiovascular testing needed.  The patient may proceed to surgery at acceptable risk. Antiplatelet and/or Anticoagulation Recommendations: Eliquis (Apixaban) can be held for 2 days prior to surgery.  Please resume post op when felt to be safe.  This was discussed with Autumn Moyer Autumn Moyer in clinic today."  Other pertinent history includes postoperative nausea and vomiting, migraines, GERD, hypothyroid.  Preop labs reviewed, unremarkable.  EKG 01/06/2023: Atrial fibrillation with premature ventricular or aberrantly conducted complexes.  Rate 72.  CT chest 12/21/22: IMPRESSION: 1. Since 10/01/2021, similar appearance of hiatal hernia containing the majority of the stomach and nonobstructive  splenic flexure of the colon. 2. Interval TAVR. 3. Incidental findings, including: Coronary artery atherosclerosis. Aortic Atherosclerosis (ICD10-I70.0).  TTE 12/14/2022: 1. Left  ventricular ejection fraction, by estimation, is 60 to 65%. The  left ventricle has normal function. The left ventricle has no regional  wall motion abnormalities. Left ventricular diastolic parameters are  indeterminate.  2. Right ventricular systolic function is normal. The right ventricular  size is normal.  3. The mitral valve is normal in structure. Mild to moderate mitral valve  regurgitation. No evidence of mitral stenosis.  4. Tricuspid valve regurgitation is moderate to severe.  5. Well-seated bioprosthetic valve. The aortic valve has been  repaired/replaced. Aortic valve regurgitation is trivial. No aortic  stenosis is present. There is a 23 mm Sapien prosthetic (TAVR) valve  present in the aortic position. Aortic valve mean  gradient measures 15.2 mmHg. Aortic valve Vmax measures 2.43 m/s.  6. The inferior vena cava is normal in size with greater than 50%  respiratory variability, suggesting right atrial pressure of 3 mmHg.   Cath 09/22/2021:    Prox LAD lesion is 20% stenosed.  1.  Mild obstructive coronary artery disease 2.  Cardiac output of 9.8 L/min and cardiac index of 5.4 L/min/m with mean RA pressure of 8 mmHg, mean wedge pressure of 16 mmHg, and mean PA pressure of 26 mmHg. 3.  Capacious iliofemoral vessels bilaterally with femoral bifurcations well below the femoral heads.  Recommendation: Continue evaluation for aortic valve intervention.  )        Anesthesia Quick Evaluation

## 2023-01-06 NOTE — Progress Notes (Addendum)
Anesthesia Chart Review:  82 year old female follows with cardiology for history of A-fib on Eliquis and digoxin, HTN, severe AS s/p TAVR 12/14/2021.  Cath at that time showed mild nonobstructive CAD.  Follow-up echo 12/14/2022 showed EF 60%, normally functioning TAVR with a mean gradient of 15.2 mmHg, mild to moderate MR, moderate to severe TR.  Last seen by Cline Crock, PA-C 12/14/2022 and discussed upcoming hiatal hernia repair.  Per note, "Pt saw Dr Cliffton Asters and is set up for robotic assisted hiatal hernia repair with fundoplication on 01/09/23. Pre op clearance provided below.  Ms. Gutter perioperative risk of a major cardiac event is 0.9% according to the Revised Cardiac Risk Index (RCRI).  Therefore, she is at low risk for perioperative complications.   Her functional capacity is excellent at 7.01 METs according to the Duke Activity Status Index (DASI). Recommendations: According to ACC/AHA guidelines, no further cardiovascular testing needed.  The patient may proceed to surgery at acceptable risk. Antiplatelet and/or Anticoagulation Recommendations: Eliquis (Apixaban) can be held for 2 days prior to surgery.  Please resume post op when felt to be safe.  This was discussed with Carmela Hurt [facility] in clinic today."  Other pertinent history includes postoperative nausea and vomiting, migraines, GERD, hypothyroid.  Preop labs reviewed, unremarkable.  EKG 01/06/2023: Atrial fibrillation with premature ventricular or aberrantly conducted complexes.  Rate 72.  CT chest 12/21/22: IMPRESSION: 1. Since 10/01/2021, similar appearance of hiatal hernia containing the majority of the stomach and nonobstructive splenic flexure of the colon. 2. Interval TAVR. 3. Incidental findings, including: Coronary artery atherosclerosis. Aortic Atherosclerosis (ICD10-I70.0).  TTE 12/14/2022:  1. Left ventricular ejection fraction, by estimation, is 60 to 65%. The  left ventricle has normal function. The left  ventricle has no regional  wall motion abnormalities. Left ventricular diastolic parameters are  indeterminate.   2. Right ventricular systolic function is normal. The right ventricular  size is normal.   3. The mitral valve is normal in structure. Mild to moderate mitral valve  regurgitation. No evidence of mitral stenosis.   4. Tricuspid valve regurgitation is moderate to severe.   5. Well-seated bioprosthetic valve. The aortic valve has been  repaired/replaced. Aortic valve regurgitation is trivial. No aortic  stenosis is present. There is a 23 mm Sapien prosthetic (TAVR) valve  present in the aortic position. Aortic valve mean  gradient measures 15.2 mmHg. Aortic valve Vmax measures 2.43 m/s.   6. The inferior vena cava is normal in size with greater than 50%  respiratory variability, suggesting right atrial pressure of 3 mmHg.   Cath 09/22/2021:    Prox LAD lesion is 20% stenosed.   1.  Mild obstructive coronary artery disease 2.  Cardiac output of 9.8 L/min and cardiac index of 5.4 L/min/m with mean RA pressure of 8 mmHg, mean wedge pressure of 16 mmHg, and mean PA pressure of 26 mmHg. 3.  Capacious iliofemoral vessels bilaterally with femoral bifurcations well below the femoral heads.   Recommendation: Continue evaluation for aortic valve intervention.   Zannie Cove [facility] Short Stay Center/Anesthesiology Phone [phone] 01/06/2023 4:11 PM

## 2023-01-06 NOTE — Progress Notes (Signed)
Darius Bump, RN with Dr. Lucilla Lame office made aware of Surgical PCR result +MRSA and +MSSA

## 2023-01-06 NOTE — Progress Notes (Signed)
PCP - Jannifer Rodney, FNP Cardiologist - Dr. Rollene Rotunda - Last office visit 12/14/2022  PPM/ICD - Denies Device Orders - n/a Rep Notified - n/a  Chest x-ray - 01/06/2023 EKG - 01/06/2023 Stress Test - 04/24/2020 ECHO - 12/14/2022 Cardiac Cath - 12/14/2021  Sleep Study - Denies CPAP - n/a  No DM  Last dose of GLP1 agonist- n/a GLP1 instructions: n/a  Blood Thinner Instructions: Pt instructed to hold Eliquis for 5 days. Last dose was October 17th Aspirin Instructions: n/a  NPO after midnight  COVID TEST- Yes. Result Pending   Anesthesia review: Yes. Cardiac Clearance obtained. Pt had TAVR last year and states she still has some shortness of breath, but Dr. Cliffton Asters attributes this to the hiatal hernia.  Patient denies shortness of breath, fever, cough and chest pain at PAT appointment. Pt denies any respiratory illness/infection in the last two months.   All instructions explained to the patient, with a verbal understanding of the material. Patient agrees to go over the instructions while at home for a better understanding. Patient also instructed to self quarantine after being tested for COVID-19. The opportunity to ask questions was provided.

## 2023-01-09 ENCOUNTER — Inpatient Hospital Stay (HOSPITAL_COMMUNITY)
Admission: RE | Admit: 2023-01-09 | Discharge: 2023-01-12 | DRG: 327 | Disposition: A | Discharge status: Home / Self Care | Payer: PPO | Attending: Thoracic Surgery (Cardiothoracic Vascular Surgery) | Admitting: Thoracic Surgery (Cardiothoracic Vascular Surgery)

## 2023-01-09 ENCOUNTER — Other Ambulatory Visit: Payer: Self-pay

## 2023-01-09 ENCOUNTER — Encounter (HOSPITAL_COMMUNITY): Payer: Self-pay | Admitting: Thoracic Surgery (Cardiothoracic Vascular Surgery)

## 2023-01-09 ENCOUNTER — Inpatient Hospital Stay (HOSPITAL_COMMUNITY): Payer: PPO

## 2023-01-09 ENCOUNTER — Inpatient Hospital Stay (HOSPITAL_COMMUNITY): Payer: PPO | Admitting: Physician Assistant

## 2023-01-09 ENCOUNTER — Encounter (HOSPITAL_COMMUNITY)
Admission: RE | Disposition: A | Discharge status: Home / Self Care | Payer: Self-pay | Source: Home / Self Care | Attending: Thoracic Surgery (Cardiothoracic Vascular Surgery)

## 2023-01-09 ENCOUNTER — Inpatient Hospital Stay (HOSPITAL_COMMUNITY): Payer: Self-pay | Admitting: Certified Registered Nurse Anesthetist

## 2023-01-09 DIAGNOSIS — R519 Headache, unspecified: Secondary | ICD-10-CM | POA: Diagnosis not present

## 2023-01-09 DIAGNOSIS — D72829 Elevated white blood cell count, unspecified: Secondary | ICD-10-CM | POA: Diagnosis not present

## 2023-01-09 DIAGNOSIS — E039 Hypothyroidism, unspecified: Secondary | ICD-10-CM | POA: Diagnosis present

## 2023-01-09 DIAGNOSIS — R918 Other nonspecific abnormal finding of lung field: Secondary | ICD-10-CM | POA: Diagnosis not present

## 2023-01-09 DIAGNOSIS — Z83438 Family history of other disorder of lipoprotein metabolism and other lipidemia: Secondary | ICD-10-CM

## 2023-01-09 DIAGNOSIS — K449 Diaphragmatic hernia without obstruction or gangrene: Principal | ICD-10-CM | POA: Diagnosis present

## 2023-01-09 DIAGNOSIS — Z79890 Hormone replacement therapy (postmenopausal): Secondary | ICD-10-CM | POA: Diagnosis not present

## 2023-01-09 DIAGNOSIS — Z8249 Family history of ischemic heart disease and other diseases of the circulatory system: Secondary | ICD-10-CM | POA: Diagnosis not present

## 2023-01-09 DIAGNOSIS — K440 Diaphragmatic hernia with obstruction, without gangrene: Secondary | ICD-10-CM | POA: Diagnosis not present

## 2023-01-09 DIAGNOSIS — Z85828 Personal history of other malignant neoplasm of skin: Secondary | ICD-10-CM

## 2023-01-09 DIAGNOSIS — Z801 Family history of malignant neoplasm of trachea, bronchus and lung: Secondary | ICD-10-CM | POA: Diagnosis not present

## 2023-01-09 DIAGNOSIS — Z888 Allergy status to other drugs, medicaments and biological substances status: Secondary | ICD-10-CM | POA: Diagnosis not present

## 2023-01-09 DIAGNOSIS — Z803 Family history of malignant neoplasm of breast: Secondary | ICD-10-CM | POA: Diagnosis not present

## 2023-01-09 DIAGNOSIS — J90 Pleural effusion, not elsewhere classified: Secondary | ICD-10-CM | POA: Diagnosis not present

## 2023-01-09 DIAGNOSIS — I2510 Atherosclerotic heart disease of native coronary artery without angina pectoris: Secondary | ICD-10-CM | POA: Diagnosis not present

## 2023-01-09 DIAGNOSIS — I4891 Unspecified atrial fibrillation: Secondary | ICD-10-CM | POA: Diagnosis present

## 2023-01-09 DIAGNOSIS — J9811 Atelectasis: Secondary | ICD-10-CM | POA: Diagnosis not present

## 2023-01-09 DIAGNOSIS — R7303 Prediabetes: Secondary | ICD-10-CM | POA: Diagnosis not present

## 2023-01-09 DIAGNOSIS — R1310 Dysphagia, unspecified: Secondary | ICD-10-CM | POA: Diagnosis present

## 2023-01-09 DIAGNOSIS — R079 Chest pain, unspecified: Secondary | ICD-10-CM | POA: Diagnosis not present

## 2023-01-09 DIAGNOSIS — Z79899 Other long term (current) drug therapy: Secondary | ICD-10-CM | POA: Diagnosis not present

## 2023-01-09 DIAGNOSIS — Z1152 Encounter for screening for COVID-19: Secondary | ICD-10-CM

## 2023-01-09 DIAGNOSIS — Z01818 Encounter for other preprocedural examination: Secondary | ICD-10-CM | POA: Diagnosis not present

## 2023-01-09 DIAGNOSIS — Z953 Presence of xenogenic heart valve: Secondary | ICD-10-CM

## 2023-01-09 DIAGNOSIS — I10 Essential (primary) hypertension: Secondary | ICD-10-CM | POA: Diagnosis present

## 2023-01-09 DIAGNOSIS — K219 Gastro-esophageal reflux disease without esophagitis: Secondary | ICD-10-CM | POA: Diagnosis present

## 2023-01-09 DIAGNOSIS — Z9889 Other specified postprocedural states: Principal | ICD-10-CM

## 2023-01-09 DIAGNOSIS — Z7901 Long term (current) use of anticoagulants: Secondary | ICD-10-CM | POA: Diagnosis not present

## 2023-01-09 HISTORY — PX: ESOPHAGOGASTRODUODENOSCOPY: SHX5428

## 2023-01-09 HISTORY — PX: XI ROBOTIC ASSISTED HIATAL HERNIA REPAIR: SHX6889

## 2023-01-09 LAB — CBC
HCT: 42 % (ref 36.0–46.0)
Hemoglobin: 14.3 g/dL (ref 12.0–15.0)
MCH: 30.7 pg (ref 26.0–34.0)
MCHC: 34 g/dL (ref 30.0–36.0)
MCV: 90.1 fL (ref 80.0–100.0)
Platelets: 195 10*3/uL (ref 150–400)
RBC: 4.66 MIL/uL (ref 3.87–5.11)
RDW: 12.6 % (ref 11.5–15.5)
WBC: 19 10*3/uL — ABNORMAL HIGH (ref 4.0–10.5)
nRBC: 0 % (ref 0.0–0.2)

## 2023-01-09 LAB — CREATININE, SERUM
Creatinine, Ser: 0.98 mg/dL (ref 0.44–1.00)
GFR, Estimated: 58 mL/min — ABNORMAL LOW (ref >60)

## 2023-01-09 SURGERY — REPAIR, HERNIA, HIATAL, ROBOT-ASSISTED
Anesthesia: General | Site: Chest

## 2023-01-09 MED ORDER — PROPOFOL 10 MG/ML IV BOLUS
INTRAVENOUS | Status: AC
  Filled 2023-01-09: qty 20

## 2023-01-09 MED ORDER — EPHEDRINE 5 MG/ML INJ
INTRAVENOUS | Status: AC
  Filled 2023-01-09: qty 5

## 2023-01-09 MED ORDER — BUPIVACAINE HCL (PF) 0.5 % IJ SOLN
INTRAMUSCULAR | Status: AC
  Filled 2023-01-09: qty 30

## 2023-01-09 MED ORDER — BISACODYL 10 MG RE SUPP: 10.0000 mg | Freq: Every day | RECTAL | Status: DC | PRN

## 2023-01-09 MED ORDER — MUPIROCIN 2 % EX OINT
1.0000 | TOPICAL_OINTMENT | Freq: Two times a day (BID) | CUTANEOUS | Status: DC
  Administered 2023-01-10 – 2023-01-12 (×5): 1 via NASAL
  Filled 2023-01-09: qty 22

## 2023-01-09 MED ORDER — VANCOMYCIN HCL IN DEXTROSE 1-5 GM/200ML-% IV SOLN
1000.0000 mg | Freq: Once | INTRAVENOUS | Status: AC
  Administered 2023-01-09: 1000 mg via INTRAVENOUS
  Filled 2023-01-09: qty 200

## 2023-01-09 MED ORDER — LACTATED RINGERS IV SOLN: INTRAVENOUS | Status: DC

## 2023-01-09 MED ORDER — FENTANYL CITRATE (PF) 100 MCG/2ML IJ SOLN
INTRAMUSCULAR | Status: AC
  Filled 2023-01-09: qty 2

## 2023-01-09 MED ORDER — ACETAMINOPHEN 10 MG/ML IV SOLN
1000.0000 mg | Freq: Four times a day (QID) | INTRAVENOUS | Status: DC
  Administered 2023-01-09: 1000 mg via INTRAVENOUS
  Filled 2023-01-09 (×3): qty 100

## 2023-01-09 MED ORDER — ROCURONIUM BROMIDE 10 MG/ML (PF) SYRINGE
PREFILLED_SYRINGE | INTRAVENOUS | Status: AC
  Filled 2023-01-09: qty 10

## 2023-01-09 MED ORDER — SODIUM CHLORIDE 0.45 % IV SOLN: INTRAVENOUS | Status: DC

## 2023-01-09 MED ORDER — ROCURONIUM BROMIDE 10 MG/ML (PF) SYRINGE
PREFILLED_SYRINGE | INTRAVENOUS | Status: DC | PRN
  Administered 2023-01-09: 70 mg via INTRAVENOUS
  Administered 2023-01-09: 10 mg via INTRAVENOUS
  Administered 2023-01-09: 20 mg via INTRAVENOUS

## 2023-01-09 MED ORDER — DEXAMETHASONE SODIUM PHOSPHATE 10 MG/ML IJ SOLN
INTRAMUSCULAR | Status: DC | PRN
  Administered 2023-01-09: 5 mg via INTRAVENOUS

## 2023-01-09 MED ORDER — ONDANSETRON HCL 4 MG/2ML IJ SOLN
4.0000 mg | Freq: Four times a day (QID) | INTRAMUSCULAR | Status: DC | PRN
  Administered 2023-01-09 – 2023-01-11 (×3): 4 mg via INTRAVENOUS
  Filled 2023-01-09 (×3): qty 2

## 2023-01-09 MED ORDER — VANCOMYCIN HCL IN DEXTROSE 1-5 GM/200ML-% IV SOLN
1000.0000 mg | INTRAVENOUS | Status: AC
  Administered 2023-01-09: 1000 mg via INTRAVENOUS
  Filled 2023-01-09: qty 200

## 2023-01-09 MED ORDER — ORAL CARE MOUTH RINSE: 15.0000 mL | Freq: Once | OROMUCOSAL | Status: AC

## 2023-01-09 MED ORDER — ENOXAPARIN SODIUM 40 MG/0.4ML IJ SOSY
40.0000 mg | PREFILLED_SYRINGE | INTRAMUSCULAR | Status: DC
  Administered 2023-01-10 – 2023-01-12 (×3): 40 mg via SUBCUTANEOUS
  Filled 2023-01-09 (×3): qty 0.4

## 2023-01-09 MED ORDER — FENTANYL CITRATE (PF) 250 MCG/5ML IJ SOLN
INTRAMUSCULAR | Status: AC
  Filled 2023-01-09: qty 5

## 2023-01-09 MED ORDER — FENTANYL CITRATE (PF) 250 MCG/5ML IJ SOLN
INTRAMUSCULAR | Status: DC | PRN
  Administered 2023-01-09 (×4): 50 ug via INTRAVENOUS

## 2023-01-09 MED ORDER — SUGAMMADEX SODIUM 200 MG/2ML IV SOLN
INTRAVENOUS | Status: DC | PRN
  Administered 2023-01-09: 200 mg via INTRAVENOUS

## 2023-01-09 MED ORDER — PHENYLEPHRINE HCL-NACL 20-0.9 MG/250ML-% IV SOLN
INTRAVENOUS | Status: DC | PRN
  Administered 2023-01-09: 25 ug/min via INTRAVENOUS

## 2023-01-09 MED ORDER — BUPIVACAINE LIPOSOME 1.3 % IJ SUSP
INTRAMUSCULAR | Status: AC
  Filled 2023-01-09: qty 20

## 2023-01-09 MED ORDER — MORPHINE SULFATE (PF) 2 MG/ML IV SOLN
2.0000 mg | INTRAVENOUS | Status: DC | PRN
  Administered 2023-01-09 – 2023-01-11 (×6): 2 mg via INTRAVENOUS
  Filled 2023-01-09 (×9): qty 1

## 2023-01-09 MED ORDER — CHLORHEXIDINE GLUCONATE 0.12 % MT SOLN
15.0000 mL | Freq: Once | OROMUCOSAL | Status: AC
  Administered 2023-01-09: 15 mL via OROMUCOSAL
  Filled 2023-01-09: qty 15

## 2023-01-09 MED ORDER — 0.9 % SODIUM CHLORIDE (POUR BTL) OPTIME
TOPICAL | Status: DC | PRN
  Administered 2023-01-09: 1000 mL

## 2023-01-09 MED ORDER — PROPOFOL 10 MG/ML IV BOLUS
INTRAVENOUS | Status: DC | PRN
  Administered 2023-01-09: 180 mg via INTRAVENOUS

## 2023-01-09 MED ORDER — CHLORHEXIDINE GLUCONATE CLOTH 2 % EX PADS
6.0000 | MEDICATED_PAD | Freq: Every day | CUTANEOUS | Status: DC
  Administered 2023-01-10 – 2023-01-12 (×3): 6 via TOPICAL

## 2023-01-09 MED ORDER — BUPIVACAINE LIPOSOME 1.3 % IJ SUSP
INTRAMUSCULAR | Status: DC | PRN
  Administered 2023-01-09: 50 mL

## 2023-01-09 MED ORDER — DIPHENHYDRAMINE HCL 50 MG/ML IJ SOLN
INTRAMUSCULAR | Status: DC | PRN
  Administered 2023-01-09: 12.5 mg via INTRAVENOUS

## 2023-01-09 MED ORDER — ONDANSETRON HCL 4 MG/2ML IJ SOLN
4.0000 mg | Freq: Once | INTRAMUSCULAR | Status: AC
  Administered 2023-01-09: 4 mg via INTRAVENOUS

## 2023-01-09 MED ORDER — PHENYLEPHRINE 80 MCG/ML (10ML) SYRINGE FOR IV PUSH (FOR BLOOD PRESSURE SUPPORT)
PREFILLED_SYRINGE | INTRAVENOUS | Status: DC | PRN
  Administered 2023-01-09 (×2): 160 ug via INTRAVENOUS

## 2023-01-09 MED ORDER — AMISULPRIDE (ANTIEMETIC) 5 MG/2ML IV SOLN
10.0000 mg | Freq: Once | INTRAVENOUS | Status: AC
  Administered 2023-01-09: 10 mg via INTRAVENOUS

## 2023-01-09 MED ORDER — DEXAMETHASONE SODIUM PHOSPHATE 10 MG/ML IJ SOLN
INTRAMUSCULAR | Status: AC
  Filled 2023-01-09: qty 1

## 2023-01-09 MED ORDER — ACETAMINOPHEN 10 MG/ML IV SOLN
INTRAVENOUS | Status: DC | PRN
  Administered 2023-01-09: 1000 mg via INTRAVENOUS

## 2023-01-09 MED ORDER — HYDRALAZINE HCL 20 MG/ML IJ SOLN: 10.0000 mg | INTRAMUSCULAR | Status: AC | PRN

## 2023-01-09 MED ORDER — FENTANYL CITRATE (PF) 100 MCG/2ML IJ SOLN
25.0000 ug | INTRAMUSCULAR | Status: DC | PRN
Start: 2023-01-09 — End: 2023-01-09
  Administered 2023-01-09 (×2): 25 ug via INTRAVENOUS
  Administered 2023-01-09: 50 ug via INTRAVENOUS

## 2023-01-09 MED ORDER — ONDANSETRON HCL 4 MG/2ML IJ SOLN
INTRAMUSCULAR | Status: AC
  Filled 2023-01-09: qty 2

## 2023-01-09 MED ORDER — LIDOCAINE 2% (20 MG/ML) 5 ML SYRINGE
INTRAMUSCULAR | Status: DC | PRN
  Administered 2023-01-09: 60 mg via INTRAVENOUS

## 2023-01-09 MED ORDER — ACETAMINOPHEN 10 MG/ML IV SOLN
INTRAVENOUS | Status: AC
  Filled 2023-01-09: qty 100

## 2023-01-09 MED ORDER — AMISULPRIDE (ANTIEMETIC) 5 MG/2ML IV SOLN
INTRAVENOUS | Status: AC
  Filled 2023-01-09: qty 4

## 2023-01-09 MED ORDER — EPHEDRINE SULFATE-NACL 50-0.9 MG/10ML-% IV SOSY
PREFILLED_SYRINGE | INTRAVENOUS | Status: DC | PRN
  Administered 2023-01-09 (×2): 5 mg via INTRAVENOUS

## 2023-01-09 MED ORDER — PANTOPRAZOLE SODIUM 40 MG IV SOLR
40.0000 mg | Freq: Every day | INTRAVENOUS | Status: DC
Start: 2023-01-09 — End: 2023-01-12
  Administered 2023-01-09 – 2023-01-11 (×3): 40 mg via INTRAVENOUS
  Filled 2023-01-09 (×3): qty 10

## 2023-01-09 MED ORDER — ONDANSETRON HCL 4 MG/2ML IJ SOLN: 4.0000 mg | Freq: Four times a day (QID) | INTRAMUSCULAR | Status: DC

## 2023-01-09 SURGICAL SUPPLY — 72 items
ADH SKN CLS APL DERMABOND .7 (GAUZE/BANDAGES/DRESSINGS) ×2
BLADE SURG 11 STRL SS (BLADE) ×2 IMPLANT
BUTTON OLYMPUS DEFENDO 5 PIECE (MISCELLANEOUS) ×2 IMPLANT
CANISTER SUCT 3000ML PPV (MISCELLANEOUS) ×4 IMPLANT
CATH THORACIC 28FR (CATHETERS) IMPLANT
CNTNR URN SCR LID CUP LEK RST (MISCELLANEOUS) ×2 IMPLANT
CONT SPEC 4OZ STRL OR WHT (MISCELLANEOUS) ×2
DEFOGGER SCOPE WARMER CLEARIFY (MISCELLANEOUS) ×2 IMPLANT
DERMABOND ADVANCED .7 DNX12 (GAUZE/BANDAGES/DRESSINGS) ×2 IMPLANT
DEVICE SUTURE ENDOST 10MM (ENDOMECHANICALS) IMPLANT
DRAIN PENROSE 12X.25 LTX STRL (MISCELLANEOUS) IMPLANT
DRAPE ARM DVNC X/XI (DISPOSABLE) ×8 IMPLANT
DRAPE COLUMN DVNC XI (DISPOSABLE) ×2 IMPLANT
DRAPE CV SPLIT W-CLR ANES SCRN (DRAPES) ×2 IMPLANT
DRAPE INCISE IOBAN 66X45 STRL (DRAPES) IMPLANT
DRAPE ORTHO SPLIT 77X108 STRL (DRAPES) ×2
DRAPE SURG ORHT 6 SPLT 77X108 (DRAPES) ×2 IMPLANT
DRIVER NDL MEGA 8 DVNC XI (INSTRUMENTS) IMPLANT
DRIVER NDL MEGA SUTCUT DVNCXI (INSTRUMENTS) IMPLANT
DRIVER NDLE MEGA DVNC XI (INSTRUMENTS) ×2 IMPLANT
DRIVER NDLE MEGA SUTCUT DVNCXI (INSTRUMENTS) ×2 IMPLANT
ELECT REM PT RETURN 9FT ADLT (ELECTROSURGICAL) ×2
ELECTRODE REM PT RTRN 9FT ADLT (ELECTROSURGICAL) ×2 IMPLANT
FELT TEFLON 1X6 (MISCELLANEOUS) IMPLANT
FORCEPS BPLR LNG DVNC XI (INSTRUMENTS) IMPLANT
FORCEPS CADIERE DVNC XI (FORCEP) IMPLANT
GAUZE SPONGE 4X4 12PLY STRL (GAUZE/BANDAGES/DRESSINGS) ×2 IMPLANT
GLOVE BIO SURGEON STRL SZ7 (GLOVE) ×2 IMPLANT
GLOVE BIO SURGEON STRL SZ7.5 (GLOVE) ×6 IMPLANT
GOWN STRL REUS W/ TWL LRG LVL3 (GOWN DISPOSABLE) ×2 IMPLANT
GOWN STRL REUS W/ TWL XL LVL3 (GOWN DISPOSABLE) ×4 IMPLANT
GOWN STRL REUS W/TWL 2XL LVL3 (GOWN DISPOSABLE) ×2 IMPLANT
GOWN STRL REUS W/TWL LRG LVL3 (GOWN DISPOSABLE) ×2
GOWN STRL REUS W/TWL XL LVL3 (GOWN DISPOSABLE) ×4
GRASPER SUT TROCAR 14GX15 (MISCELLANEOUS) IMPLANT
GRASPER TIP-UP FEN DVNC XI (INSTRUMENTS) IMPLANT
IV NS 1000ML (IV SOLUTION)
IV NS 1000ML BAXH (IV SOLUTION) IMPLANT
KIT BASIN OR (CUSTOM PROCEDURE TRAY) ×2 IMPLANT
KIT TURNOVER KIT B (KITS) ×2 IMPLANT
MARKER SKIN DUAL TIP RULER LAB (MISCELLANEOUS) ×2 IMPLANT
MESH OVITEX 1S RESORB 6X10 6L (Mesh General) IMPLANT
NS IRRIG 1000ML POUR BTL (IV SOLUTION) ×4 IMPLANT
OBTURATOR OPTICAL STND 8 DVNC (TROCAR) ×2
OBTURATOR OPTICALSTD 8 DVNC (TROCAR) ×2 IMPLANT
OIL SILICONE PENTAX (PARTS (SERVICE/REPAIRS)) IMPLANT
PACK CHEST (CUSTOM PROCEDURE TRAY) ×2 IMPLANT
PAD ARMBOARD 7.5X6 YLW CONV (MISCELLANEOUS) ×4 IMPLANT
PORT ACCESS TROCAR AIRSEAL 12 (TROCAR) IMPLANT
SEAL UNIV 5-12 XI (MISCELLANEOUS) ×8 IMPLANT
SEALER SYNCHRO 8 IS4000 DVNC (MISCELLANEOUS) IMPLANT
SET TRI-LUMEN FLTR TB AIRSEAL (TUBING) IMPLANT
SUT ETHIBOND 0 36 GRN (SUTURE) ×4 IMPLANT
SUT SILK 1 MH (SUTURE) ×2 IMPLANT
SUT SURGIDAC NAB ES-9 0 48 120 (SUTURE) IMPLANT
SUT VIC AB 2-0 CT1 27 (SUTURE) ×2
SUT VIC AB 2-0 CT1 TAPERPNT 27 (SUTURE) ×2 IMPLANT
SUT VIC AB 3-0 SH 27 (SUTURE) ×4
SUT VIC AB 3-0 SH 27X BRD (SUTURE) ×4 IMPLANT
SUT VICRYL 0 UR6 27IN ABS (SUTURE) ×4 IMPLANT
SYR 20ML ECCENTRIC (SYRINGE) ×2 IMPLANT
SYSTEM SAHARA CHEST DRAIN ATS (WOUND CARE) IMPLANT
TOWEL GREEN STERILE (TOWEL DISPOSABLE) ×2 IMPLANT
TOWEL GREEN STERILE FF (TOWEL DISPOSABLE) ×2 IMPLANT
TRAY FOLEY MTR SLVR 16FR STAT (SET/KITS/TRAYS/PACK) ×2 IMPLANT
TROCAR PORT AIRSEAL 8X120 (TROCAR) IMPLANT
TROCAR XCEL BLADELESS 5X75MML (TROCAR) ×2 IMPLANT
TROCAR XCEL NON-BLD 5MMX100MML (ENDOMECHANICALS) IMPLANT
TUBE CONNECTING 20X1/4 (TUBING) ×2 IMPLANT
TUBING ENDO SMARTCAP (MISCELLANEOUS) ×2 IMPLANT
UNDERPAD 30X36 HEAVY ABSORB (UNDERPADS AND DIAPERS) ×2 IMPLANT
WATER STERILE IRR 1000ML POUR (IV SOLUTION) ×2 IMPLANT

## 2023-01-09 NOTE — Brief Op Note (Signed)
01/09/2023  2:58 PM  PATIENT:  Autumn Moyer  82 y.o. female  PRE-OPERATIVE DIAGNOSIS:  LARGE HIATAL HERNIA  POST-OPERATIVE DIAGNOSIS:  LARGE HIATAL HERNIA  PROCEDURE:  Procedure(s): XI ROBOTIC ASSISTED HIATAL HERNIA REPAIR WITH FUNDOPLICATION (N/A) ESOPHAGOGASTRODUODENOSCOPY (EGD) (N/A)  SURGEON:  Surgeons and Role:    * Corliss Skains, MD - Primary  PHYSICIAN ASSISTANT: Aloha Gell PA-C, Gershon Crane PA-C  ASSISTANTS: none   ANESTHESIA:   local and general  EBL:  {None/Minimal: 21241}   BLOOD ADMINISTERED:none  DRAINS: {Devices; drains:31758}   LOCAL MEDICATIONS USED:  OTHER exparel  SPECIMEN:  No Specimen  DISPOSITION OF SPECIMEN:  N/A  COUNTS:  YES  TOURNIQUET:  * No tourniquets in log *  DICTATION: .Dragon Dictation  PLAN OF CARE: Admit to inpatient   PATIENT DISPOSITION:  PACU - hemodynamically stable.   Delay start of Pharmacological VTE agent (>24hrs) due to surgical blood loss or risk of bleeding: no

## 2023-01-09 NOTE — Anesthesia Procedure Notes (Signed)
Procedure Name: Intubation Date/Time: 01/09/2023 12:42 PM  Performed by: Dairl Ponder, CRNAPre-anesthesia Checklist: Patient identified, Emergency Drugs available, Suction available and Patient being monitored Patient Re-evaluated:Patient Re-evaluated prior to induction Oxygen Delivery Method: Circle System Utilized Preoxygenation: Pre-oxygenation with 100% oxygen Induction Type: IV induction Ventilation: Mask ventilation without difficulty Laryngoscope Size: Mac and 3 Grade View: Grade I Tube type: Oral Tube size: 7.0 mm Number of attempts: 1 Airway Equipment and Method: Stylet and Oral airway Placement Confirmation: ETT inserted through vocal cords under direct vision, positive ETCO2 and breath sounds checked- equal and bilateral Secured at: 22 cm Tube secured with: Tape Dental Injury: Teeth and Oropharynx as per pre-operative assessment

## 2023-01-09 NOTE — Transfer of Care (Signed)
Immediate Anesthesia Transfer of Care Note  Patient: Autumn Moyer  Procedure(s) Performed: XI ROBOTIC ASSISTED HIATAL HERNIA REPAIR WITH FUNDOPLICATION (Chest) ESOPHAGOGASTRODUODENOSCOPY (EGD)  Patient Location: PACU  Anesthesia Type:General  Level of Consciousness: awake  Airway & Oxygen Therapy: Patient Spontanous Breathing and Patient connected to nasal cannula oxygen  Post-op Assessment: Report given to RN and Post -op Vital signs reviewed and stable  Post vital signs: Reviewed and stable  Last Vitals:  Vitals Value Taken Time  BP 103/66 01/09/23 1546  Temp    Pulse 73 01/09/23 1550  Resp 16 01/09/23 1550  SpO2 95 % 01/09/23 1550  Vitals shown include unfiled device data.  Last Pain: There were no vitals filed for this visit.       Complications: No notable events documented.

## 2023-01-09 NOTE — Interval H&P Note (Signed)
History and Physical Interval Note:  01/09/2023 11:43 AM  Autumn Moyer  has presented today for surgery, with the diagnosis of LARGE HIATAL HERNIA.  The various methods of treatment have been discussed with the patient and family. After consideration of risks, benefits and other options for treatment, the patient has consented to  Procedure(s): XI ROBOTIC ASSISTED HIATAL HERNIA REPAIR WITH FUNDOPLICATION (N/A) ESOPHAGOGASTRODUODENOSCOPY (EGD) (N/A) as a surgical intervention.  The patient's history has been reviewed, patient examined, no change in status, stable for surgery.  I have reviewed the patient's chart and labs.  Questions were answered to the patient's satisfaction.     Havish Petties Keane Scrape

## 2023-01-09 NOTE — Progress Notes (Signed)
Patient complaining of chest pain. EKG and chest x-ray ordered. Per Dr. Malen Gauze no further interventions necessary and is just the nature of her procedure.

## 2023-01-09 NOTE — Hospital Course (Signed)
History of Present Illness:    Autumn Moyer 82 y.o. female presents for surgical evaluation of a large type IV hiatal hernia.  She previously underwent a TAVR in 2023, but despite having improvement in her exercise tolerance, she continues to have some pronounced shortness of breath with small activities.  She is unable to walk very far, or shop in the grocery store without symptoms.  She has had persistent reflux, she has occasional dysphagia, and recently has started to develop some left back/side pain with certain meals.  She also admits to worsening shortness of breath with meals.   Dr. Cliffton Asters reviewed the patient's diagnostic studies and determined the patient would benefit from surgical intervention. He reviewed the patient's treatment options as well as the risks and benefits of surgery. [name] was agreeable to proceed with surgery.  Hospital Course:  [name] presented to [facility] and was brought to the operating room on 10/21. She underwent Xi robotic assisted hiatal hernia repair with fundoplication and EGD. She tolerated the procedure well and was transferred to the PACU in stable condition. She was NPO and developed nausea, IV Zofran was transitioned to IV Phenergan. She has a history of atrial fibrillation and was in atrial fibrillation with a controlled ventricular rate. IV Lopressor was started. Swallow study showed no leak but some delay of contrast passage without complete blockage, typical following a fundoplication. She was started on a dysphagia 1 diet and tolerated it well. She developed afib with RVR and as discussed with Dr. Cliffton Asters was restarted on her home Lopressor and Digoxin. Her rates became controlled. Her bowels began working and her nausea improved. She was ambulating well on 2L [place]. Home oxygen was ordered. She was felt stable for discharge.

## 2023-01-09 NOTE — Op Note (Signed)
[address]       [name] [address]             [phone]        01/09/2023  Patient:  Autumn Moyer Pre-Op Dx: Type IV hiatal hernia   Post-op Dx:  same Procedure: - Esophagoscopy - Robotic assisted laparoscopy - Paraesophageal hernia repair with OviTex 1S mesh pledgets   Surgeon and Role:      * Corliss Skains, MD - Primary  Assistant: B. Stehler, PA-C  An experienced assistant was required given the complexity of this surgery and the standard of surgical care. The assistant was needed for exposure, dissection, suctioning, retraction of delicate tissues and sutures, instrument exchange and for overall help during this procedure.   Anesthesia  general EBL:  50ml Blood Administration: none Specimen:  none   Counts: correct   Indications: 82yo female with large type IV hiatal hernia. She has stomach and colon in her left chest up to the level of her carina. She is symptomatic from a respiratory standpoint, and only complains of reflux and early satiety from a digestive standpoint. We discussed the risks and benefits of an EGD, robotic assisted laparoscopy with hiatal hernia repair, and fundoplication. She would like some time to consider things. If she agrees, she will need to be off her eliquis 3 days prior to surgery.   Findings: Large hiatal hernia with colon and stomach in the sac.5 posterior sutures, 2 anterior sutures.  Operative Technique: After the risks, benefits and alternatives were thoroughly discussed, the patient was brought to the operative theatre.  Anesthesia was induced, and the esophagoscope was passed through the oropharynx down to the stomach.  The scope was retroflexed and the hiatal hernia was clearly evident.  The scope was pulled back and the mucosal surface of the esophagus was visualized.    The scope was then parked at 25 cm from the incisors.  The patient was then prepped and draped in normal sterile fashion.  An  appropriate surgical pause was performed, and pre-operative antibiotics were dosed accordingly.  We began with a 1 cm incision 15 cm caudad from the xiphoid and slightly lateral to the umbilicus.  Using an Optiview we entered the peritoneal space.  The abdomen was then insufflated with CO2.  3 other robotic ports were placed to triangulate the hiatus.  Another 12 mm port was placed in place at the level of the umbilicus laterally for an assistant port and another 5 mm trocar was placed in the right lower quadrant for liver retractor.  The patient was then placed in steep reverse Trendelenburg and the liver was elevated to expose the esophageal hiatus.  And then the robot was docked.  We began by dividing the gastrohepatic ligament to expose the right diaphragmatic crus and then dissected the hernia sac in a clockwise fashion to mobilize there the stomach and esophagus.  We then divided the short gastrics and moved towards the right crus and completed our dissection along the esophageal hiatus.  A Penrose drain was then used to encircle the the esophagus and we continued our dissection up into the mediastinum.  Once we had achieved 3 to 4 cm of intra-abdominal esophagus we then proceeded to reapproximate the crura with 0 Ethibond sutures in an interrupted fashion.  The gastroscope was passed down through the lower esophageal sphincter into the stomach and would act as our bougie during this repair.  An air leak test  was performed using the gastroscope.  No leak was evident.  The liver retractor was removed and all ports were removed under direct visualization.  The skin and soft tissue were closed with absorbable suture    The patient tolerated the procedure without any immediate complications, and was transferred to the PACU in stable condition.  Ardell Makarewicz Keane Scrape

## 2023-01-09 NOTE — Addendum Note (Signed)
Addendum  created 01/09/23 1759 by Mal Amabile, MD   Clinical Note Signed

## 2023-01-09 NOTE — Anesthesia Postprocedure Evaluation (Addendum)
Anesthesia Post Note  Patient: Autumn Moyer  Procedure(s) Performed: XI ROBOTIC ASSISTED HIATAL HERNIA REPAIR WITH FUNDOPLICATION (Chest) ESOPHAGOGASTRODUODENOSCOPY (EGD)     Patient location during evaluation: PACU Anesthesia Type: General Level of consciousness: awake and alert and oriented Pain management: pain level controlled Vital Signs Assessment: post-procedure vital signs reviewed and stable Respiratory status: spontaneous breathing, nonlabored ventilation and respiratory function stable Cardiovascular status: blood pressure returned to baseline and stable Anesthetic complications: no Comments: Patient complained of intense chest pressure. !2 lead EKG and Portable CXR. 12 lead showed Atrial Fibrillation with occasional ventricular ectopy. CXR showed previous operative changes, no pneumothorax. She was given Zofran and Barhemsys for nausea. Stable for discharge to progressive unit.   No notable events documented.  Last Vitals:  Vitals:   01/09/23 1600 01/09/23 1615  BP: 117/79 127/85  Pulse: 69 82  Resp: 16 18  Temp:    SpO2: 96% 96%    Last Pain:  Vitals:   01/09/23 1615  PainSc: 0-No pain   Pain Goal:                   Tyshika Baldridge A.

## 2023-01-10 ENCOUNTER — Encounter (HOSPITAL_COMMUNITY): Payer: Self-pay | Admitting: Thoracic Surgery (Cardiothoracic Vascular Surgery)

## 2023-01-10 ENCOUNTER — Inpatient Hospital Stay (HOSPITAL_COMMUNITY): Payer: PPO

## 2023-01-10 LAB — COMPREHENSIVE METABOLIC PANEL
ALT: 48 U/L — ABNORMAL HIGH (ref 0–44)
AST: 50 U/L — ABNORMAL HIGH (ref 15–41)
Albumin: 3 g/dL — ABNORMAL LOW (ref 3.5–5.0)
Alkaline Phosphatase: 42 U/L (ref 38–126)
Anion gap: 10 (ref 5–15)
BUN: 15 mg/dL (ref 8–23)
CO2: 26 mmol/L (ref 22–32)
Calcium: 8.4 mg/dL — ABNORMAL LOW (ref 8.9–10.3)
Chloride: 100 mmol/L (ref 98–111)
Creatinine, Ser: 0.88 mg/dL (ref 0.44–1.00)
GFR, Estimated: >60 mL/min (ref >60)
Glucose, Bld: 118 mg/dL — ABNORMAL HIGH (ref 70–99)
Potassium: 3.7 mmol/L (ref 3.5–5.1)
Sodium: 136 mmol/L (ref 135–145)
Total Bilirubin: 4.1 mg/dL — ABNORMAL HIGH (ref 0.3–1.2)
Total Protein: 6.2 g/dL — ABNORMAL LOW (ref 6.5–8.1)

## 2023-01-10 LAB — CBC
HCT: 38.4 % (ref 36.0–46.0)
Hemoglobin: 12.8 g/dL (ref 12.0–15.0)
MCH: 30.4 pg (ref 26.0–34.0)
MCHC: 33.3 g/dL (ref 30.0–36.0)
MCV: 91.2 fL (ref 80.0–100.0)
Platelets: 180 10*3/uL (ref 150–400)
RBC: 4.21 MIL/uL (ref 3.87–5.11)
RDW: 12.8 % (ref 11.5–15.5)
WBC: 13.3 10*3/uL — ABNORMAL HIGH (ref 4.0–10.5)
nRBC: 0 % (ref 0.0–0.2)

## 2023-01-10 MED ORDER — POTASSIUM CHLORIDE 10 MEQ/100ML IV SOLN
10.0000 meq | INTRAVENOUS | Status: AC
  Administered 2023-01-10: 10 meq via INTRAVENOUS
  Filled 2023-01-10: qty 100

## 2023-01-10 MED ORDER — ACETAMINOPHEN 10 MG/ML IV SOLN
1000.0000 mg | Freq: Four times a day (QID) | INTRAVENOUS | Status: AC
  Administered 2023-01-10 (×3): 1000 mg via INTRAVENOUS
  Filled 2023-01-10 (×3): qty 100

## 2023-01-10 MED ORDER — IOHEXOL 300 MG/ML  SOLN
100.0000 mL | Freq: Once | INTRAMUSCULAR | Status: AC | PRN
  Administered 2023-01-10: 30 mL via ORAL

## 2023-01-10 MED ORDER — SODIUM CHLORIDE 0.9 % IV SOLN
12.5000 mg | Freq: Four times a day (QID) | INTRAVENOUS | Status: DC | PRN
  Filled 2023-01-10: qty 0.5

## 2023-01-10 MED ORDER — SODIUM CHLORIDE 0.9 % IV SOLN
12.5000 mg | Freq: Once | INTRAVENOUS | Status: AC
  Administered 2023-01-10: 12.5 mg via INTRAVENOUS
  Filled 2023-01-10: qty 0.5

## 2023-01-10 MED ORDER — METOPROLOL TARTRATE 5 MG/5ML IV SOLN
5.0000 mg | Freq: Four times a day (QID) | INTRAVENOUS | Status: AC
  Administered 2023-01-10 (×3): 5 mg via INTRAVENOUS
  Filled 2023-01-10 (×3): qty 5

## 2023-01-10 NOTE — Plan of Care (Signed)

## 2023-01-10 NOTE — Progress Notes (Addendum)
      [address]       [name] [address]             [phone]      1 Day Post-Op Procedure(s) (LRB): XI ROBOTIC ASSISTED HIATAL HERNIA REPAIR WITH FUNDOPLICATION (N/A) ESOPHAGOGASTRODUODENOSCOPY (EGD) (N/A) Subjective: Patient complains of epigastric pain that comes and goes as well as nausea and a headache. No vomiting.   Objective: Vital signs in last 24 hours: Temp:  [97.1 F (36.2 C)-98.5 F (36.9 C)] 98 F (36.7 C) (10/22 0710) Pulse Rate:  [69-99] 89 (10/22 0710) Cardiac Rhythm: Atrial fibrillation (10/22 0700) Resp:  [13-27] 17 (10/22 0710) BP: (103-149)/(60-106) 120/60 (10/22 0710) SpO2:  [91 %-100 %] 97 % (10/22 0710) Weight:  [64.9 kg] 64.9 kg (10/21 2000)  Hemodynamic parameters for last 24 hours:    Intake/Output from previous day: 10/21 0701 - 10/22 0700 In: 1850.1 [I.V.:1450.1; IV Piggyback:400] Out: 350 [Urine:300; Blood:50] Intake/Output this shift: No intake/output data recorded.  General appearance: alert, cooperative, and fatigued Neurologic: intact Heart: irregularly irregular rhythm and controlled rate, no murmur Lungs: diminished breath sounds bibasilar Abdomen: hypoactive bowel sounds, minimal generalized tenderness around incisions and epigastric, no distension Extremities: SCDs in place Wound: Clean and dry without sign of infection  Lab Results: Recent Labs    01/09/23 2111  WBC 19.0*  HGB 14.3  HCT 42.0  PLT 195   BMET:  Recent Labs    01/09/23 2111  CREATININE 0.98    PT/INR: No results for input(s): "LABPROT", "INR" in the last 72 hours. ABG    Component Value Date/Time   PHART 7.290 (L) 12/14/2021 1442   HCO3 29.1 (H) 12/14/2021 1442   TCO2 27 12/14/2021 1640   O2SAT 96 12/14/2021 1442   CBG (last 3)  No results for input(s): "GLUCAP" in the last 72 hours.  Assessment/Plan: S/P Procedure(s) (LRB): XI ROBOTIC ASSISTED HIATAL HERNIA REPAIR WITH FUNDOPLICATION (N/A) ESOPHAGOGASTRODUODENOSCOPY  (EGD) (N/A)  Neuro: Some epigastric pain this AM. Continue current pain regimen. Patient complains of a headache, likely due to NPO. Continue IV Tylenol. Hopefully will improve if advanced to dysphagia diet  CV: Afib with controlled rate this AM, HR 80s. SBP 120s this AM. Pt takes Lopressor 50mg  BID at home. Will give IV Lopressor 5mg  Q6H.   Pulm: Saturating well on 2L [place]. Encourage ambulation today. Wean oxygen as tolerated.   GI: NPO, will get swallow study this AM. If clear can start dysphagia 1 diet with crushed meds. Pt complains of nausea no vomiting, continue IV Zofran  Endo: Prediabetes, does not take home meds  Renal: BMET pending. Ok UO.   ID: CBC pending. Tmax 98.5.   Expected postop ABLA: CBC pending  DVT Prophylaxis: Lovenox  Dispo: Swallow study this AM. Likely home tomorrow AM.    LOS: 1 day    Jenny Reichmann, PA-C 01/10/2023

## 2023-01-10 NOTE — Discharge Summary (Signed)
Physician Discharge Summary  Patient ID: Autumn Moyer MRN: [id number] DOB/AGE: [birth year] 80 y.o.  Admit date: 01/09/2023 Discharge date: 01/12/2023  Admission Diagnoses:  Discharge Diagnoses:  Principal Problem:   Status post robot-assisted surgical procedure Active Problems:   Paraesophageal hernia   Discharged Condition: stable  History of Present Illness:    Autumn Moyer 82 y.o. female presents for surgical evaluation of a large type IV hiatal hernia.  She previously underwent a TAVR in 2023, but despite having improvement in her exercise tolerance, she continues to have some pronounced shortness of breath with small activities.  She is unable to walk very far, or shop in the grocery store without symptoms.  She has had persistent reflux, she has occasional dysphagia, and recently has started to develop some left back/side pain with certain meals.  She also admits to worsening shortness of breath with meals.   Dr. Cliffton Asters reviewed the patient's diagnostic studies and determined the patient would benefit from surgical intervention. He reviewed the patient's treatment options as well as the risks and benefits of surgery. Autumn Moyer was agreeable to proceed with surgery.  Hospital Course:  Autumn Moyer presented to [facility] and was brought to the operating room on 10/21. She underwent Xi robotic assisted hiatal hernia repair with fundoplication and EGD. She tolerated the procedure well and was transferred to the PACU in stable condition. She was NPO and developed nausea, IV Zofran was transitioned to IV Phenergan. She has a history of atrial fibrillation and was in atrial fibrillation with a controlled ventricular rate. IV Lopressor was started. Swallow study showed no leak but some delay of contrast passage without complete blockage, typical following a fundoplication. She was started on a dysphagia 1 diet and tolerated it well. She developed afib with RVR and as discussed  with Dr. Cliffton Asters was restarted on her home Lopressor and Digoxin. Her rates became controlled. Her bowels began working and her nausea improved. She was ambulating well on 2L [place]. Home oxygen was ordered. She was felt stable for discharge.   Consults: None  Significant Diagnostic Studies: CLINICAL DATA:  Status post fundoplication for paraesophageal hernia repair   EXAM: ESOPHOGRAM/BARIUM SWALLOW   TECHNIQUE: Single contrast examination was performed using dilute Omnipaque 300.   FLUOROSCOPY: Radiation Exposure Index (as provided by the fluoroscopic device): 1 minute 48 seconds. 648.96 micro gray meter squared   COMPARISON:  None Available.   FINDINGS: The patient swallowed contrast without difficulty. There is ordinary age related presbyesophagus with poor primary stripping an abnormal tertiary contractions. There is relative delay of contrast passage into the stomach, typical following fundoplication. No evidence of complete block. No evidence of leak.   IMPRESSION: 1. No evidence of leak following fundoplication. 2. Relative delay of contrast passage into the stomach, typical following fundoplication. No evidence of complete block. 3. Age related presbyesophagus.     Electronically Signed   By: Paulina Fusi M.D.   On: 01/10/2023 16:25   Treatments: surgery: 01/09/2023   Patient:  Autumn Moyer Pre-Op Dx: Type IV hiatal hernia   Post-op Dx:  same Procedure: - Esophagoscopy - Robotic assisted laparoscopy - Paraesophageal hernia repair with OviTex 1S mesh pledgets     Surgeon and Role:      * Lightfoot, Eliezer Lofts, MD - Primary  Discharge Exam: Blood pressure 102/63, pulse 96, temperature 98.1 F (36.7 C), temperature source Oral, resp. rate 20, height 5\' 4"  (1.626 m), weight 64.9 kg, SpO2 93%. General appearance: alert, cooperative,  and no distress Neurologic: intact Heart: irregularly irregular rhythm and controlled rate Lungs: diminished bibasilar breath  sounds Abdomen: soft, non-tender; bowel sounds normal; no masses,  no organomegaly Extremities: edema trace Wound: Clean and dry without sign of infection   Disposition: Discharge disposition: 01-Home or Self Care        Allergies as of 01/12/2023       Reactions   Noroxin [norfloxacin] Nausea And Vomiting   Augmentin [amoxicillin-pot Clavulanate] Swelling   Gums swelling, lips numb   Rosuvastatin Other (See Comments)   Pain in back of legs        Medication List     STOP taking these medications    acetaminophen 500 MG tablet Commonly known as: TYLENOL Replaced by: acetaminophen 160 MG/5ML solution   benazepril-hydrochlorthiazide 20-25 MG tablet Commonly known as: LOTENSIN HCT   pantoprazole 40 MG tablet Commonly known as: PROTONIX       TAKE these medications    acetaminophen 160 MG/5ML solution Commonly known as: TYLENOL Take 20.3 mLs (650 mg total) by mouth every 6 (six) hours as needed for mild pain (pain score 1-3). Replaces: acetaminophen 500 MG tablet   apixaban 5 MG Tabs tablet Commonly known as: Eliquis Take 1 tablet (5 mg total) by mouth 2 (two) times daily.   atorvastatin 20 MG tablet Commonly known as: LIPITOR Take 1 tablet (20 mg total) by mouth daily.   azithromycin 500 MG tablet Commonly known as: ZITHROMAX Take 500 mg by mouth once. Before dental or surgery   digoxin 0.125 MG tablet Commonly known as: LANOXIN Take 1 tablet (125 mcg total) by mouth daily.   furosemide 20 MG tablet Commonly known as: LASIX Take 1 tablet (20 mg total) by mouth daily as needed for edema or fluid.   HYDROcodone-acetaminophen 7.5-325 mg/15 ml solution Commonly known as: HYCET Take 10 mLs by mouth every 6 (six) hours as needed for moderate pain (pain score 4-6) or severe pain (pain score 7-10).   levocetirizine 5 MG tablet Commonly known as: XYZAL Take 1 tablet (5 mg total) by mouth every evening.   levothyroxine 100 MCG tablet Commonly known  as: Synthroid Take 1 tablet (100 mcg total) by mouth daily.   metoprolol tartrate 50 MG tablet Commonly known as: LOPRESSOR Take 1 tablet (50 mg total) by mouth 2 (two) times daily.   mometasone 0.1 % cream Commonly known as: ELOCON Apply 1 Application topically daily. What changed:  when to take this reasons to take this   multivitamin with minerals Tabs tablet Take 1 tablet by mouth every evening.   Refresh Tears 0.5 % Soln Generic drug: carboxymethylcellulose Place 2 drops into both eyes 3 (three) times daily as needed (dry eyes).   tacrolimus 0.1 % ointment Commonly known as: PROTOPIC Apply topically in the morning and at bedtime.               Durable Medical Equipment  (From admission, onward)           Start     Ordered   01/12/23 0749  For home use only DME oxygen  Once       Question Answer Comment  Length of Need 6 Months   Mode or (Route) Nasal cannula   Liters per Minute 2   Frequency Continuous (stationary and portable oxygen unit needed)   Oxygen delivery system Gas      01/12/23 0748            Follow-up Information  Corliss Skains, MD Follow up on 01/18/2023.   Specialty: Cardiothoracic Surgery Why: Virtual follow up appointment is at 2:00PM. please do NOT come to the office as this is a VIRTUAL appointment. Dr. Cliffton Asters will call you. Contact information: [address] [place] [address] [phone]         [facility] Follow up.   Why: oxygen company Contact information: [address] [place] [address] [phone]                 Signed: Jenny Reichmann, PA-C  01/12/2023, 5:29 PM

## 2023-01-10 NOTE — Progress Notes (Signed)
SATURATION QUALIFICATIONS: (This note is used to comply with regulatory documentation for home oxygen)  Patient Saturations on Room Air at Rest = 91%  Patient Saturations on Room Air while Ambulating = 84%  Patient Saturations on 2 Liters of oxygen while Ambulating = 93%

## 2023-01-10 NOTE — Discharge Instructions (Signed)
Diet: Dysphagia diet - only pureed foods the consistency of baby food  Crush all pills, if you cannot crush them then hold them until you follow up with Dr. Cliffton Asters  Do NOT take any whole pills by mouth  Discharge Instructions:  1. You may shower, please wash incisions daily with soap and water and keep dry.  If you wish to cover wounds with dressing you may do so but please keep clean and change daily.  No tub baths or swimming until incisions have completely healed.  If your incisions become red or develop any drainage please call our office at [phone]  2. No Driving until cleared by Dr. Lucilla Lame office and you are no longer using narcotic pain medications  3. Fever of 101.5 for at least 24 hours with no source, please contact our office at [phone]  4. Activity- up as tolerated, please walk at least 3 times per day.  Avoid strenuous activity, no lifting, pushing, or pulling with your arms over 8-10 lbs for a minimum of 6 weeks  5. If any questions or concerns arise, please do not hesitate to contact our office at [phone]

## 2023-01-11 ENCOUNTER — Inpatient Hospital Stay (HOSPITAL_COMMUNITY): Payer: PPO

## 2023-01-11 LAB — COMPREHENSIVE METABOLIC PANEL
ALT: 88 U/L — ABNORMAL HIGH (ref 0–44)
AST: 83 U/L — ABNORMAL HIGH (ref 15–41)
Albumin: 3 g/dL — ABNORMAL LOW (ref 3.5–5.0)
Alkaline Phosphatase: 42 U/L (ref 38–126)
Anion gap: 11 (ref 5–15)
BUN: 14 mg/dL (ref 8–23)
CO2: 28 mmol/L (ref 22–32)
Calcium: 8.8 mg/dL — ABNORMAL LOW (ref 8.9–10.3)
Chloride: 97 mmol/L — ABNORMAL LOW (ref 98–111)
Creatinine, Ser: 0.84 mg/dL (ref 0.44–1.00)
GFR, Estimated: >60 mL/min (ref >60)
Glucose, Bld: 130 mg/dL — ABNORMAL HIGH (ref 70–99)
Potassium: 3.9 mmol/L (ref 3.5–5.1)
Sodium: 136 mmol/L (ref 135–145)
Total Bilirubin: 5 mg/dL — ABNORMAL HIGH (ref 0.3–1.2)
Total Protein: 6.4 g/dL — ABNORMAL LOW (ref 6.5–8.1)

## 2023-01-11 LAB — CBC
HCT: 38.9 % (ref 36.0–46.0)
Hemoglobin: 13.1 g/dL (ref 12.0–15.0)
MCH: 30.9 pg (ref 26.0–34.0)
MCHC: 33.7 g/dL (ref 30.0–36.0)
MCV: 91.7 fL (ref 80.0–100.0)
Platelets: 172 10*3/uL (ref 150–400)
RBC: 4.24 MIL/uL (ref 3.87–5.11)
RDW: 12.6 % (ref 11.5–15.5)
WBC: 13.1 10*3/uL — ABNORMAL HIGH (ref 4.0–10.5)
nRBC: 0 % (ref 0.0–0.2)

## 2023-01-11 MED ORDER — HYDROCODONE-ACETAMINOPHEN 7.5-325 MG/15ML PO SOLN
10.0000 mL | Freq: Four times a day (QID) | ORAL | Status: DC | PRN
  Administered 2023-01-11 – 2023-01-12 (×3): 10 mL via ORAL
  Filled 2023-01-11 (×3): qty 15

## 2023-01-11 MED ORDER — FUROSEMIDE 10 MG/ML IJ SOLN
40.0000 mg | Freq: Once | INTRAMUSCULAR | Status: AC
  Administered 2023-01-11: 40 mg via INTRAVENOUS
  Filled 2023-01-11: qty 4

## 2023-01-11 MED ORDER — METOPROLOL TARTRATE 50 MG PO TABS
50.0000 mg | ORAL_TABLET | Freq: Two times a day (BID) | ORAL | Status: DC
  Administered 2023-01-11 – 2023-01-12 (×3): 50 mg via ORAL
  Filled 2023-01-11 (×3): qty 1

## 2023-01-11 MED ORDER — LEVOTHYROXINE SODIUM 100 MCG PO TABS: 100.0000 ug | ORAL_TABLET | Freq: Every day | ORAL | Status: DC

## 2023-01-11 MED ORDER — POTASSIUM CHLORIDE CRYS ER 20 MEQ PO TBCR: 20.0000 meq | EXTENDED_RELEASE_TABLET | Freq: Every day | ORAL | Status: DC

## 2023-01-11 MED ORDER — ACETAMINOPHEN 160 MG/5ML PO SOLN: 650.0000 mg | Freq: Four times a day (QID) | ORAL | Status: DC | PRN

## 2023-01-11 MED ORDER — METOPROLOL TARTRATE 5 MG/5ML IV SOLN
5.0000 mg | Freq: Once | INTRAVENOUS | Status: AC
  Administered 2023-01-11: 5 mg via INTRAVENOUS
  Filled 2023-01-11: qty 5

## 2023-01-11 MED ORDER — LEVOTHYROXINE SODIUM 100 MCG PO TABS
100.0000 ug | ORAL_TABLET | Freq: Every day | ORAL | Status: DC
Start: 2023-01-11 — End: 2023-01-12
  Administered 2023-01-11 – 2023-01-12 (×2): 100 ug via ORAL
  Filled 2023-01-11 (×2): qty 1

## 2023-01-11 MED ORDER — DIGOXIN 125 MCG PO TABS
0.1250 mg | ORAL_TABLET | Freq: Every day | ORAL | Status: DC
  Administered 2023-01-11 – 2023-01-12 (×2): 0.125 mg via ORAL
  Filled 2023-01-11 (×2): qty 1

## 2023-01-11 MED ORDER — METOCLOPRAMIDE HCL 5 MG/ML IJ SOLN
10.0000 mg | Freq: Four times a day (QID) | INTRAMUSCULAR | Status: DC
  Administered 2023-01-11 – 2023-01-12 (×3): 10 mg via INTRAVENOUS
  Filled 2023-01-11 (×3): qty 2

## 2023-01-11 MED ORDER — ATORVASTATIN CALCIUM 10 MG PO TABS
20.0000 mg | ORAL_TABLET | Freq: Every day | ORAL | Status: DC
  Filled 2023-01-11: qty 2

## 2023-01-11 MED ORDER — LACTULOSE 10 GM/15ML PO SOLN: 20.0000 g | Freq: Every day | ORAL | Status: DC | PRN

## 2023-01-11 MED ORDER — POTASSIUM CHLORIDE 20 MEQ PO PACK
20.0000 meq | PACK | Freq: Every day | ORAL | Status: DC
  Administered 2023-01-11 – 2023-01-12 (×2): 20 meq via ORAL
  Filled 2023-01-11 (×2): qty 1

## 2023-01-11 NOTE — Progress Notes (Addendum)
      [address]       [name] [address]             [phone]      2 Days Post-Op Procedure(s) (LRB): XI ROBOTIC ASSISTED HIATAL HERNIA REPAIR WITH FUNDOPLICATION (N/A) ESOPHAGOGASTRODUODENOSCOPY (EGD) (N/A) Subjective: Patient states she wished she were feeling better but admits to feeling better than yesterday. Was able to tolerate dysphagia 1 diet yesterday, nausea improving.   Objective: Vital signs in last 24 hours: Temp:  [98.1 F (36.7 C)-98.7 F (37.1 C)] 98.7 F (37.1 C) (10/23 0711) Pulse Rate:  [87-108] 108 (10/23 0711) Cardiac Rhythm: Atrial fibrillation (10/22 1933) Resp:  [17-23] 20 (10/23 0711) BP: (114-139)/(64-75) 138/75 (10/23 0711) SpO2:  [91 %-98 %] 91 % (10/23 0711)  Hemodynamic parameters for last 24 hours:    Intake/Output from previous day: 10/22 0701 - 10/23 0700 In: 412.4 [P.O.:120; IV Piggyback:292.4] Out: -  Intake/Output this shift: No intake/output data recorded.  General appearance: alert, cooperative, flushed, and no distress Neurologic: intact Heart: irregularly irregular rhythm Lungs: diminished bibasilar breath sounds Abdomen: soft, non-tender; bowel sounds normal; no masses,  no organomegaly Extremities: SCDs in place Wound: Clean and dry without sign of infection  Lab Results: Recent Labs    01/10/23 0921 01/11/23 0221  WBC 13.3* 13.1*  HGB 12.8 13.1  HCT 38.4 38.9  PLT 180 172   BMET:  Recent Labs    01/10/23 1032 01/11/23 0221  NA 136 136  K 3.7 3.9  CL 100 97*  CO2 26 28  GLUCOSE 118* 130*  BUN 15 14  CREATININE 0.88 0.84  CALCIUM 8.4* 8.8*    PT/INR: No results for input(s): "LABPROT", "INR" in the last 72 hours. ABG    Component Value Date/Time   PHART 7.290 (L) 12/14/2021 1442   HCO3 29.1 (H) 12/14/2021 1442   TCO2 27 12/14/2021 1640   O2SAT 96 12/14/2021 1442   CBG (last 3)  No results for input(s): "GLUCAP" in the last 72 hours.  Assessment/Plan: S/P Procedure(s)  (LRB): XI ROBOTIC ASSISTED HIATAL HERNIA REPAIR WITH FUNDOPLICATION (N/A) ESOPHAGOGASTRODUODENOSCOPY (EGD) (N/A)  Neuro: Pain controlled this AM. Will transition IV meds to liquid Tylenol and Hycet, continue IV morphine PRN.   CV: Hx of afib. Afib with RVR this AM, HR 100-120s. SBP 130s this AM. As discussed with Dr. Cliffton Asters will restart patient's home Lopressor 50mg  BID and Digoxin 0.125mg  daily. All meds to be crushed and taken in applesauce.    Pulm: Saturating well on 2L [place]. Patient's O2 saturations drop with ambulation. Will get CXR. Diurese today as discussed with Dr. Cliffton Asters. Encourage ambulation today. Wean oxygen as tolerated.    GI: Swallow study with no leak, there is delay of contrast passage but no complete block. Tolerating dysphagia 1 diet  Pt complains of nausea no vomiting improved, continue Phenergan   Endo: Prediabetes, does not take home meds. Hypothyroid, will restart home Synthroid.    Renal: Cr stable   ID: Leukocytosis likely reactive. Afebrile   DVT Prophylaxis: Lovenox   Dispo: Work on dysphagia 1 diet and nausea. Ambulation today. Work on HR and rhythm control. Dispo planning     LOS: 2 days    Jenny Reichmann, PA-C 01/11/2023

## 2023-01-12 LAB — BASIC METABOLIC PANEL
Anion gap: 8 (ref 5–15)
BUN: 20 mg/dL (ref 8–23)
CO2: 28 mmol/L (ref 22–32)
Calcium: 8.8 mg/dL — ABNORMAL LOW (ref 8.9–10.3)
Chloride: 100 mmol/L (ref 98–111)
Creatinine, Ser: 0.82 mg/dL (ref 0.44–1.00)
GFR, Estimated: >60 mL/min (ref >60)
Glucose, Bld: 146 mg/dL — ABNORMAL HIGH (ref 70–99)
Potassium: 4.2 mmol/L (ref 3.5–5.1)
Sodium: 136 mmol/L (ref 135–145)

## 2023-01-12 LAB — MAGNESIUM: Magnesium: 1.9 mg/dL (ref 1.7–2.4)

## 2023-01-12 MED ORDER — HYDROCODONE-ACETAMINOPHEN 7.5-325 MG/15ML PO SOLN: 10.0000 mL | Freq: Four times a day (QID) | ORAL | 0 refills | Status: DC | PRN

## 2023-01-12 MED ORDER — ACETAMINOPHEN 160 MG/5ML PO SOLN: 650.0000 mg | Freq: Four times a day (QID) | ORAL | Status: DC | PRN

## 2023-01-12 MED ORDER — FUROSEMIDE 20 MG PO TABS
20.0000 mg | ORAL_TABLET | Freq: Every day | ORAL | Status: DC
  Administered 2023-01-12: 20 mg via ORAL
  Filled 2023-01-12: qty 1

## 2023-01-12 NOTE — Progress Notes (Signed)
Mobility Specialist Progress Note:   01/12/23 1100  Mobility  Activity Ambulated with assistance in hallway  Level of Assistance Standby assist, set-up cues, supervision of patient - no hands on  Assistive Device Front wheel walker  Distance Ambulated (ft) 100 ft  Activity Response Tolerated well  Mobility Referral Yes  $Mobility charge 1 Mobility  Mobility Specialist Start Time (ACUTE ONLY) 1055  Mobility Specialist Stop Time (ACUTE ONLY) 1106  Mobility Specialist Time Calculation (min) (ACUTE ONLY) 11 min    Pt received in bed, agreeable to mobility. Asymptomatic, but desat to 84% SpO2 during ambulation. Rose to 87% with pursed lipped breathing, and required 2L O2 to return [facility]. No complaints throughout. Pt left in bed with call bell and family present.  Autumn Moyer Mobility Specialist Please contact via Special educational needs teacher or Rehab office at [phone]

## 2023-01-12 NOTE — Progress Notes (Signed)
Nurse requested Mobility Specialist to perform oxygen saturation test with pt which includes removing pt from oxygen both at rest and while ambulating.  Below are the results from that testing.     Patient Saturations on Room Air at Rest = spO2 90%  Patient Saturations on Room Air while Ambulating = sp02 84% .  Rested and performed pursed lip breathing for 1 minute with sp02 at 87%.  Patient Saturations on 2 Liters of oxygen while Ambulating = sp02 91%  At end of testing pt left in room on 2  Liters of oxygen.  Reported results to nurse.   D'Vante Earlene Plater Mobility Specialist Please contact via Special educational needs teacher or Rehab office at [phone]

## 2023-01-12 NOTE — Progress Notes (Signed)
Mobility Specialist Progress Note:   01/12/23 1000  Mobility  Activity Ambulated with assistance in hallway  Level of Assistance Contact guard assist, steadying assist  Assistive Device Front wheel walker  Distance Ambulated (ft) 160 ft  Activity Response Tolerated well  Mobility Referral Yes  $Mobility charge 1 Mobility  Mobility Specialist Start Time (ACUTE ONLY) 0946  Mobility Specialist Stop Time (ACUTE ONLY) 0959  Mobility Specialist Time Calculation (min) (ACUTE ONLY) 13 min    Pt received in bed, agreeable to mobility. VSS throughout ambulation, on room air. Asymptomatic and no complaints. Pt left on EOB with call bell and all needs met. Family present.  Autumn Moyer Mobility Specialist Please contact via Special educational needs teacher or Rehab office at [phone]

## 2023-01-12 NOTE — Progress Notes (Signed)
Nurse requested Mobility Specialist to perform oxygen saturation test with pt which includes removing pt from oxygen both at rest and while ambulating.  Below are the results from that testing.     Patient Saturations on Room Air at Rest = spO2 95%  Patient Saturations on Room Air while Ambulating = sp02 87% .  Rested and performed pursed lip breathing for 1 minute with sp02 at 90%.  Patient Saturations on 0 Liters of oxygen while Ambulating = sp02 91%  At end of testing pt left in room on 0  Liters of oxygen.  Reported results to nurse.    Autumn Moyer Mobility Specialist Please contact via Special educational needs teacher or Rehab office at [phone]

## 2023-01-12 NOTE — Plan of Care (Signed)
  Problem: Health Behavior/Discharge Planning: Goal: Ability to manage health-related needs will improve Outcome: Progressing   Problem: Clinical Measurements: Goal: Will remain free from infection Outcome: Progressing Goal: Diagnostic test results will improve Outcome: Progressing   

## 2023-01-12 NOTE — Progress Notes (Signed)
   01/12/23 1157  TOC Brief Assessment  Insurance and Status Reviewed  Patient has primary care physician Yes  Home environment has been reviewed yes  Prior level of function: independent  Prior/Current Home Services No current home services  Social Determinants of Health Reivew SDOH reviewed no interventions necessary  Readmission risk has been reviewed Yes  Transition of care needs no transition of care needs at this time (see note)     Spoke to patient at bedside, regarding home oxygen. [employer] will bring portable oxygen DME to hospital room prior to discharge. And schedule a time for home DME delivery. Patient and visitor voiced understanding

## 2023-01-12 NOTE — Progress Notes (Signed)
      [address]       [name] [address]             [phone]      3 Days Post-Op Procedure(s) (LRB): XI ROBOTIC ASSISTED HIATAL HERNIA REPAIR WITH FUNDOPLICATION (N/A) ESOPHAGOGASTRODUODENOSCOPY (EGD) (N/A) Subjective: Patient without new complaints this AM. Sitting up in chair. Walked to bathroom independently and walked around the unit yesterday.   Objective: Vital signs in last 24 hours: Temp:  [98.1 F (36.7 C)-98.7 F (37.1 C)] 98.1 F (36.7 C) (10/24 0358) Pulse Rate:  [92-106] 96 (10/24 0358) Cardiac Rhythm: Atrial fibrillation (10/24 0703) Resp:  [18-20] 20 (10/24 0358) BP: (102-122)/(63-79) 102/63 (10/24 0358) SpO2:  [93 %-98 %] 93 % (10/24 0358)  Hemodynamic parameters for last 24 hours:    Intake/Output from previous day: 10/23 0701 - 10/24 0700 In: 340 [P.O.:340] Out: 150 [Urine:150] Intake/Output this shift: No intake/output data recorded.  General appearance: alert, cooperative, and no distress Neurologic: intact Heart: irregularly irregular rhythm and controlled rate Lungs: diminished bibasilar breath sounds Abdomen: soft, non-tender; bowel sounds normal; no masses,  no organomegaly Extremities: edema trace Wound: Clean and dry without sign of infection  Lab Results: Recent Labs    01/10/23 0921 01/11/23 0221  WBC 13.3* 13.1*  HGB 12.8 13.1  HCT 38.4 38.9  PLT 180 172   BMET:  Recent Labs    01/11/23 0221 01/12/23 0231  NA 136 136  K 3.9 4.2  CL 97* 100  CO2 28 28  GLUCOSE 130* 146*  BUN 14 20  CREATININE 0.84 0.82  CALCIUM 8.8* 8.8*    PT/INR: No results for input(s): "LABPROT", "INR" in the last 72 hours. ABG    Component Value Date/Time   PHART 7.290 (L) 12/14/2021 1442   HCO3 29.1 (H) 12/14/2021 1442   TCO2 27 12/14/2021 1640   O2SAT 96 12/14/2021 1442   CBG (last 3)  No results for input(s): "GLUCAP" in the last 72 hours.  Assessment/Plan: S/P Procedure(s) (LRB): XI ROBOTIC ASSISTED HIATAL  HERNIA REPAIR WITH FUNDOPLICATION (N/A) ESOPHAGOGASTRODUODENOSCOPY (EGD) (N/A)  Neuro: Pain controlled this AM. Continue liquid Tylenol and Hycet, continue IV morphine PRN.   CV: Hx of afib. Afib with controlled rates this AM, HR 90s-105. On Home digoxin 0.125mg  daily. BP controlled, SBP 102-120s on home Lopressor 50 mg BID. Will hold Lotensin HCT for now. All meds to be crushed and taken in applesauce.    Pulm: Saturating well on 2L [place]. CXR yesterday with bibasilar atelectasis and a small left pleural effusion. Patient's O2 saturations drop with ambulation. Pt was given IV Lasix yesterday, will restart home PO Lasix 20mg  daily. Patient may need home oxygen, will order. Encourage ambulation today. Wean oxygen as tolerated.    GI: Swallow study with no leak, there is delay of contrast passage but no complete block. Tolerating dysphagia 1 diet. Pts nausea has resolved, +BM.    Endo: Prediabetes, does not take home meds. Hypothyroid, on home Synthroid.    Renal: Cr stable   ID: Leukocytosis likely reactive. Afebrile   DVT Prophylaxis: Lovenox   Dispo: Will order home O2. Can likely d/c patient to home today.      LOS: 3 days    Jenny Reichmann, PA-C 01/12/2023

## 2023-01-12 NOTE — Plan of Care (Signed)

## 2023-01-12 NOTE — Progress Notes (Signed)
Went over AVS paper work with patient and daughter, removed IV, Home oxygen delivered to room, gathered patient belongings and patient was wheeled out to daughters vehicle.

## 2023-01-12 NOTE — Care Management Important Message (Signed)
Important Message  Patient Details  Name: Autumn Moyer MRN: [id number] Date of Birth: 1941/01/27   Important Message Given:  Yes - Medicare IM     Dorena Bodo 01/12/2023, 2:43 PM

## 2023-01-18 ENCOUNTER — Ambulatory Visit (INDEPENDENT_AMBULATORY_CARE_PROVIDER_SITE_OTHER): Payer: Self-pay | Admitting: Thoracic Surgery (Cardiothoracic Vascular Surgery)

## 2023-01-18 DIAGNOSIS — K449 Diaphragmatic hernia without obstruction or gangrene: Secondary | ICD-10-CM

## 2023-01-18 NOTE — Progress Notes (Signed)
     [address]       [name] [address]             [phone]       Patient: Home Provider: Office Consent for Telemedicine visit obtained.  Today's visit was completed via a real-time telehealth (see specific modality noted below). The patient/authorized person provided oral consent at the time of the visit to engage in a telemedicine encounter with the present provider at [facility]. The patient/authorized person was informed of the potential benefits, limitations, and risks of telemedicine. The patient/authorized person expressed understanding that the laws that protect confidentiality also apply to telemedicine. The patient/authorized person acknowledged understanding that telemedicine does not provide emergency services and that he or she would need to call 911 or proceed to the nearest hospital for help if such a need arose.   Total time spent in the clinical discussion 10 minutes.  Telehealth Modality: Phone visit (audio only)  I had a telephone visit with Autumn Moyer.  Overall, she is doing well.  She denies any reflux or dysphagia.  Her SOB is improving from baseline.  She is clear to start a soft mechanical diet.  She will follow-up in 1 month with a CXR.

## 2023-01-27 ENCOUNTER — Telehealth: Payer: PPO | Admitting: Thoracic Surgery (Cardiothoracic Vascular Surgery)

## 2023-02-03 ENCOUNTER — Telehealth: Payer: Self-pay | Admitting: *Deleted

## 2023-02-03 NOTE — Telephone Encounter (Signed)
Patient contacted the office asking when her diet can advance from fork tender foods. Per Dr. Cliffton Asters, patient advised diet may advance but advised to chew food slowly and thoroughly. Patient verbalized understanding.

## 2023-02-22 ENCOUNTER — Other Ambulatory Visit: Payer: Self-pay | Admitting: Thoracic Surgery (Cardiothoracic Vascular Surgery)

## 2023-02-22 DIAGNOSIS — K449 Diaphragmatic hernia without obstruction or gangrene: Secondary | ICD-10-CM

## 2023-02-24 ENCOUNTER — Encounter: Payer: Self-pay | Admitting: Thoracic Surgery (Cardiothoracic Vascular Surgery)

## 2023-02-24 ENCOUNTER — Ambulatory Visit (INDEPENDENT_AMBULATORY_CARE_PROVIDER_SITE_OTHER): Payer: Self-pay | Admitting: Thoracic Surgery (Cardiothoracic Vascular Surgery)

## 2023-02-24 ENCOUNTER — Ambulatory Visit
Admission: RE | Admit: 2023-02-24 | Discharge: 2023-02-24 | Disposition: A | Discharge status: Home / Self Care | Payer: PPO | Source: Ambulatory Visit | Attending: Thoracic Surgery (Cardiothoracic Vascular Surgery)

## 2023-02-24 VITALS — BP 177/79 | HR 100 | Resp 18 | Ht 64.0 in | Wt 141.0 lb

## 2023-02-24 DIAGNOSIS — J90 Pleural effusion, not elsewhere classified: Secondary | ICD-10-CM | POA: Diagnosis not present

## 2023-02-24 DIAGNOSIS — K449 Diaphragmatic hernia without obstruction or gangrene: Secondary | ICD-10-CM

## 2023-02-24 DIAGNOSIS — R918 Other nonspecific abnormal finding of lung field: Secondary | ICD-10-CM | POA: Diagnosis not present

## 2023-02-24 NOTE — Progress Notes (Unsigned)
      [address]       [place] [address]             [phone]        OVIDA MATHE [facility] Medical Record #[id number] Date of Birth: [birth year]  Referring: Rollene Rotunda, MD Primary Care: [name], FNP Primary Cardiologist:James Hochrein, MD  Reason for visit:   follow-up  History of Present Illness:     89 female presents for her 1 month follow-up appointment.  Her breathing is continuing to improve slowly.  She denies any reflux or dysphagia.  Physical Exam: BP (!) 177/79 (BP Location: Left Arm, Patient Position: Sitting)   Pulse 100   Resp 18   Ht 5\' 4"  (1.626 m)   Wt 141 lb (64 kg)   SpO2 97% Comment: RA  BMI 24.20 kg/m   Alert NAd Abdomen, ND no peripheral edema   Diagnostic Studies & Laboratory data: CXR: some fluid remain in the hernia sac.     Assessment / Plan:   88 female s/p Hiatal hernia repair.  She is also hypertensive today.  She will restart he blood pressure medication.  There appears to be some fluid left in the hernia sac.  We discussed the option of getting a scan, and potentially a thoracentesis vs more pulm-hygiene.  She would like to wait for now.  I will see her back in a few months.      Corliss Skains 03/01/2023 9:39 AM

## 2023-03-27 ENCOUNTER — Ambulatory Visit (INDEPENDENT_AMBULATORY_CARE_PROVIDER_SITE_OTHER): Payer: PPO

## 2023-03-27 VITALS — Ht 64.0 in | Wt 141.0 lb

## 2023-03-27 DIAGNOSIS — Z0000 Encounter for general adult medical examination without abnormal findings: Secondary | ICD-10-CM

## 2023-03-27 NOTE — Patient Instructions (Signed)
 Ms. Hoeg , Thank you for taking time to come for your Medicare Wellness Visit. I appreciate your ongoing commitment to your health goals. Please review the following plan we discussed and let me know if I can assist you in the future.   Referrals/Orders/Follow-Ups/Clinician Recommendations: Aim for 30 minutes of exercise or brisk walking, 6-8 glasses of water , and 5 servings of fruits and vegetables each day.  This is a list of the screening recommended for you and due dates:  Health Maintenance  Topic Date Due   DEXA scan (bone density measurement)  05/20/2007   COVID-19 Vaccine (4 - 2024-25 season) 11/20/2022   Medicare Annual Wellness Visit  03/26/2024   DTaP/Tdap/Td vaccine (3 - Td or Tdap) 05/27/2026   Pneumonia Vaccine  Completed   Flu Shot  Completed   Zoster (Shingles) Vaccine  Completed   HPV Vaccine  Aged Out   Colon Cancer Screening  Discontinued   Hepatitis C Screening  Discontinued    Advanced directives: (In Chart) A copy of your advanced directives are scanned into your chart should your provider ever need it.  Next Medicare Annual Wellness Visit scheduled for next year: Yes

## 2023-03-27 NOTE — Progress Notes (Signed)
 Subjective:   Autumn Moyer is a 83 y.o. female who presents for Medicare Annual (Subsequent) preventive examination.  Visit Complete: Virtual I connected with  Autumn Moyer on 03/27/23 by a audio enabled telemedicine application and verified that I am speaking with the correct person using two identifiers.  Patient Location: Home  Provider Location: Home Office  Interactive audio and video telecommunications were attempted between this provider and patient, however failed, due to patient having technical difficulties OR patient did not have access to video capability.  We continued and completed visit with audio only.  I discussed the limitations of evaluation and management by telemedicine. The patient expressed understanding and agreed to proceed.  Vital Signs: Because this visit was a virtual/telehealth visit, some criteria may be missing or patient reported. Any vitals not documented were not able to be obtained and vitals that have been documented are patient reported.  Cardiac Risk Factors include: advanced age (>20men, >82 women);hypertension     Objective:    Today's Vitals   03/27/23 1203  Weight: 141 lb (64 kg)  Height: 5' 4 (1.626 m)   Body mass index is 24.2 kg/m.     03/27/2023   12:10 PM 01/09/2023    9:54 PM 01/06/2023   10:53 AM 11/01/2022   10:38 AM 04/27/2022    9:36 AM 03/23/2022    1:26 PM 03/11/2022    8:15 AM  Advanced Directives  Does Patient Have a Medical Advance Directive? Yes Yes Yes Yes Yes Yes Yes  Type of Estate Agent of [place];Living will Living will Living will Living will Living will Healthcare Power of Attorney;Living will Healthcare Power of [place];Living will  Does patient want to make changes to medical advance directive? No - Patient declined No - Patient declined    No - Patient declined   Copy of Healthcare Power of Attorney in Chart? Yes - validated most recent copy scanned in chart (See row information)   Yes - validated most recent copy scanned in chart (See row information)   Yes - validated most recent copy scanned in chart (See row information)     Current Medications (verified) Outpatient Encounter Medications as of 03/27/2023  Medication Sig   acetaminophen  (TYLENOL ) 160 MG/5ML solution Take 20.3 mLs (650 mg total) by mouth every 6 (six) hours as needed for mild pain (pain score 1-3).   apixaban  (ELIQUIS ) 5 MG TABS tablet Take 1 tablet (5 mg total) by mouth 2 (two) times daily.   azithromycin  (ZITHROMAX ) 500 MG tablet Take 500 mg by mouth once. Before dental or surgery   carboxymethylcellulose (REFRESH TEARS) 0.5 % SOLN Place 2 drops into both eyes 3 (three) times daily as needed (dry eyes).   digoxin  (LANOXIN ) 0.125 MG tablet Take 1 tablet (125 mcg total) by mouth daily.   furosemide  (LASIX ) 20 MG tablet Take 1 tablet (20 mg total) by mouth daily as needed for edema or fluid.   levocetirizine (XYZAL ) 5 MG tablet Take 1 tablet (5 mg total) by mouth every evening.   levothyroxine  (SYNTHROID ) 100 MCG tablet Take 1 tablet (100 mcg total) by mouth daily.   metoprolol  tartrate (LOPRESSOR ) 50 MG tablet Take 1 tablet (50 mg total) by mouth 2 (two) times daily.   mometasone  (ELOCON ) 0.1 % cream Apply 1 Application topically daily. (Patient taking differently: Apply 1 Application topically daily as needed (eczema in ears).)   Multiple Vitamin (MULTIVITAMIN WITH MINERALS) TABS tablet Take 1 tablet by mouth every evening.  tacrolimus  (PROTOPIC ) 0.1 % ointment Apply topically in the morning and at bedtime.   atorvastatin  (LIPITOR) 20 MG tablet Take 1 tablet (20 mg total) by mouth daily. (Patient not taking: Reported on 03/27/2023)   No facility-administered encounter medications on file as of 03/27/2023.    Allergies (verified) Noroxin [norfloxacin], Augmentin  [amoxicillin -pot clavulanate], and Rosuvastatin    History: Past Medical History:  Diagnosis Date   Arthritis    Atrial fibrillation,  persistent (HCC)    Complication of anesthesia    Dysrhythmia    A. Fib   GERD (gastroesophageal reflux disease)    Related to Hiatal Hernia   Headache    Localized in bilateral eyes   Heart murmur    TAVR 2023   Hiatal hernia    Hypertension    Hypothyroidism    PONV (postoperative nausea and vomiting)    Pre-diabetes    S/P TAVR (transcatheter aortic valve replacement) 12/14/2021   s/p TAVR with a 23mm Edwards S3UR via the TF approach by Dr. Wendel & Dr. Maryjane   Severe aortic stenosis    Squamous cell carcinoma of skin 01/16/2018   in situ-right knee post (txpbx)   Thyroid  disease    Past Surgical History:  Procedure Laterality Date   ABDOMINAL AORTOGRAM N/A 09/22/2021   Procedure: ABDOMINAL AORTOGRAM;  Surgeon: Wendel Lurena POUR, MD;  Location: [facility];  Service: Cardiovascular;  Laterality: N/A;   ESOPHAGOGASTRODUODENOSCOPY N/A 01/09/2023   Procedure: ESOPHAGOGASTRODUODENOSCOPY (EGD);  Surgeon: Shyrl Linnie KIDD, MD;  Location: [facility] OR;  Service: Thoracic;  Laterality: N/A;   EYE SURGERY     INTRAOPERATIVE TRANSTHORACIC ECHOCARDIOGRAM N/A 12/14/2021   Procedure: INTRAOPERATIVE TRANSTHORACIC ECHOCARDIOGRAM;  Surgeon: Wendel Lurena POUR, MD;  Location: [facility] OR;  Service: Open Heart Surgery;  Laterality: N/A;   MULTIPLE EXTRACTIONS WITH ALVEOLOPLASTY N/A 12/02/2021   Procedure: MULTIPLE EXTRACTION WITH ALVEOLOPLASTY;  Surgeon: Celena Lum NOVAK, DMD;  Location: [facility] OR;  Service: Dentistry;  Laterality: N/A;   RIGHT HEART CATH AND CORONARY/GRAFT ANGIOGRAPHY N/A 09/22/2021   Procedure: RIGHT HEART CATH AND CORONARY/GRAFT ANGIOGRAPHY;  Surgeon: Wendel Lurena POUR, MD;  Location: [facility];  Service: Cardiovascular;  Laterality: N/A;   TOE SURGERY Right    Hammer Toes   TRANSCATHETER AORTIC VALVE REPLACEMENT, TRANSFEMORAL N/A 12/14/2021   Procedure: Transcatheter Aortic Valve Replacement, Transfemoral using Edwards 23 MM SAPIEN 3 Ultra;  Surgeon: Thukkani, Arun K,  MD;  Location: [facility] OR;  Service: Open Heart Surgery;  Laterality: N/A;  Transfemoral approach   TUBAL LIGATION  1986   XI ROBOTIC ASSISTED HIATAL HERNIA REPAIR N/A 01/09/2023   Procedure: XI ROBOTIC ASSISTED HIATAL HERNIA REPAIR WITH FUNDOPLICATION;  Surgeon: Shyrl Linnie KIDD, MD;  Location: [facility] OR;  Service: Thoracic;  Laterality: N/A;   Family History  Problem Relation Age of Onset   Migraines Mother    Hypertension Mother    Hyperlipidemia Mother    Heart failure Mother        Age 22   Hyperlipidemia Father    Hypertension Father    Heart disease Father        died of MI after developoing cancer.   Lung cancer Father    Hypertension Sister    Breast cancer Paternal Aunt    Migraines Maternal Grandfather    Migraines Daughter    Breast cancer Daughter    Social History   Socioeconomic History   Marital status: Married    Spouse name: Salomon   Number of children: 2  Years of education: Not on file   Highest education level: Not on file  Occupational History   Not on file  Tobacco Use   Smoking status: Never    Passive exposure: Past   Smokeless tobacco: Never  Vaping Use   Vaping status: Never Used  Substance and Sexual Activity   Alcohol use: No    Alcohol/week: 0.0 standard drinks of alcohol   Drug use: No   Sexual activity: Not Currently    Partners: Male  Other Topics Concern   Not on file  Social History Narrative   Lives with husband.  Married x 56 years in 2022.   2 daughters   5 grand children   1 great grandchild.   Social Drivers of Corporate Investment Banker Strain: Low Risk  (03/27/2023)   Overall Financial Resource Strain (CARDIA)    Difficulty of Paying Living Expenses: Not hard at all  Food Insecurity: No Food Insecurity (03/27/2023)   Hunger Vital Sign    Worried About Running Out of Food in the Last Year: Never true    Ran Out of Food in the Last Year: Never true  Transportation Needs: No Transportation Needs (03/27/2023)   PRAPARE -  Administrator, Civil Service (Medical): No    Lack of Transportation (Non-Medical): No  Physical Activity: Sufficiently Active (03/27/2023)   Exercise Vital Sign    Days of Exercise per Week: 5 days    Minutes of Exercise per Session: 30 min  Stress: No Stress Concern Present (03/27/2023)   [employer] of Occupational Health - Occupational Stress Questionnaire    Feeling of Stress : Not at all  Social Connections: Socially Integrated (03/27/2023)   Social Connection and Isolation Panel [NHANES]    Frequency of Communication with Friends and Family: More than three times a week    Frequency of Social Gatherings with Friends and Family: Three times a week    Attends Religious Services: More than 4 times per year    Active Member of Clubs or Organizations: Yes    Attends Engineer, Structural: More than 4 times per year    Marital Status: Married    Tobacco Counseling Counseling given: Not Answered   Clinical Intake:  Pre-visit preparation completed: Yes  Pain : No/denies pain     Diabetes: No  How often do you need to have someone help you when you read instructions, pamphlets, or other written materials from your doctor or pharmacy?: 1 - Never  Interpreter Needed?: No  Information entered by :: Charmaine Bloodgood LPN   Activities of Daily Living    03/27/2023   12:10 PM 01/09/2023    9:54 PM  In your present state of health, do you have any difficulty performing the following activities:  Hearing? 0 0  Vision? 0 0  Difficulty concentrating or making decisions? 0 0  Walking or climbing stairs? 0   Dressing or bathing? 0   Doing errands, shopping? 0 0  Preparing Food and eating ? N   Using the Toilet? N   In the past six months, have you accidently leaked urine? N   Do you have problems with loss of bowel control? N   Managing your Medications? N   Managing your Finances? N   Housekeeping or managing your Housekeeping? N     Patient Care  Team: Lavell Bari LABOR, FNP as PCP - General (Family Medicine) Lavona Agent, MD as PCP - Cardiology (Cardiology) Skeet Juliene SAUNDERS,  DO as Consulting Physician (Neurology)  Indicate any recent Medical Services you may have received from other than [facility] providers in the past year (date may be approximate).     Assessment:   This is a routine wellness examination for Winger.  Hearing/Vision screen Hearing Screening - Comments:: Denies hearing difficulties   Vision Screening - Comments:: Wears rx glasses - up to date with routine eye exams with Dr. Patrcia    Goals Addressed             This Visit's Progress    Remain active and independent        Depression Screen    03/27/2023   12:07 PM 12/19/2022   10:02 AM 08/19/2022   10:06 AM 04/05/2022   11:36 AM 03/23/2022    1:25 PM 01/07/2022    1:22 PM 12/17/2021    9:37 AM  PHQ 2/9 Scores  PHQ - 2 Score 0 0 0 0 0 0 0  PHQ- 9 Score  0  3  2     Fall Risk    03/27/2023   12:09 PM 12/19/2022   10:02 AM 11/01/2022   10:38 AM 08/19/2022   10:07 AM 06/16/2022    9:50 AM  Fall Risk   Falls in the past year? 0 0 0 0 0  Number falls in past yr: 0 0 0 0 0  Injury with Fall? 0 0 0 0 0  Risk for fall due to : No Fall Risks No Fall Risks  No Fall Risks No Fall Risks  Follow up Falls prevention discussed;Education provided;Falls evaluation completed Falls evaluation completed;Education provided Falls evaluation completed Falls evaluation completed;Education provided Falls evaluation completed    MEDICARE RISK AT HOME: Medicare Risk at Home Any stairs in or around the home?: No If so, are there any without handrails?: No Home free of loose throw rugs in walkways, pet beds, electrical cords, etc?: Yes Adequate lighting in your home to reduce risk of falls?: Yes Life alert?: No Use of a cane, walker or w/c?: No Grab bars in the bathroom?: Yes Shower chair or bench in shower?: No Elevated toilet seat or a handicapped toilet?: Yes  TIMED UP  AND GO:  Was the test performed?  No    Cognitive Function:        03/27/2023   12:10 PM 03/23/2022    1:26 PM 02/03/2021    4:03 PM  6CIT Screen  What Year? 0 points 0 points 0 points  What month? 0 points 0 points 0 points  What time? 0 points 0 points 0 points  Count back from 20 0 points 0 points 0 points  Months in reverse 0 points 0 points 0 points  Repeat phrase 0 points 0 points 0 points  Total Score 0 points 0 points 0 points    Immunizations Immunization History  Administered Date(s) Administered   Fluad Quad(high Dose 65+) 12/17/2018, 12/23/2019, 12/17/2021   Fluad Trivalent(High Dose 65+) 12/19/2022   Influenza, High Dose Seasonal PF 01/05/2016, 01/15/2018   Influenza,inj,Quad PF,6+ Mos 01/09/2015, 12/26/2016   Influenza-Unspecified 11/19/2020   Moderna SARS-COV2 Booster Vaccination 11/17/2020   Moderna Sars-Covid-2 Vaccination 04/02/2019, 05/03/2019, 01/25/2020   Pneumococcal Conjugate-13 07/18/2016   Pneumococcal Polysaccharide-23 01/09/2015   Td 06/20/1998   Tdap 05/26/2016   Zoster Recombinant(Shingrix) 07/17/2020, 09/22/2020   Zoster, Live 10/24/2005    TDAP status: Up to date  Flu Vaccine status: Up to date  Pneumococcal vaccine status: Up to date  Covid-19 vaccine status:  Information provided on how to obtain vaccines.   Qualifies for Shingles Vaccine? Yes   Zostavax completed Yes   Shingrix Completed?: Yes  Screening Tests Health Maintenance  Topic Date Due   DEXA SCAN  05/20/2007   COVID-19 Vaccine (4 - 2024-25 season) 11/20/2022   Medicare Annual Wellness (AWV)  03/26/2024   DTaP/Tdap/Td (3 - Td or Tdap) 05/27/2026   Pneumonia Vaccine 66+ Years old  Completed   INFLUENZA VACCINE  Completed   Zoster Vaccines- Shingrix  Completed   HPV VACCINES  Aged Out   Colonoscopy  Discontinued   Hepatitis C Screening  Discontinued    Health Maintenance  Health Maintenance Due  Topic Date Due   DEXA SCAN  05/20/2007   COVID-19 Vaccine (4 -  2024-25 season) 11/20/2022    Colorectal cancer screening: No longer required.   Mammogram status: No longer required due to age and preference .  Bone Density status:  Patient declines  Lung Cancer Screening: (Low Dose CT Chest recommended if Age 16-80 years, 20 pack-year currently smoking OR have quit w/in 15years.) does not qualify.   Lung Cancer Screening Referral: n/a  Additional Screening:  Hepatitis C Screening: does not qualify  Vision Screening: Recommended annual ophthalmology exams for early detection of glaucoma and other disorders of the eye. Is the patient up to date with their annual eye exam?  Yes  Who is the provider or what is the name of the office in which the patient attends annual eye exams? Dr. Patrcia If pt is not established with a provider, would they like to be referred to a provider to establish care? No .   Dental Screening: Recommended annual dental exams for proper oral hygiene  Community Resource Referral / Chronic Care Management: CRR required this visit?  No   CCM required this visit?  No     Plan:     I have personally reviewed and noted the following in the patient's chart:   Medical and social history Use of alcohol, tobacco or illicit drugs  Current medications and supplements including opioid prescriptions. Patient is not currently taking opioid prescriptions. Functional ability and status Nutritional status Physical activity Advanced directives List of other physicians Hospitalizations, surgeries, and ER visits in previous 12 months Vitals Screenings to include cognitive, depression, and falls Referrals and appointments  In addition, I have reviewed and discussed with patient certain preventive protocols, quality metrics, and best practice recommendations. A written personalized care plan for preventive services as well as general preventive health recommendations were provided to patient.     Lavelle Pfeiffer [place],  [place]   10/21/7972   After Visit Summary: (MyChart) Due to this being a telephonic visit, the after visit summary with patients personalized plan was offered to patient via MyChart   Nurse Notes: No concerns at this time

## 2023-05-03 NOTE — Progress Notes (Addendum)
NEUROLOGY FOLLOW UP OFFICE NOTE  Autumn Moyer [id number]  Assessment/Plan:   Ocular migraines.  Episodes of "blades of lights"  Diplopia - unclear etiology.  No intracranial abnormality.  Myasthenia panel is negative but will still check anti-Musk antibodies.  I do not suspect this to be migraine.  Possibly inner ear? Benign head tremor    Check anti-Musk antibodies Further recommendations pending results.  Otherwise, follow up as needed.    ADDENDUM:  anti-MuSK antibodies negative.   Subjective:  Autumn Moyer is a 83 year old right-handed female with a fib on Eliquis, CAD and history of aortic valve stenosis s/p TAVR who follows up for transient diplopia.  UPDATE: Due to experiencing recurrent diplopia, myasthenia gravis panel was ordered, which was negative.  Anti-Musk was not checked.  She had hiatal hernia surgery in October.  Since then, she has had change in visual disturbance.  If she moves her head quickly a certain way, her vision becomes blurry and objects may move.  Lasts for about 5 minutes.  May feel a little dizzy.  It has happened while driving.  She has a head tremor which can precipitate it.  It occurs about once a week.  She hasn't had an ocular migraine for quite some time.        HISTORY: Underwent TAVR on 12/14/2021.   On 10/18, she was driving and suddenly couldn't focus.  Didn't last long, just a couple of seconds.  A couple of days later, it occurred again, just a couple of seconds.  The next day, she developed monocular vertical diplopia for 5 minutes.  She had no episodes until this past week, lasting 5 minutes.  Since the surgery, she has had other visual disturbance, described seeing blades of light shooting in her vision lasting a few seconds.  They occur every other day.  No associated slurred speech, facial numbness/weakness, headache, confusion, dizziness, or unilateral numbness or weakness.  She saw her ophthalmologist who didn't find any  abnormalities.  2D echo on 01/17/2022 showed LVEF >75% with Grade II diastolic dysfunction, mild MVR, moderate TVR and normal structure of prosthetic aortic valve.  MRI of brain and MRA of head and neck on 04/21/2022 were all unremarkable without stroke, LVO or hemodynamically significant stenosis.       She has remote history of ocular migraine that resolved after cataract surgery 83 years old.  Visual aura presenting as scintillating fortification scotoma lasting several hours without headache.  She also has remote history of classic migraines as well.  Not for decades.  Then in January 2024, she was at the grocery store when she was looking at the grocery list and it was jumbled (words were jumbled and moving around.  It lasted longer, about 5 minutes.  She still has brief episodes of "blades of light" in her vision lasting a few seconds.  However, the size of the "blades" were smaller.    PAST MEDICAL HISTORY: Past Medical History:  Diagnosis Date   Arthritis    Atrial fibrillation, persistent (HCC)    Complication of anesthesia    Dysrhythmia    A. Fib   GERD (gastroesophageal reflux disease)    Related to Hiatal Hernia   Headache    Localized in bilateral eyes   Heart murmur    TAVR 2023   Hiatal hernia    Hypertension    Hypothyroidism    PONV (postoperative nausea and vomiting)    Pre-diabetes    S/P TAVR (transcatheter  aortic valve replacement) 12/14/2021   s/p TAVR with a 23mm Edwards S3UR via the TF approach by Dr. Lynnette Caffey & Dr. Leafy Ro   Severe aortic stenosis    Squamous cell carcinoma of skin 01/16/2018   in situ-right knee post (txpbx)   Thyroid disease     MEDICATIONS: Current Outpatient Medications on File Prior to Visit  Medication Sig Dispense Refill   acetaminophen (TYLENOL) 160 MG/5ML solution Take 20.3 mLs (650 mg total) by mouth every 6 (six) hours as needed for mild pain (pain score 1-3).     apixaban (ELIQUIS) 5 MG TABS tablet Take 1 tablet (5 mg total) by  mouth 2 (two) times daily. 180 tablet 1   atorvastatin (LIPITOR) 20 MG tablet Take 1 tablet (20 mg total) by mouth daily. (Patient not taking: Reported on 03/27/2023) 90 tablet 3   azithromycin (ZITHROMAX) 500 MG tablet Take 500 mg by mouth once. Before dental or surgery     carboxymethylcellulose (REFRESH TEARS) 0.5 % SOLN Place 2 drops into both eyes 3 (three) times daily as needed (dry eyes).     digoxin (LANOXIN) 0.125 MG tablet Take 1 tablet (125 mcg total) by mouth daily. 90 tablet 3   furosemide (LASIX) 20 MG tablet Take 1 tablet (20 mg total) by mouth daily as needed for edema or fluid. 90 tablet 1   levocetirizine (XYZAL) 5 MG tablet Take 1 tablet (5 mg total) by mouth every evening. 90 tablet 1   levothyroxine (SYNTHROID) 100 MCG tablet Take 1 tablet (100 mcg total) by mouth daily. 90 tablet 1   metoprolol tartrate (LOPRESSOR) 50 MG tablet Take 1 tablet (50 mg total) by mouth 2 (two) times daily. 180 tablet 3   mometasone (ELOCON) 0.1 % cream Apply 1 Application topically daily. (Patient taking differently: Apply 1 Application topically daily as needed (eczema in ears).) 45 g 2   Multiple Vitamin (MULTIVITAMIN WITH MINERALS) TABS tablet Take 1 tablet by mouth every evening.     tacrolimus (PROTOPIC) 0.1 % ointment Apply topically in the morning and at bedtime. 30 g 4   No current facility-administered medications on file prior to visit.    ALLERGIES: Allergies  Allergen Reactions   Noroxin [Norfloxacin] Nausea And Vomiting   Augmentin [Amoxicillin-Pot Clavulanate] Swelling    Gums swelling, lips numb   Rosuvastatin Other (See Comments)    Pain in back of legs    FAMILY HISTORY: Family History  Problem Relation Age of Onset   Migraines Mother    Hypertension Mother    Hyperlipidemia Mother    Heart failure Mother        Age 3   Hyperlipidemia Father    Hypertension Father    Heart disease Father        died of MI after developoing cancer.   Lung cancer Father     Hypertension Sister    Breast cancer Paternal Aunt    Migraines Maternal Grandfather    Migraines Daughter    Breast cancer Daughter       Objective:  Blood pressure (!) 105/58, pulse 83, height 5\' 4"  (1.626 m), weight 135 lb (61.2 kg), SpO2 98%. General: No acute distress.  Patient appears well-groomed.   Head:  Normocephalic/atraumatic Neck:  Supple.  No paraspinal tenderness.  Full range of motion. Heart:  Regular rate and rhythm. Neuro:  Alert and oriented.  Speech fluent and not dysarthric.  Language intact.  CN II-XII intact.  Bulk and tone normal.  Resting head tremor.  Muscle strength 5/5 throughout.  Deep tendon reflexes 2+ throughout.  Gait normal.  Romberg negative.    Shon Millet, DO  CC: Jannifer Rodney, FNP

## 2023-05-04 ENCOUNTER — Encounter: Payer: Self-pay | Admitting: Neurology

## 2023-05-04 ENCOUNTER — Other Ambulatory Visit: Payer: HMO

## 2023-05-04 ENCOUNTER — Ambulatory Visit: Payer: HMO | Admitting: Neurology

## 2023-05-04 VITALS — BP 105/58 | HR 83 | Ht 64.0 in | Wt 135.0 lb

## 2023-05-04 DIAGNOSIS — H532 Diplopia: Secondary | ICD-10-CM

## 2023-05-04 DIAGNOSIS — G43109 Migraine with aura, not intractable, without status migrainosus: Secondary | ICD-10-CM | POA: Diagnosis not present

## 2023-05-04 DIAGNOSIS — G250 Essential tremor: Secondary | ICD-10-CM

## 2023-05-04 NOTE — Patient Instructions (Signed)
Check anti-MUSK antibodies Otherwise, follow up as needed

## 2023-05-09 LAB — MUSK ANTIBODIES

## 2023-05-12 ENCOUNTER — Inpatient Hospital Stay (HOSPITAL_COMMUNITY)
Admission: EM | Admit: 2023-05-12 | Discharge: 2023-05-15 | DRG: 522 | Disposition: A | Discharge status: Home Health | Payer: HMO | Attending: Internal Medicine | Admitting: Internal Medicine

## 2023-05-12 ENCOUNTER — Encounter (HOSPITAL_COMMUNITY): Payer: Self-pay

## 2023-05-12 ENCOUNTER — Emergency Department (HOSPITAL_COMMUNITY): Payer: HMO

## 2023-05-12 DIAGNOSIS — M79605 Pain in left leg: Secondary | ICD-10-CM | POA: Diagnosis not present

## 2023-05-12 DIAGNOSIS — M4802 Spinal stenosis, cervical region: Secondary | ICD-10-CM | POA: Diagnosis not present

## 2023-05-12 DIAGNOSIS — Z79899 Other long term (current) drug therapy: Secondary | ICD-10-CM

## 2023-05-12 DIAGNOSIS — N179 Acute kidney failure, unspecified: Secondary | ICD-10-CM | POA: Diagnosis present

## 2023-05-12 DIAGNOSIS — Z881 Allergy status to other antibiotic agents status: Secondary | ICD-10-CM

## 2023-05-12 DIAGNOSIS — Z85828 Personal history of other malignant neoplasm of skin: Secondary | ICD-10-CM

## 2023-05-12 DIAGNOSIS — Z96642 Presence of left artificial hip joint: Secondary | ICD-10-CM | POA: Diagnosis not present

## 2023-05-12 DIAGNOSIS — I10 Essential (primary) hypertension: Secondary | ICD-10-CM | POA: Diagnosis not present

## 2023-05-12 DIAGNOSIS — Z888 Allergy status to other drugs, medicaments and biological substances status: Secondary | ICD-10-CM | POA: Diagnosis not present

## 2023-05-12 DIAGNOSIS — S72002A Fracture of unspecified part of neck of left femur, initial encounter for closed fracture: Principal | ICD-10-CM | POA: Diagnosis present

## 2023-05-12 DIAGNOSIS — Z83438 Family history of other disorder of lipoprotein metabolism and other lipidemia: Secondary | ICD-10-CM

## 2023-05-12 DIAGNOSIS — I4891 Unspecified atrial fibrillation: Secondary | ICD-10-CM | POA: Diagnosis present

## 2023-05-12 DIAGNOSIS — Y92008 Other place in unspecified non-institutional (private) residence as the place of occurrence of the external cause: Secondary | ICD-10-CM

## 2023-05-12 DIAGNOSIS — Z7901 Long term (current) use of anticoagulants: Secondary | ICD-10-CM

## 2023-05-12 DIAGNOSIS — W1839XA Other fall on same level, initial encounter: Secondary | ICD-10-CM | POA: Diagnosis present

## 2023-05-12 DIAGNOSIS — Z01810 Encounter for preprocedural cardiovascular examination: Secondary | ICD-10-CM

## 2023-05-12 DIAGNOSIS — Z880 Allergy status to penicillin: Secondary | ICD-10-CM

## 2023-05-12 DIAGNOSIS — E860 Dehydration: Secondary | ICD-10-CM | POA: Diagnosis not present

## 2023-05-12 DIAGNOSIS — I4811 Longstanding persistent atrial fibrillation: Secondary | ICD-10-CM | POA: Diagnosis not present

## 2023-05-12 DIAGNOSIS — I4819 Other persistent atrial fibrillation: Secondary | ICD-10-CM | POA: Diagnosis present

## 2023-05-12 DIAGNOSIS — S72092A Other fracture of head and neck of left femur, initial encounter for closed fracture: Principal | ICD-10-CM | POA: Diagnosis present

## 2023-05-12 DIAGNOSIS — Z8249 Family history of ischemic heart disease and other diseases of the circulatory system: Secondary | ICD-10-CM

## 2023-05-12 DIAGNOSIS — E039 Hypothyroidism, unspecified: Secondary | ICD-10-CM | POA: Diagnosis not present

## 2023-05-12 DIAGNOSIS — K219 Gastro-esophageal reflux disease without esophagitis: Secondary | ICD-10-CM | POA: Diagnosis not present

## 2023-05-12 DIAGNOSIS — W010XXA Fall on same level from slipping, tripping and stumbling without subsequent striking against object, initial encounter: Secondary | ICD-10-CM | POA: Diagnosis not present

## 2023-05-12 DIAGNOSIS — R7303 Prediabetes: Secondary | ICD-10-CM | POA: Diagnosis present

## 2023-05-12 DIAGNOSIS — Z79890 Hormone replacement therapy (postmenopausal): Secondary | ICD-10-CM | POA: Diagnosis not present

## 2023-05-12 DIAGNOSIS — Z801 Family history of malignant neoplasm of trachea, bronchus and lung: Secondary | ICD-10-CM

## 2023-05-12 DIAGNOSIS — E876 Hypokalemia: Secondary | ICD-10-CM | POA: Diagnosis not present

## 2023-05-12 DIAGNOSIS — Z91048 Other nonmedicinal substance allergy status: Secondary | ICD-10-CM | POA: Diagnosis not present

## 2023-05-12 DIAGNOSIS — E781 Pure hyperglyceridemia: Secondary | ICD-10-CM | POA: Diagnosis not present

## 2023-05-12 DIAGNOSIS — W19XXXA Unspecified fall, initial encounter: Secondary | ICD-10-CM | POA: Diagnosis not present

## 2023-05-12 DIAGNOSIS — M25552 Pain in left hip: Secondary | ICD-10-CM | POA: Diagnosis present

## 2023-05-12 DIAGNOSIS — R42 Dizziness and giddiness: Secondary | ICD-10-CM | POA: Diagnosis not present

## 2023-05-12 DIAGNOSIS — Z471 Aftercare following joint replacement surgery: Secondary | ICD-10-CM | POA: Diagnosis not present

## 2023-05-12 DIAGNOSIS — Z952 Presence of prosthetic heart valve: Secondary | ICD-10-CM | POA: Diagnosis not present

## 2023-05-12 DIAGNOSIS — I482 Chronic atrial fibrillation, unspecified: Secondary | ICD-10-CM | POA: Diagnosis not present

## 2023-05-12 DIAGNOSIS — M25562 Pain in left knee: Secondary | ICD-10-CM | POA: Diagnosis not present

## 2023-05-12 DIAGNOSIS — M48061 Spinal stenosis, lumbar region without neurogenic claudication: Secondary | ICD-10-CM | POA: Diagnosis not present

## 2023-05-12 DIAGNOSIS — Z803 Family history of malignant neoplasm of breast: Secondary | ICD-10-CM | POA: Diagnosis not present

## 2023-05-12 DIAGNOSIS — D62 Acute posthemorrhagic anemia: Secondary | ICD-10-CM | POA: Diagnosis not present

## 2023-05-12 DIAGNOSIS — Z953 Presence of xenogenic heart valve: Secondary | ICD-10-CM

## 2023-05-12 DIAGNOSIS — S72042A Displaced fracture of base of neck of left femur, initial encounter for closed fracture: Secondary | ICD-10-CM | POA: Diagnosis not present

## 2023-05-12 LAB — TSH: TSH: 0.164 u[IU]/mL — ABNORMAL LOW (ref 0.350–4.500)

## 2023-05-12 LAB — CBG MONITORING, ED: Glucose-Capillary: 109 mg/dL — ABNORMAL HIGH (ref 70–99)

## 2023-05-12 LAB — BASIC METABOLIC PANEL
Anion gap: 14 (ref 5–15)
BUN: 20 mg/dL (ref 8–23)
CO2: 25 mmol/L (ref 22–32)
Calcium: 9.7 mg/dL (ref 8.9–10.3)
Chloride: 100 mmol/L (ref 98–111)
Creatinine, Ser: 1.22 mg/dL — ABNORMAL HIGH (ref 0.44–1.00)
GFR, Estimated: 45 mL/min — ABNORMAL LOW (ref >60)
Glucose, Bld: 127 mg/dL — ABNORMAL HIGH (ref 70–99)
Potassium: 3.4 mmol/L — ABNORMAL LOW (ref 3.5–5.1)
Sodium: 139 mmol/L (ref 135–145)

## 2023-05-12 LAB — CBC
HCT: 38.7 % (ref 36.0–46.0)
Hemoglobin: 12.9 g/dL (ref 12.0–15.0)
MCH: 28.8 pg (ref 26.0–34.0)
MCHC: 33.3 g/dL (ref 30.0–36.0)
MCV: 86.4 fL (ref 80.0–100.0)
Platelets: 225 10*3/uL (ref 150–400)
RBC: 4.48 MIL/uL (ref 3.87–5.11)
RDW: 13.3 % (ref 11.5–15.5)
WBC: 9.8 10*3/uL (ref 4.0–10.5)
nRBC: 0 % (ref 0.0–0.2)

## 2023-05-12 LAB — DIGOXIN LEVEL: Digoxin Level: 1.1 ng/mL (ref 0.8–2.0)

## 2023-05-12 LAB — MAGNESIUM: Magnesium: 1.8 mg/dL (ref 1.7–2.4)

## 2023-05-12 MED ORDER — FENTANYL CITRATE PF 50 MCG/ML IJ SOSY: 12.5000 ug | PREFILLED_SYRINGE | INTRAMUSCULAR | Status: DC | PRN

## 2023-05-12 MED ORDER — POTASSIUM CHLORIDE 20 MEQ PO PACK
40.0000 meq | PACK | Freq: Once | ORAL | Status: AC
  Administered 2023-05-12: 40 meq via ORAL
  Filled 2023-05-12: qty 2

## 2023-05-12 MED ORDER — LACTATED RINGERS IV BOLUS
500.0000 mL | Freq: Once | INTRAVENOUS | Status: AC
  Administered 2023-05-12: 500 mL via INTRAVENOUS

## 2023-05-12 MED ORDER — HYDROMORPHONE HCL 1 MG/ML IJ SOLN
0.2500 mg | INTRAMUSCULAR | Status: DC | PRN
  Administered 2023-05-13 (×2): 0.25 mg via INTRAVENOUS
  Filled 2023-05-12 (×3): qty 0.5

## 2023-05-12 MED ORDER — HYDROMORPHONE HCL 1 MG/ML IJ SOLN
0.5000 mg | Freq: Once | INTRAMUSCULAR | Status: AC
  Administered 2023-05-12: 0.5 mg via INTRAVENOUS
  Filled 2023-05-12: qty 0.5

## 2023-05-12 MED ORDER — ONDANSETRON HCL 4 MG/2ML IJ SOLN
4.0000 mg | Freq: Once | INTRAMUSCULAR | Status: AC
  Administered 2023-05-12: 4 mg via INTRAVENOUS
  Filled 2023-05-12: qty 2

## 2023-05-12 MED ORDER — POVIDONE-IODINE 10 % EX SWAB: 2.0000 | Freq: Once | CUTANEOUS | Status: DC

## 2023-05-12 MED ORDER — CEFAZOLIN SODIUM-DEXTROSE 2-4 GM/100ML-% IV SOLN: 2.0000 g | INTRAVENOUS | Status: DC

## 2023-05-12 MED ORDER — MUPIROCIN 2 % EX OINT
1.0000 | TOPICAL_OINTMENT | Freq: Two times a day (BID) | CUTANEOUS | Status: DC
  Administered 2023-05-13: 1 via NASAL
  Filled 2023-05-12: qty 22

## 2023-05-12 MED ORDER — TRANEXAMIC ACID-NACL 1000-0.7 MG/100ML-% IV SOLN: 1000.0000 mg | INTRAVENOUS | Status: DC

## 2023-05-12 MED ORDER — CHLORHEXIDINE GLUCONATE 4 % EX SOLN: 60.0000 mL | Freq: Once | CUTANEOUS | Status: DC

## 2023-05-12 MED ORDER — FENTANYL CITRATE PF 50 MCG/ML IJ SOSY
25.0000 ug | PREFILLED_SYRINGE | Freq: Once | INTRAMUSCULAR | Status: AC | PRN
  Administered 2023-05-12: 25 ug via INTRAVENOUS
  Filled 2023-05-12: qty 1

## 2023-05-12 MED ORDER — VANCOMYCIN HCL IN DEXTROSE 1-5 GM/200ML-% IV SOLN: 1000.0000 mg | INTRAVENOUS | Status: DC

## 2023-05-12 MED ORDER — HYDROMORPHONE HCL 1 MG/ML IJ SOLN
0.5000 mg | Freq: Once | INTRAMUSCULAR | Status: AC
  Administered 2023-05-12: 0.5 mg via INTRAVENOUS
  Filled 2023-05-12: qty 1

## 2023-05-12 NOTE — ED Triage Notes (Addendum)
Pt BIB [facility] for fall outside caused by dizziness after getting out of car to walk into house.  Fell on left side, complaining of left hip pain. Mild shortening.  Pain with bumps in truck.  PT is A&O x4  Pt is on Eliquis, not LOC, did not hit head.    137/50 HR 85 (afib), CBG 135 100@ RA.

## 2023-05-12 NOTE — Progress Notes (Signed)
[facility]   Consult received for Left femoral neck fracture 83 y/o female s/p fall, on eliquis  Pt will need with hemiarthroplasty vs THA  Full consult to follow   Obtain xrays of L knee   Most recent echo 11/2022 with EF of 60-65% S/p TVAR 11/2021  Cards consult pre-op as well   Will make NPO after MN   Mearl Latin, PA-C [phone] (C) 05/12/2023, 7:52 PM  [facility] [address] [place] [address] [phone] Collier Bullock (F)      Patient ID: Autumn Moyer, female   DOB: 1940/11/21, 83 y.o.   MRN: [id number]

## 2023-05-12 NOTE — H&P (Addendum)
History and Physical    Patient: Autumn Moyer UJW:[id number] DOB: 10/20/1940 DOA: 05/12/2023 DOS: the patient was seen and examined on 05/12/2023 PCP: [name], FNP  Patient coming from: Home  Chief Complaint:  Chief Complaint  Patient presents with   Fall   HPI: Autumn Moyer is a 83 y.o. female with medical history significant for atrial fibrillation on Eliquis, hypothyroidism, TAVR in 2023, and Hiatal hernia repair 2024 presented after falling in the yard today.  After her here appointment this afternoon she felt like she was being pulled to one side while walking.  She held on for minute and it seemed to get better.  She was able to drive without any problem.  She got out of the car to help her sister into the house initially feeling fine but then she says she felt like her head weighed 100 pounds and had pulled her over and she ended up on the ground.  She says she has felt this sensation before but not so intensely.  She has had vertigo in the past as well but that was a slightly different spinning sensation.  She did not lose consciousness but was not able to walk.  Evaluation in the emergency department revealed a left displaced neck fracture.  She has been seen by orthopedics and will require surgical repair.  The patient denies any chest pain or change in her baseline shortness of breath but says her head still feels not normal.    Review of Systems: As mentioned in the history of present illness. All other systems reviewed and are negative. Past Medical History:  Diagnosis Date   Arthritis    Atrial fibrillation, persistent (HCC)    Complication of anesthesia    Dysrhythmia    A. Fib   GERD (gastroesophageal reflux disease)    Related to Hiatal Hernia   Headache    Localized in bilateral eyes   Heart murmur    TAVR 2023   Hiatal hernia    Hypertension    Hypothyroidism    PONV (postoperative nausea and vomiting)    Pre-diabetes    S/P TAVR (transcatheter aortic  valve replacement) 12/14/2021   s/p TAVR with a 23mm Edwards S3UR via the TF approach by Dr. Lynnette Caffey & Dr. Leafy Ro   Severe aortic stenosis    Squamous cell carcinoma of skin 01/16/2018   in situ-right knee post (txpbx)   Thyroid disease    Past Surgical History:  Procedure Laterality Date   ABDOMINAL AORTOGRAM N/A 09/22/2021   Procedure: ABDOMINAL AORTOGRAM;  Surgeon: Orbie Pyo, MD;  Location: [facility];  Service: Cardiovascular;  Laterality: N/A;   ESOPHAGOGASTRODUODENOSCOPY N/A 01/09/2023   Procedure: ESOPHAGOGASTRODUODENOSCOPY (EGD);  Surgeon: Corliss Skains, MD;  Location: [facility] OR;  Service: Thoracic;  Laterality: N/A;   EYE SURGERY     INTRAOPERATIVE TRANSTHORACIC ECHOCARDIOGRAM N/A 12/14/2021   Procedure: INTRAOPERATIVE TRANSTHORACIC ECHOCARDIOGRAM;  Surgeon: Orbie Pyo, MD;  Location: [facility] OR;  Service: Open Heart Surgery;  Laterality: N/A;   MULTIPLE EXTRACTIONS WITH ALVEOLOPLASTY N/A 12/02/2021   Procedure: MULTIPLE EXTRACTION WITH ALVEOLOPLASTY;  Surgeon: Sharman Cheek, DMD;  Location: [facility] OR;  Service: Dentistry;  Laterality: N/A;   RIGHT HEART CATH AND CORONARY/GRAFT ANGIOGRAPHY N/A 09/22/2021   Procedure: RIGHT HEART CATH AND CORONARY/GRAFT ANGIOGRAPHY;  Surgeon: Orbie Pyo, MD;  Location: [facility];  Service: Cardiovascular;  Laterality: N/A;   TOE SURGERY Right    Hammer Toes   TRANSCATHETER  AORTIC VALVE REPLACEMENT, TRANSFEMORAL N/A 12/14/2021   Procedure: Transcatheter Aortic Valve Replacement, Transfemoral using Edwards 23 MM SAPIEN 3 Ultra;  Surgeon: Orbie Pyo, MD;  Location: [facility] OR;  Service: Open Heart Surgery;  Laterality: N/A;  Transfemoral approach   TUBAL LIGATION  1986   XI ROBOTIC ASSISTED HIATAL HERNIA REPAIR N/A 01/09/2023   Procedure: XI ROBOTIC ASSISTED HIATAL HERNIA REPAIR WITH FUNDOPLICATION;  Surgeon: Corliss Skains, MD;  Location: [facility] OR;  Service: Thoracic;  Laterality: N/A;   Social History:   reports that she has never smoked. She has been exposed to tobacco smoke. She has never used smokeless tobacco. She reports that she does not drink alcohol and does not use drugs.  Allergies  Allergen Reactions   Noroxin [Norfloxacin] Nausea And Vomiting   Augmentin [Amoxicillin-Pot Clavulanate] Swelling    Gums swelling, lips numb   Rosuvastatin Other (See Comments)    Pain in back of legs    Family History  Problem Relation Age of Onset   Migraines Mother    Hypertension Mother    Hyperlipidemia Mother    Heart failure Mother        Age 97   Hyperlipidemia Father    Hypertension Father    Heart disease Father        died of MI after developoing cancer.   Lung cancer Father    Hypertension Sister    Breast cancer Paternal Aunt    Migraines Maternal Grandfather    Migraines Daughter    Breast cancer Daughter     Prior to Admission medications   Medication Sig Start Date End Date Taking? Authorizing Provider  acetaminophen (TYLENOL) 160 MG/5ML solution Take 20.3 mLs (650 mg total) by mouth every 6 (six) hours as needed for mild pain (pain score 1-3). 01/12/23   Stehler, Oren Bracket, PA-C  apixaban (ELIQUIS) 5 MG TABS tablet Take 1 tablet (5 mg total) by mouth 2 (two) times daily. 12/19/22   [name], FNP  atorvastatin (LIPITOR) 20 MG tablet Take 1 tablet (20 mg total) by mouth daily. 12/30/22   [name], FNP  azithromycin (ZITHROMAX) 500 MG tablet Take 500 mg by mouth once. Before dental or surgery 11/24/22   [provider]  carboxymethylcellulose (REFRESH TEARS) 0.5 % SOLN Place 2 drops into both eyes 3 (three) times daily as needed (dry eyes).    [provider]  digoxin (LANOXIN) 0.125 MG tablet Take 1 tablet (125 mcg total) by mouth daily. 12/19/22   [name], FNP  furosemide (LASIX) 20 MG tablet Take 1 tablet (20 mg total) by mouth daily as needed for edema or fluid. 12/19/22   [name], FNP  levocetirizine (XYZAL) 5 MG tablet Take  1 tablet (5 mg total) by mouth every evening. 12/19/22   [name], FNP  levothyroxine (SYNTHROID) 100 MCG tablet Take 1 tablet (100 mcg total) by mouth daily. 12/12/22 12/12/23  [name], FNP  metoprolol tartrate (LOPRESSOR) 50 MG tablet Take 1 tablet (50 mg total) by mouth 2 (two) times daily. 12/19/22   Hawks, Neysa Bonito A, FNP  mometasone (ELOCON) 0.1 % cream Apply 1 Application topically daily. Patient taking differently: Apply 1 Application topically daily as needed (eczema in ears). 12/19/22   [name], FNP  Multiple Vitamin (MULTIVITAMIN WITH MINERALS) TABS tablet Take 1 tablet by mouth every evening.    [provider]  tacrolimus (PROTOPIC) 0.1 % ointment Apply topically in the morning and at  bedtime. 12/19/22   [name], FNP    Physical Exam: Vitals:   05/12/23 1615 05/12/23 1625 05/12/23 1926 05/12/23 1928  BP: 130/68  128/62   Pulse: (!) 50  93   Resp: 20  16   Temp:  98.2 F (36.8 C)  97.9 F (36.6 C)  TempSrc:  Oral  Oral  SpO2: 100%  100%   Weight:      Height:       Physical Exam:  General: No acute distress, thin, frail HEENT: Normocephalic, atraumatic, Pupils surgically irregular Cardiovascular: Normal rhythm, irregular. Distal pulses faint. Pulmonary: Normal pulmonary effort, normal breath sounds Gastrointestinal: Nondistended abdomen, soft, non-tender, normoactive bowel sounds Musculoskeletal:No lower ext edema Skin: Skin is warm and dry. Neuro: strength deferred, AAOx3. PSYCH: Attentive and cooperative  Data Reviewed:  Results for orders placed or performed during the hospital encounter of 05/12/23 (from the past 24 hours)  CBG monitoring, ED     Status: Abnormal   Collection Time: 05/12/23  3:58 PM  Result Value Ref Range   Glucose-Capillary 109 (H) 70 - 99 mg/dL  Basic metabolic panel     Status: Abnormal   Collection Time: 05/12/23  4:13 PM  Result Value Ref Range   Sodium 139 135 - 145 mmol/L   Potassium 3.4 (L)  3.5 - 5.1 mmol/L   Chloride 100 98 - 111 mmol/L   CO2 25 22 - 32 mmol/L   Glucose, Bld 127 (H) 70 - 99 mg/dL   BUN 20 8 - 23 mg/dL   Creatinine, Ser 1.61 (H) 0.44 - 1.00 mg/dL   Calcium 9.7 8.9 - 09.6 mg/dL   GFR, Estimated 45 (L) >60 mL/min   Anion gap 14 5 - 15  CBC     Status: None   Collection Time: 05/12/23  4:13 PM  Result Value Ref Range   WBC 9.8 4.0 - 10.5 K/uL   RBC 4.48 3.87 - 5.11 MIL/uL   Hemoglobin 12.9 12.0 - 15.0 g/dL   HCT 04.5 40.9 - 81.1 %   MCV 86.4 80.0 - 100.0 fL   MCH 28.8 26.0 - 34.0 pg   MCHC 33.3 30.0 - 36.0 g/dL   RDW 91.4 78.2 - 95.6 %   Platelets 225 150 - 400 K/uL   nRBC 0.0 0.0 - 0.2 %     Assessment and Plan: Left hip fracture -the patient has been seen by orthopedics and surgical repair is planned for tomorrow.  It may need to be in the afternoon after cardiology has the opportunity to evaluate her for clearance. - Pain control 2. Dizzines - Will give IV fluids and monitor.  Head CT was ok 3. Afib - rate is controlled. Hold Eliquis.  Her last dose was this am. 4. Hypokalemia - replace.  Check magnesium    Advance Care Planning:   Code Status: Prior the patient names her husband is her surrogate decision-maker and wants to be full code.  Consults: Ortho  Family Communication: Husband at bedside  Severity of Illness: The appropriate patient status for this patient is INPATIENT. Inpatient status is judged to be reasonable and necessary in order to provide the required intensity of service to ensure the patient's safety. The patient's presenting symptoms, physical exam findings, and initial radiographic and laboratory data in the context of their chronic comorbidities is felt to place them at high risk for further clinical deterioration. Furthermore, it is not anticipated that the patient will be medically stable for discharge from the  hospital within 2 midnights of admission.   * I certify that at the point of admission it is my clinical  judgment that the patient will require inpatient hospital care spanning beyond 2 midnights from the point of admission due to high intensity of service, high risk for further deterioration and high frequency of surveillance required.*  Author: Buena Irish, MD 05/12/2023 9:18 PM  For on call review [email or web address].

## 2023-05-12 NOTE — Progress Notes (Signed)
LMOVM for patient to call the office.

## 2023-05-12 NOTE — Consult Note (Addendum)
Orthopaedic Trauma Service (OTS) Consult   Patient ID: Autumn Moyer MRN: [id number] DOB/AGE: May 09, 1940 83 y.o.   Reason for Consult: Left Femoral neck fracture  Referring Physician:  Freda Munro, MD  Cardiologist: Rollene Rotunda, MD  PCP: Jannifer Rodney, FNP   HPI: Autumn Moyer is an 82 y.o. female with history of A-fib on Eliquis, s/p TAVR, hiatal hernia repair, hypertension, and hypothyroidism who sustained a ground-level fall this afternoon.  Patient with his her sister when she felt dizzy and fell on her left hip while she was outside.  Patient had immediate onset of pain and inability bear weight.  She was brought to [facility] for evaluation was found to have a left femoral neck fracture.  Orthopedics consulted for evaluation regarding her hip fracture  Patient was seen in the emergency room room 18.  Her husband and daughter are present.  She is very pleasant reports pain isolated to her left hip but states that her left leg does feel a little cool at times it is intermittent.  Denies any additional injuries elsewhere.  She lives with her husband.  She does not use any assistive devices.  She drives and does housework including going up and down stairs, cooking and cleaning.  She drives and goes out into town every day.  Pt had some previous SOB that has improved following hiatal hernia repair.  Last office visit with [facility] noted some fluid in the hernia sac, pt to follow up with cardiothoracic surgery for a few months for further eval    Past Medical History:  Diagnosis Date   Arthritis    Atrial fibrillation, persistent (HCC)    Complication of anesthesia    Dysrhythmia    A. 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 Lynnette Caffey & Dr. Leafy Ro   Severe aortic stenosis    Squamous cell carcinoma of skin 01/16/2018   in situ-right knee post (txpbx)   Thyroid disease     Past Surgical History:  Procedure Laterality Date   ABDOMINAL AORTOGRAM N/A 09/22/2021   Procedure: ABDOMINAL AORTOGRAM;  Surgeon: Orbie Pyo, MD;  [facility];  Service: Cardiovascular;  Laterality: N/A;   ESOPHAGOGASTRODUODENOSCOPY N/A 01/09/2023   Procedure: ESOPHAGOGASTRODUODENOSCOPY (EGD);  Surgeon: Corliss Skains, MD;  Location: [facility] OR;  Service: Thoracic;  Laterality: N/A;   EYE SURGERY     INTRAOPERATIVE TRANSTHORACIC ECHOCARDIOGRAM N/A 12/14/2021   Procedure: INTRAOPERATIVE TRANSTHORACIC ECHOCARDIOGRAM;  Surgeon: Orbie Pyo, MD;  Location: [facility] OR;  Service: Open Heart Surgery;  Laterality: N/A;   MULTIPLE EXTRACTIONS WITH ALVEOLOPLASTY N/A 12/02/2021   Procedure: MULTIPLE EXTRACTION WITH ALVEOLOPLASTY;  Surgeon: Sharman Cheek, DMD;  Location: [facility] OR;  Service: Dentistry;  Laterality: N/A;   RIGHT HEART CATH AND CORONARY/GRAFT ANGIOGRAPHY N/A 09/22/2021   Procedure: RIGHT HEART CATH AND CORONARY/GRAFT ANGIOGRAPHY;  Surgeon: Orbie Pyo, MD;  [facility]: [facility] INV[facility] CV LAB;  Service: Cardiovascular;  Laterality: N/A;   TOE SURGERY Right    Hammer Toes   TRANSCATHETER  AORTIC VALVE REPLACEMENT, TRANSFEMORAL N/A 12/14/2021   Procedure: Transcatheter Aortic Valve Replacement, Transfemoral using Edwards 23 MM SAPIEN 3 Ultra;  Surgeon: Orbie Pyo, MD;  Location: [facility] OR;  Service: Open Heart Surgery;  Laterality: N/A;  Transfemoral approach   TUBAL LIGATION  1986   XI ROBOTIC ASSISTED HIATAL HERNIA REPAIR N/A 01/09/2023   Procedure: XI ROBOTIC ASSISTED HIATAL HERNIA REPAIR WITH FUNDOPLICATION;  Surgeon: Corliss Skains, MD;  Location: [facility] OR;  Service: Thoracic;  Laterality: N/A;     Family History  Problem Relation Age of Onset   Migraines Mother    Hypertension Mother    Hyperlipidemia Mother    Heart failure Mother        Age 72   Hyperlipidemia Father    Hypertension Father    Heart disease Father        died of MI after developoing cancer.   Lung cancer Father    Hypertension Sister    Breast cancer Paternal Aunt    Migraines Maternal Grandfather    Migraines Daughter    Breast cancer Daughter     Social History:  reports that she has never smoked. She has been exposed to tobacco smoke. She has never used smokeless tobacco. She reports that she does not drink alcohol and does not use drugs.  Allergies:  Allergies  Allergen Reactions   Noroxin [Norfloxacin] Nausea And Vomiting   Augmentin [Amoxicillin-Pot Clavulanate] Swelling    Gums swelling, lips numb   Rosuvastatin Other (See Comments)    Pain in back of legs    Medications: I have reviewed the patient's current medications. Current Outpatient Medications  Medication Instructions   acetaminophen (TYLENOL) 650 mg, Oral, Every 6 hours PRN   apixaban (ELIQUIS) 5 mg, Oral, 2 times daily   atorvastatin (LIPITOR) 20 mg, Oral, Daily   azithromycin (ZITHROMAX) 500 mg,  Once   carboxymethylcellulose (REFRESH TEARS) 0.5 % SOLN 2 drops, 3 times daily PRN   digoxin (LANOXIN) 125 mcg, Oral, Daily   furosemide (LASIX) 20 mg, Oral, Daily PRN   levocetirizine (XYZAL) 5 mg, Oral, Every evening   levothyroxine (SYNTHROID) 100 mcg, Oral, Daily   metoprolol tartrate (LOPRESSOR) 50 mg, Oral, 2 times daily   mometasone (ELOCON) 0.1 % cream 1 Application, Topical, Daily   Multiple Vitamin (MULTIVITAMIN WITH MINERALS) TABS tablet 1 tablet, Every evening   tacrolimus (PROTOPIC) 0.1 % ointment Topical, 2 times daily     Results for orders placed or performed during the hospital encounter of 05/12/23 (from the past 48 hours)  CBG monitoring, ED     Status: Abnormal   Collection Time: 05/12/23  3:58 PM   Result Value Ref Range   Glucose-Capillary 109 (H) 70 - 99 mg/dL    Comment: Glucose reference range applies only to samples taken after fasting for at least 8 hours.  Basic metabolic panel     Status: Abnormal   Collection Time: 05/12/23  4:13 PM  Result Value Ref Range   Sodium 139 135 - 145 mmol/L   Potassium 3.4 (L) 3.5 - 5.1 mmol/L   Chloride 100 98 - 111 mmol/L   CO2 25 22 - 32 mmol/L   Glucose, Bld 127 (H) 70 - 99 mg/dL    Comment: Glucose reference range applies only to samples taken after fasting for at least 8 hours.   BUN 20 8 - 23 mg/dL   Creatinine, Ser 1.61 (H) 0.44 - 1.00 mg/dL   Calcium 9.7 8.9 - 09.6 mg/dL  GFR, Estimated 45 (L) >60 mL/min    Comment: (NOTE) Calculated using the CKD-EPI Creatinine Equation (2021)    Anion gap 14 5 - 15    Comment: Performed at [facility], [address], [place], [place] [address]  CBC     Status: None   Collection Time: 05/12/23  4:13 PM  Result Value Ref Range   WBC 9.8 4.0 - 10.5 K/uL   RBC 4.48 3.87 - 5.11 MIL/uL   Hemoglobin 12.9 12.0 - 15.0 g/dL   HCT 29.5 62.1 - 30.8 %   MCV 86.4 80.0 - 100.0 fL   MCH 28.8 26.0 - 34.0 pg   MCHC 33.3 30.0 - 36.0 g/dL   RDW 65.7 84.6 - 96.2 %   Platelets 225 150 - 400 K/uL   nRBC 0.0 0.0 - 0.2 %    Comment: Performed at [facility], [address], [place], [place] [address]    DG Knee Left Port Result Date: 05/12/2023 CLINICAL DATA:  Fall, left knee and leg pain EXAM: PORTABLE LEFT KNEE - 1-2 VIEW COMPARISON:  None Available. FINDINGS: No evidence of fracture, dislocation, or joint effusion. No evidence of arthropathy or other focal bone abnormality. Soft tissues are unremarkable. IMPRESSION: Negative. Electronically Signed   By: Charlett Nose M.D.   On: 05/12/2023 20:24   CT Head Wo Contrast Result Date: 05/12/2023 CLINICAL DATA:  Acute onset of dizziness after getting out of a car. The patient fell with left-sided injuries. EXAM: CT HEAD WITHOUT CONTRAST TECHNIQUE:  Contiguous axial images were obtained from the base of the skull through the vertex without intravenous contrast. RADIATION DOSE REDUCTION: This exam was performed according to the departmental dose-optimization program which includes automated exposure control, adjustment of the mA and/or kV according to patient size and/or use of iterative reconstruction technique. COMPARISON:  MR head without contrast 04/21/2022. FINDINGS: Brain: No acute infarct, hemorrhage, or mass lesion is present. No significant white matter lesions are present. Deep brain nuclei are within normal limits. The ventricles are of normal size. No significant extraaxial fluid collection is present. The brainstem and cerebellum are within normal limits. Midline structures are within normal limits. Vascular: No hyperdense vessel or unexpected calcification. Skull: Calvarium is intact. No focal lytic or blastic lesions are present. No significant extracranial soft tissue lesion is present. Sinuses/Orbits: The paranasal sinuses and mastoid air cells are clear. Bilateral lens replacements are noted. Globes and orbits are otherwise unremarkable. IMPRESSION: Negative CT of the head. Electronically Signed   By: Marin Roberts M.D.   On: 05/12/2023 18:56   CT Cervical Spine Wo Contrast Result Date: 05/12/2023 CLINICAL DATA:  Fall. Patient is on Eliquis. Patient became dizzy after getting out of a car. EXAM: CT CERVICAL SPINE WITHOUT CONTRAST TECHNIQUE: Multidetector CT imaging of the cervical spine was performed without intravenous contrast. Multiplanar CT image reconstructions were also generated. RADIATION DOSE REDUCTION: This exam was performed according to the departmental dose-optimization program which includes automated exposure control, adjustment of the mA and/or kV according to patient size and/or use of iterative reconstruction technique. COMPARISON:  None Available. FINDINGS: Alignment: No significant listhesis is present. Normal  cervical lordosis is present. Skull base and vertebrae: Craniocervical junction is within normal limits. The vertebral body heights are normal. No acute fractures are present. Soft tissues and spinal canal: No prevertebral fluid or swelling. No visible canal hematoma. Disc levels: Significant focal disc protrusion or stenosis is present. Uncovertebral spurring contributes to mild foraminal narrowing, left  greater than right at C5-6. Upper chest: The lung apices are clear. The thoracic inlet is within normal limits. IMPRESSION: 1. No acute fracture or traumatic subluxation. 2. Mild foraminal narrowing, left greater than right at C5-6. Electronically Signed   By: Marin Roberts M.D.   On: 05/12/2023 18:53   DG Hip Unilat With Pelvis 2-3 Views Left Result Date: 05/12/2023 CLINICAL DATA:  Left hip pain after fall. EXAM: DG HIP (WITH OR WITHOUT PELVIS) 2-3V LEFT COMPARISON:  None Available. FINDINGS: Moderately displaced proximal left femoral neck fracture is noted. IMPRESSION: Moderately displaced proximal left femoral neck fracture. Electronically Signed   By: Lupita Raider M.D.   On: 05/12/2023 16:21    Intake/Output    None      Review of Systems  Constitutional:  Negative for chills and fever.  Eyes:  Negative for double vision.  Cardiovascular:  Negative for chest pain.  Gastrointestinal:  Negative for abdominal pain and nausea.  Musculoskeletal:  Positive for joint pain (Left hip pain).   Blood pressure 128/62, pulse 93, temperature 97.9 F (36.6 C), temperature source Oral, resp. rate 16, height 5\' 4"  (1.626 m), weight 62.1 kg, SpO2 100%. Physical Exam Vitals and nursing note reviewed.  Constitutional:      General: She is awake. She is not in acute distress.    Appearance: Normal appearance. She is normal weight.     Comments: Very pleasant 83 y/o female, NAD  Eyes:     Extraocular Movements: Extraocular movements intact.  Cardiovascular:     Comments: Symmetric DP pulses  noted  Pulmonary:     Comments: No increased work of breathing  Musculoskeletal:     Comments:  Pelvis/Left Lower Extremity  Pelvis is stable with lateral compression, no pain with bony exam  No significant shortening or rotation of left leg   Soft tissue envelop is in good condition  No appreciable knee effusion clinically EHL, FHL, lesser toe motor intact Ankle flexion, extension, inversion and eversion intact No tenderness to foot, ankle, lower leg or knee + DP pulse, symmetric to contra-lateral leg  Did not manipulate L hip due to known fracture  Skin with changes consistent with PVD distally     Neurological:     Mental Status: She is alert.  Psychiatric:        Behavior: Behavior is cooperative.            Assessment/Plan:  83 year-old female ground-level fall with left femoral neck fracture  -ground level fall  - displaced left femoral neck fracture in independent community Ambulator Discussed various treatment options.  Based off family's description I think patient would benefit from a total hip arthroplasty as opposed to hemiarthroplasty We will discuss with one of our total joint colleagues  Pt is currently relatively comfortable, no need for bucks traction, can   Patient will need cardiac clearance preoperatively.  Best case scenario is that we could proceed with surgery tomorrow afternoon if total joint surgeon available  Patient can continue bedrest for now and will start therapies postoperatively  She desires to return home after the hospital and has adequate help from her daughters and grandchildren and some limited help from her husband.  - Pain management:  Multimodal  Minimize narcotics - ABL anemia/Hemodynamics  Monitor - Medical issues   Her primary - DVT/PE prophylaxis:  Hold anticoagulation - ID:   Perioperative antibiotics - Metabolic Bone Disease:  This is a fragility fracture and indicative of osteoporosis  Most recent  DEXA scan I  see in the epic system is from 2007 and I am unable to access those results  - Activity:  Bedrest for now  Therapies to start postoperatively  - FEN/GI prophylaxis/Foley/Lines:  Okay to eat  N.p.o. after midnight  - Dispo:  Patient will need surgical intervention for her left femoral neck fracture  We are hopeful that cardiology will see her in the morning and can proceed with surgery in the afternoon.   Mearl Latin, PA-C [phone] (C) 05/12/2023, 8:59 PM  [facility] [address] [place] [address] [phone] [phone] (F)    After 5pm and on the weekends please log on to Amion, go to orthopaedics and the look under the [facility] Call for the provider(s) on call. You can also call our office at [phone] and then follow the prompts to be connected to the call team.

## 2023-05-12 NOTE — ED Notes (Signed)
5n notified pt is being transported

## 2023-05-12 NOTE — ED Provider Notes (Signed)
 [place] EMERGENCY DEPARTMENT AT [facility] Provider Note  Arrival date/time:05/12/2023 8:40 PM  HPI/ROS   Autumn Moyer is a 83 y.o. female with PMH significant for hypothyroidism, HTN, GERD, A-fib on Eliquis who presents for fall History is provided by patient. Patient was walking outside with her family when she felt an onset of vertigo and fell down onto her left side onto the grass.  She felt immediate hip pain and has not been able to use her left leg ever since.  She denies any head trauma or syncope. Denies pain anywhere else. She endorses that she does get frequent episodes of vertigo, but this is the first time that she has fallen down from them.   A complete ROS was performed with pertinent positives/negatives noted above.   ED Course and Medical Decision Making   I personally reviewed the patient's vitals.  Assessment/Plan: This is an 83 year old patient who is presenting for a ground-level fall.  On exam, she is well-appearing with an obvious deformity of the left hip. Her lab workup shows a very mild AKI with creatinine of 1.2, otherwise no metabolic derangement.  CBC showed no leukocytosis or anemia.   An x-ray of the pelvis showed acute fracture of the left femoral neck. CT head and C-spine were obtained due to patient fall on Eliquis, although she did deny head trauma. These scans were reassuring and did not show any acute traumatic injuries.  Patient was given IV fluids for her AKI as well as Dilaudid and Zofran for symptom management.  I consulted orthopedic surgery who recommended admission for surgery.  Plan to admit patient to hospitalist service for management prior to surgery.  Disposition:  I discussed the case with hospitalist who graciously agreed to admit the patient to their service for continued care.   Clinical Impression:  1. Closed fracture of left hip, initial encounter [facility])     Rx / DC Orders ED Discharge Orders     None        The plan for this patient was discussed with Dr. Elayne Snare, who voiced agreement and who oversaw evaluation and treatment of this patient.   Clinical Complexity A medically appropriate history, review of systems, and physical exam was performed.  Patient's presentation is most consistent with acute presentation with potential threat to life or bodily function.  Medical Decision Making Amount and/or Complexity of Data Reviewed Labs: ordered. Radiology: ordered.  Risk Prescription drug management. Decision regarding hospitalization.    Physical Exam and Medical History   Vitals:   05/12/23 1615 05/12/23 1625 05/12/23 1926 05/12/23 1928  BP: 130/68  128/62   Pulse: (!) 50  93   Resp: 20  16   Temp:  98.2 F (36.8 C)  97.9 F (36.6 C)  TempSrc:  Oral  Oral  SpO2: 100%  100%   Weight:      Height:        Physical Exam Vitals and nursing note reviewed.  Constitutional:      General: She is not in acute distress.    Appearance: She is well-developed.  HENT:     Head: Normocephalic and atraumatic.  Eyes:     Conjunctiva/sclera: Conjunctivae normal.  Cardiovascular:     Rate and Rhythm: Normal rate and regular rhythm.     Heart sounds: No murmur heard. Pulmonary:     Effort: Pulmonary effort is normal. No respiratory distress.     Breath sounds: Normal breath sounds.  Abdominal:  Palpations: Abdomen is soft.     Tenderness: There is no abdominal tenderness.  Musculoskeletal:     Cervical back: Neck supple.     Comments: Gross deformity and tenderness to palpation of the left hip. Left lower extremity is externally rotated and shortened  Skin:    General: Skin is warm and dry.     Capillary Refill: Capillary refill takes less than 2 seconds.  Neurological:     Mental Status: She is alert.  Psychiatric:        Mood and Affect: Mood normal.     Medical History: Allergies  Allergen Reactions   Noroxin [Norfloxacin] Nausea And Vomiting   Augmentin  [Amoxicillin-Pot Clavulanate] Swelling    Gums swelling, lips numb   Rosuvastatin Other (See Comments)    Pain in back of legs   Past Medical History:  Diagnosis Date   Arthritis    Atrial fibrillation, persistent (HCC)    Complication of anesthesia    Dysrhythmia    A. Fib   GERD (gastroesophageal reflux disease)    Related to Hiatal Hernia   Headache    Localized in bilateral eyes   Heart murmur    TAVR 2023   Hiatal hernia    Hypertension    Hypothyroidism    PONV (postoperative nausea and vomiting)    Pre-diabetes    S/P TAVR (transcatheter aortic valve replacement) 12/14/2021   s/p TAVR with a 23mm Edwards S3UR via the TF approach by Dr. Lynnette Caffey & Dr. Leafy Ro   Severe aortic stenosis    Squamous cell carcinoma of skin 01/16/2018   in situ-right knee post (txpbx)   Thyroid disease     Past Surgical History:  Procedure Laterality Date   ABDOMINAL AORTOGRAM N/A 09/22/2021   Procedure: ABDOMINAL AORTOGRAM;  Surgeon: Orbie Pyo, MD;  [facility]: [facility] INVASIVE [facility] LAB;  Service: Cardiovascular;  Laterality: N/A;   ESOPHAGOGASTRODUODENOSCOPY N/A 01/09/2023   Procedure: ESOPHAGOGASTRODUODENOSCOPY (EGD);  Surgeon: Corliss Skains, MD;  Location: [facility] OR;  Service: Thoracic;  Laterality: N/A;   EYE SURGERY     INTRAOPERATIVE TRANSTHORACIC ECHOCARDIOGRAM N/A 12/14/2021   Procedure: INTRAOPERATIVE TRANSTHORACIC ECHOCARDIOGRAM;  Surgeon: Orbie Pyo, MD;  Location: [facility] OR;  Service: Open Heart Surgery;  Laterality: N/A;   MULTIPLE EXTRACTIONS WITH ALVEOLOPLASTY N/A 12/02/2021   Procedure: MULTIPLE EXTRACTION WITH ALVEOLOPLASTY;  Surgeon: Sharman Cheek, DMD;  Location: [facility] OR;  Service: Dentistry;  Laterality: N/A;   RIGHT HEART CATH AND CORONARY/GRAFT ANGIOGRAPHY N/A 09/22/2021   Procedure: RIGHT HEART CATH AND CORONARY/GRAFT ANGIOGRAPHY;  Surgeon: Orbie Pyo, MD;  Location: [facility] LAB;  Service: Cardiovascular;  Laterality: N/A;   TOE SURGERY Right     Hammer Toes   TRANSCATHETER AORTIC VALVE REPLACEMENT, TRANSFEMORAL N/A 12/14/2021   Procedure: Transcatheter Aortic Valve Replacement, Transfemoral using Edwards 23 MM SAPIEN 3 Ultra;  Surgeon: Orbie Pyo, MD;  Location: [facility] OR;  Service: Open Heart Surgery;  Laterality: N/A;  Transfemoral approach   TUBAL LIGATION  1986   XI ROBOTIC ASSISTED HIATAL HERNIA REPAIR N/A 01/09/2023   Procedure: XI ROBOTIC ASSISTED HIATAL HERNIA REPAIR WITH FUNDOPLICATION;  Surgeon: Corliss Skains, MD;  Location: [facility] OR;  Service: Thoracic;  Laterality: N/A;   Family History  Problem Relation Age of Onset   Migraines Mother    Hypertension Mother    Hyperlipidemia Mother    Heart failure Mother        Age 7   Hyperlipidemia Father  Hypertension Father    Heart disease Father        died of MI after developoing cancer.   Lung cancer Father    Hypertension Sister    Breast cancer Paternal Aunt    Migraines Maternal Grandfather    Migraines Daughter    Breast cancer Daughter     Social History   Tobacco Use   Smoking status: Never    Passive exposure: Past   Smokeless tobacco: Never  Vaping Use   Vaping status: Never Used  Substance Use Topics   Alcohol use: No    Alcohol/week: 0.0 standard drinks of alcohol   Drug use: No    Procedures   If procedures were preformed on this patient, they are listed below:  Procedures   -------- HPI and MDM generated using voice dictation software and may contain dictation errors. Please contact me for any clarification or with any questions.   Cephus Slater, MD Emergency Medicine PGY-2    Caron Presume, MD 05/12/23 2040    Royanne Foots, DO 05/14/23 1208

## 2023-05-12 NOTE — H&P (View-Only) (Signed)
 Orthopaedic Trauma Service (OTS) Consult   Patient ID: Autumn Moyer MRN: [id number] DOB/AGE: May 09, 1940 83 y.o.   Reason for Consult: Left Femoral neck fracture  Referring Physician:  Freda Munro, MD  Cardiologist: Rollene Rotunda, MD  PCP: Jannifer Rodney, FNP   HPI: Autumn Moyer is an 82 y.o. female with history of A-fib on Eliquis, s/p TAVR, hiatal hernia repair, hypertension, and hypothyroidism who sustained a ground-level fall this afternoon.  Patient with his her sister when she felt dizzy and fell on her left hip while she was outside.  Patient had immediate onset of pain and inability bear weight.  She was brought to [facility] for evaluation was found to have a left femoral neck fracture.  Orthopedics consulted for evaluation regarding her hip fracture  Patient was seen in the emergency room room 18.  Her husband and daughter are present.  She is very pleasant reports pain isolated to her left hip but states that her left leg does feel a little cool at times it is intermittent.  Denies any additional injuries elsewhere.  She lives with her husband.  She does not use any assistive devices.  She drives and does housework including going up and down stairs, cooking and cleaning.  She drives and goes out into town every day.  Pt had some previous SOB that has improved following hiatal hernia repair.  Last office visit with [facility] noted some fluid in the hernia sac, pt to follow up with cardiothoracic surgery for a few months for further eval    Past Medical History:  Diagnosis Date   Arthritis    Atrial fibrillation, persistent (HCC)    Complication of anesthesia    Dysrhythmia    A. 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 Lynnette Caffey & Dr. Leafy Ro   Severe aortic stenosis    Squamous cell carcinoma of skin 01/16/2018   in situ-right knee post (txpbx)   Thyroid disease     Past Surgical History:  Procedure Laterality Date   ABDOMINAL AORTOGRAM N/A 09/22/2021   Procedure: ABDOMINAL AORTOGRAM;  Surgeon: Orbie Pyo, MD;  Location: [facility] INVASIVE CV LAB;  Service: Cardiovascular;  Laterality: N/A;   ESOPHAGOGASTRODUODENOSCOPY N/A 01/09/2023   Procedure: ESOPHAGOGASTRODUODENOSCOPY (EGD);  Surgeon: Corliss Skains, MD;  Location: [facility] OR;  Service: Thoracic;  Laterality: N/A;   EYE SURGERY     INTRAOPERATIVE TRANSTHORACIC ECHOCARDIOGRAM N/A 12/14/2021   Procedure: INTRAOPERATIVE TRANSTHORACIC ECHOCARDIOGRAM;  Surgeon: Orbie Pyo, MD;  Location: [facility] OR;  Service: Open Heart Surgery;  Laterality: N/A;   MULTIPLE EXTRACTIONS WITH ALVEOLOPLASTY N/A 12/02/2021   Procedure: MULTIPLE EXTRACTION WITH ALVEOLOPLASTY;  Surgeon: Sharman Cheek, DMD;  Location: [facility] OR;  Service: Dentistry;  Laterality: N/A;   RIGHT HEART CATH AND CORONARY/GRAFT ANGIOGRAPHY N/A 09/22/2021   Procedure: RIGHT HEART CATH AND CORONARY/GRAFT ANGIOGRAPHY;  Surgeon: Orbie Pyo, MD;  Location: [facility] INVASIVE CV LAB;  Service: Cardiovascular;  Laterality: N/A;   TOE SURGERY Right    Hammer Toes   TRANSCATHETER  AORTIC VALVE REPLACEMENT, TRANSFEMORAL N/A 12/14/2021   Procedure: Transcatheter Aortic Valve Replacement, Transfemoral using Edwards 23 MM SAPIEN 3 Ultra;  Surgeon: Orbie Pyo, MD;  Location: [facility] OR;  Service: Open Heart Surgery;  Laterality: N/A;  Transfemoral approach   TUBAL LIGATION  1986   XI ROBOTIC ASSISTED HIATAL HERNIA REPAIR N/A 01/09/2023   Procedure: XI ROBOTIC ASSISTED HIATAL HERNIA REPAIR WITH FUNDOPLICATION;  Surgeon: Corliss Skains, MD;  Location: [facility] OR;  Service: Thoracic;  Laterality: N/A;     Family History  Problem Relation Age of Onset   Migraines Mother    Hypertension Mother    Hyperlipidemia Mother    Heart failure Mother        Age 72   Hyperlipidemia Father    Hypertension Father    Heart disease Father        died of MI after developoing cancer.   Lung cancer Father    Hypertension Sister    Breast cancer Paternal Aunt    Migraines Maternal Grandfather    Migraines Daughter    Breast cancer Daughter     Social History:  reports that she has never smoked. She has been exposed to tobacco smoke. She has never used smokeless tobacco. She reports that she does not drink alcohol and does not use drugs.  Allergies:  Allergies  Allergen Reactions   Noroxin [Norfloxacin] Nausea And Vomiting   Augmentin [Amoxicillin-Pot Clavulanate] Swelling    Gums swelling, lips numb   Rosuvastatin Other (See Comments)    Pain in back of legs    Medications: I have reviewed the patient's current medications. Current Outpatient Medications  Medication Instructions   acetaminophen (TYLENOL) 650 mg, Oral, Every 6 hours PRN   apixaban (ELIQUIS) 5 mg, Oral, 2 times daily   atorvastatin (LIPITOR) 20 mg, Oral, Daily   azithromycin (ZITHROMAX) 500 mg,  Once   carboxymethylcellulose (REFRESH TEARS) 0.5 % SOLN 2 drops, 3 times daily PRN   digoxin (LANOXIN) 125 mcg, Oral, Daily   furosemide (LASIX) 20 mg, Oral, Daily PRN   levocetirizine (XYZAL) 5 mg, Oral, Every evening   levothyroxine (SYNTHROID) 100 mcg, Oral, Daily   metoprolol tartrate (LOPRESSOR) 50 mg, Oral, 2 times daily   mometasone (ELOCON) 0.1 % cream 1 Application, Topical, Daily   Multiple Vitamin (MULTIVITAMIN WITH MINERALS) TABS tablet 1 tablet, Every evening   tacrolimus (PROTOPIC) 0.1 % ointment Topical, 2 times daily     Results for orders placed or performed during the hospital encounter of 05/12/23 (from the past 48 hours)  CBG monitoring, ED     Status: Abnormal   Collection Time: 05/12/23  3:58 PM   Result Value Ref Range   Glucose-Capillary 109 (H) 70 - 99 mg/dL    Comment: Glucose reference range applies only to samples taken after fasting for at least 8 hours.  Basic metabolic panel     Status: Abnormal   Collection Time: 05/12/23  4:13 PM  Result Value Ref Range   Sodium 139 135 - 145 mmol/L   Potassium 3.4 (L) 3.5 - 5.1 mmol/L   Chloride 100 98 - 111 mmol/L   CO2 25 22 - 32 mmol/L   Glucose, Bld 127 (H) 70 - 99 mg/dL    Comment: Glucose reference range applies only to samples taken after fasting for at least 8 hours.   BUN 20 8 - 23 mg/dL   Creatinine, Ser 1.61 (H) 0.44 - 1.00 mg/dL   Calcium 9.7 8.9 - 09.6 mg/dL  GFR, Estimated 45 (L) >60 mL/min    Comment: (NOTE) Calculated using the CKD-EPI Creatinine Equation (2021)    Anion gap 14 5 - 15    Comment: Performed at [facility], [address], [place], [place] [address]  CBC     Status: None   Collection Time: 05/12/23  4:13 PM  Result Value Ref Range   WBC 9.8 4.0 - 10.5 K/uL   RBC 4.48 3.87 - 5.11 MIL/uL   Hemoglobin 12.9 12.0 - 15.0 g/dL   HCT 29.5 62.1 - 30.8 %   MCV 86.4 80.0 - 100.0 fL   MCH 28.8 26.0 - 34.0 pg   MCHC 33.3 30.0 - 36.0 g/dL   RDW 65.7 84.6 - 96.2 %   Platelets 225 150 - 400 K/uL   nRBC 0.0 0.0 - 0.2 %    Comment: Performed at [facility], [address], [place], [place] [address]    DG Knee Left Port Result Date: 05/12/2023 CLINICAL DATA:  Fall, left knee and leg pain EXAM: PORTABLE LEFT KNEE - 1-2 VIEW COMPARISON:  None Available. FINDINGS: No evidence of fracture, dislocation, or joint effusion. No evidence of arthropathy or other focal bone abnormality. Soft tissues are unremarkable. IMPRESSION: Negative. Electronically Signed   By: Charlett Nose M.D.   On: 05/12/2023 20:24   CT Head Wo Contrast Result Date: 05/12/2023 CLINICAL DATA:  Acute onset of dizziness after getting out of a car. The patient fell with left-sided injuries. EXAM: CT HEAD WITHOUT CONTRAST TECHNIQUE:  Contiguous axial images were obtained from the base of the skull through the vertex without intravenous contrast. RADIATION DOSE REDUCTION: This exam was performed according to the departmental dose-optimization program which includes automated exposure control, adjustment of the mA and/or kV according to patient size and/or use of iterative reconstruction technique. COMPARISON:  MR head without contrast 04/21/2022. FINDINGS: Brain: No acute infarct, hemorrhage, or mass lesion is present. No significant white matter lesions are present. Deep brain nuclei are within normal limits. The ventricles are of normal size. No significant extraaxial fluid collection is present. The brainstem and cerebellum are within normal limits. Midline structures are within normal limits. Vascular: No hyperdense vessel or unexpected calcification. Skull: Calvarium is intact. No focal lytic or blastic lesions are present. No significant extracranial soft tissue lesion is present. Sinuses/Orbits: The paranasal sinuses and mastoid air cells are clear. Bilateral lens replacements are noted. Globes and orbits are otherwise unremarkable. IMPRESSION: Negative CT of the head. Electronically Signed   By: Marin Roberts M.D.   On: 05/12/2023 18:56   CT Cervical Spine Wo Contrast Result Date: 05/12/2023 CLINICAL DATA:  Fall. Patient is on Eliquis. Patient became dizzy after getting out of a car. EXAM: CT CERVICAL SPINE WITHOUT CONTRAST TECHNIQUE: Multidetector CT imaging of the cervical spine was performed without intravenous contrast. Multiplanar CT image reconstructions were also generated. RADIATION DOSE REDUCTION: This exam was performed according to the departmental dose-optimization program which includes automated exposure control, adjustment of the mA and/or kV according to patient size and/or use of iterative reconstruction technique. COMPARISON:  None Available. FINDINGS: Alignment: No significant listhesis is present. Normal  cervical lordosis is present. Skull base and vertebrae: Craniocervical junction is within normal limits. The vertebral body heights are normal. No acute fractures are present. Soft tissues and spinal canal: No prevertebral fluid or swelling. No visible canal hematoma. Disc levels: Significant focal disc protrusion or stenosis is present. Uncovertebral spurring contributes to mild foraminal narrowing, left  greater than right at C5-6. Upper chest: The lung apices are clear. The thoracic inlet is within normal limits. IMPRESSION: 1. No acute fracture or traumatic subluxation. 2. Mild foraminal narrowing, left greater than right at C5-6. Electronically Signed   By: Marin Roberts M.D.   On: 05/12/2023 18:53   DG Hip Unilat With Pelvis 2-3 Views Left Result Date: 05/12/2023 CLINICAL DATA:  Left hip pain after fall. EXAM: DG HIP (WITH OR WITHOUT PELVIS) 2-3V LEFT COMPARISON:  None Available. FINDINGS: Moderately displaced proximal left femoral neck fracture is noted. IMPRESSION: Moderately displaced proximal left femoral neck fracture. Electronically Signed   By: Lupita Raider M.D.   On: 05/12/2023 16:21    Intake/Output    None      Review of Systems  Constitutional:  Negative for chills and fever.  Eyes:  Negative for double vision.  Cardiovascular:  Negative for chest pain.  Gastrointestinal:  Negative for abdominal pain and nausea.  Musculoskeletal:  Positive for joint pain (Left hip pain).   Blood pressure 128/62, pulse 93, temperature 97.9 F (36.6 C), temperature source Oral, resp. rate 16, height 5\' 4"  (1.626 m), weight 62.1 kg, SpO2 100%. Physical Exam Vitals and nursing note reviewed.  Constitutional:      General: She is awake. She is not in acute distress.    Appearance: Normal appearance. She is normal weight.     Comments: Very pleasant 83 y/o female, NAD  Eyes:     Extraocular Movements: Extraocular movements intact.  Cardiovascular:     Comments: Symmetric DP pulses  noted  Pulmonary:     Comments: No increased work of breathing  Musculoskeletal:     Comments:  Pelvis/Left Lower Extremity  Pelvis is stable with lateral compression, no pain with bony exam  No significant shortening or rotation of left leg   Soft tissue envelop is in good condition  No appreciable knee effusion clinically EHL, FHL, lesser toe motor intact Ankle flexion, extension, inversion and eversion intact No tenderness to foot, ankle, lower leg or knee + DP pulse, symmetric to contra-lateral leg  Did not manipulate L hip due to known fracture  Skin with changes consistent with PVD distally     Neurological:     Mental Status: She is alert.  Psychiatric:        Behavior: Behavior is cooperative.            Assessment/Plan:  83 year-old female ground-level fall with left femoral neck fracture  -ground level fall  - displaced left femoral neck fracture in independent community Ambulator Discussed various treatment options.  Based off family's description I think patient would benefit from a total hip arthroplasty as opposed to hemiarthroplasty We will discuss with one of our total joint colleagues  Pt is currently relatively comfortable, no need for bucks traction, can   Patient will need cardiac clearance preoperatively.  Best case scenario is that we could proceed with surgery tomorrow afternoon if total joint surgeon available  Patient can continue bedrest for now and will start therapies postoperatively  She desires to return home after the hospital and has adequate help from her daughters and grandchildren and some limited help from her husband.  - Pain management:  Multimodal  Minimize narcotics - ABL anemia/Hemodynamics  Monitor - Medical issues   Her primary - DVT/PE prophylaxis:  Hold anticoagulation - ID:   Perioperative antibiotics - Metabolic Bone Disease:  This is a fragility fracture and indicative of osteoporosis  Most recent  DEXA scan I  see in the epic system is from 2007 and I am unable to access those results  - Activity:  Bedrest for now  Therapies to start postoperatively  - FEN/GI prophylaxis/Foley/Lines:  Okay to eat  N.p.o. after midnight  - Dispo:  Patient will need surgical intervention for her left femoral neck fracture  We are hopeful that cardiology will see her in the morning and can proceed with surgery in the afternoon.   Mearl Latin, PA-C [phone] (C) 05/12/2023, 8:59 PM  [facility] [address] [place] [address] [phone] [phone] (F)    After 5pm and on the weekends please log on to Amion, go to orthopaedics and the look under the [facility] Call for the provider(s) on call. You can also call our office at [phone] and then follow the prompts to be connected to the call team.

## 2023-05-13 ENCOUNTER — Inpatient Hospital Stay (HOSPITAL_COMMUNITY): Payer: HMO | Admitting: Anesthesiology

## 2023-05-13 ENCOUNTER — Encounter (HOSPITAL_COMMUNITY): Payer: Self-pay | Admitting: Internal Medicine

## 2023-05-13 ENCOUNTER — Encounter (HOSPITAL_COMMUNITY)
Admission: EM | Disposition: A | Discharge status: Home Health | Payer: Self-pay | Source: Home / Self Care | Attending: Internal Medicine

## 2023-05-13 ENCOUNTER — Other Ambulatory Visit: Payer: Self-pay

## 2023-05-13 ENCOUNTER — Inpatient Hospital Stay (HOSPITAL_COMMUNITY): Payer: HMO

## 2023-05-13 DIAGNOSIS — I4891 Unspecified atrial fibrillation: Secondary | ICD-10-CM | POA: Diagnosis not present

## 2023-05-13 DIAGNOSIS — I10 Essential (primary) hypertension: Secondary | ICD-10-CM | POA: Diagnosis not present

## 2023-05-13 DIAGNOSIS — S72002A Fracture of unspecified part of neck of left femur, initial encounter for closed fracture: Secondary | ICD-10-CM | POA: Diagnosis not present

## 2023-05-13 DIAGNOSIS — Z96642 Presence of left artificial hip joint: Secondary | ICD-10-CM

## 2023-05-13 DIAGNOSIS — I482 Chronic atrial fibrillation, unspecified: Secondary | ICD-10-CM | POA: Diagnosis not present

## 2023-05-13 DIAGNOSIS — Z01810 Encounter for preprocedural cardiovascular examination: Secondary | ICD-10-CM | POA: Diagnosis not present

## 2023-05-13 HISTORY — PX: TOTAL HIP ARTHROPLASTY: SHX124

## 2023-05-13 LAB — CBC
HCT: 35.1 % — ABNORMAL LOW (ref 36.0–46.0)
Hemoglobin: 12.1 g/dL (ref 12.0–15.0)
MCH: 29.5 pg (ref 26.0–34.0)
MCHC: 34.5 g/dL (ref 30.0–36.0)
MCV: 85.6 fL (ref 80.0–100.0)
Platelets: 200 10*3/uL (ref 150–400)
RBC: 4.1 MIL/uL (ref 3.87–5.11)
RDW: 13.4 % (ref 11.5–15.5)
WBC: 12.9 10*3/uL — ABNORMAL HIGH (ref 4.0–10.5)
nRBC: 0 % (ref 0.0–0.2)

## 2023-05-13 LAB — TYPE AND SCREEN
ABO/RH(D): A NEG
Antibody Screen: NEGATIVE

## 2023-05-13 LAB — SURGICAL PCR SCREEN
MRSA, PCR: POSITIVE — AB
Staphylococcus aureus: POSITIVE — AB

## 2023-05-13 SURGERY — ARTHROPLASTY, HIP, TOTAL, ANTERIOR APPROACH
Anesthesia: General | Site: Hip | Laterality: Left

## 2023-05-13 MED ORDER — DIGOXIN 125 MCG PO TABS
125.0000 ug | ORAL_TABLET | Freq: Every day | ORAL | Status: DC
  Administered 2023-05-14 – 2023-05-15 (×2): 125 ug via ORAL
  Filled 2023-05-13 (×2): qty 1

## 2023-05-13 MED ORDER — ACETAMINOPHEN 10 MG/ML IV SOLN
INTRAVENOUS | Status: AC
  Filled 2023-05-13: qty 100

## 2023-05-13 MED ORDER — LACTATED RINGERS IV SOLN: INTRAVENOUS | Status: DC

## 2023-05-13 MED ORDER — ONDANSETRON HCL 4 MG PO TABS: 4.0000 mg | ORAL_TABLET | Freq: Four times a day (QID) | ORAL | Status: DC | PRN

## 2023-05-13 MED ORDER — SODIUM CHLORIDE (PF) 0.9 % IJ SOLN
INTRAMUSCULAR | Status: AC
  Filled 2023-05-13: qty 30

## 2023-05-13 MED ORDER — SUGAMMADEX SODIUM 200 MG/2ML IV SOLN
INTRAVENOUS | Status: AC
  Filled 2023-05-13: qty 2

## 2023-05-13 MED ORDER — PHENYLEPHRINE HCL-NACL 20-0.9 MG/250ML-% IV SOLN
INTRAVENOUS | Status: DC | PRN
  Administered 2023-05-13: 25 ug/min via INTRAVENOUS

## 2023-05-13 MED ORDER — CHLORHEXIDINE GLUCONATE 4 % EX SOLN: 60.0000 mL | Freq: Once | CUTANEOUS | Status: DC

## 2023-05-13 MED ORDER — LEVOTHYROXINE SODIUM 100 MCG PO TABS
100.0000 ug | ORAL_TABLET | Freq: Every day | ORAL | Status: DC
  Administered 2023-05-13 – 2023-05-15 (×3): 100 ug via ORAL
  Filled 2023-05-13 (×3): qty 1

## 2023-05-13 MED ORDER — HYDROMORPHONE HCL 1 MG/ML IJ SOLN: 0.5000 mg | INTRAMUSCULAR | Status: DC | PRN

## 2023-05-13 MED ORDER — POVIDONE-IODINE 10 % EX SWAB
2.0000 | Freq: Once | CUTANEOUS | Status: AC
  Administered 2023-05-13: 2 via TOPICAL

## 2023-05-13 MED ORDER — CHLORHEXIDINE GLUCONATE 0.12 % MT SOLN: 15.0000 mL | Freq: Once | OROMUCOSAL | Status: DC

## 2023-05-13 MED ORDER — CEFAZOLIN SODIUM-DEXTROSE 2-4 GM/100ML-% IV SOLN
2.0000 g | Freq: Four times a day (QID) | INTRAVENOUS | Status: AC
  Administered 2023-05-13 – 2023-05-14 (×2): 2 g via INTRAVENOUS
  Filled 2023-05-13 (×2): qty 100

## 2023-05-13 MED ORDER — HYDROMORPHONE HCL 1 MG/ML IJ SOLN: 0.2500 mg | INTRAMUSCULAR | Status: DC | PRN

## 2023-05-13 MED ORDER — DEXAMETHASONE SODIUM PHOSPHATE 10 MG/ML IJ SOLN
INTRAMUSCULAR | Status: DC | PRN
  Administered 2023-05-13: 5 mg via INTRAVENOUS

## 2023-05-13 MED ORDER — SODIUM CHLORIDE 0.9% FLUSH
3.0000 mL | Freq: Two times a day (BID) | INTRAVENOUS | Status: DC
  Administered 2023-05-13: 3 mL via INTRAVENOUS
  Administered 2023-05-14 – 2023-05-15 (×2): 10 mL via INTRAVENOUS

## 2023-05-13 MED ORDER — ACETAMINOPHEN 500 MG PO TABS: 1000.0000 mg | ORAL_TABLET | Freq: Once | ORAL | Status: DC

## 2023-05-13 MED ORDER — FENTANYL CITRATE (PF) 100 MCG/2ML IJ SOLN
INTRAMUSCULAR | Status: AC
  Administered 2023-05-13: 50 ug via INTRAVENOUS
  Filled 2023-05-13: qty 2

## 2023-05-13 MED ORDER — PROPOFOL 10 MG/ML IV BOLUS
INTRAVENOUS | Status: AC
  Filled 2023-05-13: qty 20

## 2023-05-13 MED ORDER — TRANEXAMIC ACID-NACL 1000-0.7 MG/100ML-% IV SOLN
INTRAVENOUS | Status: AC
  Filled 2023-05-13: qty 100

## 2023-05-13 MED ORDER — PHENYLEPHRINE 80 MCG/ML (10ML) SYRINGE FOR IV PUSH (FOR BLOOD PRESSURE SUPPORT)
PREFILLED_SYRINGE | INTRAVENOUS | Status: AC
  Filled 2023-05-13: qty 10

## 2023-05-13 MED ORDER — SODIUM CHLORIDE (PF) 0.9 % IJ SOLN
INTRAMUSCULAR | Status: DC | PRN
  Administered 2023-05-13: 61 mL

## 2023-05-13 MED ORDER — VANCOMYCIN HCL IN DEXTROSE 1-5 GM/200ML-% IV SOLN
INTRAVENOUS | Status: AC
Start: 2023-05-13 — End: 2023-05-13
  Administered 2023-05-13: 1000 mg via INTRAVENOUS
  Filled 2023-05-13: qty 200

## 2023-05-13 MED ORDER — METOPROLOL TARTRATE 50 MG PO TABS
50.0000 mg | ORAL_TABLET | Freq: Two times a day (BID) | ORAL | Status: DC
  Administered 2023-05-13 – 2023-05-15 (×3): 50 mg via ORAL
  Filled 2023-05-13 (×3): qty 1

## 2023-05-13 MED ORDER — POLYETHYLENE GLYCOL 3350 17 G PO PACK
17.0000 g | PACK | Freq: Two times a day (BID) | ORAL | Status: DC
  Filled 2023-05-13 (×4): qty 1

## 2023-05-13 MED ORDER — ALUM & MAG HYDROXIDE-SIMETH 200-200-20 MG/5ML PO SUSP: 30.0000 mL | ORAL | Status: DC | PRN

## 2023-05-13 MED ORDER — OXYCODONE HCL 5 MG PO TABS
2.5000 mg | ORAL_TABLET | ORAL | Status: DC | PRN
  Administered 2023-05-15: 5 mg via ORAL
  Filled 2023-05-13: qty 1

## 2023-05-13 MED ORDER — FENTANYL CITRATE (PF) 250 MCG/5ML IJ SOLN
INTRAMUSCULAR | Status: DC | PRN
Start: 2023-05-13 — End: 2023-05-13
  Administered 2023-05-13 (×3): 50 ug via INTRAVENOUS

## 2023-05-13 MED ORDER — VANCOMYCIN HCL IN DEXTROSE 1-5 GM/200ML-% IV SOLN: 1000.0000 mg | INTRAVENOUS | Status: AC

## 2023-05-13 MED ORDER — ACETAMINOPHEN 500 MG PO TABS
1000.0000 mg | ORAL_TABLET | Freq: Four times a day (QID) | ORAL | Status: DC
  Administered 2023-05-13 – 2023-05-15 (×6): 1000 mg via ORAL
  Filled 2023-05-13 (×7): qty 2

## 2023-05-13 MED ORDER — METOCLOPRAMIDE HCL 5 MG PO TABS: 5.0000 mg | ORAL_TABLET | Freq: Three times a day (TID) | ORAL | Status: DC | PRN

## 2023-05-13 MED ORDER — METHOCARBAMOL 500 MG PO TABS: 500.0000 mg | ORAL_TABLET | Freq: Four times a day (QID) | ORAL | Status: DC | PRN

## 2023-05-13 MED ORDER — SUCCINYLCHOLINE CHLORIDE 200 MG/10ML IV SOSY
PREFILLED_SYRINGE | INTRAVENOUS | Status: DC | PRN
  Administered 2023-05-13: 80 mg via INTRAVENOUS

## 2023-05-13 MED ORDER — MUPIROCIN 2 % EX OINT: 1.0000 | TOPICAL_OINTMENT | Freq: Two times a day (BID) | CUTANEOUS | Status: DC

## 2023-05-13 MED ORDER — LIDOCAINE 2% (20 MG/ML) 5 ML SYRINGE
INTRAMUSCULAR | Status: AC
  Filled 2023-05-13: qty 5

## 2023-05-13 MED ORDER — OXYCODONE HCL 5 MG PO TABS: 5.0000 mg | ORAL_TABLET | ORAL | Status: DC | PRN

## 2023-05-13 MED ORDER — ONDANSETRON HCL 4 MG/2ML IJ SOLN: 4.0000 mg | Freq: Four times a day (QID) | INTRAMUSCULAR | Status: DC | PRN

## 2023-05-13 MED ORDER — FENTANYL CITRATE (PF) 250 MCG/5ML IJ SOLN
INTRAMUSCULAR | Status: AC
  Filled 2023-05-13: qty 5

## 2023-05-13 MED ORDER — SENNA 8.6 MG PO TABS
2.0000 | ORAL_TABLET | Freq: Every day | ORAL | Status: DC
  Administered 2023-05-13 – 2023-05-14 (×2): 17.2 mg via ORAL
  Filled 2023-05-13 (×2): qty 2

## 2023-05-13 MED ORDER — ONDANSETRON HCL 4 MG/2ML IJ SOLN
INTRAMUSCULAR | Status: DC | PRN
  Administered 2023-05-13: 4 mg via INTRAVENOUS

## 2023-05-13 MED ORDER — APIXABAN 5 MG PO TABS
5.0000 mg | ORAL_TABLET | Freq: Two times a day (BID) | ORAL | Status: DC
  Administered 2023-05-14 – 2023-05-15 (×3): 5 mg via ORAL
  Filled 2023-05-13 (×3): qty 1

## 2023-05-13 MED ORDER — TACROLIMUS 0.1 % EX OINT: 1.0000 | TOPICAL_OINTMENT | Freq: Every morning | CUTANEOUS | Status: DC

## 2023-05-13 MED ORDER — PHENYLEPHRINE 80 MCG/ML (10ML) SYRINGE FOR IV PUSH (FOR BLOOD PRESSURE SUPPORT)
PREFILLED_SYRINGE | INTRAVENOUS | Status: DC | PRN
  Administered 2023-05-13: 160 ug via INTRAVENOUS
  Administered 2023-05-13 (×2): 80 ug via INTRAVENOUS

## 2023-05-13 MED ORDER — CHLORHEXIDINE GLUCONATE CLOTH 2 % EX PADS
6.0000 | MEDICATED_PAD | Freq: Every day | CUTANEOUS | Status: DC
  Administered 2023-05-13: 6 via TOPICAL

## 2023-05-13 MED ORDER — METHOCARBAMOL 1000 MG/10ML IJ SOLN: 500.0000 mg | Freq: Four times a day (QID) | INTRAMUSCULAR | Status: DC | PRN

## 2023-05-13 MED ORDER — DROPERIDOL 2.5 MG/ML IJ SOLN: 0.6250 mg | Freq: Once | INTRAMUSCULAR | Status: DC | PRN

## 2023-05-13 MED ORDER — ACETAMINOPHEN 500 MG PO TABS
ORAL_TABLET | ORAL | Status: AC
  Filled 2023-05-13: qty 2

## 2023-05-13 MED ORDER — ORAL CARE MOUTH RINSE: 15.0000 mL | Freq: Once | OROMUCOSAL | Status: DC

## 2023-05-13 MED ORDER — ACETAMINOPHEN 10 MG/ML IV SOLN
INTRAVENOUS | Status: DC | PRN
  Administered 2023-05-13: 1000 mg via INTRAVENOUS

## 2023-05-13 MED ORDER — PROPOFOL 10 MG/ML IV BOLUS
INTRAVENOUS | Status: DC | PRN
  Administered 2023-05-13: 90 mg via INTRAVENOUS

## 2023-05-13 MED ORDER — ACETAMINOPHEN 650 MG RE SUPP: 650.0000 mg | Freq: Four times a day (QID) | RECTAL | Status: DC | PRN

## 2023-05-13 MED ORDER — SUGAMMADEX SODIUM 200 MG/2ML IV SOLN
INTRAVENOUS | Status: DC | PRN
Start: 2023-05-13 — End: 2023-05-13
  Administered 2023-05-13: 150 mg via INTRAVENOUS

## 2023-05-13 MED ORDER — TRANEXAMIC ACID-NACL 1000-0.7 MG/100ML-% IV SOLN
1000.0000 mg | INTRAVENOUS | Status: AC
  Administered 2023-05-13: 1000 mg via INTRAVENOUS

## 2023-05-13 MED ORDER — SUCCINYLCHOLINE CHLORIDE 200 MG/10ML IV SOSY
PREFILLED_SYRINGE | INTRAVENOUS | Status: AC
  Filled 2023-05-13: qty 10

## 2023-05-13 MED ORDER — BISACODYL 10 MG RE SUPP: 10.0000 mg | Freq: Every day | RECTAL | Status: DC | PRN

## 2023-05-13 MED ORDER — SODIUM CHLORIDE 0.9% FLUSH: 3.0000 mL | INTRAVENOUS | Status: DC | PRN

## 2023-05-13 MED ORDER — METOCLOPRAMIDE HCL 5 MG/ML IJ SOLN: 5.0000 mg | Freq: Three times a day (TID) | INTRAMUSCULAR | Status: DC | PRN

## 2023-05-13 MED ORDER — CEFAZOLIN SODIUM-DEXTROSE 2-4 GM/100ML-% IV SOLN: 2.0000 g | INTRAVENOUS | Status: DC

## 2023-05-13 MED ORDER — FENTANYL CITRATE (PF) 100 MCG/2ML IJ SOLN: 50.0000 ug | Freq: Once | INTRAMUSCULAR | Status: AC

## 2023-05-13 MED ORDER — LIDOCAINE 2% (20 MG/ML) 5 ML SYRINGE
INTRAMUSCULAR | Status: DC | PRN
  Administered 2023-05-13: 60 mg via INTRAVENOUS

## 2023-05-13 MED ORDER — ACETAMINOPHEN 325 MG PO TABS: 650.0000 mg | ORAL_TABLET | Freq: Four times a day (QID) | ORAL | Status: DC | PRN

## 2023-05-13 MED ORDER — CHLORHEXIDINE GLUCONATE 4 % EX SOLN: 1.0000 | CUTANEOUS | 1 refills | Status: AC

## 2023-05-13 MED ORDER — DIPHENHYDRAMINE HCL 12.5 MG/5ML PO ELIX: 12.5000 mg | ORAL_SOLUTION | ORAL | Status: DC | PRN

## 2023-05-13 MED ORDER — STERILE WATER FOR IRRIGATION IR SOLN
Status: DC | PRN
  Administered 2023-05-13: 1000 mL

## 2023-05-13 MED ORDER — ONDANSETRON HCL 4 MG/2ML IJ SOLN
4.0000 mg | Freq: Four times a day (QID) | INTRAMUSCULAR | Status: DC
  Administered 2023-05-13 (×2): 4 mg via INTRAVENOUS
  Filled 2023-05-13 (×2): qty 2

## 2023-05-13 MED ORDER — ONDANSETRON HCL 4 MG/2ML IJ SOLN
INTRAMUSCULAR | Status: AC
  Filled 2023-05-13: qty 2

## 2023-05-13 MED ORDER — BUPIVACAINE-EPINEPHRINE (PF) 0.25% -1:200000 IJ SOLN
INTRAMUSCULAR | Status: AC
  Filled 2023-05-13: qty 30

## 2023-05-13 MED ORDER — CEFAZOLIN SODIUM-DEXTROSE 2-3 GM-%(50ML) IV SOLR
INTRAVENOUS | Status: DC | PRN
  Administered 2023-05-13: 2 g via INTRAVENOUS

## 2023-05-13 MED ORDER — DEXAMETHASONE SODIUM PHOSPHATE 10 MG/ML IJ SOLN
10.0000 mg | Freq: Once | INTRAMUSCULAR | Status: AC
  Administered 2023-05-14: 10 mg via INTRAVENOUS
  Filled 2023-05-13: qty 1

## 2023-05-13 MED ORDER — 0.9 % SODIUM CHLORIDE (POUR BTL) OPTIME
TOPICAL | Status: DC | PRN
  Administered 2023-05-13: 1000 mL

## 2023-05-13 MED ORDER — ROCURONIUM BROMIDE 10 MG/ML (PF) SYRINGE
PREFILLED_SYRINGE | INTRAVENOUS | Status: AC
  Filled 2023-05-13: qty 10

## 2023-05-13 MED ORDER — TRANEXAMIC ACID-NACL 1000-0.7 MG/100ML-% IV SOLN
1000.0000 mg | Freq: Once | INTRAVENOUS | Status: AC
  Administered 2023-05-13: 1000 mg via INTRAVENOUS
  Filled 2023-05-13: qty 100

## 2023-05-13 MED ORDER — MUPIROCIN 2 % EX OINT: 1.0000 | TOPICAL_OINTMENT | Freq: Two times a day (BID) | CUTANEOUS | 0 refills | Status: AC

## 2023-05-13 MED ORDER — KETOROLAC TROMETHAMINE 30 MG/ML IJ SOLN
INTRAMUSCULAR | Status: AC
  Filled 2023-05-13: qty 1

## 2023-05-13 MED ORDER — ROCURONIUM BROMIDE 10 MG/ML (PF) SYRINGE
PREFILLED_SYRINGE | INTRAVENOUS | Status: DC | PRN
Start: 2023-05-13 — End: 2023-05-13
  Administered 2023-05-13: 50 mg via INTRAVENOUS

## 2023-05-13 SURGICAL SUPPLY — 65 items
BAG COUNTER SPONGE SURGICOUNT (BAG) ×1 IMPLANT
BENZOIN TINCTURE PRP APPL 2/3 (GAUZE/BANDAGES/DRESSINGS) ×1 IMPLANT
BLADE SAG 18X100X1.27 (BLADE) IMPLANT
BLADE SAW SGTL 73X25 THK (BLADE) ×1 IMPLANT
BRUSH FEMORAL CANAL (MISCELLANEOUS) IMPLANT
COVER BACK TABLE 24X17X13 BIG (DRAPES) IMPLANT
CUP ACETBLR 52 OD PINNACLE (Hips) IMPLANT
DERMABOND ADVANCED .7 DNX12 (GAUZE/BANDAGES/DRESSINGS) IMPLANT
DRAPE IMP U-DRAPE 54X76 (DRAPES) ×1 IMPLANT
DRAPE INCISE IOBAN 66X45 STRL (DRAPES) IMPLANT
DRAPE INCISE IOBAN 85X60 (DRAPES) ×1 IMPLANT
DRAPE STERI IOBAN 125X83 (DRAPES) IMPLANT
DRAPE SURG ORHT 6 SPLT 77X108 (DRAPES) ×2 IMPLANT
DRAPE U-SHAPE 47X51 STRL (DRAPES) ×1 IMPLANT
DRSG AQUACEL AG ADV 3.5X10 (GAUZE/BANDAGES/DRESSINGS) IMPLANT
DRSG MEPILEX POST OP 4X12 (GAUZE/BANDAGES/DRESSINGS) ×1 IMPLANT
DRSG MEPILEX POST OP 4X8 (GAUZE/BANDAGES/DRESSINGS) ×1 IMPLANT
DURAPREP 26ML APPLICATOR (WOUND CARE) ×1 IMPLANT
ELECT BLADE 4.0 EZ CLEAN MEGAD (MISCELLANEOUS) ×1 IMPLANT
ELECT REM PT RETURN 9FT ADLT (ELECTROSURGICAL) ×1 IMPLANT
ELECTRODE BLDE 4.0 EZ CLN MEGD (MISCELLANEOUS) ×1 IMPLANT
ELECTRODE REM PT RTRN 9FT ADLT (ELECTROSURGICAL) ×1 IMPLANT
EVACUATOR 1/8 PVC DRAIN (DRAIN) IMPLANT
FACESHIELD WRAPAROUND (MASK) ×2 IMPLANT
FACESHIELD WRAPAROUND OR TEAM (MASK) ×2 IMPLANT
GLOVE BIOGEL PI IND STRL 7.5 (GLOVE) ×1 IMPLANT
GLOVE ORTHO TXT STRL SZ7.5 (GLOVE) ×2 IMPLANT
GOWN STRL REIN 3XL XLG LVL4 (GOWN DISPOSABLE) ×1 IMPLANT
GOWN STRL REUS W/ TWL LRG LVL3 (GOWN DISPOSABLE) ×1 IMPLANT
HEAD M SROM 36MM PLUS 1.5 (Hips) IMPLANT
IMMOBILIZER KNEE 20 (SOFTGOODS) IMPLANT
IMMOBILIZER KNEE 20 THIGH 36 (SOFTGOODS) IMPLANT
IMMOBILIZER KNEE 22 UNIV (SOFTGOODS) IMPLANT
IMMOBILIZER KNEE 24 THIGH 36 (MISCELLANEOUS) IMPLANT
IMMOBILIZER KNEE 24 UNIV (MISCELLANEOUS) IMPLANT
KIT BASIN OR (CUSTOM PROCEDURE TRAY) ×1 IMPLANT
KIT TURNOVER KIT B (KITS) ×1 IMPLANT
LINER NEUTRAL 52X36MM PLUS 4 (Liner) IMPLANT
MANIFOLD NEPTUNE II (INSTRUMENTS) ×1 IMPLANT
NS IRRIG 1000ML POUR BTL (IV SOLUTION) ×1 IMPLANT
PACK TOTAL JOINT (CUSTOM PROCEDURE TRAY) ×1 IMPLANT
PACK UNIVERSAL I (CUSTOM PROCEDURE TRAY) ×1 IMPLANT
PAD ARMBOARD 7.5X6 YLW CONV (MISCELLANEOUS) ×2 IMPLANT
PRESSURIZER FEMORAL UNIV (MISCELLANEOUS) IMPLANT
SCREW 6.5MMX35MM (Screw) IMPLANT
SET HNDPC FAN SPRY TIP SCT (DISPOSABLE) IMPLANT
SPONGE T-LAP 18X18 ~~LOC~~+RFID (SPONGE) ×1 IMPLANT
SPONGE T-LAP 4X18 ~~LOC~~+RFID (SPONGE) ×1 IMPLANT
SROM M HEAD 36MM PLUS 1.5 (Hips) ×1 IMPLANT
STAPLER VISISTAT 35W (STAPLE) IMPLANT
STEM FEMORAL SZ5 HIGH ACTIS (Stem) IMPLANT
STRIP CLOSURE SKIN 1/2X4 (GAUZE/BANDAGES/DRESSINGS) ×2 IMPLANT
SUCTION TUBE FRAZIER 10FR DISP (SUCTIONS) ×1 IMPLANT
SURGIFLO W/THROMBIN 8M KIT (HEMOSTASIS) ×1 IMPLANT
SUT ETHIBOND NAB CT1 #1 30IN (SUTURE) ×1 IMPLANT
SUT MNCRL AB 4-0 PS2 18 (SUTURE) IMPLANT
SUT VIC AB 0 CT1 27XBRD ANBCTR (SUTURE) ×2 IMPLANT
SUT VIC AB 1 CT1 27XBRD ANBCTR (SUTURE) ×2 IMPLANT
SUT VIC AB 2-0 CT1 TAPERPNT 27 (SUTURE) ×1 IMPLANT
TOWEL GREEN STERILE (TOWEL DISPOSABLE) ×1 IMPLANT
TOWEL GREEN STERILE FF (TOWEL DISPOSABLE) ×1 IMPLANT
TOWER CARTRIDGE SMART MIX (DISPOSABLE) IMPLANT
TRAY CATH INTERMITTENT SS 16FR (CATHETERS) IMPLANT
TRAY FOLEY MTR SLVR 16FR STAT (SET/KITS/TRAYS/PACK) IMPLANT
WATER STERILE IRR 1000ML POUR (IV SOLUTION) ×3 IMPLANT

## 2023-05-13 NOTE — Interval H&P Note (Signed)
 History and Physical Interval Note:  05/13/2023 11:35 AM  Autumn Moyer  has presented today for surgery, with the diagnosis of left femoral neck fracture.  The various methods of treatment have been discussed with the patient and family. After consideration of risks, benefits and other options for treatment, the patient has consented to  Procedure(s): TOTAL HIP ARTHROPLASTY ANTERIOR APPROACH (Left) as a surgical intervention.  The patient's history has been reviewed, patient examined, no change in status, stable for surgery.  I have reviewed the patient's chart and labs.  Questions were answered to the patient's satisfaction.     Shelda Pal

## 2023-05-13 NOTE — Progress Notes (Signed)
  Progress Note   Patient: Autumn Moyer ZOX:[id number] DOB: [birth year] DOA: 05/12/2023     1 DOS: the patient was seen and examined on 05/13/2023   Brief hospital course: 83 y.o. female with medical history significant for atrial fibrillation on Eliquis, hypothyroidism, TAVR in 2023, and Hiatal hernia repair 2024 presented after falling in the yard today.  After her here appointment this afternoon she felt like she was being pulled to one side while walking.  She held on for minute and it seemed to get better.  She was able to drive without any problem.  She got out of the car to help her sister into the house initially feeling fine but then she says she felt like her head weighed 100 pounds and had pulled her over and she ended up on the ground.  She says she has felt this sensation before but not so intensely.  She has had vertigo in the past as well but that was a slightly different spinning sensation.  She did not lose consciousness but was not able to walk.  Evaluation in the emergency department revealed a left displaced neck fracture.  She has been seen by orthopedics and will require surgical repair.  The patient denies any chest pain or change in her baseline shortness of breath but says her head still feels not normal.    Assessment and Plan: L hip fracture -Orthopedic Surgery following and pt is now s/p surgery 2/22 -Continue with analgesia as needed -F/u on therapy recs  Afib  -HR controlled and stable -Continued on metoprolol -apixaban to resume on 2/23  Chronic anticoagulation -Apixaban to resume on 2/23 -recheck cbc in AM  Hypokalemia -Will recheck bmet in AM -Cont to correct lytes as needed  Hx TAVR -Seems stable at this time  HTN -BP stable -Cont metoprolol  Subjective: Pt seen post-op in PACU, mildly sedated  Physical Exam: Vitals:   05/13/23 1430 05/13/23 1445 05/13/23 1455 05/13/23 1521  BP: (!) 104/59 (!) 102/42 (!) 104/49 (P) 100/65  Pulse: 82 87 73 (P) 92   Resp: 20 15 16  (P) 16  Temp:   97.9 F (36.6 C) (P) 97.8 F (36.6 C)  TempSrc:    (P) Oral  SpO2: 94% 92% 99% (P) 95%  Weight:      Height:       General exam: Asleep, laying in bed, in nad Respiratory system: Normal respiratory effort, no wheezing Cardiovascular system: regular rate, s1, s2 Gastrointestinal system: Soft, nondistended, positive BS Central nervous system: CN2-12 grossly intact, strength intact Extremities: Perfused, no clubbing Skin: Normal skin turgor, no notable skin lesions seen Psychiatry: difficult to assess given mentation  Data Reviewed:  Labs reviewed: WBC 12.9, Hgb 12.1, Plts 200  Family Communication: Pt in room, family not at bedside  Disposition: Status is: Inpatient Remains inpatient appropriate because: severity of illness  Planned Discharge Destination:  Pending PT eval    Author: Rickey Barbara, MD 05/13/2023 4:19 PM  For on call review [email or web address].

## 2023-05-13 NOTE — Plan of Care (Signed)
  Problem: Pain Managment: Goal: General experience of comfort will improve and/or be controlled Outcome: Progressing

## 2023-05-13 NOTE — Discharge Instructions (Signed)

## 2023-05-13 NOTE — Plan of Care (Signed)

## 2023-05-13 NOTE — Anesthesia Preprocedure Evaluation (Addendum)
 Anesthesia Evaluation  Patient identified by MRN, date of birth, ID band Patient awake    Reviewed: Allergy & Precautions, NPO status , Patient's Chart, lab work & pertinent test results, reviewed documented beta blocker date and time   History of Anesthesia Complications (+) PONV and history of anesthetic complications  Airway Mallampati: II  TM Distance: >3 FB Neck ROM: Full    Dental no notable dental hx. (+) Dental Advisory Given, Teeth Intact   Pulmonary neg pulmonary ROS   Pulmonary exam normal breath sounds clear to auscultation       Cardiovascular hypertension, Pt. on home beta blockers + dysrhythmias Atrial Fibrillation + Valvular Problems/Murmurs (Mod-severe TR) AS and MR  Rhythm:Irregular Rate:Normal + Systolic murmurs Echo 11/2022  1. Left ventricular ejection fraction, by estimation, is 60 to 65%. The left ventricle has normal function. The left ventricle has no regional wall motion abnormalities. Left ventricular diastolic parameters are indeterminate.   2. Right ventricular systolic function is normal. The right ventricular size is normal.   3. The mitral valve is normal in structure. Mild to moderate mitral valve regurgitation. No evidence of mitral stenosis.   4. Tricuspid valve regurgitation is moderate to severe.   5. Well-seated bioprosthetic valve. The aortic valve has been repaired/replaced. Aortic valve regurgitation is trivial. No aortic stenosis is present. There is a 23 mm Sapien prosthetic (TAVR) valve present in the aortic position. Aortic valve mean  gradient measures 15.2 mmHg. Aortic valve Vmax measures 2.43 m/s.   6. The inferior vena cava is normal in size with greater than 50% respiratory variability, suggesting right atrial pressure of 3 mmHg.     Neuro/Psych  Headaches  Neuromuscular disease    GI/Hepatic Neg liver ROS, hiatal hernia,GERD  ,,  Endo/Other  Hypothyroidism    Renal/GU negative  Renal ROS     Musculoskeletal  (+) Arthritis ,    Abdominal   Peds  Hematology  (+) Blood dyscrasia, anemia   Anesthesia Other Findings   Reproductive/Obstetrics                             Anesthesia Physical Anesthesia Plan  ASA: 3  Anesthesia Plan: General   Post-op Pain Management: Tylenol PO (pre-op)*   Induction: Intravenous  PONV Risk Score and Plan: 4 or greater and Ondansetron, Dexamethasone, Treatment may vary due to age or medical condition, Propofol infusion and TIVA  Airway Management Planned: Oral ETT  Additional Equipment:   Intra-op Plan:   Post-operative Plan: Extubation in OR  Informed Consent: I have reviewed the patients History and Physical, chart, labs and discussed the procedure including the risks, benefits and alternatives for the proposed anesthesia with the patient or authorized representative who has indicated his/her understanding and acceptance.     Dental advisory given  Plan Discussed with: CRNA  Anesthesia Plan Comments:         Anesthesia Quick Evaluation

## 2023-05-13 NOTE — Op Note (Signed)
 NAME:  Autumn Moyer                ACCOUNT NO.: [id number]      MEDICAL RECORD NO.: [id number]      FACILITY:  [facility]      PHYSICIAN:  Shelda Pal  DATE OF BIRTH:  19-Apr-1940     DATE OF PROCEDURE:  05/13/2023                                 OPERATIVE REPORT         PREOPERATIVE DIAGNOSIS: Left  hip femoral neck fracture      POSTOPERATIVE DIAGNOSIS:  Left hip femoral neck fracture     PROCEDURE:  Left total hip replacement through an anterior approach   utilizing DePuy THR system, component size 52 mm pinnacle cup, a size 36+4 neutral   Altrex liner, a size 5 Hi Actis stem with a 36+1.5 Articuleze metal head ball      SURGEON:  Madlyn Frankel. Charlann Boxer, M.D.      ASSISTANT:  Rosalene Billings, PA-C     ANESTHESIA:  General.      SPECIMENS:  None.      COMPLICATIONS:  None.      BLOOD LOSS:  300 cc     DRAINS:  None.      INDICATION OF THE PROCEDURE:  Autumn Moyer is a 83 y.o. female who presented to the [facility] emergency room after a ground-level fall.  She had immediate onset of left hip pain.  She was unable to mobilize and was transferred by EMS.  Radiographs of her left hip revealed a displaced left femoral neck fracture.  She is very active and wishes to remain so.  We thus discussed proceeding with total hip replacement as definitive treatment to address the acute femoral neck fracture as well as to prevent or minimize the risk for need for future surgeries.  Risks of infection DVT dislocation neurovascular injury reviewed.  Postoperative course and hospital stay reviewed.  Consent was obtained for the benefit of pain relief and management of her fracture.     PROCEDURE IN DETAIL:  The patient was brought to operative theater.   Once adequate anesthesia, preoperative antibiotics, 2 gm of Ancef, 1 gm of Tranexamic Acid, and 10 mg of Decadron were administered, the patient was positioned supine on the [employer] table.  Once the patient was safely positioned  with adequate padding of boney prominences we predraped out the hip, and used fluoroscopy to confirm orientation of the pelvis.      The left hip was then prepped and draped from proximal iliac crest to   mid thigh with a shower curtain technique.      Time-out was performed identifying the patient, planned procedure, and the appropriate extremity.     An incision was then made 2 cm lateral to the   anterior superior iliac spine extending over the orientation of the   tensor fascia lata muscle and sharp dissection was carried down to the   fascia of the muscle.      The fascia was then incised.  The muscle belly was identified and swept   laterally and retractor placed along the superior neck.  Following   cauterization of the circumflex vessels and removing some pericapsular   fat, a second cobra retractor was placed on the inferior neck.  A T-capsulotomy was  made along the line of the   superior neck to the trochanteric fossa, then extended proximally and   distally.  Tag sutures were placed and the retractors were then placed   intracapsular.  We then identified the trochanteric fossa and   orientation of my neck cut and then made a neck osteotomy with the femur on traction.  The fractured femoral neck segment and the femoral head were removed without difficulty or complication.  Traction was let   off and retractors were placed posterior and anterior around the   acetabulum.      The labrum and foveal tissue were debrided.  I began reaming with a 45 mm   reamer and reamed up to 51 mm reamer with good bony bed preparation and a 52 mm  cup was chosen.  The final 52 mm Pinnacle cup was then impacted under fluoroscopy to confirm the depth of penetration and orientation with respect to   Abduction and forward flexion.  A screw was placed into the ilium followed by the hole eliminator.  The final   36+4 neutral Altrex liner was impacted with good visualized rim fit.  The cup was positioned  anatomically within the acetabular portion of the pelvis.      At this point, the femur was rolled to 100 degrees.  Further capsule was   released off the inferior aspect of the femoral neck.  I then   released the superior capsule proximally.  With the leg in a neutral position the hook was placed laterally   along the femur under the vastus lateralis origin and elevated manually and then held in position using the hook attachment on the bed.  The leg was then extended and adducted with the leg rolled to 100   degrees of external rotation.  Retractors were placed along the medial calcar and posteriorly over the greater trochanter.  Once the proximal femur was fully   exposed, I used a box osteotome to set orientation.  I then began   broaching with the starting chili pepper broach and passed this by hand and then broached up to 5.  With the 5 broach in place I chose a high offset neck and did several trial reductions.  The offset was appropriate, leg lengths   appeared to be equal best matched with the +1.5 head ball trial confirmed radiographically.   Given these findings, I went ahead and dislocated the hip, repositioned all   retractors and positioned the right hip in the extended and abducted position.  The final 5 Hi Actis stem was   chosen and it was impacted down to the level of neck cut.  Based on this   and the trial reductions, a final 36+1.5 Articuleze metal ball was chosen and   impacted onto a clean and dry trunnion, and the hip was reduced.  The   hip had been irrigated throughout the case again at this point.  I did   reapproximate the superior capsular leaflet to the anterior leaflet   using #1 Vicryl.  The fascia of the   tensor fascia lata muscle was then reapproximated using #1 Vicryl and #0 Stratafix sutures.  The   remaining wound was closed with 2-0 Vicryl and running 4-0 Monocryl.   The hip was cleaned, dried, and dressed sterilely using Dermabond and   Aquacel dressing.   The patient was then brought   to recovery room in stable condition tolerating the procedure well.    Rosalene Billings,  PA-C was present for the entirety of the case involved from   preoperative positioning, perioperative retractor management, general   facilitation of the case, as well as primary wound closure as assistant.            Madlyn Frankel Charlann Boxer, M.D.        05/13/2023 11:41 AM

## 2023-05-13 NOTE — Anesthesia Postprocedure Evaluation (Signed)
 Anesthesia Post Note  Patient: Autumn Moyer  Procedure(s) Performed: TOTAL HIP ARTHROPLASTY ANTERIOR APPROACH (Left: Hip)     Patient location during evaluation: PACU Anesthesia Type: General Level of consciousness: sedated and patient cooperative Pain management: pain level controlled Vital Signs Assessment: post-procedure vital signs reviewed and stable Respiratory status: spontaneous breathing Cardiovascular status: stable Anesthetic complications: no   No notable events documented.  Last Vitals:  Vitals:   05/13/23 1455 05/13/23 1521  BP: (!) 104/49 (P) 100/65  Pulse: 73 (P) 92  Resp: 16 (P) 16  Temp: 36.6 C (P) 36.6 C  SpO2: 99% (P) 95%    Last Pain:  Vitals:   05/13/23 1521  TempSrc: (P) Oral  PainSc:                  Lewie Loron

## 2023-05-13 NOTE — Anesthesia Procedure Notes (Addendum)
 Procedure Name: Intubation Date/Time: 05/13/2023 12:57 PM  Performed by: Dairl Ponder, CRNAPre-anesthesia Checklist: Patient identified, Emergency Drugs available, Suction available and Patient being monitored Patient Re-evaluated:Patient Re-evaluated prior to induction Oxygen Delivery Method: Circle System Utilized Preoxygenation: Pre-oxygenation with 100% oxygen Induction Type: IV induction, Rapid sequence and Cricoid Pressure applied Laryngoscope Size: Mac and 3 Grade View: Grade I Tube type: Oral Tube size: 7.0 mm Number of attempts: 1 Airway Equipment and Method: Stylet and Oral airway Placement Confirmation: ETT inserted through vocal cords under direct vision, positive ETCO2 and breath sounds checked- equal and bilateral Secured at: 22 cm Tube secured with: Tape Dental Injury: Teeth and Oropharynx as per pre-operative assessment

## 2023-05-13 NOTE — Hospital Course (Signed)
 83 y.o. female with medical history significant for atrial fibrillation on Eliquis, hypothyroidism, TAVR in 2023, and Hiatal hernia repair 2024 presented after falling in the yard today.  After her here appointment this afternoon she felt like she was being pulled to one side while walking.  She held on for minute and it seemed to get better.  She was able to drive without any problem.  She got out of the car to help her sister into the house initially feeling fine but then she says she felt like her head weighed 100 pounds and had pulled her over and she ended up on the ground.  She says she has felt this sensation before but not so intensely.  She has had vertigo in the past as well but that was a slightly different spinning sensation.  She did not lose consciousness but was not able to walk.  Evaluation in the emergency department revealed a left displaced neck fracture.  She has been seen by orthopedics and will require surgical repair.  The patient denies any chest pain or change in her baseline shortness of breath but says her head still feels not normal.

## 2023-05-13 NOTE — Transfer of Care (Signed)
 Immediate Anesthesia Transfer of Care Note  Patient: Autumn Moyer  Procedure(s) Performed: TOTAL HIP ARTHROPLASTY ANTERIOR APPROACH (Left: Hip)  Patient Location: PACU  Anesthesia Type:General  Level of Consciousness: drowsy and patient cooperative  Airway & Oxygen Therapy: Patient Spontanous Breathing  Post-op Assessment: Report given to RN and Post -op Vital signs reviewed and stable  Post vital signs: Reviewed and stable  Last Vitals:  Vitals Value Taken Time  BP 104/59 05/13/23 1433  Temp 36.4 C 05/13/23 1425  Pulse 83 05/13/23 1434  Resp 20 05/13/23 1434  SpO2 95 % 05/13/23 1434  Vitals shown include unfiled device data.  Last Pain:  Vitals:   05/13/23 1425  TempSrc:   PainSc: Asleep      Patients Stated Pain Goal: 1 (05/13/23 0813)  Complications: No notable events documented.

## 2023-05-13 NOTE — Progress Notes (Signed)
 Orthopedic Tech Progress Note Patient Details:  Autumn Moyer 1941/02/25 [id number]  Patient ID: Autumn Moyer, female   DOB: November 04, 1940, 83 y.o.   MRN: [id number] Pt doesn't meet criteria for ohf. Pt must be under 70 to get ohf. Trinna Post 05/13/2023, 8:04 PM

## 2023-05-13 NOTE — Consult Note (Signed)
 Cardiology Consultation   Patient ID: Autumn Moyer MRN: [id number]; DOB: 08-Sep-1940  Admit date: 05/12/2023 Date of Consult: 05/13/2023  PCP:  Autumn Spencer, FNP   [facility] Providers Cardiologist:  Rollene Rotunda, MD       Patient Profile:   Autumn Moyer is a 83 y.o. female with a hx of hypertension, persistent atrial fibrillation on Eliquis, hypothyroidism, hypertriglyceridemia, severe aortic stenosis s/p TAVR 11/2021, hiatal hernia s/p repair 2024 who is being seen 05/13/2023 for the evaluation of pre-operative cardiac evaluation at the request of Dr. Rhona Leavens.  History of Present Illness:   [name] has a past medical history of hypertension, persistent atrial fibrillation on Eliquis, hypothyroidism, hypertriglyceridemia, severe aortic stenosis s/p TAVR 11/2021, hiatal hernia s/p repair 2024. She presented to the [facility] ED on 05/12/2023 after having a ground-level fall. She was found to have a moderately displaced proximal left femoral neck fracture. She was seen by ortho and scheduled for a total left hip arthoplasty on 05/13/2023. Cardiology was consulted to see patient prior to procedure to provide a pre-operative cardiac evaluation due to her cardiac history.   She is a patient of Dr. Antoine Poche and was last seen in office on 12/14/2022 by Cline Crock, PA-C for 1 year follow up post-TAVR. She underwent TAVR for severe aortic stenosis on 12/14/2021. Post-op echo showed LVEF of 60%, normally functioning TAVR, mean gradient of 9 mmHg, no PVL, mild MR, moderate pulmonary HTN. Echo from her appointment that day showed LVEF of 60%, normally functioning TAVR, mean gradient of 15.2 mmHg, no PVLs, mild to moderate MR, moderate to severe TR.   In regards to her most recent treatment plan, she was on Digoxin 0.125 mg daily, Lopressor 50 mg BID, Eliquis 5 mg BID, Lipitor 20 mg daily, benazepril-hydrochlorothiazide 20-25 mg daily. This treated her hypertension, persistent atrial  fibrillation and severe AS. At this time she was also on azithromycin for SBE prophylaxis due to PCN allergy.   After speaking with patient and her daughter in the room, they confirm that the patient is fairly active on a daily basis, going in and out of the house and conducting moderate exercise > 4 METs without any symptoms. She denies any chest pain, shortness of breath, syncope, palpitations. She has no history of diabetes, CVA, or previous MI. She is currently in significant amount of pain related to her hip fracture.  Past Medical History:  Diagnosis Date   Arthritis    Atrial fibrillation, persistent (HCC)    Complication of anesthesia    Dysrhythmia    A. Fib   GERD (gastroesophageal reflux disease)    Related to Hiatal Hernia   Headache    Localized in bilateral eyes   Heart murmur    TAVR 2023   Hiatal hernia    Hypertension    Hypothyroidism    PONV (postoperative nausea and vomiting)    Pre-diabetes    S/P TAVR (transcatheter aortic valve replacement) 12/14/2021   s/p TAVR with a 23mm Edwards S3UR via the TF approach by Dr. Lynnette Caffey & Dr. Leafy Ro   Severe aortic stenosis    Squamous cell carcinoma of skin 01/16/2018   in situ-right knee post (txpbx)   Thyroid disease    Past Surgical History:  Procedure Laterality Date   ABDOMINAL AORTOGRAM N/A 09/22/2021   Procedure: ABDOMINAL AORTOGRAM;  Surgeon: Orbie Pyo, MD;  Location: [facility] INVASIVE CV LAB;  Service: Cardiovascular;  Laterality: N/A;   ESOPHAGOGASTRODUODENOSCOPY N/A 01/09/2023  Procedure: ESOPHAGOGASTRODUODENOSCOPY (EGD);  Surgeon: Corliss Skains, MD;  Location: [facility] OR;  Service: Thoracic;  Laterality: N/A;   EYE SURGERY     INTRAOPERATIVE TRANSTHORACIC ECHOCARDIOGRAM N/A 12/14/2021   Procedure: INTRAOPERATIVE TRANSTHORACIC ECHOCARDIOGRAM;  Surgeon: Orbie Pyo, MD;  Location: [facility] OR;  Service: Open Heart Surgery;  Laterality: N/A;   MULTIPLE EXTRACTIONS WITH ALVEOLOPLASTY N/A 12/02/2021    Procedure: MULTIPLE EXTRACTION WITH ALVEOLOPLASTY;  Surgeon: Sharman Cheek, DMD;  Location: [facility] OR;  Service: Dentistry;  Laterality: N/A;   RIGHT HEART CATH AND CORONARY/GRAFT ANGIOGRAPHY N/A 09/22/2021   Procedure: RIGHT HEART CATH AND CORONARY/GRAFT ANGIOGRAPHY;  Surgeon: Orbie Pyo, MD;  Location: [facility]VAS[facility] CV LAB;  Service: Cardiovascular;  Laterality: N/A;   TOE SURGERY Right    Hammer Toes   TRANSCATHETER AORTIC VALVE REPLACEMENT, TRANSFEMORAL N/A 12/14/2021   Procedure: Transcatheter Aortic Valve Replacement, Transfemoral using Edwards 23 MM SAPIEN 3 Ultra;  Surgeon: Orbie Pyo, MD;  Location: [facility] OR;  Service: Open Heart Surgery;  Laterality: N/A;  Transfemoral approach   TUBAL LIGATION  1986   XI ROBOTIC ASSISTED HIATAL HERNIA REPAIR N/A 01/09/2023   Procedure: XI ROBOTIC ASSISTED HIATAL HERNIA REPAIR WITH FUNDOPLICATION;  Surgeon: Corliss Skains, MD;  Location: [facility] OR;  Service: Thoracic;  Laterality: N/A;    Home Medications:  Prior to Admission medications   Medication Sig Start Date End Date Taking? Authorizing Provider  acetaminophen (TYLENOL) 500 MG tablet Take 1,000 mg by mouth daily.   Yes [provider]  apixaban (ELIQUIS) 5 MG TABS tablet Take 1 tablet (5 mg total) by mouth 2 (two) times daily. 12/19/22  Yes Hawks, Christy A, FNP  atorvastatin (LIPITOR) 20 MG tablet Take 1 tablet (20 mg total) by mouth daily. Patient taking differently: Take 20 mg by mouth at bedtime. 12/30/22  Yes Hawks, Christy A, FNP  benazepril-hydrochlorthiazide (LOTENSIN HCT) 20-25 MG tablet Take 1 tablet by mouth daily. 04/11/23  Yes [provider]  carboxymethylcellulose (REFRESH TEARS) 0.5 % SOLN Place 2 drops into both eyes in the morning.   Yes [provider]  digoxin (LANOXIN) 0.125 MG tablet Take 1 tablet (125 mcg total) by mouth daily. 12/19/22  Yes Hawks, Christy A, FNP  furosemide (LASIX) 20 MG tablet Take 1 tablet (20 mg total) by mouth daily as  needed for edema or fluid. 12/19/22  Yes Hawks, Christy A, FNP  levocetirizine (XYZAL) 5 MG tablet Take 1 tablet (5 mg total) by mouth every evening. Patient taking differently: Take 5 mg by mouth 3 (three) times a week. 12/19/22  Yes Hawks, Christy A, FNP  levothyroxine (SYNTHROID) 100 MCG tablet Take 1 tablet (100 mcg total) by mouth daily. 12/12/22 12/12/23 Yes Hawks, Christy A, FNP  metoprolol tartrate (LOPRESSOR) 50 MG tablet Take 1 tablet (50 mg total) by mouth 2 (two) times daily. 12/19/22  Yes Hawks, Christy A, FNP  mometasone (ELOCON) 0.1 % cream Apply 1 Application topically daily. Patient taking differently: Apply 1 Application topically daily as needed (eczema in ears). 12/19/22  Yes Hawks, Christy A, FNP  Multiple Vitamin (MULTIVITAMIN WITH MINERALS) TABS tablet Take 1 tablet by mouth at bedtime.   Yes [provider]  tacrolimus (PROTOPIC) 0.1 % ointment Apply topically in the morning and at bedtime. Patient taking differently: Apply 1 Application topically in the morning. 12/19/22  Yes Autumn Spencer, FNP   Inpatient Medications: Scheduled Meds:  chlorhexidine  60 mL Topical Once   Chlorhexidine Gluconate Cloth  6 each  Topical Daily   digoxin  125 mcg Oral Daily   levothyroxine  100 mcg Oral Daily   metoprolol tartrate  50 mg Oral BID   mupirocin ointment  1 Application Nasal BID   ondansetron (ZOFRAN) IV  4 mg Intravenous Q6H   povidone-iodine  2 Application Topical Once   tacrolimus  1 Application Topical q AM   Continuous Infusions:   ceFAZolin (ANCEF) IV     tranexamic acid     vancomycin     PRN Meds: acetaminophen **OR** acetaminophen, HYDROmorphone (DILAUDID) injection  Allergies:    Allergies  Allergen Reactions   Noroxin [Norfloxacin] Nausea And Vomiting   Augmentin [Amoxicillin-Pot Clavulanate] Swelling    Gums swelling, lips numb   Rosuvastatin Other (See Comments)    Pain in back of legs   Peppermint Spirit Other (See Comments)    Causes sneezing  fits   Social History:   Social History   Socioeconomic History   Marital status: Married    Spouse name: Archivist   Number of children: 2   Years of education: Not on file   Highest education level: Not on file  Occupational History   Not on file  Tobacco Use   Smoking status: Never    Passive exposure: Past   Smokeless tobacco: Never  Vaping Use   Vaping status: Never Used  Substance and Sexual Activity   Alcohol use: No    Alcohol/week: 0.0 standard drinks of alcohol   Drug use: No   Sexual activity: Not Currently    Partners: Male  Other Topics Concern   Not on file  Social History Narrative   Lives with husband.  Married x 56 years in 2022.   2 daughters   5 grand children   1 great grandchild.   Social Drivers of Corporate investment banker Strain: Low Risk  (03/27/2023)   Overall Financial Resource Strain (CARDIA)    Difficulty of Paying Living Expenses: Not hard at all  Food Insecurity: No Food Insecurity (05/13/2023)   Hunger Vital Sign    Worried About Running Out of Food in the Last Year: Never true    Ran Out of Food in the Last Year: Never true  Transportation Needs: No Transportation Needs (05/13/2023)   PRAPARE - Administrator, Civil Service (Medical): No    Lack of Transportation (Non-Medical): No  Physical Activity: Sufficiently Active (03/27/2023)   Exercise Vital Sign    Days of Exercise per Week: 5 days    Minutes of Exercise per Session: 30 min  Stress: No Stress Concern Present (03/27/2023)   [employer] of Occupational Health - Occupational Stress Questionnaire    Feeling of Stress : Not at all  Social Connections: Moderately Integrated (05/13/2023)   Social Connection and Isolation Panel [NHANES]    Frequency of Communication with Friends and Family: Three times a week    Frequency of Social Gatherings with Friends and Family: Twice a week    Attends Religious Services: More than 4 times per year    Active Member of [employer] or  Organizations: No    Attends Banker Meetings: Never    Marital Status: Married  Catering manager Violence: Not At Risk (05/13/2023)   Humiliation, Afraid, Rape, and Kick questionnaire    Fear of Current or Ex-Partner: No    Emotionally Abused: No    Physically Abused: No    Sexually Abused: No    Family History:   Family History  Problem Relation Age of Onset   Migraines Mother    Hypertension Mother    Hyperlipidemia Mother    Heart failure Mother        Age 9   Hyperlipidemia Father    Hypertension Father    Heart disease Father        died of MI after developoing cancer.   Lung cancer Father    Hypertension Sister    Breast cancer Paternal Aunt    Migraines Maternal Grandfather    Migraines Daughter    Breast cancer Daughter     ROS:  Please see the history of present illness.  All other ROS reviewed and negative.     Physical Exam/Data:   Vitals:   05/12/23 2100 05/12/23 2212 05/12/23 2354 05/13/23 0356  BP: (!) 119/55 (!) 160/75 126/76 (!) 109/58  Pulse: 94 92 70 96  Resp: 15 16 15 16   Temp:  98.5 F (36.9 C) 98.5 F (36.9 C) 98.4 F (36.9 C)  TempSrc:  Oral Oral Oral  SpO2: 97% 99% 91% 97%  Weight:    61.8 kg  Height:        Intake/Output Summary (Last 24 hours) at 05/13/2023 0826 Last data filed at 05/13/2023 0500 Gross per 24 hour  Intake 0 ml  Output 675 ml  Net -675 ml      05/13/2023    3:56 AM 05/12/2023    3:25 PM 05/04/2023    4:00 PM  Last 3 Weights  Weight (lbs) 136 lb 3.9 oz 137 lb 135 lb  Weight (kg) 61.8 kg 62.143 kg 61.236 kg     Body mass index is 23.39 kg/m.  General:  Currently in significant amount of pain related to acute injury, on room air  HEENT: normal Neck: no JVD Vascular: No carotid bruits; Distal pulses 2+ bilaterally Cardiac:  normal S1, S2; irregular rhythm, regular rate; no murmur  Lungs:  clear to auscultation bilaterally, no wheezing, rhonchi or rales  Abd: soft, nontender Ext: no  edema Musculoskeletal:  No deformities Skin: warm and dry  Neuro:  no focal abnormalities noted  EKG:  The EKG was personally reviewed and demonstrates:  atrial fibrillation, HR 76, frequent PVCs   Relevant CV Studies: Echocardiogram 12/14/2022 IMPRESSIONS   1. Left ventricular ejection fraction, by estimation, is 60 to 65%. The  left ventricle has normal function. The left ventricle has no regional  wall motion abnormalities. Left ventricular diastolic parameters are  indeterminate.   2. Right ventricular systolic function is normal. The right ventricular  size is normal.   3. The mitral valve is normal in structure. Mild to moderate mitral valve  regurgitation. No evidence of mitral stenosis.   4. Tricuspid valve regurgitation is moderate to severe.   5. Well-seated bioprosthetic valve. The aortic valve has been  repaired/replaced. Aortic valve regurgitation is trivial. No aortic  stenosis is present. There is a 23 mm Sapien prosthetic (TAVR) valve  present in the aortic position. Aortic valve mean  gradient measures 15.2 mmHg. Aortic valve Vmax measures 2.43 m/s.   6. The inferior vena cava is normal in size with greater than 50%  respiratory variability, suggesting right atrial pressure of 3 mmHg.   Laboratory Data:  High Sensitivity Troponin:  No results for input(s): "TROPONINIHS" in the last 720 hours.   Chemistry Recent Labs  Lab 05/12/23 1613  NA 139  K 3.4*  CL 100  CO2 25  GLUCOSE 127*  BUN 20  CREATININE 1.22*  CALCIUM 9.7  MG 1.8  GFRNONAA 45*  ANIONGAP 14    No results for input(s): "PROT", "ALBUMIN", "AST", "ALT", "ALKPHOS", "BILITOT" in the last 168 hours. Lipids No results for input(s): "CHOL", "TRIG", "HDL", "LABVLDL", "LDLCALC", "CHOLHDL" in the last 168 hours.  Hematology Recent Labs  Lab 05/12/23 1613 05/13/23 0445  WBC 9.8 12.9*  RBC 4.48 4.10  HGB 12.9 12.1  HCT 38.7 35.1*  MCV 86.4 85.6  MCH 28.8 29.5  MCHC 33.3 34.5  RDW 13.3 13.4   PLT 225 200   Thyroid  Recent Labs  Lab 05/12/23 2230  TSH 0.164*    BNPNo results for input(s): "BNP", "PROBNP" in the last 168 hours.  DDimer No results for input(s): "DDIMER" in the last 168 hours.  Radiology/Studies:  DG Knee Left Port Result Date: 05/12/2023 CLINICAL DATA:  Fall, left knee and leg pain EXAM: PORTABLE LEFT KNEE - 1-2 VIEW COMPARISON:  None Available. FINDINGS: No evidence of fracture, dislocation, or joint effusion. No evidence of arthropathy or other focal bone abnormality. Soft tissues are unremarkable. IMPRESSION: Negative. Electronically Signed   By: Charlett Nose M.D.   On: 05/12/2023 20:24   CT Head Wo Contrast Result Date: 05/12/2023 CLINICAL DATA:  Acute onset of dizziness after getting out of a car. The patient fell with left-sided injuries. EXAM: CT HEAD WITHOUT CONTRAST TECHNIQUE: Contiguous axial images were obtained from the base of the skull through the vertex without intravenous contrast. RADIATION DOSE REDUCTION: This exam was performed according to the departmental dose-optimization program which includes automated exposure control, adjustment of the mA and/or kV according to patient size and/or use of iterative reconstruction technique. COMPARISON:  MR head without contrast 04/21/2022. FINDINGS: Brain: No acute infarct, hemorrhage, or mass lesion is present. No significant white matter lesions are present. Deep brain nuclei are within normal limits. The ventricles are of normal size. No significant extraaxial fluid collection is present. The brainstem and cerebellum are within normal limits. Midline structures are within normal limits. Vascular: No hyperdense vessel or unexpected calcification. Skull: Calvarium is intact. No focal lytic or blastic lesions are present. No significant extracranial soft tissue lesion is present. Sinuses/Orbits: The paranasal sinuses and mastoid air cells are clear. Bilateral lens replacements are noted. Globes and orbits are  otherwise unremarkable. IMPRESSION: Negative CT of the head. Electronically Signed   By: Marin Roberts M.D.   On: 05/12/2023 18:56   CT Cervical Spine Wo Contrast Result Date: 05/12/2023 CLINICAL DATA:  Fall. Patient is on Eliquis. Patient became dizzy after getting out of a car. EXAM: CT CERVICAL SPINE WITHOUT CONTRAST TECHNIQUE: Multidetector CT imaging of the cervical spine was performed without intravenous contrast. Multiplanar CT image reconstructions were also generated. RADIATION DOSE REDUCTION: This exam was performed according to the departmental dose-optimization program which includes automated exposure control, adjustment of the mA and/or kV according to patient size and/or use of iterative reconstruction technique. COMPARISON:  None Available. FINDINGS: Alignment: No significant listhesis is present. Normal cervical lordosis is present. Skull base and vertebrae: Craniocervical junction is within normal limits. The vertebral body heights are normal. No acute fractures are present. Soft tissues and spinal canal: No prevertebral fluid or swelling. No visible canal hematoma. Disc levels: Significant focal disc protrusion or stenosis is present. Uncovertebral spurring contributes to mild foraminal narrowing, left greater than right at C5-6. Upper chest: The lung apices are clear. The thoracic inlet is within normal limits. IMPRESSION: 1. No acute fracture or traumatic subluxation. 2. Mild foraminal narrowing, left  greater than right at C5-6. Electronically Signed   By: Marin Roberts M.D.   On: 05/12/2023 18:53   DG Hip Unilat With Pelvis 2-3 Views Left Result Date: 05/12/2023 CLINICAL DATA:  Left hip pain after fall. EXAM: DG HIP (WITH OR WITHOUT PELVIS) 2-3V LEFT COMPARISON:  None Available. FINDINGS: Moderately displaced proximal left femoral neck fracture is noted. IMPRESSION: Moderately displaced proximal left femoral neck fracture. Electronically Signed   By: Lupita Raider M.D.    On: 05/12/2023 16:21   Assessment and Plan:   Severe AS s/p TAVR 11/2021 Mild to moderate TR Moderate to severe TR Last echo from 11/2022 showed: LVEF 60%, normally functioning TAVR, mean gradient 15.2 mmHg, no PVL, mild to moderate MR, moderate to severe TR Patient denies any chest pain, shortness of breath, dizziness, episodes of syncope/pre-syncope, palpitations Reports performing activities > 4 METs without any symptoms RCRI score of 0, putting her at a 0.4% perioperative risk of major cardiac event Patient is at a low perioperative cardiac risk  Okay to proceed with ortho surgery today as perioperative cardiac risk is low   Essential hypertension Most recent BP 109/58 Continue Lopressor 50 mg BID  Persistent atrial fibrillation Most recent HR 96  CHA2DS2-VASc Score = 4  Digoxin level 1.1 Continue digoxin 0.125 mg daily Continue Lopressor 50 mg BID Eliquis held for surgery, last dose 2/21   Hypertriglyceridemia  Most recent lipid panel 05/2022: total 165, HDL 43, LDL 86, trigs 214  Was previously on Lipitor 20 mg daily, continue at discharge   Per primary Left hip fracture  Hypothyroidism  Hypokalemia   Risk Assessment/Risk Scores:       CHA2DS2-VASc Score = 4   This indicates a 4.8% annual risk of stroke. The patient's score is based upon: CHF History: 0 HTN History: 1 Diabetes History: 0 Stroke History: 0 Vascular Disease History: 0 Age Score: 2 Gender Score: 1       For questions or updates, please contact [facility] Please consult [email or web address] for contact info under    Signed, Olena Leatherwood, PA-C  05/13/2023 8:26 AM

## 2023-05-14 DIAGNOSIS — S72002A Fracture of unspecified part of neck of left femur, initial encounter for closed fracture: Secondary | ICD-10-CM | POA: Diagnosis not present

## 2023-05-14 DIAGNOSIS — Z952 Presence of prosthetic heart valve: Secondary | ICD-10-CM | POA: Diagnosis not present

## 2023-05-14 DIAGNOSIS — I482 Chronic atrial fibrillation, unspecified: Secondary | ICD-10-CM | POA: Diagnosis not present

## 2023-05-14 LAB — CBC
HCT: 30.4 % — ABNORMAL LOW (ref 36.0–46.0)
Hemoglobin: 10.3 g/dL — ABNORMAL LOW (ref 12.0–15.0)
MCH: 29.4 pg (ref 26.0–34.0)
MCHC: 33.9 g/dL (ref 30.0–36.0)
MCV: 86.9 fL (ref 80.0–100.0)
Platelets: 176 10*3/uL (ref 150–400)
RBC: 3.5 MIL/uL — ABNORMAL LOW (ref 3.87–5.11)
RDW: 13.5 % (ref 11.5–15.5)
WBC: 17.9 10*3/uL — ABNORMAL HIGH (ref 4.0–10.5)
nRBC: 0 % (ref 0.0–0.2)

## 2023-05-14 LAB — BASIC METABOLIC PANEL
Anion gap: 14 (ref 5–15)
BUN: 30 mg/dL — ABNORMAL HIGH (ref 8–23)
CO2: 23 mmol/L (ref 22–32)
Calcium: 8.8 mg/dL — ABNORMAL LOW (ref 8.9–10.3)
Chloride: 99 mmol/L (ref 98–111)
Creatinine, Ser: 1.1 mg/dL — ABNORMAL HIGH (ref 0.44–1.00)
GFR, Estimated: 51 mL/min — ABNORMAL LOW (ref >60)
Glucose, Bld: 176 mg/dL — ABNORMAL HIGH (ref 70–99)
Potassium: 4.1 mmol/L (ref 3.5–5.1)
Sodium: 136 mmol/L (ref 135–145)

## 2023-05-14 MED ORDER — SODIUM CHLORIDE 0.9 % IV SOLN: INTRAVENOUS | Status: AC

## 2023-05-14 NOTE — Evaluation (Signed)
 Physical Therapy Evaluation Patient Details Name: Autumn Moyer MRN: [id number] DOB: 03-05-1941 Today's Date: 05/14/2023  History of Present Illness  83 y.o. female presents to [facility] 05/12/23 after falling in the yard w/ L displaced femoral neck fx. S/p L THA via anterior approach on 2/22. PMHx: vertigo, a-fib, arthritis, heart murmur s/p TAVR 2023, HTN,   Clinical Impression  Pt in bed upon arrival with family present and agreeable to PT eval. PTA, pt was independent with no AD. In today's session, pt was able to stand with CGA and RW and ambulate ~120 ft with supervision. Pt currently with functional limitations due to the deficits listed below (see PT Problem List). Pt would benefit from acute skilled PT to address functional impairments. Recommending post-acute HHPT to work towards independence with mobility. Acute PT to follow.     BP Readings: Supine: 107/55, 69; 93 BPM Sitting EOB: 124/59, 77; 95 BPM Standing: 126/60    If plan is discharge home, recommend the following: A little help with walking and/or transfers;A little help with bathing/dressing/bathroom;Assist for transportation;Help with stairs or ramp for entrance   Can travel by private vehicle    Yes    Equipment Recommendations None recommended by PT     Functional Status Assessment Patient has had a recent decline in their functional status and demonstrates the ability to make significant improvements in function in a reasonable and predictable amount of time.     Precautions / Restrictions Precautions Precautions: Fall Restrictions Weight Bearing Restrictions Per Provider Order: Yes LLE Weight Bearing Per Provider Order: Weight bearing as tolerated      Mobility  Bed Mobility Overal bed mobility: Needs Assistance Bed Mobility: Supine to Sit    Supine to sit: Contact guard, HOB elevated    General bed mobility comments: CGA for safety    Transfers Overall transfer level: Needs assistance Equipment used:  Rolling walker (2 wheels) Transfers: Sit to/from Stand Sit to Stand: Contact guard assist    General transfer comment: CGA for safety, cues for hand placement    Ambulation/Gait Ambulation/Gait assistance: Supervision Gait Distance (Feet): 120 Feet Assistive device: Rolling walker (2 wheels) Gait Pattern/deviations: Step-to pattern, Step-through pattern, Decreased weight shift to left Gait velocity: decr     General Gait Details: progressed from step to gait pattern to step through pattern w/ cues for technique. Decreased WB on L LE    Balance Overall balance assessment: Needs assistance, Mild deficits observed, not formally tested, History of Falls Sitting-balance support: No upper extremity supported, Feet supported Sitting balance-Leahy Scale: Good     Standing balance support: Bilateral upper extremity supported, During functional activity, Reliant on assistive device for balance Standing balance-Leahy Scale: Poor Standing balance comment: reliant on RW       Pertinent Vitals/Pain Pain Assessment Pain Assessment: No/denies pain    Home Living Family/patient expects to be discharged to:: Private residence Living Arrangements: Spouse/significant other Available Help at Discharge: Family;Available 24 hours/day Type of Home: House Home Access: Stairs to enter Entrance Stairs-Rails: Right;Left;Can reach both Entrance Stairs-Number of Steps: 1   Home Layout: Two level;Full bath on main level;Able to live on main level with bedroom/bathroom Home Equipment: Shower seat;Rolling Walker (2 wheels);Wheelchair - manual;BSC/3in1      Prior Function Prior Level of Function : Independent/Modified Independent;Driving      Mobility Comments: no AD       Extremity/Trunk Assessment   Upper Extremity Assessment Upper Extremity Assessment: Defer to OT evaluation    Lower Extremity Assessment  Lower Extremity Assessment: LLE deficits/detail LLE Deficits / Details: WFL ROM,  able to SLR    Cervical / Trunk Assessment Cervical / Trunk Assessment: Normal  Communication   Communication Communication: No apparent difficulties    Cognition Arousal: Alert Behavior During Therapy: WFL for tasks assessed/performed   PT - Cognitive impairments: No apparent impairments    Following commands: Intact       Cueing Cueing Techniques: Verbal cues     General Comments General comments (skin integrity, edema, etc.): VSS on RA, no complaints of dizzines or lightheadedness. Family present and supportive    Exercises General Exercises - Lower Extremity Ankle Circles/Pumps: AROM, Both, 10 reps, Supine Long Arc Quad: AROM, 20 reps, Seated, Both   Assessment/Plan    PT Assessment Patient needs continued PT services  PT Problem List Decreased strength;Decreased range of motion;Decreased activity tolerance;Decreased balance;Decreased mobility       PT Treatment Interventions DME instruction;Gait training;Stair training;Functional mobility training;Therapeutic activities;Therapeutic exercise;Balance training;Neuromuscular re-education;Patient/family education    PT Goals (Current goals can be found in the Care Plan section)  Acute Rehab PT Goals Patient Stated Goal: to get stronger PT Goal Formulation: With patient/family Time For Goal Achievement: 05/28/23 Potential to Achieve Goals: Good    Frequency Min 1X/week        AM-PAC PT "6 Clicks" Mobility  Outcome Measure Help needed turning from your back to your side while in a flat bed without using bedrails?: A Little Help needed moving from lying on your back to sitting on the side of a flat bed without using bedrails?: A Little Help needed moving to and from a bed to a chair (including a wheelchair)?: A Little Help needed standing up from a chair using your arms (e.g., wheelchair or bedside chair)?: A Little Help needed to walk in hospital room?: A Little Help needed climbing 3-5 steps with a railing? : A  Little 6 Click Score: 18    End of Session Equipment Utilized During Treatment: Gait belt Activity Tolerance: Patient tolerated treatment well Patient left: in chair;with call bell/phone within reach;with family/visitor present;with nursing/sitter in room Nurse Communication: Mobility status PT Visit Diagnosis: Other abnormalities of gait and mobility (R26.89);Muscle weakness (generalized) (M62.81)    Time: 1610-9604 PT Time Calculation (min) (ACUTE ONLY): 33 min   Charges:   PT Evaluation $PT Eval Low Complexity: 1 Low PT Treatments $Gait Training: 8-22 mins PT General Charges $$ ACUTE PT VISIT: 1 Visit        Hilton Cork, PT, DPT Secure Chat Preferred  Rehab Office [phone]   Arturo Morton Brion Aliment 05/14/2023, 1:26 PM

## 2023-05-14 NOTE — Progress Notes (Signed)
 Patient's blood pressure is 97/47. RN consulted Dr. Rhona Leavens who advised that metoprolol should be held and to encourage fluid intake. RN educated patient on importance on increased fluid intake to increase her blood pressure. Patient requested two diet ginger ales and two cups of water. Patient observed in taking the requested fluids. She is presently asymptomatic.

## 2023-05-14 NOTE — Plan of Care (Signed)

## 2023-05-14 NOTE — Progress Notes (Signed)
 Patient ID: Autumn Moyer, female   DOB: 02-04-1941, 83 y.o.   MRN: [id number] Subjective: 1 Day Post-Op Procedure(s) (LRB): TOTAL HIP ARTHROPLASTY ANTERIOR APPROACH (Left)    Patient reports pain as mild. Doing well this morning, daughter by bedside. No events overnight  Objective:   VITALS:   Vitals:   05/14/23 0018 05/14/23 0553  BP: (!) 98/50 (!) 96/52  Pulse: 89 73  Resp: 16   Temp: 98.2 F (36.8 C) 98.2 F (36.8 C)  SpO2: 98% 98%    Neurovascular intact Incision: dressing C/D/I  LABS Recent Labs    05/12/23 1613 05/13/23 0445 05/14/23 0624  HGB 12.9 12.1 10.3*  HCT 38.7 35.1* 30.4*  WBC 9.8 12.9* 17.9*  PLT 225 200 176    Recent Labs    05/12/23 1613 05/14/23 0624  NA 139 136  K 3.4* 4.1  BUN 20 30*  CREATININE 1.22* 1.10*  GLUCOSE 127* 176*    No results for input(s): "LABPT", "INR" in the last 72 hours.   Assessment/Plan: 1 Day Post-Op Procedure(s) (LRB): TOTAL HIP ARTHROPLASTY ANTERIOR APPROACH (Left)   Advance diet Up with therapy - WBAT LLE RTC in 2 weeks Desires to be discharged to home, reviewed goals On eliquis at home, resume as DVT prophylaxis as well

## 2023-05-14 NOTE — Plan of Care (Signed)
  Problem: Activity: Goal: Risk for activity intolerance will decrease Outcome: Progressing   Problem: Pain Management: Goal: Pain level will decrease with appropriate interventions Outcome: Progressing   

## 2023-05-14 NOTE — Progress Notes (Signed)
 Patient stated that she does not want her foley removed until after she is seen by physical therapy today. Patient educated on infection risk related to prolonged foley use.

## 2023-05-14 NOTE — Progress Notes (Signed)
  Progress Note   Patient: Autumn Moyer ZOX:[id number] DOB: 1941-01-07 DOA: 05/12/2023     2 DOS: the patient was seen and examined on 05/14/2023   Brief hospital course: 83 y.o. female with medical history significant for atrial fibrillation on Eliquis, hypothyroidism, TAVR in 2023, and Hiatal hernia repair 2024 presented after falling in the yard today.  After her here appointment this afternoon she felt like she was being pulled to one side while walking.  She held on for minute and it seemed to get better.  She was able to drive without any problem.  She got out of the car to help her sister into the house initially feeling fine but then she says she felt like her head weighed 100 pounds and had pulled her over and she ended up on the ground.  She says she has felt this sensation before but not so intensely.  She has had vertigo in the past as well but that was a slightly different spinning sensation.  She did not lose consciousness but was not able to walk.  Evaluation in the emergency department revealed a left displaced neck fracture.  She has been seen by orthopedics and will require surgical repair.  The patient denies any chest pain or change in her baseline shortness of breath but says her head still feels not normal.    Assessment and Plan: L hip fracture -Orthopedic Surgery following and pt is now s/p surgery 2/22 -Continue with analgesia as needed -F/u on therapy recs  Afib  -HR controlled and stable -Continued on metoprolol -apixaban to resume on 2/23  Chronic anticoagulation -Apixaban to resumed on 2/23 -follow cbc trends  Hypokalemia -corrected -cont to follow BMET trends  Hx TAVR -Seems stable at this time  HTN -BP soft this aM -Cont metoprolol with hold parameters -Providing gentle IVF  Dehydration -Membranes appear dry -BUN elevated at 30 -Will cont on gentle NS  Subjective: Without complaints, eager to go home  Physical Exam: Vitals:   05/14/23 0553  05/14/23 0742 05/14/23 0957 05/14/23 1357  BP: (!) 96/52 (!) 97/47 (!) 92/54 (!) 101/53  Pulse: 73 74 82 99  Resp:  17  17  Temp: 98.2 F (36.8 C) 98.2 F (36.8 C)  98.2 F (36.8 C)  TempSrc: Oral Oral  Oral  SpO2: 98% 97%  97%  Weight:      Height:       General exam: Conversant, in no acute distress, membranes appear dry Respiratory system: normal chest rise, clear, no audible wheezing Cardiovascular system: regular rhythm, s1-s2 Gastrointestinal system: Nondistended, nontender, pos BS Central nervous system: No seizures, no tremors Extremities: No cyanosis, no joint deformities Skin: No rashes, no pallor Psychiatry: Affect normal // no auditory hallucinations   Data Reviewed:  Labs reviewed: Na 136, K 4.1, Cr 1.10, WBC 17.9, Hgb 10.3, Plts 176   Family Communication: Pt in room, family at bedside  Disposition: Status is: Inpatient Remains inpatient appropriate because: severity of illness  Planned Discharge Destination: Home with Home Health    Author: Rickey Barbara, MD 05/14/2023 3:07 PM  For on call review [email or web address].

## 2023-05-14 NOTE — Progress Notes (Signed)
 Cardiologist:  Hochrein  Subjective:  Denies SSCP, palpitations or Dyspnea Feels much better post surgery   Objective:  Vitals:   05/13/23 2020 05/14/23 0018 05/14/23 0553 05/14/23 0742  BP: 106/65 (!) 98/50 (!) 96/52 (!) 97/47  Pulse: 89 89 73 74  Resp:  16  17  Temp: 97.8 F (36.6 C) 98.2 F (36.8 C) 98.2 F (36.8 C) 98.2 F (36.8 C)  TempSrc: Oral Oral Oral Oral  SpO2: 96% 98% 98% 97%  Weight:      Height:        Intake/Output from previous day:  Intake/Output Summary (Last 24 hours) at 05/14/2023 0834 Last data filed at 05/13/2023 1445 Gross per 24 hour  Intake 650 ml  Output 800 ml  Net -150 ml    Physical Exam:  Elderly female  Lungs clear SEM through TAVR valve no AR Abdomen benign eating breakfast Post left THR  Lab Results: Basic Metabolic Panel: Recent Labs    05/12/23 1613 05/14/23 0624  NA 139 136  K 3.4* 4.1  CL 100 99  CO2 25 23  GLUCOSE 127* 176*  BUN 20 30*  CREATININE 1.22* 1.10*  CALCIUM 9.7 8.8*  MG 1.8  --     CBC: Recent Labs    05/13/23 0445 05/14/23 0624  WBC 12.9* 17.9*  HGB 12.1 10.3*  HCT 35.1* 30.4*  MCV 85.6 86.9  PLT 200 176    Thyroid Function Tests: Recent Labs    05/12/23 2230  TSH 0.164*     Imaging: DG Pelvis Portable Result Date: 05/13/2023 CLINICAL DATA:  Postoperative. Status post total left hip arthroplasty. EXAM: PORTABLE PELVIS 1-2 VIEWS COMPARISON:  Pelvis and left hip radiographs 05/12/2023 FINDINGS: There is diffuse decreased bone mineralization. Interval total left hip arthroplasty. No perihardware lucency is seen to indicate hardware failure or loosening. Expected postoperative lateral left hip subcutaneous air. Bilateral sacroiliac, right femoroacetabular, and pubic symphysis joint spaces are maintained. Minimal superior pubic symphysis degenerative spurring. Moderate right L3-4 disc space narrowing. No acute fracture or dislocation. IMPRESSION: Interval total left hip arthroplasty without  evidence of hardware failure. Electronically Signed   By: Neita Garnet M.D.   On: 05/13/2023 16:00   DG HIP UNILAT WITH PELVIS 1V LEFT Result Date: 05/13/2023 CLINICAL DATA:  Anterior left hip arthroplasty. Intraoperative fluoroscopy. EXAM: DG HIP (WITH OR WITHOUT PELVIS) 1V*L* COMPARISON:  Pelvis and left hip radiographs 05/12/2023 FINDINGS: Images were performed intraoperatively without the presence of a radiologist. Left femoral neck fracture is again noted. The patient is undergoing total left hip arthroplasty. No hardware complication is seen. Total fluoroscopy images: 9 Total fluoroscopy time: 19 seconds Total dose: Radiation Exposure Index (as provided by the fluoroscopic device): 1.74 mGy air Kerma Please see intraoperative findings for further detail. IMPRESSION: Intraoperative fluoroscopy for total left hip arthroplasty. Electronically Signed   By: Neita Garnet M.D.   On: 05/13/2023 15:59   DG C-Arm 1-60 Min-No Report Result Date: 05/13/2023 Fluoroscopy was utilized by the requesting physician.  No radiographic interpretation.   DG Knee Left Port Result Date: 05/12/2023 CLINICAL DATA:  Fall, left knee and leg pain EXAM: PORTABLE LEFT KNEE - 1-2 VIEW COMPARISON:  None Available. FINDINGS: No evidence of fracture, dislocation, or joint effusion. No evidence of arthropathy or other focal bone abnormality. Soft tissues are unremarkable. IMPRESSION: Negative. Electronically Signed   By: Charlett Nose M.D.   On: 05/12/2023 20:24   CT Head Wo Contrast Result Date: 05/12/2023 CLINICAL DATA:  Acute onset  of dizziness after getting out of a car. The patient fell with left-sided injuries. EXAM: CT HEAD WITHOUT CONTRAST TECHNIQUE: Contiguous axial images were obtained from the base of the skull through the vertex without intravenous contrast. RADIATION DOSE REDUCTION: This exam was performed according to the departmental dose-optimization program which includes automated exposure control, adjustment of  the mA and/or kV according to patient size and/or use of iterative reconstruction technique. COMPARISON:  MR head without contrast 04/21/2022. FINDINGS: Brain: No acute infarct, hemorrhage, or mass lesion is present. No significant white matter lesions are present. Deep brain nuclei are within normal limits. The ventricles are of normal size. No significant extraaxial fluid collection is present. The brainstem and cerebellum are within normal limits. Midline structures are within normal limits. Vascular: No hyperdense vessel or unexpected calcification. Skull: Calvarium is intact. No focal lytic or blastic lesions are present. No significant extracranial soft tissue lesion is present. Sinuses/Orbits: The paranasal sinuses and mastoid air cells are clear. Bilateral lens replacements are noted. Globes and orbits are otherwise unremarkable. IMPRESSION: Negative CT of the head. Electronically Signed   By: Marin Roberts M.D.   On: 05/12/2023 18:56   CT Cervical Spine Wo Contrast Result Date: 05/12/2023 CLINICAL DATA:  Fall. Patient is on Eliquis. Patient became dizzy after getting out of a car. EXAM: CT CERVICAL SPINE WITHOUT CONTRAST TECHNIQUE: Multidetector CT imaging of the cervical spine was performed without intravenous contrast. Multiplanar CT image reconstructions were also generated. RADIATION DOSE REDUCTION: This exam was performed according to the departmental dose-optimization program which includes automated exposure control, adjustment of the mA and/or kV according to patient size and/or use of iterative reconstruction technique. COMPARISON:  None Available. FINDINGS: Alignment: No significant listhesis is present. Normal cervical lordosis is present. Skull base and vertebrae: Craniocervical junction is within normal limits. The vertebral body heights are normal. No acute fractures are present. Soft tissues and spinal canal: No prevertebral fluid or swelling. No visible canal hematoma. Disc levels:  Significant focal disc protrusion or stenosis is present. Uncovertebral spurring contributes to mild foraminal narrowing, left greater than right at C5-6. Upper chest: The lung apices are clear. The thoracic inlet is within normal limits. IMPRESSION: 1. No acute fracture or traumatic subluxation. 2. Mild foraminal narrowing, left greater than right at C5-6. Electronically Signed   By: Marin Roberts M.D.   On: 05/12/2023 18:53   DG Hip Unilat With Pelvis 2-3 Views Left Result Date: 05/12/2023 CLINICAL DATA:  Left hip pain after fall. EXAM: DG HIP (WITH OR WITHOUT PELVIS) 2-3V LEFT COMPARISON:  None Available. FINDINGS: Moderately displaced proximal left femoral neck fracture is noted. IMPRESSION: Moderately displaced proximal left femoral neck fracture. Electronically Signed   By: Lupita Raider M.D.   On: 05/12/2023 16:21    Cardiac Studies:  ECG: afib PVC   Echo: 11/2022 EF 60-65% normal TAVR valve mean gradient 15 mmHg size 23 Sapien  Medications:    acetaminophen  1,000 mg Oral Q6H   apixaban  5 mg Oral Q12H   dexamethasone (DECADRON) injection  10 mg Intravenous Once   digoxin  125 mcg Oral Daily   levothyroxine  100 mcg Oral Daily   metoprolol tartrate  50 mg Oral BID   polyethylene glycol  17 g Oral BID   senna  2 tablet Oral QHS   sodium chloride flush  3-10 mL Intravenous Q12H      Assessment/Plan:   Post operative:  no cardiac complications post total Left HR for mechanical  fall.  TAVR:  normal valve function no PVL no change in murmur Afib:  chronic continue eliquis and rate control with dig and lopressor  Cardiology will sign off   Charlton Haws 05/14/2023, 8:34 AM

## 2023-05-15 ENCOUNTER — Other Ambulatory Visit (HOSPITAL_COMMUNITY): Payer: Self-pay

## 2023-05-15 ENCOUNTER — Encounter (HOSPITAL_COMMUNITY): Payer: Self-pay | Admitting: Orthopedic Surgery

## 2023-05-15 DIAGNOSIS — S72002A Fracture of unspecified part of neck of left femur, initial encounter for closed fracture: Secondary | ICD-10-CM | POA: Diagnosis not present

## 2023-05-15 LAB — COMPREHENSIVE METABOLIC PANEL
ALT: 13 U/L (ref 0–44)
AST: 21 U/L (ref 15–41)
Albumin: 2.5 g/dL — ABNORMAL LOW (ref 3.5–5.0)
Alkaline Phosphatase: 39 U/L (ref 38–126)
Anion gap: 6 (ref 5–15)
BUN: 24 mg/dL — ABNORMAL HIGH (ref 8–23)
CO2: 27 mmol/L (ref 22–32)
Calcium: 8.5 mg/dL — ABNORMAL LOW (ref 8.9–10.3)
Chloride: 105 mmol/L (ref 98–111)
Creatinine, Ser: 0.7 mg/dL (ref 0.44–1.00)
GFR, Estimated: >60 mL/min (ref >60)
Glucose, Bld: 148 mg/dL — ABNORMAL HIGH (ref 70–99)
Potassium: 4.2 mmol/L (ref 3.5–5.1)
Sodium: 138 mmol/L (ref 135–145)
Total Bilirubin: 1 mg/dL (ref 0.0–1.2)
Total Protein: 5.8 g/dL — ABNORMAL LOW (ref 6.5–8.1)

## 2023-05-15 LAB — CBC
HCT: 27.4 % — ABNORMAL LOW (ref 36.0–46.0)
Hemoglobin: 9.3 g/dL — ABNORMAL LOW (ref 12.0–15.0)
MCH: 29.5 pg (ref 26.0–34.0)
MCHC: 33.9 g/dL (ref 30.0–36.0)
MCV: 87 fL (ref 80.0–100.0)
Platelets: 163 10*3/uL (ref 150–400)
RBC: 3.15 MIL/uL — ABNORMAL LOW (ref 3.87–5.11)
RDW: 13.7 % (ref 11.5–15.5)
WBC: 18.7 10*3/uL — ABNORMAL HIGH (ref 4.0–10.5)
nRBC: 0 % (ref 0.0–0.2)

## 2023-05-15 LAB — T4, FREE: Free T4: 1.29 ng/dL — ABNORMAL HIGH (ref 0.61–1.12)

## 2023-05-15 MED ORDER — METHOCARBAMOL 500 MG PO TABS: 500.0000 mg | ORAL_TABLET | Freq: Four times a day (QID) | ORAL | 0 refills | Status: DC | PRN

## 2023-05-15 MED ORDER — LEVOTHYROXINE SODIUM 75 MCG PO TABS
75.0000 ug | ORAL_TABLET | Freq: Every day | ORAL | 0 refills | Status: DC
  Filled 2023-05-15: qty 30, 30d supply, fill #0

## 2023-05-15 MED ORDER — LEVOTHYROXINE SODIUM 75 MCG PO TABS: 75.0000 ug | ORAL_TABLET | Freq: Every day | ORAL | Status: DC

## 2023-05-15 NOTE — Progress Notes (Addendum)
 DISCHARGE NOTE HOME Autumn Moyer to be discharged Home per MD order. Discussed prescriptions and follow up appointments with the patient. Prescriptions given to patient; medication list explained in detail. Patient verbalized understanding.  Skin clean, dry and intact without evidence of skin break down, no evidence of skin tears noted. IV catheter discontinued intact. Site without signs and symptoms of complications. Dressing and pressure applied. Pt denies pain at the site currently. No complaints noted.  Patient free of lines, drains, and wounds other than noted on LDA  An After Visit Summary (AVS) was printed and given to the patient. Patient escorted via wheelchair, and discharged home via private auto.  Velia Meyer, RN

## 2023-05-15 NOTE — TOC Transition Note (Signed)
 Transition of Care [facility]) - Discharge Note   Patient Details  Name: Autumn Moyer MRN: [id number] Date of Birth: February 28, 1941  Transition of Care [facility]) CM/SW Contact:  Epifanio Lesches, RN Phone Number: 05/15/2023, 1:14 PM   Clinical Narrative:    Patient will DC to: home Anticipated DC date: 05/15/2023 Family notified: yes Transport by: car      S/p Left total hip replacement, 05/13/2023  Per MD patient ready for DC today. RN, patient, and  patient's family notified of DC. Pt without DME needs. Pt agreeable to home health services. Pt without provider preference. Referral made with [facility] for home health services and accepted.  Pt without RX med concerns or transportation issues.  RNCM will sign off for now as intervention is no longer needed. Please consult [place] again if new needs arise.   Final next level of care: Home w Home Health Services Barriers to Discharge: No Barriers Identified   Patient Goals and CMS Choice     Choice offered to / list presented to : Patient      Discharge Placement                       Discharge Plan and Services Additional resources added to the After Visit Summary for                            [facility] Arranged: PT [facility] Agency: [facility] Date [facility] Agency Contacted: 05/15/23 Time HH Agency Contacted: 1314 Representative spoke with at [facility] Agency: Amy  Social Drivers of Health (SDOH) Interventions SDOH Screenings   Food Insecurity: No Food Insecurity (05/13/2023)  Housing: Low Risk  (05/13/2023)  Transportation Needs: No Transportation Needs (05/13/2023)  Utilities: Not At Risk (05/13/2023)  Alcohol Screen: Low Risk  (03/27/2023)  Depression (PHQ2-9): Low Risk  (03/27/2023)  Financial Resource Strain: Low Risk  (03/27/2023)  Physical Activity: Sufficiently Active (03/27/2023)  Social Connections: Moderately Integrated (05/13/2023)  Stress: No Stress Concern Present (03/27/2023)  Tobacco Use: Low Risk  (05/13/2023)  Health  Literacy: Adequate Health Literacy (03/27/2023)     Readmission Risk Interventions    12/15/2021   10:41 AM  Readmission Risk Prevention Plan  Post Dischage Appt Complete  Medication Screening Complete  Transportation Screening Complete

## 2023-05-15 NOTE — Discharge Summary (Signed)
 Physician Discharge Summary   Patient: Autumn Moyer MRN: [id number] DOB: [birth year]  Admit date:     05/12/2023  Discharge date: 05/15/23  Discharge Physician: Rickey Barbara   PCP: [name], FNP   Recommendations at discharge:    Follow up with PCP in 1-2 weeks Follow up with Orthopedic Surgery as scheduled Recommend recheck TSH in 4-6 weeks. Note that TSH was low, thus have decreased synthroid from to Please follow up on free T4 level  Discharge Diagnoses: Principal Problem:   Hip fracture, left (HCC) Active Problems:   Atrial fibrillation (HCC)   Long term current use of anticoagulant therapy   S/P TAVR (transcatheter aortic valve replacement)   Preoperative cardiovascular examination   S/P total left hip arthroplasty  Resolved Problems:   * No resolved hospital problems. *  Hospital Course: 83 y.o. female with medical history significant for atrial fibrillation on Eliquis, hypothyroidism, TAVR in 2023, and Hiatal hernia repair 2024 presented after falling in the yard today.  After her here appointment this afternoon she felt like she was being pulled to one side while walking.  She held on for minute and it seemed to get better.  She was able to drive without any problem.  She got out of the car to help her sister into the house initially feeling fine but then she says she felt like her head weighed 100 pounds and had pulled her over and she ended up on the ground.  She says she has felt this sensation before but not so intensely.  She has had vertigo in the past as well but that was a slightly different spinning sensation.  She did not lose consciousness but was not able to walk.  Evaluation in the emergency department revealed a left displaced neck fracture.  She has been seen by orthopedics and will require surgical repair.  The patient denies any chest pain or change in her baseline shortness of breath but says her head still feels not normal.    Assessment  and Plan: L hip fracture -Orthopedic Surgery following and pt is now s/p surgery 2/22 -Continue with analgesia as needed -F/u on therapy recs   Afib  -HR controlled and stable -Continued on metoprolol -apixaban resumed on 2/23   Chronic anticoagulation -Apixaban to resumed on 2/23   Hypokalemia -corrected   Hx TAVR -Seems stable at this time   HTN -BP soft earlier this admit. Improved with gentle IVF -Continued metoprolol with hold parameters   Dehydration -Membranes appeared dry earlier this course with an elevated BUN -Improved with gentle IVF -Encouraged continued PO intake  Hypothyroid -Pt had been on synthroid daily -TSH low at 0.164 -Have decreased synthroid to . -Recommend repeat TSH in 4-6 weeks as outpatient -Have ordered free T4 on day of d/c. Would ask PCP to follow up on this   Consultants: Orthopedic Surgery Procedures performed: TOTAL HIP ARTHROPLASTY ANTERIOR APPROACH   Disposition: Home health Diet recommendation:  Cardiac diet DISCHARGE MEDICATION: Allergies as of 05/15/2023       Reactions   Noroxin [norfloxacin] Nausea And Vomiting   Augmentin [amoxicillin-pot Clavulanate] Swelling   Gums swelling, lips numb   Rosuvastatin Other (See Comments)   Pain in back of legs   Peppermint Spirit Other (See Comments)   Causes sneezing fits        Medication List     TAKE these medications    acetaminophen 500 MG tablet Commonly known as: TYLENOL Take 1,000 mg  by mouth daily.   apixaban 5 MG Tabs tablet Commonly known as: Eliquis Take 1 tablet (5 mg total) by mouth 2 (two) times daily.   atorvastatin 20 MG tablet Commonly known as: LIPITOR Take 1 tablet (20 mg total) by mouth daily. What changed: when to take this   benazepril-hydrochlorthiazide 20-25 MG tablet Commonly known as: LOTENSIN HCT Take 1 tablet by mouth daily.   chlorhexidine 4 % external liquid Commonly known as: HIBICLENS Apply 15 mLs (1 Application  total) topically as directed for 30 doses. Use as directed daily for 5 days every other week for 6 weeks.   digoxin 0.125 MG tablet Commonly known as: LANOXIN Take 1 tablet (125 mcg total) by mouth daily.   furosemide 20 MG tablet Commonly known as: LASIX Take 1 tablet (20 mg total) by mouth daily as needed for edema or fluid.   levocetirizine 5 MG tablet Commonly known as: XYZAL Take 1 tablet (5 mg total) by mouth every evening. What changed: when to take this   levothyroxine 75 MCG tablet Commonly known as: SYNTHROID Take 1 tablet (75 mcg total) by mouth daily. Start taking on: May 16, 2023 What changed:  medication strength how much to take   methocarbamol 500 MG tablet Commonly known as: ROBAXIN Take 1 tablet (500 mg total) by mouth every 6 (six) hours as needed for muscle spasms (muscle pain).   metoprolol tartrate 50 MG tablet Commonly known as: LOPRESSOR Take 1 tablet (50 mg total) by mouth 2 (two) times daily.   mometasone 0.1 % cream Commonly known as: ELOCON Apply 1 Application topically daily. What changed:  when to take this reasons to take this   multivitamin with minerals Tabs tablet Take 1 tablet by mouth at bedtime.   mupirocin ointment 2 % Commonly known as: BACTROBAN Place 1 Application into the nose 2 (two) times daily for 60 doses. Use as directed 2 times daily for 5 days every other week for 6 weeks.   Refresh Tears 0.5 % Soln Generic drug: carboxymethylcellulose Place 2 drops into both eyes in the morning.   tacrolimus 0.1 % ointment Commonly known as: PROTOPIC Apply topically in the morning and at bedtime. What changed:  how much to take when to take this        Follow-up Information     Durene Romans, MD. Schedule an appointment as soon as possible for a visit in 2 week(s).   Specialty: Orthopedic Surgery Contact information: [address] [place] [address] [phone]         Jannifer Rodney A,  FNP Follow up in 2 week(s).   Specialty: Family Medicine Contact information: [address] [place] [address] [phone]                Discharge Exam: Ceasar Mons Weights   05/12/23 1525 05/13/23 0356 05/13/23 1103  Weight: 62.1 kg 61.8 kg 61.8 kg   General exam: Awake, laying in bed, in nad Respiratory system: Normal respiratory effort, no wheezing Cardiovascular system: regular rate, s1, s2 Gastrointestinal system: Soft, nondistended, positive BS Central nervous system: CN2-12 grossly intact, strength intact Extremities: Perfused, no clubbing Skin: Normal skin turgor, no notable skin lesions seen Psychiatry: Mood normal // no visual hallucinations   Condition at discharge: fair  The results of significant diagnostics from this hospitalization (including imaging, microbiology, ancillary and laboratory) are listed below for reference.   Imaging Studies: DG Pelvis Portable Result Date: 05/13/2023 CLINICAL DATA:  Postoperative. Status post total  left hip arthroplasty. EXAM: PORTABLE PELVIS 1-2 VIEWS COMPARISON:  Pelvis and left hip radiographs 05/12/2023 FINDINGS: There is diffuse decreased bone mineralization. Interval total left hip arthroplasty. No perihardware lucency is seen to indicate hardware failure or loosening. Expected postoperative lateral left hip subcutaneous air. Bilateral sacroiliac, right femoroacetabular, and pubic symphysis joint spaces are maintained. Minimal superior pubic symphysis degenerative spurring. Moderate right L3-4 disc space narrowing. No acute fracture or dislocation. IMPRESSION: Interval total left hip arthroplasty without evidence of hardware failure. Electronically Signed   By: Neita Garnet M.D.   On: 05/13/2023 16:00   DG HIP UNILAT WITH PELVIS 1V LEFT Result Date: 05/13/2023 CLINICAL DATA:  Anterior left hip arthroplasty. Intraoperative fluoroscopy. EXAM: DG HIP (WITH OR WITHOUT PELVIS) 1V*L* COMPARISON:  Pelvis and left hip  radiographs 05/12/2023 FINDINGS: Images were performed intraoperatively without the presence of a radiologist. Left femoral neck fracture is again noted. The patient is undergoing total left hip arthroplasty. No hardware complication is seen. Total fluoroscopy images: 9 Total fluoroscopy time: 19 seconds Total dose: Radiation Exposure Index (as provided by the fluoroscopic device): 1.74 mGy air Kerma Please see intraoperative findings for further detail. IMPRESSION: Intraoperative fluoroscopy for total left hip arthroplasty. Electronically Signed   By: Neita Garnet M.D.   On: 05/13/2023 15:59   DG C-Arm 1-60 Min-No Report Result Date: 05/13/2023 Fluoroscopy was utilized by the requesting physician.  No radiographic interpretation.   DG Knee Left Port Result Date: 05/12/2023 CLINICAL DATA:  Fall, left knee and leg pain EXAM: PORTABLE LEFT KNEE - 1-2 VIEW COMPARISON:  None Available. FINDINGS: No evidence of fracture, dislocation, or joint effusion. No evidence of arthropathy or other focal bone abnormality. Soft tissues are unremarkable. IMPRESSION: Negative. Electronically Signed   By: Charlett Nose M.D.   On: 05/12/2023 20:24   CT Head Wo Contrast Result Date: 05/12/2023 CLINICAL DATA:  Acute onset of dizziness after getting out of a car. The patient fell with left-sided injuries. EXAM: CT HEAD WITHOUT CONTRAST TECHNIQUE: Contiguous axial images were obtained from the base of the skull through the vertex without intravenous contrast. RADIATION DOSE REDUCTION: This exam was performed according to the departmental dose-optimization program which includes automated exposure control, adjustment of the mA and/or kV according to patient size and/or use of iterative reconstruction technique. COMPARISON:  MR head without contrast 04/21/2022. FINDINGS: Brain: No acute infarct, hemorrhage, or mass lesion is present. No significant white matter lesions are present. Deep brain nuclei are within normal limits. The  ventricles are of normal size. No significant extraaxial fluid collection is present. The brainstem and cerebellum are within normal limits. Midline structures are within normal limits. Vascular: No hyperdense vessel or unexpected calcification. Skull: Calvarium is intact. No focal lytic or blastic lesions are present. No significant extracranial soft tissue lesion is present. Sinuses/Orbits: The paranasal sinuses and mastoid air cells are clear. Bilateral lens replacements are noted. Globes and orbits are otherwise unremarkable. IMPRESSION: Negative CT of the head. Electronically Signed   By: Marin Roberts M.D.   On: 05/12/2023 18:56   CT Cervical Spine Wo Contrast Result Date: 05/12/2023 CLINICAL DATA:  Fall. Patient is on Eliquis. Patient became dizzy after getting out of a car. EXAM: CT CERVICAL SPINE WITHOUT CONTRAST TECHNIQUE: Multidetector CT imaging of the cervical spine was performed without intravenous contrast. Multiplanar CT image reconstructions were also generated. RADIATION DOSE REDUCTION: This exam was performed according to the departmental dose-optimization program which includes automated exposure control, adjustment of the mA and/or kV according  to patient size and/or use of iterative reconstruction technique. COMPARISON:  None Available. FINDINGS: Alignment: No significant listhesis is present. Normal cervical lordosis is present. Skull base and vertebrae: Craniocervical junction is within normal limits. The vertebral body heights are normal. No acute fractures are present. Soft tissues and spinal canal: No prevertebral fluid or swelling. No visible canal hematoma. Disc levels: Significant focal disc protrusion or stenosis is present. Uncovertebral spurring contributes to mild foraminal narrowing, left greater than right at C5-6. Upper chest: The lung apices are clear. The thoracic inlet is within normal limits. IMPRESSION: 1. No acute fracture or traumatic subluxation. 2. Mild  foraminal narrowing, left greater than right at C5-6. Electronically Signed   By: Marin Roberts M.D.   On: 05/12/2023 18:53   DG Hip Unilat With Pelvis 2-3 Views Left Result Date: 05/12/2023 CLINICAL DATA:  Left hip pain after fall. EXAM: DG HIP (WITH OR WITHOUT PELVIS) 2-3V LEFT COMPARISON:  None Available. FINDINGS: Moderately displaced proximal left femoral neck fracture is noted. IMPRESSION: Moderately displaced proximal left femoral neck fracture. Electronically Signed   By: Lupita Raider M.D.   On: 05/12/2023 16:21    Microbiology: Results for orders placed or performed during the hospital encounter of 05/12/23  Surgical PCR screen     Status: Abnormal   Collection Time: 05/12/23 11:42 PM   Specimen: Nasal Mucosa; Nasal Swab  Result Value Ref Range Status   MRSA, PCR POSITIVE (A) NEGATIVE Final    Comment: RESULT CALLED TO, READ BACK BY AND VERIFIED WITH: Bonnita Nasuti RN 05/13/2023 @ 0154 BY AB    Staphylococcus aureus POSITIVE (A) NEGATIVE Final    Comment: (NOTE) The Xpert SA Assay (FDA approved for NASAL specimens in patients 56 years of age and older), is one component of a comprehensive surveillance program. It is not intended to diagnose infection nor to guide or monitor treatment. Performed at [facility], [address], [place], [place] [address]     Labs: CBC: Recent Labs  Lab 05/12/23 [id number] 05/13/23 0445 05/14/23 0624 05/15/23 0644  WBC 9.8 12.9* 17.9* 18.7*  HGB 12.9 12.1 10.3* 9.3*  HCT 38.7 35.1* 30.4* 27.4*  MCV 86.4 85.6 86.9 87.0  PLT 225 200 176 163   Basic Metabolic Panel: Recent Labs  Lab 05/12/23 1613 05/14/23 0624 05/15/23 0644  NA 139 136 138  K 3.4* 4.1 4.2  CL 100 99 105  CO2 25 23 27   GLUCOSE 127* 176* 148*  BUN 20 30* 24*  CREATININE 1.22* 1.10* 0.70  CALCIUM 9.7 8.8* 8.5*  MG 1.8  --   --    Liver Function Tests: Recent Labs  Lab 05/15/23 0644  AST 21  ALT 13  ALKPHOS 39  BILITOT 1.0  PROT 5.8*  ALBUMIN  2.5*   CBG: Recent Labs  Lab 05/12/23 1558  GLUCAP 109*    Discharge time spent: less than 30 minutes.  Signed: Rickey Barbara, MD [facility] 05/15/2023

## 2023-05-15 NOTE — Progress Notes (Signed)
 Subjective: 2 Days Post-Op Procedure(s) (LRB): TOTAL HIP ARTHROPLASTY ANTERIOR APPROACH (Left) Patient reports pain as mild.   Patient seen in rounds with Dr. Charlann Boxer. Patient is resting in bed on exam this morning. No acute events overnight.  Patient ambulated 120 feet with PT yesterday. We will start therapy today.   Objective: Vital signs in last 24 hours: Temp:  [97.7 F (36.5 C)-98.2 F (36.8 C)] 97.7 F (36.5 C) (02/24 0358) Pulse Rate:  [75-99] 75 (02/24 0358) Resp:  [17] 17 (02/24 0358) BP: (92-120)/(53-61) 120/61 (02/24 0358) SpO2:  [97 %-98 %] 98 % (02/24 0358)  Intake/Output from previous day:  Intake/Output Summary (Last 24 hours) at 05/15/2023 0744 Last data filed at 05/14/2023 1852 Gross per 24 hour  Intake 380.33 ml  Output 550 ml  Net -169.67 ml     Intake/Output this shift: No intake/output data recorded.  Labs: Recent Labs    05/12/23 1613 05/13/23 0445 05/14/23 0624 05/15/23 0644  HGB 12.9 12.1 10.3* 9.3*   Recent Labs    05/14/23 0624 05/15/23 0644  WBC 17.9* 18.7*  RBC 3.50* 3.15*  HCT 30.4* 27.4*  PLT 176 163   Recent Labs    05/14/23 0624 05/15/23 0644  NA 136 138  K 4.1 4.2  CL 99 105  CO2 23 27  BUN 30* 24*  CREATININE 1.10* 0.70  GLUCOSE 176* 148*  CALCIUM 8.8* 8.5*   No results for input(s): "LABPT", "INR" in the last 72 hours.  Exam: General - Patient is Alert and Oriented Extremity - Neurologically intact Sensation intact distally Intact pulses distally Dorsiflexion/Plantar flexion intact Dressing - dressing C/D/I Motor Function - intact, moving foot and toes well on exam.   Past Medical History:  Diagnosis Date   Arthritis    Atrial fibrillation, persistent (HCC)    Complication of anesthesia    Dysrhythmia    A. Fib   GERD (gastroesophageal reflux disease)    Related to Hiatal Hernia   Headache    Localized in bilateral eyes   Heart murmur    TAVR 2023   Hiatal hernia    Hypertension     Hypothyroidism    PONV (postoperative nausea and vomiting)    Pre-diabetes    S/P TAVR (transcatheter aortic valve replacement) 12/14/2021   s/p TAVR with a 23mm Edwards S3UR via the TF approach by Dr. Lynnette Caffey & Dr. Leafy Ro   Severe aortic stenosis    Squamous cell carcinoma of skin 01/16/2018   in situ-right knee post (txpbx)   Thyroid disease     Assessment/Plan: 2 Days Post-Op Procedure(s) (LRB): TOTAL HIP ARTHROPLASTY ANTERIOR APPROACH (Left) Principal Problem:   Hip fracture, left (HCC) Active Problems:   Atrial fibrillation (HCC)   Long term current use of anticoagulant therapy   S/P TAVR (transcatheter aortic valve replacement)   Preoperative cardiovascular examination   S/P total left hip arthroplasty  Estimated body mass index is 23.37 kg/m as calculated from the following:   Height as of this encounter: 5' 4.02" (1.626 m).   Weight as of this encounter: 61.8 kg. Advance diet Up with therapy D/C IV fluids  DVT Prophylaxis - Aspirin Weight bearing as tolerated.  ABLA: Hgb stable at 9.3, down from 12.9 pre op BP stable today at 120/61, but has been running low  Plan is to go Home after hospital stay.   Plan for discharge when meeting goals with therapy. Follow up in the office in 2 weeks.   Rosalene Billings, PA-C Orthopedic  Surgery [phone] 05/15/2023, 7:44 AM

## 2023-05-15 NOTE — Progress Notes (Signed)
 Occupational Therapy Evaluation Patient Details Name: Autumn Moyer MRN: [id number] DOB: Oct 22, 1940 Today's Date: 05/15/2023   History of Present Illness   83 y.o. female [facility]/12/23 after falling in the yard w/ L displaced femoral neck fx. S/p L THA via anterior approach on 2/22. PMHx: vertigo, a-fib, arthritis, heart murmur s/p TAVR 2023, HTN,     Clinical Impressions Pt admitted based on the information above, and is limited by the list below. PTA pt was independent with all ADL and IADLs, including meal prep and driving. Today pt is setup to min A with ADLs. Pt ambulated to bathroom with sup for safety, and completed grooming at the sink with sup. OT educated pt on the use of sock aid, and returned demo. No follow up OT is recommended. OT to follow acutely to continue to increase functional independence.     If plan is discharge home, recommend the following:   Assistance with cooking/housework;Assist for transportation     Functional Status Assessment   Patient has had a recent decline in their functional status and demonstrates the ability to make significant improvements in function in a reasonable and predictable amount of time.     Equipment Recommendations   None recommended by OT     Recommendations for Other Services         Precautions/Restrictions   Precautions Precautions: Fall Restrictions Weight Bearing Restrictions Per Provider Order: Yes LLE Weight Bearing Per Provider Order: Weight bearing as tolerated     Mobility Bed Mobility Overal bed mobility: Needs Assistance Bed Mobility: Supine to Sit     Supine to sit: Supervision, HOB elevated, Used rails          Transfers Overall transfer level: Needs assistance Equipment used: Rolling walker (2 wheels) Transfers: Sit to/from Stand, Bed to chair/wheelchair/BSC Sit to Stand: Supervision, From elevated surface Stand pivot transfers:  (W/ RW)                Balance  Overall balance assessment: History of Falls, No apparent balance deficits (not formally assessed) Sitting-balance support: No upper extremity supported, Feet supported Sitting balance-Leahy Scale: Good     Standing balance support: During functional activity, Single extremity supported Standing balance-Leahy Scale: Fair Standing balance comment: Pt able to maintain balance during grooming task at sink, one hand on RW                           ADL either performed or assessed with clinical judgement   ADL Overall ADL's : Needs assistance/impaired Eating/Feeding: Set up;Sitting   Grooming: Set up;Standing;Brushing hair           Upper Body Dressing : Set up;Sitting   Lower Body Dressing: Minimal assistance;Sitting/lateral leans Lower Body Dressing Details (indicate cue type and reason): Pt able to doff socks seated EOB, min A to don socks, pt educated on use of sock aid, and returned Publishing rights manager: Supervision/safety;Rolling walker (2 wheels);Ambulation Toilet Transfer Details (indicate cue type and reason): Simulated in room         Functional mobility during ADLs: Supervision/safety;Rolling walker (2 wheels)       Vision Baseline Vision/History: 0 No visual deficits Vision Assessment?: No apparent visual deficits     Perception         Praxis         Pertinent Vitals/Pain Pain Assessment Pain Assessment: No/denies pain     Extremity/Trunk Assessment Upper Extremity Assessment Upper Extremity  Assessment: Overall WFL for tasks assessed   Lower Extremity Assessment Lower Extremity Assessment: Defer to PT evaluation   Cervical / Trunk Assessment Cervical / Trunk Assessment: Normal   Communication Communication Communication: No apparent difficulties   Cognition Arousal: Alert Behavior During Therapy: WFL for tasks assessed/performed Cognition: No apparent impairments                               Following commands:  Intact       Cueing  General Comments   Cueing Techniques: Verbal cues      Exercises     Shoulder Instructions      Home Living Family/patient expects to be discharged to:: Private residence Living Arrangements: Spouse/significant other Available Help at Discharge: Family;Available 24 hours/day Type of Home: House Home Access: Stairs to enter [employer] of Steps: 1 Entrance Stairs-Rails: Right;Left;Can reach both Home Layout: Two level;Full bath on main level;Able to live on main level with bedroom/bathroom     Bathroom Shower/Tub: Walk-in shower;Tub/shower unit   Bathroom Toilet: Handicapped height Bathroom Accessibility: No   Home Equipment: Pharmacist, hospital (2 wheels);Wheelchair - manual;BSC/3in1          Prior Functioning/Environment Prior Level of Function : Independent/Modified Independent;Driving             Mobility Comments: no AD      OT Problem List: Decreased strength;Decreased range of motion;Impaired balance (sitting and/or standing)   OT Treatment/Interventions: Self-care/ADL training;Therapeutic exercise;Therapeutic activities;DME and/or AE instruction;Patient/family education      OT Goals(Current goals can be found in the care plan section)   Acute Rehab OT Goals Patient Stated Goal: To go home, and be independent OT Goal Formulation: With patient Time For Goal Achievement: 05/29/23 Potential to Achieve Goals: Good   OT Frequency:  Min 1X/week    Co-evaluation              AM-PAC OT "6 Clicks" Daily Activity     Outcome Measure Help from another person eating meals?: A Little Help from another person taking care of personal grooming?: A Little Help from another person toileting, which includes using toliet, bedpan, or urinal?: A Little Help from another person bathing (including washing, rinsing, drying)?: A Little Help from another person to put on and taking off regular upper body clothing?: A  Little Help from another person to put on and taking off regular lower body clothing?: A Little 6 Click Score: 18   End of Session Equipment Utilized During Treatment: Gait belt;Rolling walker (2 wheels) Nurse Communication: Mobility status  Activity Tolerance: No increased pain Patient left: in chair;with call bell/phone within reach;with chair alarm set  OT Visit Diagnosis: Unsteadiness on feet (R26.81);Other abnormalities of gait and mobility (R26.89);History of falling (Z91.81)                Time: 1610-9604 OT Time Calculation (min): 19 min Charges:  OT General Charges $OT Visit: 1 Visit OT Evaluation $OT Eval Low Complexity: 1 Low  Ivor Messier, OT  Acute Rehabilitation Services Office [phone] Secure chat preferred   Marilynne Drivers 05/15/2023, 1:30 PM

## 2023-05-15 NOTE — Plan of Care (Signed)

## 2023-05-16 ENCOUNTER — Telehealth: Payer: Self-pay

## 2023-05-16 ENCOUNTER — Other Ambulatory Visit: Payer: Self-pay | Admitting: Thoracic Surgery (Cardiothoracic Vascular Surgery)

## 2023-05-16 ENCOUNTER — Telehealth: Payer: Self-pay | Admitting: Neurology

## 2023-05-16 DIAGNOSIS — Z8719 Personal history of other diseases of the digestive system: Secondary | ICD-10-CM

## 2023-05-16 NOTE — Transitions of Care (Post Inpatient/ED Visit) (Signed)
 05/16/2023  Name: Autumn Moyer MRN: [id number] DOB: [birth year]  Today's TOC FU Call Status: Today's TOC FU Call Status:: Successful TOC FU Call Completed TOC FU Call Complete Date: 05/16/23 Patient's Name and Date of Birth confirmed.  Transition Care Management Follow-up Telephone Call Discharge Facility: [facility]) Type of Discharge: Inpatient Admission Primary Inpatient Discharge Diagnosis:: Left Femur Fraacture How have you been since you were released from the hospital?: Same Any questions or concerns?: No  Items Reviewed: Did you receive and understand the discharge instructions provided?: Yes Medications obtained,verified, and reconciled?: Yes (Medications Reviewed) Any new allergies since your discharge?: No Dietary orders reviewed?: NA Do you have support at home?: Yes People in Home: significant other  Medications Reviewed Today: Medications Reviewed Today     Reviewed by Anthoney Harada, LPN (Licensed Practical Nurse) on 05/16/23 at 1052  Med List Status: <None>   Medication Order Taking? Sig Documenting Provider Last Dose Status Informant  acetaminophen (TYLENOL) 500 MG tablet [id number] Yes Take 1,000 mg by mouth daily. [provider] Taking Active Self, Pharmacy Records  apixaban (ELIQUIS) 5 MG TABS tablet [id number] Yes Take 1 tablet (5 mg total) by mouth 2 (two) times daily. [name], FNP Taking Active Self, Pharmacy Records  atorvastatin (LIPITOR) 20 MG tablet [id number] Yes Take 1 tablet (20 mg total) by mouth daily.  Patient taking differently: Take 20 mg by mouth at bedtime.   [name], FNP Taking Active Self, Pharmacy Records  benazepril-hydrochlorthiazide (LOTENSIN HCT) 20-25 MG tablet [id number] Yes Take 1 tablet by mouth daily. [provider] Taking Active Self, Pharmacy Records  carboxymethylcellulose (REFRESH TEARS) 0.5 % SOLN [id number] Yes Place 2 drops into both eyes in the morning. [provider] Taking  Active Self, Pharmacy Records  chlorhexidine (HIBICLENS) 4 % external liquid [id number] Yes Apply 15 mLs (1 Application total) topically as directed for 30 doses. Use as directed daily for 5 days every other week for 6 weeks. Cassandria Anger, PA-C Taking Active   digoxin (LANOXIN) 0.125 MG tablet [id number] Yes Take 1 tablet (125 mcg total) by mouth daily. [name], FNP Taking Active Self, Pharmacy Records  furosemide (LASIX) 20 MG tablet [id number] Yes Take 1 tablet (20 mg total) by mouth daily as needed for edema or fluid. [name], FNP Taking Active Self, Pharmacy Records  levocetirizine (XYZAL) 5 MG tablet [id number] Yes Take 1 tablet (5 mg total) by mouth every evening.  Patient taking differently: Take 5 mg by mouth 3 (three) times a week.   [name], FNP Taking Active Self, Pharmacy Records  levothyroxine (SYNTHROID) 75 MCG tablet [id number] Yes Take 1 tablet (75 mcg total) by mouth daily. Jerald Kief, MD Taking Active   methocarbamol (ROBAXIN) 500 MG tablet [id number] Yes Take 1 tablet (500 mg total) by mouth every 6 (six) hours as needed for muscle spasms (muscle pain). Cassandria Anger, PA-C Taking Active   metoprolol tartrate (LOPRESSOR) 50 MG tablet [id number] Yes Take 1 tablet (50 mg total) by mouth 2 (two) times daily. [name], FNP Taking Active Self, Pharmacy Records  mometasone (ELOCON) 0.1 % cream [id number] Yes Apply 1 Application topically daily.  Patient taking differently: Apply 1 Application topically daily as needed (eczema in ears).   [name], FNP Taking Active Self, Pharmacy Records  Multiple Vitamin (MULTIVITAMIN WITH MINERALS) TABS tablet [id number] Yes Take 1 tablet by mouth at bedtime. [provider] Taking Active Self, Pharmacy Records  mupirocin ointment (BACTROBAN) 2 % [id number] Yes Place 1 Application into the nose 2 (two) times daily for 60 doses. Use as directed 2 times daily for 5 days every other week for 6 weeks.  Cassandria Anger, PA-C Taking Active   tacrolimus (PROTOPIC) 0.1 % ointment [id number] Yes Apply topically in the morning and at bedtime.  Patient taking differently: Apply 1 Application topically in the morning.   [name], FNP Taking Active Self, Pharmacy Records            Home Care and Equipment/Supplies: Were Home Health Services Ordered?: Yes Name of Home Health Agency:: [facility] Has Agency set up a time to come to your home?: No Any new equipment or medical supplies ordered?: NA  Functional Questionnaire: Do you need assistance with bathing/showering or dressing?: No Do you need assistance with meal preparation?: No Do you need assistance with eating?: No Do you have difficulty maintaining continence: No Do you need assistance with getting out of bed/getting out of a chair/moving?: No Do you have difficulty managing or taking your medications?: No  Follow up appointments reviewed: PCP Follow-up appointment confirmed?: Yes Date of PCP follow-up appointment?: 06/09/23 Follow-up Provider: [facility] Follow-up appointment confirmed?: Yes Follow-Up Specialty Provider:: Dr. Durene Romans Do you need transportation to your follow-up appointment?: No Do you understand care options if your condition(s) worsen?: Yes-patient verbalized understanding    SIGNATURE Kandis Fantasia, LPN [facility] Health Advisor [place] [facility] You Are. We Are. One [facility] Direct Dial [phone]

## 2023-05-16 NOTE — Telephone Encounter (Signed)
 Called and spoke to patient regarding lab results. Patient informed labs were normal and no other recommendations at this time per Dr. Everlena Cooper. Patient verbalized understanding.

## 2023-05-16 NOTE — Telephone Encounter (Signed)
-----   Message from Cira Servant sent at 05/10/2023  6:20 AM EST ----- Labs are negative.  I have no further recommendations

## 2023-05-19 DIAGNOSIS — J309 Allergic rhinitis, unspecified: Secondary | ICD-10-CM | POA: Diagnosis not present

## 2023-05-19 DIAGNOSIS — I4891 Unspecified atrial fibrillation: Secondary | ICD-10-CM | POA: Diagnosis not present

## 2023-05-19 DIAGNOSIS — E039 Hypothyroidism, unspecified: Secondary | ICD-10-CM | POA: Diagnosis not present

## 2023-05-19 DIAGNOSIS — E663 Overweight: Secondary | ICD-10-CM | POA: Diagnosis not present

## 2023-05-19 DIAGNOSIS — Z6825 Body mass index (BMI) 25.0-25.9, adult: Secondary | ICD-10-CM | POA: Diagnosis not present

## 2023-05-19 DIAGNOSIS — Z8719 Personal history of other diseases of the digestive system: Secondary | ICD-10-CM | POA: Diagnosis not present

## 2023-05-19 DIAGNOSIS — S72002D Fracture of unspecified part of neck of left femur, subsequent encounter for closed fracture with routine healing: Secondary | ICD-10-CM | POA: Diagnosis not present

## 2023-05-19 DIAGNOSIS — K219 Gastro-esophageal reflux disease without esophagitis: Secondary | ICD-10-CM | POA: Diagnosis not present

## 2023-05-19 DIAGNOSIS — D509 Iron deficiency anemia, unspecified: Secondary | ICD-10-CM | POA: Diagnosis not present

## 2023-05-19 DIAGNOSIS — I10 Essential (primary) hypertension: Secondary | ICD-10-CM | POA: Diagnosis not present

## 2023-05-23 ENCOUNTER — Ambulatory Visit
Admission: RE | Admit: 2023-05-23 | Discharge: 2023-05-23 | Disposition: A | Discharge status: Home / Self Care | Source: Ambulatory Visit | Attending: Thoracic Surgery (Cardiothoracic Vascular Surgery) | Admitting: Thoracic Surgery (Cardiothoracic Vascular Surgery)

## 2023-05-23 DIAGNOSIS — R0602 Shortness of breath: Secondary | ICD-10-CM | POA: Diagnosis not present

## 2023-05-23 DIAGNOSIS — Z8719 Personal history of other diseases of the digestive system: Secondary | ICD-10-CM

## 2023-05-26 ENCOUNTER — Ambulatory Visit (INDEPENDENT_AMBULATORY_CARE_PROVIDER_SITE_OTHER): Payer: Self-pay | Admitting: Thoracic Surgery (Cardiothoracic Vascular Surgery)

## 2023-05-26 DIAGNOSIS — K449 Diaphragmatic hernia without obstruction or gangrene: Secondary | ICD-10-CM

## 2023-05-26 NOTE — Progress Notes (Signed)
     [address]       [name] [address]             [phone]       Patient: Home Provider: Office Consent for Telemedicine visit obtained.  Today's visit was completed via a real-time telehealth (see specific modality noted below). The patient/authorized person provided oral consent at the time of the visit to engage in a telemedicine encounter with the present provider at [facility]. The patient/authorized person was informed of the potential benefits, limitations, and risks of telemedicine. The patient/authorized person expressed understanding that the laws that protect confidentiality also apply to telemedicine. The patient/authorized person acknowledged understanding that telemedicine does not provide emergency services and that he or she would need to call 911 or proceed to the nearest hospital for help if such a need arose.   Total time spent in the clinical discussion 10 minutes.  Telehealth Modality: Phone visit (audio only)  I had a telephone visit with Autumn Moyer.  She broke her hip 2 weeks ago and had it replaced.  She denies any reflux or dysphagia.  Her breathing is improved as well.  She will call as needed.

## 2023-06-09 ENCOUNTER — Ambulatory Visit (INDEPENDENT_AMBULATORY_CARE_PROVIDER_SITE_OTHER)

## 2023-06-09 ENCOUNTER — Encounter: Payer: Self-pay | Admitting: Family

## 2023-06-09 ENCOUNTER — Ambulatory Visit: Payer: PPO | Admitting: Family

## 2023-06-09 VITALS — BP 130/75 | HR 72 | Temp 97.0°F | Ht 64.0 in | Wt 137.2 lb

## 2023-06-09 DIAGNOSIS — I4891 Unspecified atrial fibrillation: Secondary | ICD-10-CM

## 2023-06-09 DIAGNOSIS — K2100 Gastro-esophageal reflux disease with esophagitis, without bleeding: Secondary | ICD-10-CM | POA: Diagnosis not present

## 2023-06-09 DIAGNOSIS — E663 Overweight: Secondary | ICD-10-CM

## 2023-06-09 DIAGNOSIS — E039 Hypothyroidism, unspecified: Secondary | ICD-10-CM | POA: Diagnosis not present

## 2023-06-09 DIAGNOSIS — I10 Essential (primary) hypertension: Secondary | ICD-10-CM | POA: Diagnosis not present

## 2023-06-09 DIAGNOSIS — Z780 Asymptomatic menopausal state: Secondary | ICD-10-CM | POA: Diagnosis not present

## 2023-06-09 DIAGNOSIS — Z7901 Long term (current) use of anticoagulants: Secondary | ICD-10-CM

## 2023-06-09 DIAGNOSIS — D509 Iron deficiency anemia, unspecified: Secondary | ICD-10-CM | POA: Diagnosis not present

## 2023-06-09 MED ORDER — FUROSEMIDE 20 MG PO TABS: 20.0000 mg | ORAL_TABLET | Freq: Every day | ORAL | 1 refills | Status: AC | PRN

## 2023-06-09 MED ORDER — APIXABAN 5 MG PO TABS: 5.0000 mg | ORAL_TABLET | Freq: Two times a day (BID) | ORAL | 1 refills | Status: DC

## 2023-06-09 MED ORDER — LEVOTHYROXINE SODIUM 75 MCG PO TABS: 75.0000 ug | ORAL_TABLET | Freq: Every day | ORAL | 1 refills | Status: DC

## 2023-06-09 NOTE — Progress Notes (Addendum)
 Subjective:    Patient ID: Autumn Moyer, female    DOB: [birth year], 83 y.o.   MRN: [id number]  Chief Complaint  Patient presents with   Medical Management of Chronic Issues    Broke left hip 4 weeks ago. Change thyroid in hospital    Pt presents to the offic today for CPE and chronic follow up. She is followed by Cardiologists  for A Fib, Aortic stenosis and gets an Echo each year. She is currently on Eliquis 5 mg BID.    She had aortic valve replacement on 12/14/21.  She fell 05/12/23 and broke left hip.   Hypertension This is a chronic problem. The current episode started more than 1 year ago. The problem has been resolved since onset. The problem is controlled. Associated symptoms include malaise/fatigue (some days) and peripheral edema (left leg). Pertinent negatives include no shortness of breath. Risk factors for coronary artery disease include dyslipidemia, obesity and sedentary lifestyle. The current treatment provides moderate improvement. Identifiable causes of hypertension include a thyroid problem.  Gastroesophageal Reflux She complains of belching, heartburn and a hoarse voice. This is a chronic problem. The current episode started more than 1 year ago. The problem occurs occasionally. Associated symptoms include fatigue. Risk factors include obesity. She has tried a PPI for the symptoms. The treatment provided moderate relief.  Thyroid Problem Presents for follow-up visit. Symptoms include dry skin, fatigue and hoarse voice. Patient reports no constipation or depressed mood. The symptoms have been stable.  Anemia Presents for follow-up visit. Symptoms include malaise/fatigue (some days).      Review of Systems  Constitutional:  Positive for fatigue and malaise/fatigue (some days).  HENT:  Positive for hoarse voice.   Respiratory:  Negative for shortness of breath.   Gastrointestinal:  Positive for heartburn. Negative for constipation.  All other systems reviewed and  are negative.      Objective:   Physical Exam Vitals reviewed.  Constitutional:      General: She is not in acute distress.    Appearance: She is well-developed.  HENT:     Head: Normocephalic and atraumatic.     Right Ear: Tympanic membrane normal.     Left Ear: Tympanic membrane normal.  Eyes:     Pupils: Pupils are equal, round, and reactive to light.  Neck:     Thyroid: No thyromegaly.  Cardiovascular:     Rate and Rhythm: Normal rate. Rhythm irregular.     Heart sounds: Normal heart sounds. No murmur heard. Pulmonary:     Effort: Pulmonary effort is normal. No respiratory distress.     Breath sounds: Normal breath sounds. No wheezing.  Abdominal:     General: Bowel sounds are normal. There is no distension.     Palpations: Abdomen is soft.     Tenderness: There is no abdominal tenderness.  Musculoskeletal:        General: No tenderness. Normal range of motion.     Cervical back: Normal range of motion and neck supple.  Skin:    General: Skin is warm and dry.  Neurological:     Mental Status: She is alert and oriented to person, place, and time.     Cranial Nerves: No cranial nerve deficit.     Deep Tendon Reflexes: Reflexes are normal and symmetric.  Psychiatric:        Behavior: Behavior normal.        Thought Content: Thought content normal.        Judgment:  Judgment normal.       BP 130/75   Pulse 72   Temp (!) 97 F (36.1 C) (Temporal)   Ht 5\' 4"  (1.626 m)   Wt 137 lb 3.2 oz (62.2 kg)   SpO2 100%   BMI 23.55 kg/m      Assessment & Plan:  Autumn Moyer comes in today with chief complaint of Medical Management of Chronic Issues (Broke left hip 4 weeks ago. Change thyroid in hospital )   Diagnosis and orders addressed:  1. Atrial fibrillation, unspecified type (HCC) - apixaban (ELIQUIS) 5 MG TABS tablet; Take 1 tablet (5 mg total) by mouth 2 (two) times daily.  Dispense: 180 tablet; Refill: 1 - CBC with Differential/Platelet - CMP14+EGFR  2.  Essential hypertension - furosemide (LASIX) 20 MG tablet; Take 1 tablet (20 mg total) by mouth daily as needed for edema or fluid.  Dispense: 90 tablet; Refill: 1 - CBC with Differential/Platelet - CMP14+EGFR  3. Gastroesophageal reflux disease with esophagitis, unspecified whether hemorrhage  - CBC with Differential/Platelet - CMP14+EGFR  4. Essential hypertension, benign - CBC with Differential/Platelet - CMP14+EGFR  5. Hypothyroidism, unspecified type (Primary) - levothyroxine (SYNTHROID) 75 MCG tablet; Take 1 tablet (75 mcg total) by mouth daily.  Dispense: 90 tablet; Refill: 1 - CBC with Differential/Platelet - CMP14+EGFR - TSH  6. Iron deficiency anemia, unspecified iron deficiency anemia type - CBC with Differential/Platelet - CMP14+EGFR  7. Long term current use of anticoagulant therapy - CBC with Differential/Platelet - CMP14+EGFR  8. Overweight (BMI 25.0-29.9) - CBC with Differential/Platelet - CMP14+EGFR  9. Post-menopausal - DG [facility] DEXA; Future - CBC with Differential/Platelet - CMP14+EGFR   Labs pending Continue current medications  Continue current medications  Health Maintenance reviewed Diet and exercise encouraged  Follow up plan: 6 months   Jannifer Rodney, FNP

## 2023-06-09 NOTE — Patient Instructions (Signed)
 Preventing Osteoporosis, Adult Osteoporosis is a condition that causes the bones to lose density. This means that the bones become thinner, and the normal spaces in bone tissue become larger. Low bone density can make the bones weak and cause them to break more easily. Osteoporosis cannot always be prevented, but you can take steps to lower your risk of developing this condition. How can this condition affect me? If you develop osteoporosis, you will be more likely to break bones in your wrist, spine, or hip. Even a minor accident or injury can be enough to break weak bones. The bones will also be slower to heal. Osteoporosis can cause other problems as well, such as a stooped posture or trouble with movement. Osteoporosis can occur with aging. As you get older, you may lose bone tissue more quickly, or it may be replaced more slowly. Osteoporosis is more likely to develop if you have poor nutrition or do not get enough calcium or vitamin D. Other lifestyle factors can also play a role. By eating a well-balanced diet and making lifestyle changes, you can help keep your bones strong and healthy, lowering your chances of developing osteoporosis. What can increase my risk? The following factors may make you more likely to develop osteoporosis: Having a family history of the condition. Having poor nutrition or not getting enough calcium or vitamin D. Using certain medicines, such as steroid medicines or anti-seizure medicines. Being any of the following: 59 years of age or older. Female. A woman who has gone through menopause (is postmenopausal). A person who is of European or Asian descent. Using products that contain nicotine or tobacco, such as cigarettes, e-cigarettes, and chewing tobacco. Not being physically active (being sedentary). Having a small body frame. What actions can I take to prevent this? Get enough calcium  Make sure you get enough calcium every day. Calcium is the most important  mineral for bone health. Most people can get enough calcium from their diet, but supplements may be recommended for people who are at risk for osteoporosis. Follow these guidelines: If you are age 31 or younger, aim to get 1,000 milligrams (mg) of calcium every day. If you are older than age 52, aim to get 1,200 mg of calcium every day. Good sources of calcium include: Dairy products, such as low-fat or nonfat milk, cheese, and yogurt. Dark green leafy vegetables, such as bok choy and broccoli. Foods that have had calcium added to them (calcium-fortified foods), such as orange juice, cereal, bread, soy beverages, and tofu products. Nuts, such as almonds. Check nutrition labels to see how much calcium is in a food or drink. Get enough vitamin D Try to get enough vitamin D every day. Vitamin D is the most essential vitamin for bone health. It helps the body absorb calcium. Follow these guidelines for how much vitamin D to get from food: If you are age 36 or younger, aim to get at least 600 international units (IU) every day. Your health care provider may suggest more. If you are older than age 67, aim to get at least 800 international units every day. Your health care provider may suggest more. Good sources of vitamin D in your diet include: Egg yolks. Oily fish, such as salmon, sardines, and tuna. Milk and cereal fortified with vitamin D. Your body also makes vitamin D when you are out in the sun. Exposing the bare skin on your face, arms, legs, or back to the sun for no more than 30 minutes a  day, 2 times a week is more than enough. Beyond that, make sure you use sunblock to protect your skin from sunburn, which increases your risk for skin cancer. Exercise  Stay active and get exercise every day. Ask your health care provider what types of exercise are best for you. Weight-bearing and strength-building activities are important for building and maintaining healthy bones. Some examples of these  types of activities include: Walking and hiking. Jogging and running. Dancing. Gym exercises and lifting weights. Tennis and racquetball. Climbing stairs. Tai chi. Make other lifestyle changes Do not use any products that contain nicotine or tobacco, such as cigarettes, e-cigarettes, and chewing tobacco. If you need help quitting, ask your health care provider. Lose weight if you are overweight. If you drink alcohol: Limit how much you use to: 0-1 drink a day for women who are not pregnant. 0-2 drinks a day for men. Be aware of how much alcohol is in your drink. In the [place] one drink equals one 12 oz bottle of beer (355 mL), one 5 oz glass of wine (148 mL), or one 1 oz glass of hard liquor (44 mL). Where to find support If you need help making changes to prevent osteoporosis, talk with your health care provider. You can ask for a referral to a dietitian and a physical therapist. Where to find more information Learn more about osteoporosis from: NIH Osteoporosis and Related Bone Diseases National Resource Center: www.bones.http://www.myers.net/ [place] Office on [employer] Health: http://hoffman.com/ National Osteoporosis Foundation: [employer] Summary Osteoporosis is a condition that causes weak bones that are more likely to break. Eat a healthy diet, making sure you get enough calcium and vitamin D, and stay active by getting regular exercise to help prevent osteoporosis. Other ways to reduce your risk of osteoporosis include maintaining a healthy weight and avoiding alcohol and products that contain nicotine or tobacco. This information is not intended to replace advice given to you by your health care provider. Make sure you discuss any questions you have with your health care provider. Document Revised: 11/08/2022 Document Reviewed: 11/08/2022 Elsevier Patient Education  2024 [employer].

## 2023-06-10 LAB — CBC WITH DIFFERENTIAL/PLATELET
Basophils Absolute: 0.1 10*3/uL (ref 0.0–0.2)
Basos: 1 %
EOS (ABSOLUTE): 0.2 10*3/uL (ref 0.0–0.4)
Eos: 2 %
Hematocrit: 34.3 % (ref 34.0–46.6)
Hemoglobin: 11.1 g/dL (ref 11.1–15.9)
Immature Grans (Abs): 0.1 10*3/uL (ref 0.0–0.1)
Immature Granulocytes: 1 %
Lymphocytes Absolute: 1.9 10*3/uL (ref 0.7–3.1)
Lymphs: 24 %
MCH: 30.3 pg (ref 26.6–33.0)
MCHC: 32.4 g/dL (ref 31.5–35.7)
MCV: 94 fL (ref 79–97)
Monocytes Absolute: 0.8 10*3/uL (ref 0.1–0.9)
Monocytes: 9 %
Neutrophils Absolute: 5.2 10*3/uL (ref 1.4–7.0)
Neutrophils: 63 %
Platelets: 310 10*3/uL (ref 150–450)
RBC: 3.66 x10E6/uL — ABNORMAL LOW (ref 3.77–5.28)
RDW: 15 % (ref 11.7–15.4)
WBC: 8.2 10*3/uL (ref 3.4–10.8)

## 2023-06-10 LAB — CMP14+EGFR
ALT: 10 IU/L (ref 0–32)
AST: 19 IU/L (ref 0–40)
Albumin: 4.2 g/dL (ref 3.8–4.8)
Alkaline Phosphatase: 104 IU/L (ref 44–121)
BUN/Creatinine Ratio: 16 (ref 12–28)
BUN: 14 mg/dL (ref 8–27)
Bilirubin Total: 1 mg/dL (ref 0.0–1.2)
CO2: 23 mmol/L (ref 20–29)
Calcium: 9.7 mg/dL (ref 8.7–10.3)
Chloride: 100 mmol/L (ref 96–106)
Creatinine, Ser: 0.89 mg/dL (ref 0.57–1.00)
Globulin, Total: 2.9 g/dL (ref 1.5–4.5)
Glucose: 103 mg/dL — ABNORMAL HIGH (ref 70–99)
Potassium: 4.4 mmol/L (ref 3.5–5.2)
Sodium: 140 mmol/L (ref 134–144)
Total Protein: 7.1 g/dL (ref 6.0–8.5)
eGFR: 65 mL/min/{1.73_m2} (ref >59)

## 2023-06-10 LAB — TSH: TSH: 16.5 u[IU]/mL — ABNORMAL HIGH (ref 0.450–4.500)

## 2023-06-11 NOTE — Progress Notes (Unsigned)
  Cardiology Office Note:   Date:  06/11/2023  ID:  Autumn Moyer, DOB [birth year], MRN [id number] PCP: [name], FNP  [facility] Providers Cardiologist:  Rollene Rotunda, MD {  History of Present Illness:   Autumn Moyer is a 82 y.o. female who presents for evaluation of aortic stenosis and new onset atrial fibrillation. Her AS was moderate in August in 2020.  She had SOB.  Lexiscan Myoview was negative for ischemia.      She had worsening aortic stenosis over time and is status post TAVR.  She had an Edwards SAPIEN 3 via the transfemoral approach.  She has had hiatal hernia repair and is breathing much better between the TAVR and this.  She now has had to recover from a broken hip after a mechanical fall. The patient denies any new symptoms such as chest discomfort, neck or arm discomfort. There has been no new shortness of breath, PND or orthopnea. There have been no reported palpitations, presyncope or syncope.    ROS: As stated in the HPI and negative for all other systems.  Studies Reviewed:    EKG:   NA  Risk Assessment/Calculations:    CHA2DS2-VASc Score = 4   This indicates a 4.8% annual risk of stroke. The patient's score is based upon: CHF History: 0 HTN History: 1 Diabetes History: 0 Stroke History: 0 Vascular Disease History: 0 Age Score: 2 Gender Score: 1   Physical Exam:   VS:  There were no vitals taken for this visit.   Wt Readings from Last 3 Encounters:  06/09/23 137 lb 3.2 oz (62.2 kg)  05/13/23 136 lb 3.9 oz (61.8 kg)  05/04/23 135 lb (61.2 kg)     GEN: Well nourished, well developed in no acute distress NECK: No JVD; No carotid bruits CARDIAC: Irregular RR, very brief apical systolic murmur nonradiating, no diastolic murmurs, rubs, gallops RESPIRATORY:  Clear to auscultation without rales, wheezing or rhonchi  ABDOMEN: Soft, non-tender, non-distended EXTREMITIES:  No edema; No deformity   ASSESSMENT AND PLAN:   AS:   The patient is  doing well status post TAVR.  No change in therapy.   HTN: Her blood pressure is at target.  No change in therapy.   ELEVATED TRIGLYCERIDES: I given her written orders to get a lipid profile in 3 months.   ATRIAL FIB:    She tolerates anticoagulation.  No change in therapy.  She has had good rate control and a reasonable digoxin level.   Follow up with me in one year.   Signed, Rollene Rotunda, MD

## 2023-06-12 ENCOUNTER — Other Ambulatory Visit: Payer: Self-pay | Admitting: Family

## 2023-06-12 MED ORDER — LEVOTHYROXINE SODIUM 88 MCG PO TABS: 88.0000 ug | ORAL_TABLET | Freq: Every day | ORAL | 2 refills | Status: DC

## 2023-06-13 DIAGNOSIS — M810 Age-related osteoporosis without current pathological fracture: Secondary | ICD-10-CM | POA: Diagnosis not present

## 2023-06-13 DIAGNOSIS — Z780 Asymptomatic menopausal state: Secondary | ICD-10-CM | POA: Diagnosis not present

## 2023-06-14 ENCOUNTER — Other Ambulatory Visit: Payer: Self-pay | Admitting: *Deleted

## 2023-06-14 ENCOUNTER — Ambulatory Visit: Payer: PPO | Admitting: Cardiology

## 2023-06-14 ENCOUNTER — Encounter: Payer: Self-pay | Admitting: Cardiology

## 2023-06-14 VITALS — BP 130/84 | HR 72 | Ht 64.0 in | Wt 135.0 lb

## 2023-06-14 DIAGNOSIS — I4811 Longstanding persistent atrial fibrillation: Secondary | ICD-10-CM | POA: Diagnosis not present

## 2023-06-14 DIAGNOSIS — E781 Pure hyperglyceridemia: Secondary | ICD-10-CM

## 2023-06-14 DIAGNOSIS — Z79899 Other long term (current) drug therapy: Secondary | ICD-10-CM | POA: Diagnosis not present

## 2023-06-14 DIAGNOSIS — Z953 Presence of xenogenic heart valve: Secondary | ICD-10-CM

## 2023-06-14 DIAGNOSIS — I10 Essential (primary) hypertension: Secondary | ICD-10-CM | POA: Diagnosis not present

## 2023-06-14 NOTE — Patient Instructions (Addendum)
 Medication Instructions:  The current medical regimen is effective;  continue present plan and medications.  *If you need a refill on your cardiac medications before your next appointment, please call your pharmacy*  Lab: Please have fasting lab in 3 months at your closest [facility] (Lipid)  Follow-Up: At [facility], you and your health needs are our priority.  As part of our continuing mission to provide you with exceptional heart care, we have created designated Provider Care Teams.  These Care Teams include your primary Cardiologist (physician) and Advanced Practice Providers (APPs -  Physician Assistants and Nurse Practitioners) who all work together to provide you with the care you need, when you need it.  We recommend signing up for the patient portal called "MyChart".  Sign up information is provided on this After Visit Summary.  MyChart is used to connect with patients for Virtual Visits (Telemedicine).  Patients are able to view lab/test results, encounter notes, upcoming appointments, etc.  Non-urgent messages can be sent to your provider as well.   To learn more about what you can do with MyChart, go to [email or web address].    Your next appointment:   1 year(s)  Provider:   Rollene Rotunda, MD

## 2023-06-15 ENCOUNTER — Other Ambulatory Visit: Payer: Self-pay | Admitting: Family

## 2023-06-15 ENCOUNTER — Telehealth: Payer: Self-pay

## 2023-06-15 DIAGNOSIS — M810 Age-related osteoporosis without current pathological fracture: Secondary | ICD-10-CM

## 2023-06-15 MED ORDER — ALENDRONATE SODIUM 70 MG PO TABS: 70.0000 mg | ORAL_TABLET | ORAL | 1 refills | Status: DC

## 2023-06-15 NOTE — Telephone Encounter (Signed)
 Refer to the labs

## 2023-06-15 NOTE — Telephone Encounter (Signed)
 Copied from CRM [id number]. Topic: Appointments - Scheduling Inquiry for Clinic >> Jun 15, 2023  4:39 PM Higinio Roger wrote: Reason for CRM: Patient needs to follow up with Raynelle Fanning for education per Jannifer Rodney FNP note:  You have osteoporosis. Please take daily Calcium 1000mg , and Vitamin D3 2000-5000 IU to protect your bones. Doing weightbearing exercise like walking is good for you bones. I would like for you to start Fosamax weekly. Needs follow up with Raynelle Fanning for education. Please have the DEXA rechecked in 2 years.  Patient callback #: [phone]

## 2023-06-16 ENCOUNTER — Ambulatory Visit: Payer: PPO | Admitting: Family

## 2023-06-20 ENCOUNTER — Other Ambulatory Visit: Payer: Self-pay | Admitting: Family Medicine

## 2023-06-20 DIAGNOSIS — M810 Age-related osteoporosis without current pathological fracture: Secondary | ICD-10-CM

## 2023-06-26 ENCOUNTER — Telehealth: Payer: Self-pay

## 2023-06-26 NOTE — Progress Notes (Signed)
 Care Guide Pharmacy Note  06/26/2023 Name: [name] MRN: [id number] DOB: [birth year]  Referred By: [name], FNP Reason for referral: Complex Care Management (Outreach to schedule with Pharm d )   Autumn Moyer is a 83 y.o. year old female who is a primary care patient of [name], FNP.  MARGIA WIESEN was referred to the pharmacist for assistance related to:  osteoporosis   Successful contact was made with the patient to discuss pharmacy services including being ready for the pharmacist to call at least 5 minutes before the scheduled appointment time and to have medication bottles and any blood pressure readings ready for review. The patient agreed to meet with the pharmacist via telephone visit on (date/time).08/01/2023  Penne Lash , RMA     [place]  [facility] Guide  Direct Dial: [phone]  Website: [email or web address]

## 2023-06-29 ENCOUNTER — Ambulatory Visit: Admitting: Family Medicine

## 2023-06-29 ENCOUNTER — Encounter: Payer: Self-pay | Admitting: Family Medicine

## 2023-06-29 ENCOUNTER — Ambulatory Visit: Payer: Self-pay | Admitting: Family

## 2023-06-29 ENCOUNTER — Telehealth: Admitting: Family Medicine

## 2023-06-29 DIAGNOSIS — Z79899 Other long term (current) drug therapy: Secondary | ICD-10-CM | POA: Diagnosis not present

## 2023-06-29 DIAGNOSIS — U071 COVID-19: Secondary | ICD-10-CM | POA: Diagnosis not present

## 2023-06-29 MED ORDER — MOLNUPIRAVIR EUA 200MG CAPSULE: 4.0000 | ORAL_CAPSULE | Freq: Two times a day (BID) | ORAL | 0 refills | Status: AC

## 2023-06-29 NOTE — Progress Notes (Signed)
 Virtual Visit via Video   I connected with patient on 06/29/23 at 1100 by a video enabled telemedicine application and verified that I am speaking with the correct person using two identifiers.  Location patient: Home Location provider: [facility] Persons participating in the virtual visit: Patient and Provider  I discussed the limitations of evaluation and management by telemedicine and the availability of in person appointments. The patient expressed understanding and agreed to proceed.  Subjective:   HPI:  Pt presents today for  Chief Complaint  Patient presents with   Covid Positive    History of Present Illness   Autumn Moyer is an 83 year old Autumn who presents with symptoms of COVID-19.  She has been experiencing nasal congestion and joint aches since yesterday morning. She felt chilled while at the grocery store, prompting her to take a COVID-19 test, which was positive. She also has a mild dry cough and nasal congestion, causing slight difficulty in breathing. No chest pain or significant shortness of breath is noted.  She initially had a low-grade fever of 55F, which increased to over 100F. She managed the fever with Tylenol.  She has a history of taking molnupiravir for COVID-19 in the past due to contraindications with her cardiac medications, including digoxin and statin therapy, which prevent the use of Paxlovid.       ROS per HPI   Patient Active Problem List   Diagnosis Date Noted   Preoperative cardiovascular examination 05/13/2023   S/P total left hip arthroplasty 05/13/2023   Hip fracture, left (HCC) 05/12/2023   Paraesophageal hernia 01/09/2023   S/P TAVR (transcatheter aortic valve replacement) 12/14/2021   Long term current use of anticoagulant therapy 09/28/2021   Caries 09/28/2021   Torus palatinus 09/28/2021   Hiatal hernia 07/17/2020   Atrial fibrillation (HCC) 02/10/2020   Severe aortic stenosis  10/09/2018   Prediabetes 06/29/2016   Overweight (BMI 25.0-29.9) 11/27/2015   Allergic rhinitis 05/22/2015   Iron deficiency anemia 05/22/2015   Essential hypertension, benign 11/15/2013   Esophageal reflux 11/15/2013   Hypothyroid 06/11/2010    Social History   Tobacco Use   Smoking status: Never    Passive exposure: Past   Smokeless tobacco: Never  Substance Use Topics   Alcohol use: No    Alcohol/week: 0.0 standard drinks of alcohol    Current Outpatient Medications:    alendronate (FOSAMAX) 70 MG tablet, Take 1 tablet (70 mg total) by mouth once a week. Take with a full glass of water on an empty stomach., Disp: 12 tablet, Rfl: 1   molnupiravir EUA (LAGEVRIO) 200 mg CAPS capsule, Take 4 capsules (800 mg total) by mouth 2 (two) times daily for 5 days., Disp: 40 capsule, Rfl: 0   acetaminophen (TYLENOL) 500 MG tablet, Take 1,000 mg by mouth daily., Disp: , Rfl:    apixaban (ELIQUIS) 5 MG TABS tablet, Take 1 tablet (5 mg total) by mouth 2 (two) times daily., Disp: 180 tablet, Rfl: 1   atorvastatin (LIPITOR) 20 MG tablet, Take 1 tablet (20 mg total) by mouth daily. (Patient not taking: Reported on 06/09/2023), Disp: 90 tablet, Rfl: 3   benazepril-hydrochlorthiazide (LOTENSIN HCT) 20-25 MG tablet, Take 1 tablet by mouth daily., Disp: , Rfl:    carboxymethylcellulose (REFRESH TEARS) 0.5 % SOLN, Place 2 drops into both eyes in the morning., Disp: , Rfl:    chlorhexidine (HIBICLENS) 4 % external liquid, Apply 15 mLs (1 Application total) topically as directed for 30  doses. Use as directed daily for 5 days every other week for 6 weeks., Disp: 946 mL, Rfl: 1   digoxin (LANOXIN) 0.125 MG tablet, Take 1 tablet (125 mcg total) by mouth daily., Disp: 90 tablet, Rfl: 3   furosemide (LASIX) 20 MG tablet, Take 1 tablet (20 mg total) by mouth daily as needed for edema or fluid., Disp: 90 tablet, Rfl: 1   levocetirizine (XYZAL) 5 MG tablet, Take 1 tablet (5 mg total) by mouth every evening. (Patient  taking differently: Take 5 mg by mouth 3 (three) times a week.), Disp: 90 tablet, Rfl: 1   levothyroxine (SYNTHROID) 88 MCG tablet, Take 1 tablet (88 mcg total) by mouth daily before breakfast., Disp: 90 tablet, Rfl: 2   methocarbamol (ROBAXIN) 500 MG tablet, Take 1 tablet (500 mg total) by mouth every 6 (six) hours as needed for muscle spasms (muscle pain)., Disp: 30 tablet, Rfl: 0   metoprolol tartrate (LOPRESSOR) 50 MG tablet, Take 1 tablet (50 mg total) by mouth 2 (two) times daily., Disp: 180 tablet, Rfl: 3   mometasone (ELOCON) 0.1 % cream, Apply 1 Application topically daily. (Patient taking differently: Apply 1 Application topically daily as needed (eczema in ears).), Disp: 45 g, Rfl: 2   Multiple Vitamin (MULTIVITAMIN WITH MINERALS) TABS tablet, Take 1 tablet by mouth at bedtime., Disp: , Rfl:    tacrolimus (PROTOPIC) 0.1 % ointment, Apply topically in the morning and at bedtime. (Patient taking differently: Apply 1 Application topically in the morning.), Disp: 30 g, Rfl: 4  Allergies  Allergen Reactions   Noroxin [Norfloxacin] Nausea And Vomiting   Augmentin [Amoxicillin-Pot Clavulanate] Swelling    Gums swelling, lips numb   Rosuvastatin Other (See Comments)    Pain in back of legs   Peppermint Spirit Other (See Comments)    Causes sneezing fits    Objective:   There were no vitals taken for this visit.  Patient is well-developed, well-nourished in no acute distress.  Resting comfortably at home.  Head is normocephalic, atraumatic.  No labored breathing.  Speech is clear and coherent with logical content.  Patient is alert and oriented at baseline.   Assessment and Plan:   [name] was seen today for covid positive.  Diagnoses and all orders for this visit:  COVID-19 virus detected -     molnupiravir EUA (LAGEVRIO) 200 mg CAPS capsule; Take 4 capsules (800 mg total) by mouth 2 (two) times daily for 5 days.  High risk medication use -     molnupiravir EUA (LAGEVRIO) 200  mg CAPS capsule; Take 4 capsules (800 mg total) by mouth 2 (two) times daily for 5 days.    Assessment and Plan    COVID-19 She tested positive for COVID-19 with symptoms of nasal congestion, arthralgia, chills, fever up to 100F, and a dry cough. She has been treated with molnupiravir previously due to potential drug interactions with her cardiac medications, including digoxin and statins. Her GFR is 65, allowing for antiviral treatment. Given her cardiac history and current medications, molnupiravir is preferred over Paxlovid to avoid interactions with her cardiac and lipid-lowering medications. - Prescribe molnupiravir for COVID-19 treatment. [employer] insurance claim indicating high-risk medications to ensure coverage for molnupiravir. - Advise her to monitor symptoms and report any worsening or new symptoms.        Return if symptoms worsen or fail to improve.  Kari Baars, FNP-C [facility] [address] [place], [place] [address] [phone]  06/29/2023  Time  spent with the patient: 12 minutes, of which >50% was spent in obtaining information about symptoms, reviewing previous labs, evaluations, and treatments, counseling about condition (please see the discussed topics above), and developing a plan to further investigate it; had a number of questions which I addressed.

## 2023-06-29 NOTE — Telephone Encounter (Signed)
 Copied from CRM [id number]. Topic: Clinical - Medical Advice >> Jun 29, 2023  9:00 AM Autumn Moyer wrote: Reason for CRM: Patient took an at home covid test on 06/28/23 and it came back positive. Patient wants to know if Autumn Moyer can prescribe some medicine. Patient can be contacted by phone at [phone].  Preferred Pharmacy [facility] [address], [place] - [address] [place] [address] Phone: [phone] Fax: [phone]   Chief Complaint: covid positive Symptoms: body aches, nasal congestion, and intermittent fever Frequency: constant Pertinent Negatives: Patient denies shortness of breath Disposition: [] ED /[] Urgent Care (no appt availability in office) / [] Appointment(In office/virtual)/ []  [facility] M[facility]/ [] Home Care/ [] Refused Recommended Disposition /[] [facility]/ [x]  Follow-up with PCP Additional Notes: Patient reports symptoms started yesterday states that she woke up with body aches then later started running a fever. State that she took a home test and it was positive. Pt now requesting antiviral for treatment. Pt sts that she was prescribed antiviral in the past for Covid before. Per chart review, pt was prescribed Molnupravir 200mg  in 07/2020.Pt doesn't want to scheduled visit at this time.  Reason for Disposition  [1] COVID-19 diagnosed by positive lab test (e.g., PCR, rapid self-test kit) AND [2] mild symptoms (e.g., cough, fever, others) AND [3] no complications or SOB  Answer Assessment - Initial Assessment Questions 1. COVID-19 DIAGNOSIS: "How do you know that you have COVID?" (e.g., positive lab test or self-test, diagnosed by doctor or NP/PA, symptoms after exposure).     Home test on 06/28/23  2. COVID-19 EXPOSURE: "Was there any known exposure to COVID before the symptoms began?" CDC Definition of close contact: within 6 feet (2 meters) for a total of 15 minutes or more over a 24-hour period.   3. ONSET: "When  did the COVID-19 symptoms start?"      Symptoms started yesterday. States that she woke up achy and running a fever in the afternoon. States that she took a covid test and was positive  4. WORST SYMPTOM: "What is your worst symptom?" (e.g., cough, fever, shortness of breath, muscle aches)     Nasal congestion  5. COUGH: "Do you have a cough?" If Yes, ask: "How bad is the cough?"       Yes, a hacking cough  6. FEVER: "Do you have a fever?" If Yes, ask: "What is your temperature, how was it measured, and when did it start?"     Low grade, but has been taking Tylenol  7. RESPIRATORY STATUS: "Describe your breathing?" (e.g., normal; shortness of breath, wheezing, unable to speak)      Normal  8. BETTER-SAME-WORSE: "Are you getting better, staying the same or getting worse compared to yesterday?"  If getting worse, ask, "In what way?"     Getting better with Tylenol  9. OTHER SYMPTOMS: "Do you have any other symptoms?"  (e.g., chills, fatigue, headache, loss of smell or taste, muscle pain, sore throat)     Chills, fever, body aches  10. HIGH RISK DISEASE: "Do you have any chronic medical problems?" (e.g., asthma, heart or lung disease, weak immune system, obesity, etc.)       . 11. VACCINE: "Have you had the COVID-19 vaccine?" If Yes, ask: "Which one, how many shots, when did you get it?"       Had the 1st four, 2 shots and 2 booster  12. PREGNANCY: "Is there any chance you are pregnant?" "When was  your last menstrual period?"       no 13. O2 SATURATION MONITOR:  "Do you use an oxygen saturation monitor (pulse oximeter) at home?" If Yes, ask "What is your reading (oxygen level) today?" "What is your usual oxygen saturation reading?" (e.g., 95%)       No  Protocols used: Coronavirus (COVID-19) Diagnosed or Suspected-A-AH

## 2023-07-01 NOTE — Progress Notes (Unsigned)
   Acute Office Visit  Subjective:     Patient ID: Autumn Moyer, female    DOB: 1940/06/08, 83 y.o.   MRN: [id number]  No chief complaint on file.   HPI  ROS Negative unless indicated in HPI    Objective:    There were no vitals taken for this visit. {Vitals History (Optional):23777}  Physical Exam  No results found for any visits on 07/03/23.      Assessment & Plan:  There are no diagnoses linked to this encounter.  No follow-ups on file.  @Shawndell Varas  Hildy Lowers DNP@  Note: This document was prepared by [employer] and any errors that results from this process are unintentional.

## 2023-07-03 ENCOUNTER — Ambulatory Visit (INDEPENDENT_AMBULATORY_CARE_PROVIDER_SITE_OTHER): Admitting: Family Medicine

## 2023-07-03 ENCOUNTER — Encounter: Payer: Self-pay | Admitting: Family Medicine

## 2023-07-03 VITALS — BP 111/67 | HR 91 | Temp 98.2°F | Ht 64.0 in | Wt 134.0 lb

## 2023-07-03 DIAGNOSIS — M25462 Effusion, left knee: Secondary | ICD-10-CM

## 2023-07-03 NOTE — Progress Notes (Signed)
   Acute Office Visit  Subjective:     Patient ID: Autumn Moyer, female    DOB: [birth year], 83 y.o.   MRN: [id number]  Chief Complaint  Patient presents with   Fluid pocket on left knee    Been there one week     HPI Patient is in today for a cyst on her left knee. She noticed this 1 week ago. She denies injury. No pain, tenderness, or erythema. No decreased ROM. Cyst disappeared when she was on her knees cleaning the tub a few days but has not reappeared. Hasn't tried any remedies.   ROS As per HPI.     Objective:    BP 111/67   Pulse 91   Temp 98.2 F (36.8 C) (Temporal)   Ht 5\' 4"  (1.626 m)   Wt 134 lb (60.8 kg)   SpO2 96%   BMI 23.00 kg/m    Physical Exam Vitals and nursing note reviewed.  Pulmonary:     Effort: Pulmonary effort is normal. No respiratory distress.  Musculoskeletal:     Comments: Small effusion to upper left anterior knee. Mobile fluid filled cyst over patella about 1.5 cm in diameter.   Skin:    General: Skin is warm and dry.  Neurological:     General: No focal deficit present.     Mental Status: She is oriented to person, place, and time.  Psychiatric:        Mood and Affect: Mood normal.        Behavior: Behavior normal.     No results found for any visits on 07/03/23.      Assessment & Plan:   Addilee was seen today for fluid pocket on left knee.  Diagnoses and all orders for this visit:  Effusion of left knee Discussed RICE therapy. Referral to ortho as below.  -     Ambulatory referral to Orthopedic Surgery  Tieasha Larsen Leonette Ramal, FNP

## 2023-07-03 NOTE — Patient Instructions (Signed)
 Fluid in the Knee: What It Means  Fluid in the knee happens when extra fluid builds up in your knee. In a healthy knee, there's a small amount of fluid. If there's extra fluid in your knee, it may mean something is wrong with your knee. It can be caused by: Very bad arthritis. An injury. An infection. An autoimmune disease. This is when your body's defense system (immune system) attacks tissues in your body by mistake. When there's too much fluid in your knee, you'll have pain and swelling. Your knee may feel stiff or too tight. This makes it hard for you to bend or move your knee. Your treatment will be based on what's causing the extra fluid in your knee. Follow these instructions at home: If you have a brace: Wear the brace as told. Take it off only if your doctor says you can. Check the skin around it every day. Tell your doctor if you see problems. Loosen it if your toes tingle, are numb, or turn cold and blue. Keep it clean. If the brace isn't waterproof: Do not let it get wet. Cover it when you take a bath or shower. Use a cover that doesn't let any water in. Managing pain, stiffness, and swelling  Use ice or an ice pack as told. If you have a brace that you can take off, remove it only as told. Place a towel between your skin and the ice. Leave the ice on for 20 minutes, 2-3 times a day. If your skin turns red, take off the ice right away to prevent skin damage. The risk of damage is higher if you can't feel pain, heat, or cold. Move your toes often to reduce stiffness and swelling. Raise your knee above the level of your heart while you're sitting or lying down. Use pillows as needed. Activity Rest as told. Do not stand or walk on your injured leg until you're told it's OK. Use crutches or a walker. Exercise as told. Ask what things are safe for you to do at home. Ask when it's safe to drive. General instructions Take your medicines only as told. Do not smoke, vape, or  use nicotine or tobacco. Wear an elastic bandage or a wrap that puts pressure on your knee (compression wrap) as told. Keep all follow-up visits. Your doctor will check your healing. Contact a doctor if: You have a fever or chills. The swelling and redness in your knee doesn't get better. The swelling and redness in your knee gets worse. Your pain doesn't get better with medicine. Your toes tingle or feel numb. Get help right away if: Your leg turns pale, cool, or blue. This information is not intended to replace advice given to you by your health care provider. Make sure you discuss any questions you have with your health care provider. Document Revised: 08/18/2022 Document Reviewed: 08/18/2022 Elsevier Patient Education  2024 [employer].

## 2023-07-12 DIAGNOSIS — M7042 Prepatellar bursitis, left knee: Secondary | ICD-10-CM | POA: Diagnosis not present

## 2023-07-12 DIAGNOSIS — Z96642 Presence of left artificial hip joint: Secondary | ICD-10-CM | POA: Diagnosis not present

## 2023-07-12 DIAGNOSIS — Z471 Aftercare following joint replacement surgery: Secondary | ICD-10-CM | POA: Diagnosis not present

## 2023-07-31 NOTE — Progress Notes (Unsigned)
 08/01/2023 Name: Autumn Moyer MRN: [id number] DOB: 26-Jan-1941  Chief Complaint  Patient presents with   Osteoporosis    Autumn Moyer is a 83 y.o. year old female who presented for a telephone visit.   They were referred to the pharmacist by their PCP for assistance in managing osteoporosis .    Subjective:  Care Team: Primary Care Provider: Yevette Hem, FNP ; Next Scheduled Visit: 08/18/23  Medication Access/Adherence  Current Pharmacy:  [facility] [address] [place] - [address] [place] [address] Phone: [phone] Fax: [phone]  [name] [address] [place] [address] Phone: [phone] Fax: [phone]   Patient reports affordability concerns with their medications: No  Patient reports access/transportation concerns to their pharmacy: No  Patient reports adherence concerns with their medications:  No     Osteoporosis:  Current medications: alendronate  70 mg weekly  Patient reports she is tolerating alendronate  well and has gotten back to her normal activities since the hip fracture in February She is trying to be more active with weight bearing exercises as below. Notes her breathing has significantly improved since her hernia repair in 2024 which has allowed her to be more active. No falls since the fall leading to her hip fracture in February. The fall in February was precipitated by severe dizziness which she thinks may have been vertigo. She denies issues with this since then.  Current supplements: calcium  carbonate 600 mg BID with food and vitamin D3 3000 units daily  Current physical activity: Has been trying to walk more - 15 minutes every day outside; she is also doing hip exercises at home - practicing walking sideways  Most recent DEXA: 06/09/23 RIGHT FEMORAL NECK:   BMD (in g/cm2): 0.572   T-score: -3.4   Z-score: -1.1   RIGHT TOTAL HIP:   BMD (in g/cm2):  0.743   T-score: -2.1   Z-score: 0.0   Rate of change from previous exam: -4.4 %   LEFT FOREARM (RADIUS 33%):   BMD (in g/cm2): 0.780   T-score: -1.2   Z-score: 1.5  IMPRESSION: Osteoporosis based on BMD.   Objective:  Lab Results  Component Value Date   HGBA1C 6.2 (H) 06/16/2022    Lab Results  Component Value Date   CREATININE 0.89 06/09/2023   BUN 14 06/09/2023   NA 140 06/09/2023   K 4.4 06/09/2023   CL 100 06/09/2023   CO2 23 06/09/2023    Lab Results  Component Value Date   CHOL 165 06/16/2022   HDL 43 06/16/2022   LDLCALC 86 06/16/2022   TRIG 214 (H) 06/16/2022   CHOLHDL 3.8 06/16/2022    Medications Reviewed Today     Reviewed by Philmore Bream, RPH (Pharmacist) on 08/01/23 at 1010  Med List Status: <None>   Medication Order Taking? Sig Documenting Provider Last Dose Status Informant  acetaminophen  (TYLENOL ) 500 MG tablet [id number]  Take 1,000 mg by mouth daily. [provider]  Active Self, Pharmacy Records  alendronate  (FOSAMAX ) 70 MG tablet [id number] Yes Take 1 tablet (70 mg total) by mouth once a week. Take with a full glass of water  on an empty stomach. Yevette Hem, FNP Taking Active   apixaban  (ELIQUIS ) 5 MG TABS tablet [id number]  Take 1 tablet (5 mg total) by mouth 2 (two) times daily. Tommas Fragmin A, FNP  Active   atorvastatin  (LIPITOR) 20 MG tablet [id number]  Take 1  tablet (20 mg total) by mouth daily. Yevette Hem, FNP  Active Self, Pharmacy Records  azithromycin  (ZITHROMAX ) 500 MG tablet [id number] Yes Take 500 mg by mouth daily. Taking before dentist visits [provider]  Active            Med Note Philmore Bream   Tue Aug 01, 2023 10:05 AM) Taking before dentist visits  benazepril -hydrochlorthiazide (LOTENSIN  HCT) 20-25 MG tablet [id number]  Take 1 tablet by mouth daily. [provider]  Active Self, Pharmacy Records  calcium  carbonate (OSCAL) 1500 (600 Ca) MG TABS tablet [id number] Yes Take 600  mg by mouth 2 (two) times daily with a meal. [provider] Taking Active   carboxymethylcellulose (REFRESH TEARS) 0.5 % SOLN [id number]  Place 2 drops into both eyes in the morning. [provider]  Active Self, Pharmacy Records  chlorhexidine  (HIBICLENS ) 4 % external liquid [id number]  Apply 15 mLs (1 Application total) topically as directed for 30 doses. Use as directed daily for 5 days every other week for 6 weeks. Earnie Gola, PA-C  Active   cholecalciferol (VITAMIN D3) 25 MCG (1000 UNIT) tablet [id number] Yes Take 3,000 Units by mouth daily. [provider] Taking Active   digoxin  (LANOXIN ) 0.125 MG tablet [id number]  Take 1 tablet (125 mcg total) by mouth daily. Yevette Hem, FNP  Active Self, Pharmacy Records  furosemide  (LASIX ) 20 MG tablet [id number]  Take 1 tablet (20 mg total) by mouth daily as needed for edema or fluid.  Patient not taking: Reported on 07/03/2023   Tommas Fragmin A, FNP  Active   levocetirizine (XYZAL ) 5 MG tablet [id number]  Take 1 tablet (5 mg total) by mouth every evening.  Patient taking differently: Take 5 mg by mouth 3 (three) times a week.   Yevette Hem, FNP  Active Self, Pharmacy Records  levothyroxine  (SYNTHROID ) 88 MCG tablet [id number]  Take 1 tablet (88 mcg total) by mouth daily before breakfast. Tommas Fragmin A, FNP  Active   methocarbamol  (ROBAXIN ) 500 MG tablet [id number]  Take 1 tablet (500 mg total) by mouth every 6 (six) hours as needed for muscle spasms (muscle pain).  Patient not taking: Reported on 07/03/2023   Earnie Gola, PA-C  Active   metoprolol  tartrate (LOPRESSOR ) 50 MG tablet [id number]  Take 1 tablet (50 mg total) by mouth 2 (two) times daily. Yevette Hem, FNP  Active Self, Pharmacy Records  mometasone  (ELOCON ) 0.1 % cream [id number]  Apply 1 Application topically daily.  Patient taking differently: Apply 1 Application topically daily as needed (eczema in ears).   Yevette Hem, FNP  Active  Self, Pharmacy Records  Multiple Vitamin (MULTIVITAMIN WITH MINERALS) TABS tablet [id number]  Take 1 tablet by mouth at bedtime. [provider]  Active Self, Pharmacy Records  tacrolimus  (PROTOPIC ) 0.1 % ointment [id number]  Apply topically in the morning and at bedtime.  Patient taking differently: Apply 1 Application topically in the morning.   Yevette Hem, FNP  Active Self, Pharmacy Records              Assessment/Plan:   Osteoporosis: - Currently appropriately managed - Recommend to continue daily calcium  intake of 1200 mg and vitamin D intake of 3000 units daily. Medication list updated. - Recommended to take calcium  carbonate with food for best absorption which she is doing. She is not currently taking any acid reducing medications so calcium  carbonate formulation is appropriate. - Confirmed appropriate administration of alendronate  once weekly  in the morning with water  at least 30 minutes before other food/drink and medications.  - Reviewed benefits of weight bearing exercise. Encouraged her to increase as able to at least 150 minutes per week.   Follow Up Plan: PCP on 08/18/23  Georga Killings, PharmD PGY-1 Pharmacy Resident  Marvell Slider, PharmD, BCACP, CPP Clinical Pharmacist, [facility]

## 2023-08-01 ENCOUNTER — Other Ambulatory Visit (INDEPENDENT_AMBULATORY_CARE_PROVIDER_SITE_OTHER): Admitting: Pharmacist

## 2023-08-01 DIAGNOSIS — M810 Age-related osteoporosis without current pathological fracture: Secondary | ICD-10-CM

## 2023-08-18 ENCOUNTER — Ambulatory Visit: Admitting: Family

## 2023-09-05 ENCOUNTER — Ambulatory Visit: Admitting: Family

## 2023-09-05 ENCOUNTER — Encounter: Payer: Self-pay | Admitting: Family

## 2023-09-05 VITALS — BP 114/82 | HR 66 | Temp 98.1°F | Ht 64.0 in | Wt 137.0 lb

## 2023-09-05 DIAGNOSIS — I350 Nonrheumatic aortic (valve) stenosis: Secondary | ICD-10-CM

## 2023-09-05 DIAGNOSIS — R7303 Prediabetes: Secondary | ICD-10-CM | POA: Diagnosis not present

## 2023-09-05 DIAGNOSIS — I4811 Longstanding persistent atrial fibrillation: Secondary | ICD-10-CM

## 2023-09-05 DIAGNOSIS — E781 Pure hyperglyceridemia: Secondary | ICD-10-CM | POA: Diagnosis not present

## 2023-09-05 DIAGNOSIS — Z952 Presence of prosthetic heart valve: Secondary | ICD-10-CM | POA: Diagnosis not present

## 2023-09-05 DIAGNOSIS — E039 Hypothyroidism, unspecified: Secondary | ICD-10-CM | POA: Diagnosis not present

## 2023-09-05 DIAGNOSIS — E663 Overweight: Secondary | ICD-10-CM | POA: Diagnosis not present

## 2023-09-05 DIAGNOSIS — Z79899 Other long term (current) drug therapy: Secondary | ICD-10-CM | POA: Diagnosis not present

## 2023-09-05 DIAGNOSIS — Z7901 Long term (current) use of anticoagulants: Secondary | ICD-10-CM

## 2023-09-05 DIAGNOSIS — I10 Essential (primary) hypertension: Secondary | ICD-10-CM

## 2023-09-05 LAB — BAYER DCA HB A1C WAIVED: HB A1C (BAYER DCA - WAIVED): 6.5 % — ABNORMAL HIGH (ref 4.8–5.6)

## 2023-09-05 NOTE — Progress Notes (Signed)
 Subjective:    Patient ID: Autumn Moyer, female    DOB: January 05, 1941, 83 y.o.   MRN: [id number]  Chief Complaint  Patient presents with   Thyroid  Problem   Pt presents to the offic today for  chronic follow up.   She is followed by Cardiologists  for A Fib, Aortic stenosis. She is currently on Eliquis  5 mg BID.    She had aortic valve replacement on 12/14/21.  Hypertension This is a chronic problem. The current episode started more than 1 year ago. The problem has been resolved since onset. The problem is controlled. Associated symptoms include malaise/fatigue (some days) and peripheral edema (left leg). Pertinent negatives include no shortness of breath. Risk factors for coronary artery disease include dyslipidemia, obesity and sedentary lifestyle. The current treatment provides moderate improvement. Identifiable causes of hypertension include a thyroid  problem.  Gastroesophageal Reflux She complains of belching, heartburn and a hoarse voice. This is a chronic problem. The current episode started more than 1 year ago. The problem occurs occasionally. The symptoms are aggravated by certain foods. Associated symptoms include fatigue. Risk factors include obesity. She has tried a PPI for the symptoms. The treatment provided moderate relief.  Thyroid  Problem Presents for follow-up visit. Symptoms include dry skin, fatigue and hoarse voice. Patient reports no constipation or depressed mood. The symptoms have been stable.  Anemia Presents for follow-up visit. Symptoms include malaise/fatigue (some days).  Diabetes She presents for her follow-up diabetic visit. Diabetes type: prediabetes. Associated symptoms include fatigue. Risk factors for coronary artery disease include dyslipidemia, diabetes mellitus, hypertension and sedentary lifestyle. She is following a generally healthy diet. Her overall blood glucose range is 90-110 mg/dl.      Review of Systems  Constitutional:  Positive for  fatigue and malaise/fatigue (some days).  HENT:  Positive for hoarse voice.   Respiratory:  Negative for shortness of breath.   Gastrointestinal:  Positive for heartburn. Negative for constipation.  All other systems reviewed and are negative.      Objective:   Physical Exam Vitals reviewed.  Constitutional:      General: She is not in acute distress.    Appearance: She is well-developed.  HENT:     Head: Normocephalic and atraumatic.     Right Ear: Tympanic membrane normal.     Left Ear: Tympanic membrane normal.   Eyes:     Pupils: Pupils are equal, round, and reactive to light.   Neck:     Thyroid : No thyromegaly.   Cardiovascular:     Rate and Rhythm: Normal rate. Rhythm irregular.     Heart sounds: Normal heart sounds. No murmur heard. Pulmonary:     Effort: Pulmonary effort is normal. No respiratory distress.     Breath sounds: Normal breath sounds. No wheezing.  Abdominal:     General: Bowel sounds are normal. There is no distension.     Palpations: Abdomen is soft.     Tenderness: There is no abdominal tenderness.   Musculoskeletal:        General: No tenderness. Normal range of motion.     Cervical back: Normal range of motion and neck supple.   Skin:    General: Skin is warm and dry.   Neurological:     Mental Status: She is alert and oriented to person, place, and time.     Cranial Nerves: No cranial nerve deficit.     Deep Tendon Reflexes: Reflexes are normal and symmetric.   Psychiatric:  Behavior: Behavior normal.        Thought Content: Thought content normal.        Judgment: Judgment normal.       BP 114/82   Pulse 66   Temp 98.1 F (36.7 C) (Temporal)   Ht 5' 4 (1.626 m)   Wt 137 lb (62.1 kg)   BMI 23.52 kg/m      Assessment & Plan:  Autumn Moyer comes in today with chief complaint of Thyroid  Problem   Diagnosis and orders addressed:  1. Hypothyroidism, unspecified type (Primary) - CMP14+EGFR - TSH  2. Essential  hypertension, benign  - CMP14+EGFR  3. Overweight (BMI 25.0-29.9) - CMP14+EGFR  4. Longstanding persistent atrial fibrillation (HCC)  - CMP14+EGFR  5. Severe aortic stenosis  - CMP14+EGFR  6. Prediabetes  - CMP14+EGFR - Bayer DCA Hb A1c Waived  7. Long term current use of anticoagulant therapy  - CMP14+EGFR  8. S/P TAVR (transcatheter aortic valve replacement) - CMP14+EGFR   Labs pending Continue current medications  Health Maintenance reviewed Diet and exercise encouraged  Follow up plan: 4 months   Tommas Fragmin, FNP

## 2023-09-05 NOTE — Addendum Note (Signed)
 Addended by: Lisbeth Rides on: 09/05/2023 03:33 PM   Modules accepted: Orders

## 2023-09-05 NOTE — Patient Instructions (Signed)

## 2023-09-06 ENCOUNTER — Ambulatory Visit: Payer: Self-pay | Admitting: Cardiology

## 2023-09-06 LAB — LIPID PANEL
Chol/HDL Ratio: 2.3 ratio (ref 0.0–4.4)
Cholesterol, Total: 99 mg/dL — ABNORMAL LOW (ref 100–199)
HDL: 43 mg/dL (ref >39)
LDL Chol Calc (NIH): 30 mg/dL (ref 0–99)
Triglycerides: 155 mg/dL — ABNORMAL HIGH (ref 0–149)
VLDL Cholesterol Cal: 26 mg/dL (ref 5–40)

## 2023-09-06 LAB — CMP14+EGFR
ALT: 16 IU/L (ref 0–32)
AST: 21 IU/L (ref 0–40)
Albumin: 4.2 g/dL (ref 3.8–4.8)
Alkaline Phosphatase: 55 IU/L (ref 44–121)
BUN/Creatinine Ratio: 22 (ref 12–28)
BUN: 19 mg/dL (ref 8–27)
Bilirubin Total: 1.3 mg/dL — ABNORMAL HIGH (ref 0.0–1.2)
CO2: 25 mmol/L (ref 20–29)
Calcium: 9.8 mg/dL (ref 8.7–10.3)
Chloride: 102 mmol/L (ref 96–106)
Creatinine, Ser: 0.87 mg/dL (ref 0.57–1.00)
Globulin, Total: 2.5 g/dL (ref 1.5–4.5)
Glucose: 126 mg/dL — ABNORMAL HIGH (ref 70–99)
Potassium: 4 mmol/L (ref 3.5–5.2)
Sodium: 140 mmol/L (ref 134–144)
Total Protein: 6.7 g/dL (ref 6.0–8.5)
eGFR: 67 mL/min/{1.73_m2} (ref >59)

## 2023-09-06 LAB — TSH: TSH: 0.628 u[IU]/mL (ref 0.450–4.500)

## 2023-09-11 ENCOUNTER — Ambulatory Visit: Payer: Self-pay | Admitting: Family

## 2023-10-07 ENCOUNTER — Other Ambulatory Visit: Payer: Self-pay | Admitting: Family

## 2023-10-07 DIAGNOSIS — I10 Essential (primary) hypertension: Secondary | ICD-10-CM

## 2023-10-20 ENCOUNTER — Telehealth: Payer: Self-pay

## 2023-10-20 ENCOUNTER — Other Ambulatory Visit: Payer: Self-pay | Admitting: Family

## 2023-10-20 DIAGNOSIS — Z1231 Encounter for screening mammogram for malignant neoplasm of breast: Secondary | ICD-10-CM

## 2023-10-20 NOTE — Telephone Encounter (Signed)
 Attempted to call patient back since she called requesting information about how her care will be followed after 1 year since her TAVR surgery. Left a message to return call and to schedule echo. Per Izetta Hummer, I planned to discuss that Dr. Lavona will be following her care and that she will need an echo every year s/p TAVR. There is already a recall to schedule pt with Dr. Lavona in 04/2024, but we can schedule the echo now if patient would desires.

## 2023-11-02 ENCOUNTER — Other Ambulatory Visit: Payer: Self-pay

## 2023-11-02 MED ORDER — AZITHROMYCIN 500 MG PO TABS: ORAL_TABLET | ORAL | 6 refills | Status: DC

## 2023-11-22 DIAGNOSIS — D225 Melanocytic nevi of trunk: Secondary | ICD-10-CM | POA: Diagnosis not present

## 2023-11-22 DIAGNOSIS — L820 Inflamed seborrheic keratosis: Secondary | ICD-10-CM | POA: Diagnosis not present

## 2023-11-22 DIAGNOSIS — Z1283 Encounter for screening for malignant neoplasm of skin: Secondary | ICD-10-CM | POA: Diagnosis not present

## 2023-11-22 DIAGNOSIS — L218 Other seborrheic dermatitis: Secondary | ICD-10-CM | POA: Diagnosis not present

## 2023-11-22 DIAGNOSIS — X32XXXD Exposure to sunlight, subsequent encounter: Secondary | ICD-10-CM | POA: Diagnosis not present

## 2023-11-22 DIAGNOSIS — L570 Actinic keratosis: Secondary | ICD-10-CM | POA: Diagnosis not present

## 2023-12-03 ENCOUNTER — Telehealth: Payer: Self-pay | Admitting: Family

## 2023-12-03 DIAGNOSIS — J309 Allergic rhinitis, unspecified: Secondary | ICD-10-CM

## 2023-12-04 ENCOUNTER — Ambulatory Visit
Admission: RE | Admit: 2023-12-04 | Discharge: 2023-12-04 | Disposition: A | Discharge status: Home / Self Care | Source: Ambulatory Visit | Attending: Family | Admitting: Family

## 2023-12-04 DIAGNOSIS — Z1231 Encounter for screening mammogram for malignant neoplasm of breast: Secondary | ICD-10-CM

## 2023-12-04 MED ORDER — ALENDRONATE SODIUM 70 MG PO TABS: 70.0000 mg | ORAL_TABLET | ORAL | 0 refills | Status: DC

## 2023-12-11 ENCOUNTER — Ambulatory Visit: Admitting: Family

## 2023-12-21 ENCOUNTER — Ambulatory Visit (INDEPENDENT_AMBULATORY_CARE_PROVIDER_SITE_OTHER): Admitting: Nurse Practitioner

## 2023-12-21 ENCOUNTER — Encounter: Payer: Self-pay | Admitting: Nurse Practitioner

## 2023-12-21 VITALS — BP 128/78 | HR 66 | Temp 97.6°F | Ht 64.0 in | Wt 138.4 lb

## 2023-12-21 DIAGNOSIS — B001 Herpesviral vesicular dermatitis: Secondary | ICD-10-CM

## 2023-12-21 DIAGNOSIS — Z831 Family history of other infectious and parasitic diseases: Secondary | ICD-10-CM | POA: Insufficient documentation

## 2023-12-21 MED ORDER — DOCOSANOL 10 % EX CREA
TOPICAL_CREAM | CUTANEOUS | 0 refills | Status: DC
Start: 2023-12-21 — End: 2023-12-26

## 2023-12-21 NOTE — Progress Notes (Signed)
 Subjective:  Patient ID: Autumn Moyer, female    DOB: 1940/08/03, 83 y.o.   MRN: [id number]  Patient Care Team: Lavell Bari LABOR, FNP as PCP - General (Family Medicine) Lavona Agent, MD as PCP - Cardiology (Cardiology) Skeet Juliene SAUNDERS, DO as Consulting Physician (Neurology)   Chief Complaint:  Rash (Rash around mouth off and on for 2 weeks)   HPI: Autumn Moyer is a 83 y.o. female presenting on 12/21/2023 for Rash (Rash around mouth off and on for 2 weeks)   Discussed the use of AI scribe software for clinical note transcription with the patient, who gave verbal consent to proceed.  History of Present Illness Autumn Moyer is an 83 year old female who presents with sore and peeling lips.  She has been experiencing sore and peeling lips for a couple of weeks, with symptoms worsening on Saturday. She initially considered a dental device as a potential cause but has since ruled it out.  No new medications or known causes for her symptoms. She has never experienced cold sores before but notes that the sores resemble them. She has been using Aquaphor, which her husband uses for oxygen therapy at night, but it has not provided relief.  No fever is present. She has a history of eczema, which she mentions in the context of her current symptoms.      Relevant past medical, surgical, family, and social history reviewed and updated as indicated.  Allergies and medications reviewed and updated. Data reviewed: Chart in Epic.   Past Medical History:  Diagnosis Date   Arthritis    Atrial fibrillation, persistent (HCC)    Complication of anesthesia    Dysrhythmia    A. Fib   GERD (gastroesophageal reflux disease)    Related to Hiatal Hernia   Headache    Localized in bilateral eyes   Heart murmur    TAVR 2023   Hiatal hernia    Hypertension    Hypothyroidism    PONV (postoperative nausea and vomiting)    Pre-diabetes    S/P TAVR (transcatheter aortic valve replacement)  12/14/2021   s/p TAVR with a 23mm Edwards S3UR via the TF approach by Dr. Wendel & Dr. Maryjane   Severe aortic stenosis    Squamous cell carcinoma of skin 01/16/2018   in situ-right knee post (txpbx)   Thyroid  disease     Past Surgical History:  Procedure Laterality Date   ABDOMINAL AORTOGRAM N/A 09/22/2021   Procedure: ABDOMINAL AORTOGRAM;  Surgeon: Wendel Lurena POUR, MD;  Location: [facility];  Service: Cardiovascular;  Laterality: N/A;   ESOPHAGOGASTRODUODENOSCOPY N/A 01/09/2023   Procedure: ESOPHAGOGASTRODUODENOSCOPY (EGD);  Surgeon: Shyrl Linnie KIDD, MD;  Location: [facility] OR;  Service: Thoracic;  Laterality: N/A;   EYE SURGERY     INTRAOPERATIVE TRANSTHORACIC ECHOCARDIOGRAM N/A 12/14/2021   Procedure: INTRAOPERATIVE TRANSTHORACIC ECHOCARDIOGRAM;  Surgeon: Wendel Lurena POUR, MD;  Location: [facility] OR;  Service: Open Heart Surgery;  Laterality: N/A;   MULTIPLE EXTRACTIONS WITH ALVEOLOPLASTY N/A 12/02/2021   Procedure: MULTIPLE EXTRACTION WITH ALVEOLOPLASTY;  Surgeon: Celena Lum NOVAK, DMD;  Location: [facility] OR;  Service: Dentistry;  Laterality: N/A;   RIGHT HEART CATH AND CORONARY/GRAFT ANGIOGRAPHY N/A 09/22/2021   Procedure: RIGHT HEART CATH AND CORONARY/GRAFT ANGIOGRAPHY;  Surgeon: Wendel Lurena POUR, MD;  Location: [facility];  Service: Cardiovascular;  Laterality: N/A;   TOE SURGERY Right    Hammer Toes   TOTAL HIP ARTHROPLASTY Left 05/13/2023   Procedure:  TOTAL HIP ARTHROPLASTY ANTERIOR APPROACH;  Surgeon: Ernie Cough, MD;  Location: [facility] OR;  Service: Orthopedics;  Laterality: Left;   TRANSCATHETER AORTIC VALVE REPLACEMENT, TRANSFEMORAL N/A 12/14/2021   Procedure: Transcatheter Aortic Valve Replacement, Transfemoral using Edwards 23 MM SAPIEN 3 Ultra;  Surgeon: Thukkani, Arun K, MD;  Location: [facility] OR;  Service: Open Heart Surgery;  Laterality: N/A;  Transfemoral approach   TUBAL LIGATION  1986   XI ROBOTIC ASSISTED HIATAL HERNIA REPAIR N/A 01/09/2023   Procedure: XI ROBOTIC  ASSISTED HIATAL HERNIA REPAIR WITH FUNDOPLICATION;  Surgeon: Shyrl Linnie KIDD, MD;  Location: [facility] OR;  Service: Thoracic;  Laterality: N/A;    Social History   Socioeconomic History   Marital status: Married    Spouse name: Danny   Number of children: 2   Years of education: Not on file   Highest education level: Not on file  Occupational History   Not on file  Tobacco Use   Smoking status: Never    Passive exposure: Past   Smokeless tobacco: Never  Vaping Use   Vaping status: Never Used  Substance and Sexual Activity   Alcohol use: No    Alcohol/week: 0.0 standard drinks of alcohol   Drug use: No   Sexual activity: Not Currently    Partners: Male  Other Topics Concern   Not on file  Social History Narrative   Lives with husband.  Married x 56 years in 2022.   2 daughters   5 grand children   1 great grandchild.   Social Drivers of Corporate investment banker Strain: Low Risk  (03/27/2023)   Overall Financial Resource Strain (CARDIA)    Difficulty of Paying Living Expenses: Not hard at all  Food Insecurity: No Food Insecurity (05/13/2023)   Hunger Vital Sign    Worried About Running Out of Food in the Last Year: Never true    Ran Out of Food in the Last Year: Never true  Transportation Needs: No Transportation Needs (05/13/2023)   PRAPARE - Administrator, Civil Service (Medical): No    Lack of Transportation (Non-Medical): No  Physical Activity: Sufficiently Active (03/27/2023)   Exercise Vital Sign    Days of Exercise per Week: 5 days    Minutes of Exercise per Session: 30 min  Stress: No Stress Concern Present (03/27/2023)   [employer] of Occupational Health - Occupational Stress Questionnaire    Feeling of Stress : Not at all  Social Connections: Moderately Integrated (05/13/2023)   Social Connection and Isolation Panel    Frequency of Communication with Friends and Family: Three times a week    Frequency of Social Gatherings with Friends and  Family: Twice a week    Attends Religious Services: More than 4 times per year    Active Member of [employer] or Organizations: No    Attends Banker Meetings: Never    Marital Status: Married  Catering manager Violence: Not At Risk (05/13/2023)   Humiliation, Afraid, Rape, and Kick questionnaire    Fear of Current or Ex-Partner: No    Emotionally Abused: No    Physically Abused: No    Sexually Abused: No    Outpatient Encounter Medications as of 12/21/2023  Medication Sig   acetaminophen  (TYLENOL ) 500 MG tablet Take 1,000 mg by mouth daily.   alendronate  (FOSAMAX ) 70 MG tablet Take 1 tablet (70 mg total) by mouth once a week. Take with a full glass of water  on an empty stomach.  apixaban  (ELIQUIS ) 5 MG TABS tablet Take 1 tablet (5 mg total) by mouth 2 (two) times daily.   atorvastatin  (LIPITOR) 20 MG tablet Take 1 tablet (20 mg total) by mouth daily.   azithromycin  (ZITHROMAX ) 500 MG tablet Take 1 tablet by mouth 1 hour before dental cleanings and procedures   benazepril -hydrochlorthiazide (LOTENSIN  HCT) 20-25 MG tablet Take 1 tablet by mouth once daily   calcium  carbonate (OSCAL) 1500 (600 Ca) MG TABS tablet Take 600 mg by mouth 2 (two) times daily with a meal.   carboxymethylcellulose (REFRESH TEARS) 0.5 % SOLN Place 2 drops into both eyes in the morning.   chlorhexidine  (HIBICLENS ) 4 % external liquid Apply 15 mLs (1 Application total) topically as directed for 30 doses. Use as directed daily for 5 days every other week for 6 weeks.   cholecalciferol (VITAMIN D3) 25 MCG (1000 UNIT) tablet Take 3,000 Units by mouth daily.   digoxin  (LANOXIN ) 0.125 MG tablet Take 1 tablet (125 mcg total) by mouth daily.   Docosanol 10 % CREA Small amount on the lipids TID  PRN   furosemide  (LASIX ) 20 MG tablet Take 1 tablet (20 mg total) by mouth daily as needed for edema or fluid.   levocetirizine (XYZAL ) 5 MG tablet TAKE 1 TABLET BY MOUTH ONCE DAILY IN THE EVENING   levothyroxine  (SYNTHROID )  88 MCG tablet Take 1 tablet (88 mcg total) by mouth daily before breakfast.   metoprolol  tartrate (LOPRESSOR ) 50 MG tablet Take 1 tablet (50 mg total) by mouth 2 (two) times daily.   mometasone  (ELOCON ) 0.1 % cream Apply 1 Application topically daily.   Multiple Vitamin (MULTIVITAMIN WITH MINERALS) TABS tablet Take 1 tablet by mouth at bedtime.   tacrolimus  (PROTOPIC ) 0.1 % ointment Apply topically in the morning and at bedtime.   No facility-administered encounter medications on file as of 12/21/2023.    Allergies  Allergen Reactions   Noroxin [Norfloxacin] Nausea And Vomiting   Augmentin  [Amoxicillin -Pot Clavulanate] Swelling    Gums swelling, lips numb   Rosuvastatin  Other (See Comments)    Pain in back of legs   Peppermint Spirit (Bulk) Other (See Comments)    Causes sneezing fits    Pertinent ROS per HPI, otherwise unremarkable      Objective:  BP 128/78   Pulse 66   Temp 97.6 F (36.4 C) (Temporal)   Ht 5' 4 (1.626 m)   Wt 138 lb 6.4 oz (62.8 kg)   SpO2 96%   BMI 23.76 kg/m    Wt Readings from Last 3 Encounters:  12/21/23 138 lb 6.4 oz (62.8 kg)  09/05/23 137 lb (62.1 kg)  07/03/23 134 lb (60.8 kg)    Physical Exam Vitals and nursing note reviewed.  Constitutional:      General: She is not in acute distress.    Appearance: Normal appearance.  HENT:     Head: Normocephalic and atraumatic.     Nose: Nose normal.     Mouth/Throat:     Lips: Lesions present.     Mouth: Mucous membranes are dry.   Eyes:     General: No scleral icterus.    Extraocular Movements: Extraocular movements intact.     Conjunctiva/sclera: Conjunctivae normal.     Pupils: Pupils are equal, round, and reactive to light.  Cardiovascular:     Heart sounds: Normal heart sounds.  Pulmonary:     Effort: Pulmonary effort is normal.     Breath sounds: Normal breath sounds.  Musculoskeletal:  General: Normal range of motion.     Right lower leg: No edema.     Left lower leg: No  edema.  Skin:    General: Skin is warm and dry.  Neurological:     Mental Status: She is alert and oriented to person, place, and time.  Psychiatric:        Mood and Affect: Mood normal.        Behavior: Behavior normal.        Thought Content: Thought content normal.        Judgment: Judgment normal.    Physical Exam      Results for orders placed or performed in visit on 09/05/23  Bayer DCA Hb A1c Waived   Collection Time: 09/05/23  3:34 PM  Result Value Ref Range   HB A1C (BAYER DCA - WAIVED) 6.5 (H) 4.8 - 5.6 %  CMP14+EGFR   Collection Time: 09/05/23  3:42 PM  Result Value Ref Range   Glucose 126 (H) 70 - 99 mg/dL   BUN 19 8 - 27 mg/dL   Creatinine, Ser 9.12 0.57 - 1.00 mg/dL   eGFR 67 >40 fO/fpw/8.26   BUN/Creatinine Ratio 22 12 - 28   Sodium 140 134 - 144 mmol/L   Potassium 4.0 3.5 - 5.2 mmol/L   Chloride 102 96 - 106 mmol/L   CO2 25 20 - 29 mmol/L   Calcium  9.8 8.7 - 10.3 mg/dL   Total Protein 6.7 6.0 - 8.5 g/dL   Albumin 4.2 3.8 - 4.8 g/dL   Globulin, Total 2.5 1.5 - 4.5 g/dL   Bilirubin Total 1.3 (H) 0.0 - 1.2 mg/dL   Alkaline Phosphatase 55 44 - 121 IU/L   AST 21 0 - 40 IU/L   ALT 16 0 - 32 IU/L  TSH   Collection Time: 09/05/23  3:42 PM  Result Value Ref Range   TSH 0.628 0.450 - 4.500 uIU/mL       Pertinent labs & imaging results that were available during my care of the patient were reviewed by me and considered in my medical decision making.  Assessment & Plan:  Jaliana was seen today for rash.  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 - Prescribed Abreva for topical application. Advised over-the-counter purchase if not covered by insurance. - Ordered lab tests to rule out  viral infection.      Continue all other maintenance medications.  Follow up plan: Return for follow up with PCPvas already scheduled on Thursady.   Continue healthy lifestyle choices, including diet (rich in fruits, vegetables, and lean proteins, and low in salt and simple carbohydrates) and exercise (at least 30 minutes of moderate physical activity daily).  Educational handout given for    Clinical References  Cold Sore  A cold sore, also called a fever blister, is a small, fluid-filled sore that forms inside of the mouth or on the lips, gums, nose, chin, or cheeks. Cold sores can spread to other parts of the body, such as the eyes, fingers, or genitals. Cold sores can spread from person to person (are contagious) until the sores crust over completely. Most cold sores go  away within 2 weeks. What are the causes? Cold sores are caused by a virus (herpes simplex virus type 1, HSV-1). The virus can spread from person to person through close contact, such as through: Kissing. Touching the affected area. Sharing personal items such as lip balm, razors, a drinking glass, or eating utensils. What increases the risk? Being tired, stressed, or sick. Having your period (menstruating). Being pregnant. Taking certain medicines. Being out in cold weather or getting too much sun. What are the signs or symptoms? Symptoms of a cold sore go through different stages: Tingling, itching, or burning is felt 1-2 days before the cold sore appears. Fluid-filled blisters appear on the lips, inside the mouth, on the nose, or on the cheeks. The blisters start to ooze clear fluid. The blisters dry up, and a yellow crust appears in their place. The crust falls off. In some cases, other symptoms can develop along with cold sores. These can include: Fever. Sore throat. Headache. Muscle aches. Swollen neck glands. How is this treated? There is no cure for cold sores or the virus that causes them.  There is also no vaccine to prevent the virus. Most cold sores go away on their own without treatment within 2 weeks. Your doctor may prescribe medicines to: Help with pain. Keep the virus from growing. Help you heal faster. Medicines may be in the form of creams, gels, pills, or a shot. Follow these instructions at home: Medicines Take or apply over-the-counter and prescription medicines only as told by your doctor. Use a cotton-tip swab to apply creams or gels to your sores. Ask your doctor if you can take lysine supplements. These may help with healing. Sore care  Do not touch the sores or pick the scabs. Wash your hands often with soap and water  for at least 20 seconds. Do not touch your eyes without washing your hands first. Keep the sores clean and dry. If told, put ice on the sores. To do this: Put ice in a plastic bag. Place a towel between your skin and the bag. Leave the ice on for 20 minutes, 2-3 times a day. Take off the ice if your skin turns bright red. This is very important. If you cannot feel pain, heat, or cold, you have a greater risk of damage to the area. Eating and drinking Eat a soft, bland diet. Avoid eating hot, cold, or salty foods. These can hurt your mouth. Use a straw if it hurts to drink out of a glass. Eat foods that have a lot of lysine in them. These include meat, fish, and dairy products. Avoid sugary foods, chocolates, nuts, and grains. These foods have a high amount of a substance (arginine) that can cause the virus to grow. Lifestyle Do not kiss, have oral sex, or share personal items until your sores heal. Stress, poor sleep, and being out in the sun can trigger a cold sore. Make sure you: Do activities that help you relax, such as deep breathing exercises or meditation. Get enough sleep. Put sunscreen on your lips before you go out in the sun. Contact a doctor if: You have symptoms for more than 2 weeks. You have pus coming from the sores. You  have redness that is spreading. You have pain or irritation in your eye. You get sores on your genitals. Your sores do not heal within 2 weeks. You get cold sores often. Get help right away if: You have a fever and your symptoms suddenly get worse. You have  a headache and confusion. You have tiredness (fatigue). You do not want to eat as much as normal (loss of appetite). You have a stiff neck or are sensitive to light. Summary A cold sore is a small, fluid-filled sore that forms inside of the mouth or on the lips, gums, nose, chin, or cheeks. Cold sores can spread from person to person (are contagious) until the sores crust over completely. Most cold sores go away within 2 weeks. Wash your hands often. Do not touch your eyes without washing your hands first. Do not kiss, have oral sex, or share personal items until your sores heal. Contact a doctor if your sores do not heal within 2 weeks. This information is not intended to replace advice given to you by your health care provider. Make sure you discuss any questions you have with your health care provider. Document Revised: 12/16/2020 Document Reviewed: 12/16/2020 Elsevier Patient Education  2024 Elsevier Inc. Itchy, Irritated Skin (Eczema): What to Know Eczema is a group of skin conditions that cause rough and inflamed skin. There are different types of eczema. They each have different triggers, symptoms, and treatments. Eczema is not contagious, so it doesn't spread from person to person. It can appear on different parts of your body at different times. It doesn't look the same on everyone. What are the causes? The exact cause of eczema isn't known. Things that can make it worse includes: Irritants. Allergens. What are the signs or symptoms? Symptoms depend on the type of eczema you have. They can range from mild to very bad. Symptoms often include: Itchiness. Dry skin. Rash or skin bumps. Swelling. Crusty, flaky, or scaly  patches. Thick patches of skin. Oozing skin or blisters. How is this diagnosed? This condition may be diagnosed based on: Symptoms. Physical exam. Medical history. Skin patch tests that use allergen patches on your back to check for allergic reactions. You may need to see a skin specialist called a dermatologist. This specialist can help diagnose and treat this condition. How is this treated? There's no cure for eczema, but you can manage your symptoms. Treatment depends on the type of eczema you have. Options may include: Medicines to lessen itching (antihistamines). Medicine to put on your skin to lessen swelling and irritation (corticosteroid creams or ointments). Light therapy, also called phototherapy. The affected skin is put under ultraviolet (UV) light. Medicines may be prescribed or purchased at the store. This will depend on the strength that's needed. Follow these instructions at home: Skin Care Use skin creams or lotions as told. Do not scratch your skin. This can make your rash worse. Keep fingernails short to avoid scratching open the skin. General instructions Take or apply your medicines as told. Avoid triggers and allergens. Treat symptoms fast if you have a flare-up. Keep all follow-up visits to make sure your treatment plan is working. Where to find more information American Academy of Dermatology: MarketingSheets.si National Eczema Association: nationaleczema.org The Society for Pediatric Dermatology: pedsderm.net Contact a health care provider if: You have very bad itching even with treatment. You often scratch your skin until it bleeds. Your rash looks different than normal. You have a fever. You have symptoms that don't go away with treatment. You have more redness, pain, or swelling over the affected skin. You have warmth or pus coming from the affected skin. This information is not intended to replace advice given to you by your health care provider. Make sure you  discuss any questions you have with your health  care provider. Document Revised: 08/09/2022 Document Reviewed: 08/09/2022 Elsevier Patient Education  2024 Elsevier Inc.  The above assessment and management plan was discussed with the patient. The patient verbalized understanding of and has agreed to the management plan. Patient is aware to call the clinic if they develop any new symptoms or if symptoms persist or worsen. Patient is aware when to return to the clinic for a follow-up visit. Patient educated on when it is appropriate to go to the emergency department.  Maleki Hippe [place], DNP [facility] [address] [place], [place] [address] [phone]

## 2023-12-21 NOTE — Telephone Encounter (Signed)
 Pharmacy needs additional information to process prescription. Please follow up.   Copied from CRM [id number]. Topic: Clinical - Prescription Issue >> Dec 21, 2023  4:09 PM Wess RAMAN wrote: Reason for CRM: Pharmacy stated the prescription needs to specify area of application and amount of grams to apply to area for Docosanol 10 % CREA   Pharmacy: [id number] - [place], [place] - [id number] [place] [address] Phone: [phone] Fax: [phone] Hours: Not open 24 hours

## 2023-12-22 LAB — HSV 1 AND 2 AB, IGG
HSV 1 Glycoprotein G Ab, IgG: REACTIVE — AB
HSV 2 IgG, Type Spec: NONREACTIVE

## 2023-12-26 ENCOUNTER — Ambulatory Visit: Payer: Self-pay | Admitting: Nurse Practitioner

## 2023-12-26 ENCOUNTER — Encounter: Payer: Self-pay | Admitting: Family

## 2023-12-26 ENCOUNTER — Ambulatory Visit: Admitting: Family

## 2023-12-26 ENCOUNTER — Other Ambulatory Visit: Payer: Self-pay | Admitting: Nurse Practitioner

## 2023-12-26 VITALS — BP 117/72 | HR 59 | Temp 98.0°F | Ht 64.0 in | Wt 137.0 lb

## 2023-12-26 DIAGNOSIS — I10 Essential (primary) hypertension: Secondary | ICD-10-CM

## 2023-12-26 DIAGNOSIS — E039 Hypothyroidism, unspecified: Secondary | ICD-10-CM | POA: Diagnosis not present

## 2023-12-26 DIAGNOSIS — E663 Overweight: Secondary | ICD-10-CM | POA: Diagnosis not present

## 2023-12-26 DIAGNOSIS — Z0001 Encounter for general adult medical examination with abnormal findings: Secondary | ICD-10-CM | POA: Diagnosis not present

## 2023-12-26 DIAGNOSIS — Z0000 Encounter for general adult medical examination without abnormal findings: Secondary | ICD-10-CM | POA: Diagnosis not present

## 2023-12-26 DIAGNOSIS — I4891 Unspecified atrial fibrillation: Secondary | ICD-10-CM | POA: Diagnosis not present

## 2023-12-26 DIAGNOSIS — Z23 Encounter for immunization: Secondary | ICD-10-CM | POA: Diagnosis not present

## 2023-12-26 DIAGNOSIS — B002 Herpesviral gingivostomatitis and pharyngotonsillitis: Secondary | ICD-10-CM

## 2023-12-26 DIAGNOSIS — R7303 Prediabetes: Secondary | ICD-10-CM

## 2023-12-26 DIAGNOSIS — D509 Iron deficiency anemia, unspecified: Secondary | ICD-10-CM | POA: Diagnosis not present

## 2023-12-26 DIAGNOSIS — K2100 Gastro-esophageal reflux disease with esophagitis, without bleeding: Secondary | ICD-10-CM

## 2023-12-26 DIAGNOSIS — B001 Herpesviral vesicular dermatitis: Secondary | ICD-10-CM

## 2023-12-26 LAB — CBC WITH DIFFERENTIAL/PLATELET
Basophils Absolute: 0.1 x10E3/uL (ref 0.0–0.2)
Basos: 1 %
EOS (ABSOLUTE): 0.2 x10E3/uL (ref 0.0–0.4)
Eos: 3 %
Hematocrit: 39.8 % (ref 34.0–46.6)
Hemoglobin: 13.1 g/dL (ref 11.1–15.9)
Immature Grans (Abs): 0 x10E3/uL (ref 0.0–0.1)
Immature Granulocytes: 0 %
Lymphocytes Absolute: 2.2 x10E3/uL (ref 0.7–3.1)
Lymphs: 31 %
MCH: 31 pg (ref 26.6–33.0)
MCHC: 32.9 g/dL (ref 31.5–35.7)
MCV: 94 fL (ref 79–97)
Monocytes Absolute: 0.7 x10E3/uL (ref 0.1–0.9)
Monocytes: 10 %
Neutrophils Absolute: 4 x10E3/uL (ref 1.4–7.0)
Neutrophils: 55 %
Platelets: 208 x10E3/uL (ref 150–450)
RBC: 4.22 x10E6/uL (ref 3.77–5.28)
RDW: 12.6 % (ref 11.7–15.4)
WBC: 7.2 x10E3/uL (ref 3.4–10.8)

## 2023-12-26 LAB — CMP14+EGFR
ALT: 16 IU/L (ref 0–32)
AST: 23 IU/L (ref 0–40)
Albumin: 4.4 g/dL (ref 3.7–4.7)
Alkaline Phosphatase: 46 IU/L — ABNORMAL LOW (ref 48–129)
BUN/Creatinine Ratio: 16 (ref 12–28)
BUN: 16 mg/dL (ref 8–27)
Bilirubin Total: 2.1 mg/dL — ABNORMAL HIGH (ref 0.0–1.2)
CO2: 26 mmol/L (ref 20–29)
Calcium: 10.4 mg/dL — ABNORMAL HIGH (ref 8.7–10.3)
Chloride: 99 mmol/L (ref 96–106)
Creatinine, Ser: 0.97 mg/dL (ref 0.57–1.00)
Globulin, Total: 2.6 g/dL (ref 1.5–4.5)
Glucose: 103 mg/dL — ABNORMAL HIGH (ref 70–99)
Potassium: 4.4 mmol/L (ref 3.5–5.2)
Sodium: 138 mmol/L (ref 134–144)
Total Protein: 7 g/dL (ref 6.0–8.5)
eGFR: 59 mL/min/1.73 — ABNORMAL LOW

## 2023-12-26 LAB — LIPID PANEL
Chol/HDL Ratio: 2.7 ratio (ref 0.0–4.4)
Cholesterol, Total: 111 mg/dL (ref 100–199)
HDL: 41 mg/dL (ref >39)
LDL Chol Calc (NIH): 44 mg/dL (ref 0–99)
Triglycerides: 150 mg/dL — ABNORMAL HIGH (ref 0–149)
VLDL Cholesterol Cal: 26 mg/dL (ref 5–40)

## 2023-12-26 LAB — BAYER DCA HB A1C WAIVED: HB A1C (BAYER DCA - WAIVED): 6 % — ABNORMAL HIGH (ref 4.8–5.6)

## 2023-12-26 LAB — TSH: TSH: 0.894 u[IU]/mL (ref 0.450–4.500)

## 2023-12-26 MED ORDER — VALACYCLOVIR HCL 1 G PO TABS: 2000.0000 mg | ORAL_TABLET | Freq: Two times a day (BID) | ORAL | 2 refills | Status: AC

## 2023-12-26 MED ORDER — DOCOSANOL 10 % EX CREA: TOPICAL_CREAM | CUTANEOUS | 0 refills | Status: AC

## 2023-12-26 MED ORDER — APIXABAN 5 MG PO TABS: 5.0000 mg | ORAL_TABLET | Freq: Two times a day (BID) | ORAL | 1 refills | Status: AC

## 2023-12-26 NOTE — Patient Instructions (Signed)
Cold Sore  A cold sore, also called a fever blister, is a small, fluid-filled sore that forms inside the mouth or on the lips, gums, nose, chin, or cheeks. Cold sores can spread to other parts of the body, such as the eyes, fingers, or genitals. Cold sores can spread from person to person (are contagious) until the sores crust over completely. Most cold sores go away within 2 weeks. What are the causes? Cold sores are caused by an infection from a common type of herpes simplex virus (HSV-1). HSV-1 is closely related to the HSV-2virus, which is the virus that causes genital herpes, but these viruses are not the same. Once a person is infected with HSV-1, the virus remains permanently in the body. HSV-1 is spread from person to person through close contact, such as through kissing, touching the affected area, or sharing personal items such as lip balm, razors, a drinking glass, or eating utensils. What increases the risk? You are more likely to develop this condition if you: Are tired, stressed, or sick. Are menstruating. Are pregnant. Take certain medicines. Are exposed to cold weather or too much sun. What are the signs or symptoms? Symptoms of a cold sore outbreak go through different stages. These are the stages of a cold sore: Tingling, itching, or burning is felt 1-2 days before the outbreak. Fluid-filled blisters appear on the lips, inside the mouth, on the nose, or on the cheeks. The blisters start to ooze clear fluid. The blisters dry up, and a yellow crust appears in their place. The crust falls off. In some cases, other symptoms can develop during a cold sore outbreak. These can include: Fever. Sore throat. Headache. Muscle aches. Swollen neck glands. How is this diagnosed? This condition is diagnosed based on your medical history and a physical exam. Your health care provider may do a blood test or may swab some fluid from your sore and then examine the swab in the lab. How is  this treated? There is no cure for cold sores or HSV-1. There is also no vaccine for HSV-1. Most cold sores go away on their own without treatment within 2 weeks. Medicines cannot make the infection go away, but your health care provider may prescribe medicines to: Help relieve some of the pain associated with the sores. Work to stop the virus from multiplying. Shorten healing time. Medicines may be in the form of creams, gels, pills, or a shot. Follow these instructions at home: Medicines Take or apply over-the-counter and prescription medicines only as told by your health care provider. Use a cotton-tip swab to apply creams or gels to your sores. Ask your health care provider if you can take lysine supplements. Research has found that lysine may help heal the cold sore faster and prevent outbreaks. Sore care  Do not touch the sores or pick the scabs. Wash your hands often with soap and water for at least 20 seconds. Do not touch your eyes without washing your hands first. Keep the sores clean and dry. If directed, put ice on the sores. To do this: Put ice in a plastic bag. Place a towel between your skin and the bag. Leave the ice on for 20 minutes, 2-3 times a day. Remove the ice if your skin turns bright red. This is very important. If you cannot feel pain, heat, or cold, you have a greater risk of damage to the area. Eating and drinking Eat a soft, bland diet. Avoid eating hot, cold, or salty foods.  Use a straw if it hurts to drink out of a glass. Eat foods that are rich in lysine, such as meat, fish, and dairy products. Avoid sugary foods, chocolates, nuts, and grains. These foods are rich in a nutrient called arginine, which can cause the virus to multiply. Lifestyle Do not kiss, have oral sex, or share personal items until your sores heal. Stress, poor sleep, and being out in the sun can trigger outbreaks. Make sure you: Do activities that help you relax, such as deep breathing  exercises or meditation. Get enough sleep. Apply sunscreen on your lips before you go out in the sun. Contact a health care provider if: You have symptoms for more than 2 weeks. You have pus coming from the sores. You have redness that is spreading. You have pain or irritation in your eye. You get sores on your genitals. Your sores do not heal within 2 weeks. You have frequent cold sore outbreaks. Get help right away if: You have a fever and your symptoms suddenly get worse. You have a headache and confusion. You have tiredness (fatigue) or loss of appetite. You have a stiff neck or sensitivity to light. Summary A cold sore, also called a fever blister, is a small, fluid-filled sore that forms inside the mouth or on the lips, gums, nose, chin, or cheeks. Most cold sores go away on their own without treatment within 2 weeks. Your health care provider may prescribe medicines to help relieve some of the pain, work to stop the virus from multiplying, and shorten healing time. Wash your hands often with soap and water for at least 20 seconds. Do not touch your eyes without washing your hands first. Do not kiss, have oral sex, or share personal items until your sores heal. Contact a health care provider if your sores do not heal within 2 weeks. This information is not intended to replace advice given to you by your health care provider. Make sure you discuss any questions you have with your health care provider. Document Revised: 12/16/2020 Document Reviewed: 12/16/2020 Elsevier Patient Education  2024 [employer].

## 2023-12-26 NOTE — Progress Notes (Signed)
 Subjective:    Patient ID: Autumn Moyer, female    DOB: [birth year], 83 y.o.   MRN: [id number]  Chief Complaint  Patient presents with   Medical Management of Chronic Issues   Pt presents to the offic today for CPE .   She is followed by Cardiologists  for A Fib, Aortic stenosis. She is currently on Eliquis  5 mg BID.    She had aortic valve replacement on 12/14/21.  Hypertension This is a chronic problem. The current episode started more than 1 year ago. The problem has been resolved since onset. The problem is controlled. Associated symptoms include malaise/fatigue (some days). Pertinent negatives include no peripheral edema or shortness of breath. Risk factors for coronary artery disease include dyslipidemia, obesity and sedentary lifestyle. The current treatment provides moderate improvement. Identifiable causes of hypertension include a thyroid  problem.  Gastroesophageal Reflux She complains of belching, heartburn and a hoarse voice. This is a chronic problem. The current episode started more than 1 year ago. The problem occurs occasionally. The symptoms are aggravated by certain foods. Associated symptoms include fatigue. Risk factors include obesity. She has tried a PPI for the symptoms. The treatment provided moderate relief.  Thyroid  Problem Presents for follow-up visit. Symptoms include dry skin, fatigue and hoarse voice. Patient reports no constipation, depressed mood or diarrhea. The symptoms have been stable.  Anemia Presents for follow-up visit. Symptoms include malaise/fatigue (some days).  Diabetes She presents for her follow-up diabetic visit. Diabetes type: prediabetes. Associated symptoms include fatigue. Risk factors for coronary artery disease include dyslipidemia, diabetes mellitus, hypertension and sedentary lifestyle. She is following a generally healthy diet. Her overall blood glucose range is 110-130 mg/dl.      Review of Systems  Constitutional:  Positive for  fatigue and malaise/fatigue (some days).  HENT:  Positive for hoarse voice.   Respiratory:  Negative for shortness of breath.   Gastrointestinal:  Positive for heartburn. Negative for constipation and diarrhea.  All other systems reviewed and are negative.      Objective:   Physical Exam Vitals reviewed.  Constitutional:      General: She is not in acute distress.    Appearance: She is well-developed.  HENT:     Head: Normocephalic and atraumatic.     Right Ear: Tympanic membrane normal.     Left Ear: Tympanic membrane normal.  Eyes:     Pupils: Pupils are equal, round, and reactive to light.  Neck:     Thyroid : No thyromegaly.  Cardiovascular:     Rate and Rhythm: Normal rate. Rhythm irregular.     Heart sounds: Normal heart sounds. No murmur heard. Pulmonary:     Effort: Pulmonary effort is normal. No respiratory distress.     Breath sounds: Normal breath sounds. No wheezing.  Abdominal:     General: Bowel sounds are normal. There is no distension.     Palpations: Abdomen is soft.     Tenderness: There is no abdominal tenderness.  Musculoskeletal:        General: No tenderness. Normal range of motion.     Cervical back: Normal range of motion and neck supple.  Skin:    General: Skin is warm and dry.  Neurological:     Mental Status: She is alert and oriented to person, place, and time.     Cranial Nerves: No cranial nerve deficit.     Deep Tendon Reflexes: Reflexes are normal and symmetric.  Psychiatric:  Behavior: Behavior normal.        Thought Content: Thought content normal.        Judgment: Judgment normal.       BP 117/72   Pulse (!) 59   Temp 98 F (36.7 C)   Ht 5' 4 (1.626 m)   Wt 137 lb (62.1 kg)   SpO2 97%   BMI 23.52 kg/m      Assessment & Plan:  Autumn Moyer comes in today with chief complaint of Medical Management of Chronic Issues   Diagnosis and orders addressed: 1. Atrial fibrillation, unspecified type (HCC) - apixaban   (ELIQUIS ) 5 MG TABS tablet; Take 1 tablet (5 mg total) by mouth 2 (two) times daily.  Dispense: 180 tablet; Refill: 1 - CMP14+EGFR - CBC with Differential/Platelet  2. Encounter for immunization - Flu vaccine HIGH DOSE PF(Fluzone Trivalent)  3. Hypothyroidism, unspecified type - CMP14+EGFR - CBC with Differential/Platelet - TSH  4. Essential hypertension, benign  - CMP14+EGFR - CBC with Differential/Platelet  5. Gastroesophageal reflux disease with esophagitis, unspecified whether hemorrhage - CMP14+EGFR - CBC with Differential/Platelet  6. Iron deficiency anemia, unspecified iron deficiency anemia type - CMP14+EGFR - CBC with Differential/Platelet  7. Overweight (BMI 25.0-29.9) - CMP14+EGFR - CBC with Differential/Platelet  8. Prediabetes - CMP14+EGFR - CBC with Differential/Platelet - Bayer DCA Hb A1c Waived  9. Oral herpes Will Valtrex as needed for outbreaks  - valACYclovir (VALTREX) 1000 MG tablet; Take 2 tablets (2,000 mg total) by mouth 2 (two) times daily for 10 days.  Dispense: 4 tablet; Refill: 2 - CMP14+EGFR - CBC with Differential/Platelet  10. Annual physical exam (Primary) - CMP14+EGFR - CBC with Differential/Platelet - Lipid panel - TSH - Bayer DCA Hb A1c Waived   Labs pending Continue current medications  Health Maintenance reviewed Diet and exercise encouraged  Follow up plan: 6 months   Bari Learn, FNP

## 2023-12-28 ENCOUNTER — Ambulatory Visit: Payer: Self-pay | Admitting: Family

## 2023-12-28 DIAGNOSIS — R17 Unspecified jaundice: Secondary | ICD-10-CM

## 2024-01-05 ENCOUNTER — Ambulatory Visit (HOSPITAL_COMMUNITY)
Admission: RE | Admit: 2024-01-05 | Discharge: 2024-01-05 | Disposition: A | Discharge status: Home / Self Care | Source: Ambulatory Visit | Attending: Family | Admitting: Family

## 2024-01-05 DIAGNOSIS — H02831 Dermatochalasis of right upper eyelid: Secondary | ICD-10-CM | POA: Diagnosis not present

## 2024-01-05 DIAGNOSIS — H02834 Dermatochalasis of left upper eyelid: Secondary | ICD-10-CM | POA: Diagnosis not present

## 2024-01-05 DIAGNOSIS — H52203 Unspecified astigmatism, bilateral: Secondary | ICD-10-CM | POA: Diagnosis not present

## 2024-01-05 DIAGNOSIS — H524 Presbyopia: Secondary | ICD-10-CM | POA: Diagnosis not present

## 2024-01-05 DIAGNOSIS — K8020 Calculus of gallbladder without cholecystitis without obstruction: Secondary | ICD-10-CM | POA: Diagnosis not present

## 2024-01-05 DIAGNOSIS — D3131 Benign neoplasm of right choroid: Secondary | ICD-10-CM | POA: Diagnosis not present

## 2024-01-05 DIAGNOSIS — H04123 Dry eye syndrome of bilateral lacrimal glands: Secondary | ICD-10-CM | POA: Diagnosis not present

## 2024-01-05 DIAGNOSIS — K7689 Other specified diseases of liver: Secondary | ICD-10-CM | POA: Diagnosis not present

## 2024-01-05 DIAGNOSIS — R17 Unspecified jaundice: Secondary | ICD-10-CM | POA: Insufficient documentation

## 2024-01-05 DIAGNOSIS — H43813 Vitreous degeneration, bilateral: Secondary | ICD-10-CM | POA: Diagnosis not present

## 2024-01-08 ENCOUNTER — Other Ambulatory Visit: Payer: Self-pay | Admitting: Family

## 2024-01-08 ENCOUNTER — Ambulatory Visit: Payer: Self-pay | Admitting: Family

## 2024-01-08 DIAGNOSIS — R932 Abnormal findings on diagnostic imaging of liver and biliary tract: Secondary | ICD-10-CM

## 2024-01-08 DIAGNOSIS — K760 Fatty (change of) liver, not elsewhere classified: Secondary | ICD-10-CM | POA: Insufficient documentation

## 2024-01-12 ENCOUNTER — Other Ambulatory Visit: Payer: Self-pay | Admitting: Family

## 2024-02-20 ENCOUNTER — Other Ambulatory Visit: Payer: Self-pay | Admitting: Family

## 2024-02-20 DIAGNOSIS — I4891 Unspecified atrial fibrillation: Secondary | ICD-10-CM

## 2024-02-20 DIAGNOSIS — I10 Essential (primary) hypertension: Secondary | ICD-10-CM

## 2024-03-05 ENCOUNTER — Other Ambulatory Visit: Payer: Self-pay | Admitting: *Deleted

## 2024-03-05 MED ORDER — ALENDRONATE SODIUM 70 MG PO TABS: 70.0000 mg | ORAL_TABLET | ORAL | 1 refills | Status: AC

## 2024-03-28 ENCOUNTER — Other Ambulatory Visit: Payer: Self-pay | Admitting: Family

## 2024-04-16 ENCOUNTER — Ambulatory Visit

## 2024-04-16 VITALS — BP 117/72 | HR 59 | Ht 64.0 in | Wt 137.0 lb

## 2024-04-16 DIAGNOSIS — Z0000 Encounter for general adult medical examination without abnormal findings: Secondary | ICD-10-CM

## 2024-04-16 NOTE — Progress Notes (Signed)
 "  Chief Complaint  Patient presents with   Medicare Wellness     Subjective:   Autumn Moyer is a 84 y.o. female who presents for a Medicare Annual Wellness Visit.  Visit info / Clinical Intake: Medicare Wellness Visit Type:: Subsequent Annual Wellness Visit Persons participating in visit and providing information:: patient Medicare Wellness Visit Mode:: Telephone If telephone:: video declined Since this visit was completed virtually, some vitals may be partially provided or unavailable. Missing vitals are due to the limitations of the virtual format.: Unable to obtain vitals - no equipment If Telephone or Video please confirm:: I connected with patient using audio/video enable telemedicine. I verified patient identity with two identifiers, discussed telehealth limitations, and patient agreed to proceed. Patient Location:: home Provider Location:: office Interpreter Needed?: No Pre-visit prep was completed: yes AWV questionnaire completed by patient prior to visit?: no Living arrangements:: lives with spouse/significant other Patient's Overall Health Status Rating: very good Typical amount of pain: none Does pain affect daily life?: no Are you currently prescribed opioids?: no  Dietary Habits and Nutritional Risks How many meals a day?: 3 Eats fruit and vegetables daily?: yes Most meals are obtained by: preparing own meals In the last 2 weeks, have you had any of the following?: none Diabetic:: no  Functional Status Activities of Daily Living (to include ambulation/medication): Independent Ambulation: Independent Medication Administration: Independent Home Management (perform basic housework or laundry): Independent Manage your own finances?: yes Primary transportation is: driving Concerns about vision?: no *vision screening is required for WTM* (last ov 2025) Concerns about hearing?: (!) yes Uses hearing aids?: no  Fall Screening Falls in the past year?: 1 Number of  falls in past year: 0 Was there an injury with Fall?: 1 Fall Risk Category Calculator: 2 Patient Fall Risk Level: Moderate Fall Risk  Fall Risk Patient at Risk for Falls Due to: Impaired balance/gait; Impaired mobility Fall risk Follow up: Falls evaluation completed; Education provided  Home and Transportation Safety: All rugs have non-skid backing?: yes All stairs or steps have railings?: yes Grab bars in the bathtub or shower?: yes Have non-skid surface in bathtub or shower?: yes Good home lighting?: yes Regular seat belt use?: yes Hospital stays in the last year:: (!) yes How many hospital stays:: 2 Reason: hip replaceemnt due to fall  Cognitive Assessment Difficulty concentrating, remembering, or making decisions? : no Will 6CIT or Mini Cog be Completed: yes What year is it?: 0 points What month is it?: 0 points Give patient an address phrase to remember (5 components): [address] [place] what time is it?: 0 points Count backwards from 20 to 1: 0 points Say the months of the year in reverse: 0 points Repeat the address phrase from earlier: 0 points 6 CIT Score: 0 points  Advance Directives (For Healthcare) Does Patient Have a Medical Advance Directive?: Yes Does patient want to make changes to medical advance directive?: No - Patient declined Type of Advance Directive: Healthcare Power of [place]; Living will Copy of Healthcare Power of Attorney in Chart?: Yes - validated most recent copy scanned in chart (See row information) Copy of Living Will in Chart?: Yes - validated most recent copy scanned in chart (See row information)  Reviewed/Updated  Reviewed/Updated: Reviewed All (Medical, Surgical, Family, Medications, Allergies, Care Teams, Patient Goals)    Allergies (verified) Noroxin [norfloxacin], Augmentin  [amoxicillin -pot clavulanate], Rosuvastatin , and Peppermint spirit (bulk)   Current Medications (verified) Outpatient Encounter  Medications as of 04/16/2024  Medication Sig  acetaminophen  (TYLENOL ) 500 MG tablet Take 1,000 mg by mouth daily.   alendronate  (FOSAMAX ) 70 MG tablet Take 1 tablet (70 mg total) by mouth once a week. Take with a full glass of water  on an empty stomach.   apixaban  (ELIQUIS ) 5 MG TABS tablet Take 1 tablet (5 mg total) by mouth 2 (two) times daily.   atorvastatin  (LIPITOR) 20 MG tablet Take 1 tablet (20 mg total) by mouth daily.   benazepril -hydrochlorthiazide (LOTENSIN  HCT) 20-25 MG tablet Take 1 tablet by mouth once daily   calcium  carbonate (OSCAL) 1500 (600 Ca) MG TABS tablet Take 600 mg by mouth 2 (two) times daily with a meal.   carboxymethylcellulose (REFRESH TEARS) 0.5 % SOLN Place 2 drops into both eyes in the morning.   chlorhexidine  (HIBICLENS ) 4 % external liquid Apply 15 mLs (1 Application total) topically as directed for 30 doses. Use as directed daily for 5 days every other week for 6 weeks.   cholecalciferol (VITAMIN D3) 25 MCG (1000 UNIT) tablet Take 3,000 Units by mouth daily.   digoxin  (LANOXIN ) 0.125 MG tablet Take 1 tablet by mouth once daily   Docosanol  10 % CREA Small amount on the lips TID  PRN   furosemide  (LASIX ) 20 MG tablet Take 1 tablet (20 mg total) by mouth daily as needed for edema or fluid.   levocetirizine (XYZAL ) 5 MG tablet TAKE 1 TABLET BY MOUTH ONCE DAILY IN THE EVENING   levothyroxine  (SYNTHROID ) 88 MCG tablet TAKE 1 TABLET BY MOUTH ONCE DAILY BEFORE BREAKFAST   metoprolol  tartrate (LOPRESSOR ) 50 MG tablet Take 1 tablet by mouth twice daily   mometasone  (ELOCON ) 0.1 % cream Apply 1 Application topically daily.   Multiple Vitamin (MULTIVITAMIN WITH MINERALS) TABS tablet Take 1 tablet by mouth at bedtime.   tacrolimus  (PROTOPIC ) 0.1 % ointment Apply topically in the morning and at bedtime.   No facility-administered encounter medications on file as of 04/16/2024.    History: Past Medical History:  Diagnosis Date   Arthritis    Atrial fibrillation,  persistent (HCC)    Complication of anesthesia    Dysrhythmia    A. Fib   GERD (gastroesophageal reflux disease)    Related to Hiatal Hernia   Headache    Localized in bilateral eyes   Heart murmur    TAVR 2023   Hiatal hernia    Hypertension    Hypothyroidism    PONV (postoperative nausea and vomiting)    Pre-diabetes    S/P TAVR (transcatheter aortic valve replacement) 12/14/2021   s/p TAVR with a 23mm Edwards S3UR via the TF approach by Dr. Wendel & Dr. Maryjane   Severe aortic stenosis    Squamous cell carcinoma of skin 01/16/2018   in situ-right knee post (txpbx)   Thyroid  disease    Past Surgical History:  Procedure Laterality Date   ABDOMINAL AORTOGRAM N/A 09/22/2021   Procedure: ABDOMINAL AORTOGRAM;  Surgeon: Wendel Lurena POUR, MD;  Location: [facility];  Service: Cardiovascular;  Laterality: N/A;   ESOPHAGOGASTRODUODENOSCOPY N/A 01/09/2023   Procedure: ESOPHAGOGASTRODUODENOSCOPY (EGD);  Surgeon: Shyrl Linnie KIDD, MD;  Location: [facility] OR;  Service: Thoracic;  Laterality: N/A;   EYE SURGERY     INTRAOPERATIVE TRANSTHORACIC ECHOCARDIOGRAM N/A 12/14/2021   Procedure: INTRAOPERATIVE TRANSTHORACIC ECHOCARDIOGRAM;  Surgeon: Wendel Lurena POUR, MD;  Location: [facility] OR;  Service: Open Heart Surgery;  Laterality: N/A;   MULTIPLE EXTRACTIONS WITH ALVEOLOPLASTY N/A 12/02/2021   Procedure: MULTIPLE EXTRACTION WITH ALVEOLOPLASTY;  Surgeon: Celena Lum NOVAK,  DMD;  Location: [facility] OR;  Service: Dentistry;  Laterality: N/A;   RIGHT HEART CATH AND CORONARY/GRAFT ANGIOGRAPHY N/A 09/22/2021   Procedure: RIGHT HEART CATH AND CORONARY/GRAFT ANGIOGRAPHY;  Surgeon: Wendel Lurena POUR, MD;  Location: [facility];  Service: Cardiovascular;  Laterality: N/A;   TOE SURGERY Right    Hammer Toes   TOTAL HIP ARTHROPLASTY Left 05/13/2023   Procedure: TOTAL HIP ARTHROPLASTY ANTERIOR APPROACH;  Surgeon: Ernie Cough, MD;  Location: [facility] OR;  Service: Orthopedics;  Laterality: Left;   TRANSCATHETER  AORTIC VALVE REPLACEMENT, TRANSFEMORAL N/A 12/14/2021   Procedure: Transcatheter Aortic Valve Replacement, Transfemoral using Edwards 23 MM SAPIEN 3 Ultra;  Surgeon: Thukkani, Arun K, MD;  Location: [facility] OR;  Service: Open Heart Surgery;  Laterality: N/A;  Transfemoral approach   TUBAL LIGATION  1986   XI ROBOTIC ASSISTED HIATAL HERNIA REPAIR N/A 01/09/2023   Procedure: XI ROBOTIC ASSISTED HIATAL HERNIA REPAIR WITH FUNDOPLICATION;  Surgeon: Shyrl Linnie KIDD, MD;  Location: [facility] OR;  Service: Thoracic;  Laterality: N/A;   Family History  Problem Relation Age of Onset   Migraines Mother    Hypertension Mother    Hyperlipidemia Mother    Heart failure Mother        Age 58   Hyperlipidemia Father    Hypertension Father    Heart disease Father        died of MI after developoing cancer.   Lung cancer Father    Hypertension Sister    Breast cancer Paternal Aunt    Migraines Maternal Grandfather    Migraines Daughter    Breast cancer Daughter    Social History   Occupational History   Not on file  Tobacco Use   Smoking status: Never    Passive exposure: Past   Smokeless tobacco: Never  Vaping Use   Vaping status: Never Used  Substance and Sexual Activity   Alcohol use: No    Alcohol/week: 0.0 standard drinks of alcohol   Drug use: No   Sexual activity: Not Currently    Partners: Male   Tobacco Counseling Counseling given: Yes  SDOH Screenings   Food Insecurity: No Food Insecurity (04/16/2024)  Housing: Low Risk (04/16/2024)  Transportation Needs: No Transportation Needs (04/16/2024)  Utilities: Not At Risk (04/16/2024)  Alcohol Screen: Low Risk (03/27/2023)  Depression (PHQ2-9): Low Risk (04/16/2024)  Financial Resource Strain: Low Risk (03/27/2023)  Physical Activity: Sufficiently Active (04/16/2024)  Social Connections: Moderately Integrated (04/16/2024)  Stress: No Stress Concern Present (04/16/2024)  Tobacco Use: Low Risk (04/16/2024)  Health Literacy: Adequate Health  Literacy (04/16/2024)   See flowsheets for full screening details  Depression Screen PHQ 2 & 9 Depression Scale- Over the past 2 weeks, how often have you been bothered by any of the following problems? Little interest or pleasure in doing things: 0 Feeling down, depressed, or hopeless (PHQ Adolescent also includes...irritable): 0 PHQ-2 Total Score: 0     Goals Addressed             This Visit's Progress    Remain active and independent   On track            Objective:    Today's Vitals   04/16/24 1104  BP: 117/72  Pulse: (!) 59  Weight: 137 lb (62.1 kg)  Height: 5' 4 (1.626 m)   Body mass index is 23.52 kg/m.  Hearing/Vision screen No results found. Immunizations and Health Maintenance Health Maintenance  Topic Date Due   COVID-19 Vaccine (4 -  2025-26 season) 11/20/2023   Medicare Annual Wellness (AWV)  04/16/2025   Bone Density Scan  06/12/2025   DTaP/Tdap/Td (3 - Td or Tdap) 05/27/2026   Pneumococcal Vaccine: 50+ Years  Completed   Influenza Vaccine  Completed   Zoster Vaccines- Shingrix  Completed   Meningococcal B Vaccine  Aged Out   Colonoscopy  Discontinued   Hepatitis C Screening  Discontinued        Assessment/Plan:  This is a routine wellness examination for [place].  Patient Care Team: Lavell Bari LABOR, FNP as PCP - General (Family Medicine) Lavona Agent, MD as PCP - Cardiology (Cardiology) Skeet Juliene SAUNDERS, DO as Consulting Physician (Neurology)  I have personally reviewed and noted the following in the patients chart:   Medical and social history Use of alcohol, tobacco or illicit drugs  Current medications and supplements including opioid prescriptions. Functional ability and status Nutritional status Physical activity Advanced directives List of other physicians Hospitalizations, surgeries, and ER visits in previous 12 months Vitals Screenings to include cognitive, depression, and falls Referrals and appointments  No orders  of the defined types were placed in this encounter.  In addition, I have reviewed and discussed with patient certain preventive protocols, quality metrics, and best practice recommendations. A written personalized care plan for preventive services as well as general preventive health recommendations were provided to patient.   Ozie Ned, CMA   04/16/2024   Return in 1 year (on 04/16/2025).  After Visit Summary: (MyChart) Due to this being a telephonic visit, the after visit summary with patients personalized plan was offered to patient via MyChart   Nurse Notes: n/a "

## 2024-04-16 NOTE — Patient Instructions (Signed)
 Autumn Moyer,  Thank you for taking the time for your Medicare Wellness Visit. I appreciate your continued commitment to your health goals. Please review the care plan we discussed, and feel free to reach out if I can assist you further.  Please note that Annual Wellness Visits do not include a physical exam. Some assessments may be limited, especially if the visit was conducted virtually. If needed, we may recommend an in-person follow-up with your provider.  Ongoing Care Seeing your primary care provider every 3 to 6 months helps us  monitor your health and provide consistent, personalized care.   Referrals If a referral was made during today's visit and you haven't received any updates within two weeks, please contact the referred provider directly to check on the status.  Recommended Screenings:  Health Maintenance  Topic Date Due   COVID-19 Vaccine (4 - 2025-26 season) 11/20/2023   Medicare Annual Wellness Visit  04/16/2025   Osteoporosis screening with Bone Density Scan  06/12/2025   DTaP/Tdap/Td vaccine (3 - Td or Tdap) 05/27/2026   Pneumococcal Vaccine for age over 59  Completed   Flu Shot  Completed   Zoster (Shingles) Vaccine  Completed   Meningitis B Vaccine  Aged Out   Colon Cancer Screening  Discontinued   Hepatitis C Screening  Discontinued       04/16/2024   10:50 AM  Advanced Directives  Does Patient Have a Medical Advance Directive? Yes  Type of Estate Agent of [place];Living will  Copy of Healthcare Power of Attorney in Chart? Yes - validated most recent copy scanned in chart (See row information)    Vision: Annual vision screenings are recommended for early detection of glaucoma, cataracts, and diabetic retinopathy. These exams can also reveal signs of chronic conditions such as diabetes and high blood pressure.  Dental: Annual dental screenings help detect early signs of oral cancer, gum disease, and other conditions linked to overall  health, including heart disease and diabetes.  Please see the attached documents for additional preventive care recommendations.

## 2024-06-25 ENCOUNTER — Ambulatory Visit: Payer: Self-pay | Admitting: Family

## 2024-07-10 ENCOUNTER — Ambulatory Visit: Admitting: Cardiology

## 2025-04-17 ENCOUNTER — Ambulatory Visit
